# Patient Record
Sex: Female | Born: 1960 | Race: White | Hispanic: No | Smoking: Current every day smoker
Health system: Southern US, Community
[De-identification: ages and names within clinical notes are randomized; demographics above are authoritative.]

## PROBLEM LIST (undated history)

## (undated) ENCOUNTER — Emergency Department (HOSPITAL_COMMUNITY): Admission: EM | Source: Ambulatory Visit

## (undated) DIAGNOSIS — M549 Dorsalgia, unspecified: Secondary | ICD-10-CM

## (undated) DIAGNOSIS — G43909 Migraine, unspecified, not intractable, without status migrainosus: Secondary | ICD-10-CM

## (undated) DIAGNOSIS — R519 Headache, unspecified: Secondary | ICD-10-CM

## (undated) DIAGNOSIS — F32A Depression, unspecified: Secondary | ICD-10-CM

## (undated) DIAGNOSIS — M542 Cervicalgia: Secondary | ICD-10-CM

## (undated) DIAGNOSIS — Z87898 Personal history of other specified conditions: Secondary | ICD-10-CM

## (undated) DIAGNOSIS — F319 Bipolar disorder, unspecified: Secondary | ICD-10-CM

## (undated) DIAGNOSIS — F329 Major depressive disorder, single episode, unspecified: Secondary | ICD-10-CM

## (undated) DIAGNOSIS — I10 Essential (primary) hypertension: Secondary | ICD-10-CM

## (undated) DIAGNOSIS — F419 Anxiety disorder, unspecified: Secondary | ICD-10-CM

## (undated) DIAGNOSIS — G8929 Other chronic pain: Secondary | ICD-10-CM

## (undated) DIAGNOSIS — Z8659 Personal history of other mental and behavioral disorders: Secondary | ICD-10-CM

## (undated) DIAGNOSIS — F410 Panic disorder [episodic paroxysmal anxiety] without agoraphobia: Secondary | ICD-10-CM

## (undated) DIAGNOSIS — R51 Headache: Secondary | ICD-10-CM

## (undated) DIAGNOSIS — K219 Gastro-esophageal reflux disease without esophagitis: Secondary | ICD-10-CM

## (undated) DIAGNOSIS — G5603 Carpal tunnel syndrome, bilateral upper limbs: Secondary | ICD-10-CM

## (undated) DIAGNOSIS — K589 Irritable bowel syndrome without diarrhea: Secondary | ICD-10-CM

## (undated) HISTORY — PX: DILATION AND CURETTAGE OF UTERUS: SHX78

## (undated) HISTORY — PX: CARPAL TUNNEL RELEASE: SHX101

## (undated) HISTORY — PX: ABDOMINAL HYSTERECTOMY: SHX81

## (undated) HISTORY — PX: APPENDECTOMY: SHX54

## (undated) HISTORY — PX: ABDOMINAL SURGERY: SHX537

## (undated) HISTORY — PX: HERNIA REPAIR: SHX51

## (undated) HISTORY — PX: TUBAL LIGATION: SHX77

## (undated) HISTORY — PX: CHOLECYSTECTOMY: SHX55

---

## 2003-08-21 ENCOUNTER — Emergency Department (HOSPITAL_COMMUNITY): Admission: EM | Admit: 2003-08-21 | Discharge: 2003-08-21 | Payer: Self-pay | Admitting: Emergency Medicine

## 2004-07-28 ENCOUNTER — Ambulatory Visit (HOSPITAL_COMMUNITY): Admission: RE | Admit: 2004-07-28 | Discharge: 2004-07-28 | Payer: Self-pay | Admitting: Family Medicine

## 2004-08-06 ENCOUNTER — Ambulatory Visit (HOSPITAL_COMMUNITY): Admission: RE | Admit: 2004-08-06 | Discharge: 2004-08-06 | Payer: Self-pay | Admitting: Family Medicine

## 2004-09-01 ENCOUNTER — Other Ambulatory Visit: Admission: RE | Admit: 2004-09-01 | Discharge: 2004-09-01 | Payer: Self-pay | Admitting: Obstetrics and Gynecology

## 2004-09-21 ENCOUNTER — Inpatient Hospital Stay (HOSPITAL_COMMUNITY): Admission: RE | Admit: 2004-09-21 | Discharge: 2004-09-23 | Payer: Self-pay | Admitting: Obstetrics & Gynecology

## 2004-12-29 ENCOUNTER — Emergency Department (HOSPITAL_COMMUNITY): Admission: EM | Admit: 2004-12-29 | Discharge: 2004-12-29 | Payer: Self-pay | Admitting: Emergency Medicine

## 2006-04-28 ENCOUNTER — Emergency Department (HOSPITAL_COMMUNITY): Admission: EM | Admit: 2006-04-28 | Discharge: 2006-04-28 | Payer: Self-pay | Admitting: Emergency Medicine

## 2006-08-04 ENCOUNTER — Emergency Department (HOSPITAL_COMMUNITY): Admission: EM | Admit: 2006-08-04 | Discharge: 2006-08-04 | Payer: Self-pay | Admitting: Emergency Medicine

## 2007-04-21 ENCOUNTER — Emergency Department (HOSPITAL_COMMUNITY): Admission: EM | Admit: 2007-04-21 | Discharge: 2007-04-21 | Payer: Self-pay | Admitting: Emergency Medicine

## 2008-02-03 ENCOUNTER — Emergency Department (HOSPITAL_COMMUNITY): Admission: EM | Admit: 2008-02-03 | Discharge: 2008-02-03 | Payer: Self-pay | Admitting: Emergency Medicine

## 2008-02-18 ENCOUNTER — Emergency Department (HOSPITAL_COMMUNITY): Admission: EM | Admit: 2008-02-18 | Discharge: 2008-02-18 | Payer: Self-pay | Admitting: Emergency Medicine

## 2008-11-17 ENCOUNTER — Emergency Department (HOSPITAL_COMMUNITY): Admission: EM | Admit: 2008-11-17 | Discharge: 2008-11-17 | Payer: Self-pay | Admitting: Emergency Medicine

## 2008-12-22 ENCOUNTER — Emergency Department (HOSPITAL_COMMUNITY): Admission: EM | Admit: 2008-12-22 | Discharge: 2008-12-22 | Payer: Self-pay | Admitting: Emergency Medicine

## 2009-02-02 ENCOUNTER — Ambulatory Visit: Payer: Self-pay | Admitting: Orthopedic Surgery

## 2009-02-02 DIAGNOSIS — R209 Unspecified disturbances of skin sensation: Secondary | ICD-10-CM

## 2009-02-11 ENCOUNTER — Encounter: Payer: Self-pay | Admitting: Orthopedic Surgery

## 2009-02-11 ENCOUNTER — Encounter (INDEPENDENT_AMBULATORY_CARE_PROVIDER_SITE_OTHER): Payer: Self-pay | Admitting: *Deleted

## 2009-02-18 ENCOUNTER — Encounter: Payer: Self-pay | Admitting: Orthopedic Surgery

## 2009-03-04 ENCOUNTER — Ambulatory Visit: Payer: Self-pay | Admitting: Orthopedic Surgery

## 2009-03-04 DIAGNOSIS — G5600 Carpal tunnel syndrome, unspecified upper limb: Secondary | ICD-10-CM

## 2009-03-04 DIAGNOSIS — M5412 Radiculopathy, cervical region: Secondary | ICD-10-CM | POA: Insufficient documentation

## 2009-03-10 ENCOUNTER — Telehealth: Payer: Self-pay | Admitting: Orthopedic Surgery

## 2009-03-13 ENCOUNTER — Ambulatory Visit (HOSPITAL_COMMUNITY): Admission: RE | Admit: 2009-03-13 | Discharge: 2009-03-13 | Payer: Self-pay | Admitting: Orthopedic Surgery

## 2009-03-19 ENCOUNTER — Telehealth: Payer: Self-pay | Admitting: Orthopedic Surgery

## 2009-04-06 ENCOUNTER — Telehealth: Payer: Self-pay | Admitting: Orthopedic Surgery

## 2009-04-17 ENCOUNTER — Encounter: Admission: RE | Admit: 2009-04-17 | Discharge: 2009-04-17 | Payer: Self-pay | Admitting: Orthopedic Surgery

## 2009-04-20 ENCOUNTER — Telehealth: Payer: Self-pay | Admitting: Orthopedic Surgery

## 2009-05-01 ENCOUNTER — Encounter: Admission: RE | Admit: 2009-05-01 | Discharge: 2009-05-01 | Payer: Self-pay | Admitting: Orthopedic Surgery

## 2009-05-18 ENCOUNTER — Ambulatory Visit: Payer: Self-pay | Admitting: Orthopedic Surgery

## 2009-05-18 ENCOUNTER — Encounter (INDEPENDENT_AMBULATORY_CARE_PROVIDER_SITE_OTHER): Payer: Self-pay | Admitting: *Deleted

## 2009-05-19 ENCOUNTER — Telehealth: Payer: Self-pay | Admitting: Orthopedic Surgery

## 2009-05-21 ENCOUNTER — Telehealth: Payer: Self-pay | Admitting: Orthopedic Surgery

## 2009-05-27 ENCOUNTER — Telehealth: Payer: Self-pay | Admitting: Orthopedic Surgery

## 2009-06-03 ENCOUNTER — Encounter (INDEPENDENT_AMBULATORY_CARE_PROVIDER_SITE_OTHER): Payer: Self-pay | Admitting: *Deleted

## 2009-06-18 ENCOUNTER — Encounter: Payer: Self-pay | Admitting: Orthopedic Surgery

## 2009-06-19 ENCOUNTER — Ambulatory Visit (HOSPITAL_COMMUNITY): Admission: RE | Admit: 2009-06-19 | Discharge: 2009-06-19 | Payer: Self-pay | Admitting: Orthopedic Surgery

## 2009-06-19 ENCOUNTER — Ambulatory Visit: Payer: Self-pay | Admitting: Orthopedic Surgery

## 2009-06-23 ENCOUNTER — Ambulatory Visit: Payer: Self-pay | Admitting: Orthopedic Surgery

## 2009-06-30 ENCOUNTER — Ambulatory Visit: Payer: Self-pay | Admitting: Orthopedic Surgery

## 2009-07-02 ENCOUNTER — Ambulatory Visit: Payer: Self-pay | Admitting: Orthopedic Surgery

## 2009-07-30 ENCOUNTER — Ambulatory Visit: Payer: Self-pay | Admitting: Orthopedic Surgery

## 2009-07-30 DIAGNOSIS — M25549 Pain in joints of unspecified hand: Secondary | ICD-10-CM

## 2009-07-30 DIAGNOSIS — M7150 Other bursitis, not elsewhere classified, unspecified site: Secondary | ICD-10-CM

## 2009-07-31 ENCOUNTER — Telehealth: Payer: Self-pay | Admitting: Orthopedic Surgery

## 2009-08-05 ENCOUNTER — Encounter (HOSPITAL_COMMUNITY): Admission: RE | Admit: 2009-08-05 | Discharge: 2009-09-04 | Payer: Self-pay | Admitting: Orthopedic Surgery

## 2009-08-05 ENCOUNTER — Encounter: Payer: Self-pay | Admitting: Orthopedic Surgery

## 2009-08-27 ENCOUNTER — Ambulatory Visit: Payer: Self-pay | Admitting: Orthopedic Surgery

## 2009-08-27 DIAGNOSIS — M7710 Lateral epicondylitis, unspecified elbow: Secondary | ICD-10-CM | POA: Insufficient documentation

## 2009-09-01 ENCOUNTER — Encounter: Payer: Self-pay | Admitting: Orthopedic Surgery

## 2009-09-01 ENCOUNTER — Telehealth: Payer: Self-pay | Admitting: Orthopedic Surgery

## 2009-09-03 ENCOUNTER — Encounter: Payer: Self-pay | Admitting: Orthopedic Surgery

## 2009-09-08 ENCOUNTER — Telehealth: Payer: Self-pay | Admitting: Orthopedic Surgery

## 2009-09-14 ENCOUNTER — Encounter: Payer: Self-pay | Admitting: Orthopedic Surgery

## 2009-09-17 ENCOUNTER — Ambulatory Visit: Payer: Self-pay | Admitting: Orthopedic Surgery

## 2009-10-14 ENCOUNTER — Encounter: Payer: Self-pay | Admitting: Orthopedic Surgery

## 2009-10-19 ENCOUNTER — Emergency Department (HOSPITAL_COMMUNITY): Admission: EM | Admit: 2009-10-19 | Discharge: 2009-10-19 | Payer: Self-pay | Admitting: Emergency Medicine

## 2009-11-05 ENCOUNTER — Telehealth: Payer: Self-pay | Admitting: Orthopedic Surgery

## 2009-12-01 ENCOUNTER — Encounter: Payer: Self-pay | Admitting: Orthopedic Surgery

## 2009-12-09 ENCOUNTER — Ambulatory Visit: Payer: Self-pay | Admitting: Orthopedic Surgery

## 2009-12-22 ENCOUNTER — Encounter: Payer: Self-pay | Admitting: Orthopedic Surgery

## 2009-12-23 ENCOUNTER — Telehealth: Payer: Self-pay | Admitting: Orthopedic Surgery

## 2009-12-30 ENCOUNTER — Encounter: Payer: Self-pay | Admitting: Orthopedic Surgery

## 2010-01-14 ENCOUNTER — Encounter: Payer: Self-pay | Admitting: Orthopedic Surgery

## 2010-02-08 ENCOUNTER — Ambulatory Visit: Payer: Self-pay | Admitting: Orthopedic Surgery

## 2010-02-10 ENCOUNTER — Encounter: Payer: Self-pay | Admitting: Orthopedic Surgery

## 2010-03-25 ENCOUNTER — Telehealth: Payer: Self-pay | Admitting: Orthopedic Surgery

## 2010-03-31 ENCOUNTER — Telehealth: Payer: Self-pay | Admitting: Orthopedic Surgery

## 2010-11-12 ENCOUNTER — Emergency Department (HOSPITAL_COMMUNITY)
Admission: EM | Admit: 2010-11-12 | Discharge: 2010-11-12 | Payer: Self-pay | Source: Home / Self Care | Admitting: Emergency Medicine

## 2010-11-15 LAB — COMPREHENSIVE METABOLIC PANEL
ALT: 23 U/L (ref 0–35)
AST: 20 U/L (ref 0–37)
Albumin: 4.2 g/dL (ref 3.5–5.2)
Alkaline Phosphatase: 83 U/L (ref 39–117)
BUN: 13 mg/dL (ref 6–23)
CO2: 25 mEq/L (ref 19–32)
Calcium: 9.4 mg/dL (ref 8.4–10.5)
Chloride: 105 mEq/L (ref 96–112)
Creatinine, Ser: 0.99 mg/dL (ref 0.4–1.2)
GFR calc Af Amer: >60 mL/min (ref >60)
GFR calc non Af Amer: 59 mL/min — ABNORMAL LOW (ref >60)
Glucose, Bld: 119 mg/dL — ABNORMAL HIGH (ref 70–99)
Potassium: 4.1 mEq/L (ref 3.5–5.1)
Sodium: 141 mEq/L (ref 135–145)
Total Bilirubin: 0.4 mg/dL (ref 0.3–1.2)
Total Protein: 7.7 g/dL (ref 6.0–8.3)

## 2010-11-15 LAB — CBC
HCT: 45.6 % (ref 36.0–46.0)
Hemoglobin: 15.6 g/dL — ABNORMAL HIGH (ref 12.0–15.0)
MCH: 31.5 pg (ref 26.0–34.0)
MCHC: 34.2 g/dL (ref 30.0–36.0)
MCV: 92.1 fL (ref 78.0–100.0)
Platelets: 254 10*3/uL (ref 150–400)
RBC: 4.95 MIL/uL (ref 3.87–5.11)
RDW: 13.2 % (ref 11.5–15.5)
WBC: 15.8 10*3/uL — ABNORMAL HIGH (ref 4.0–10.5)

## 2010-11-15 LAB — POCT CARDIAC MARKERS
CKMB, poc: <1 ng/mL — ABNORMAL LOW (ref 1.0–8.0)
Myoglobin, poc: 44.1 ng/mL (ref 12–200)
Troponin i, poc: <0.05 ng/mL (ref 0.00–0.09)

## 2010-11-15 LAB — DIFFERENTIAL
Basophils Absolute: 0.2 10*3/uL — ABNORMAL HIGH (ref 0.0–0.1)
Basophils Relative: 1 % (ref 0–1)
Eosinophils Absolute: 0.5 10*3/uL (ref 0.0–0.7)
Eosinophils Relative: 3 % (ref 0–5)
Lymphocytes Relative: 33 % (ref 12–46)
Lymphs Abs: 5.2 10*3/uL — ABNORMAL HIGH (ref 0.7–4.0)
Monocytes Absolute: 0.9 10*3/uL (ref 0.1–1.0)
Monocytes Relative: 6 % (ref 3–12)
Neutro Abs: 9 10*3/uL — ABNORMAL HIGH (ref 1.7–7.7)
Neutrophils Relative %: 57 % (ref 43–77)

## 2010-11-15 LAB — D-DIMER, QUANTITATIVE (NOT AT ARMC): D-Dimer, Quant: 0.6 ug/mL-FEU — ABNORMAL HIGH (ref 0.00–0.48)

## 2010-11-22 ENCOUNTER — Encounter: Payer: Self-pay | Admitting: Orthopedic Surgery

## 2010-11-30 NOTE — Letter (Signed)
Summary: *Orthopedic No Show Letter  [facility]  [address]  [place], [place] [address]   Phone: [phone]  Fax: [phone]    12/01/2009   Jodel Mayhall [address] [place], [place]  [address]    Dear Ms. Tanton,   Our records indicate that you missed your scheduled appointment with Dr. Beaulah Corin on Thursday 11/19/2009.   Please contact this office to reschedule your appointment as soon as possible.  It is important that you keep your scheduled appointments with your physician, so we can provide you the best care possible.  We have enclosed an appointment card for your convenience.      Sincerely,    Dr. Terrance Mass, MD [facility] Phone [phone]

## 2010-11-30 NOTE — Progress Notes (Signed)
Summary: norco 5 for 2 weeks  Phone Note Call from Patient Call back at [facility] Phone [phone]   Summary of Call: wants refill on hydro 7.5/650 ok or not Initial call taken by: Ether Griffins,  March 31, 2010 9:03 AM  Follow-up for Phone Call        5mg  only  Follow-up by: Fuller Canada MD,  March 31, 2010 9:50 AM  Additional Follow-up for Phone Call Additional follow up Details #1::        ok faxed over Additional Follow-up by: Ether Griffins,  March 31, 2010 9:57 AM    New/Updated Medications: NORCO 5-325 MG TABS (HYDROCODONE-ACETAMINOPHEN) one by mouth q 4 hrs as needed pain Prescriptions: NORCO 5-325 MG TABS (HYDROCODONE-ACETAMINOPHEN) one by mouth q 4 hrs as needed pain  #42 x 1   Entered by:   Ether Griffins   Authorized by:   Fuller Canada MD   Signed by:   Ether Griffins on 03/31/2010   Method used:   Historical   RxID:   [id number]

## 2010-11-30 NOTE — Letter (Signed)
Summary: Medical record request [employer] [employer]  Medical record request [employer]   Imported By: Cammie Sickle 02/25/2010 19:22:56  _____________________________________________________________________  External Attachment:    Type:   Image     Comment:   External Document

## 2010-11-30 NOTE — Consult Note (Signed)
Summary: Consult [name]  Consult [name]   Imported By: Cammie Sickle 11/03/2009 10:27:36  _____________________________________________________________________  External Attachment:    Type:   Image     Comment:   External Document

## 2010-11-30 NOTE — Letter (Signed)
Summary: Medical record request Disab Determination  Medical record request Disab Determination   Imported By: Cammie Sickle 02/08/2010 11:50:18  _____________________________________________________________________  External Attachment:    Type:   Image     Comment:   External Document

## 2010-11-30 NOTE — Letter (Signed)
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

## 2010-11-30 NOTE — Assessment & Plan Note (Signed)
Summary: RE-CHECK CT/POSS INJEC/SURG AUG 2010/BCBS/CAF   Visit Type:  Follow-up Referring Provider:  self  CC:  post op ctr.  History of Present Illness: I saw Katelyn Kelly in the office today for a followup visit.  She is a 50 years old woman with the complaint of: right hand and forearm pain.  DOS 06/19/09 bilateral CTR,   pain with right hand and forearm.  DX TENNIS ELBOW       CTS        Cervical spondylosis   Medication Norco 7.5 and Diclofenac. Lyrica was too expensive to get. Neurontin has been tried also one by mouth three times a day 100mg  no relief.  Had tennis elbow injection and bracing last visit.  Has cervical radiculitis sees Katelyn Kelly.  Says "nothing is right" she still has pain and numbness both hand, gripping is weak also.  Injection helped elbow.               Allergies: 1)  ! Methotrexate 2)  ! * Tranzine 3)  ! * Syniquine 4)  ! * Lorcet Plus  Review of Systems Neuro:  cervical radiculitis, continued numbness both hands..  Physical Exam  Additional Exam:  Katelyn Kelly looks to be very depressed today she is oriented x3 I did not detect any abnormality in her hand there is no swelling the incisions look fine her range of motion is normal and the hand and wrist she continues to have tingling sensations she does have cervical radiculopathy I think her carpal tunnel has been addressed and nothing else can really be done about that.  Her neurosurgeon has not recommended any surgery so she is basically going to have to live with what she's got I did advise her take some Neurontin 200 mg t.i.d.  Further examination is normal pulse and perfusion no swelling no tenderness.     Impression & Recommendations:  Problem # 1:  CERVICAL RADICULITIS (ICD-723.4) Assessment Comment Only neurosurgeon did not advise any surgery  Problem # 2:  CARPAL TUNNEL SYNDROME (ICD-354.0) Assessment: Comment Only  carpal tunnel as been addressed surgically  continue with Neurontin 200 t.i.d. return to months  Orders: Est. Patient Level III (04540)  Medications Added to Medication List This Visit: 1)  Gabapentin 100 Mg Caps (Gabapentin) .... 2 tablets q 8 hrs prn  Patient Instructions: 1)  Please try Neurontin 200mg  three times a day 2)  Come back in  3)  Please schedule a follow-up appointment in 2 months. Prescriptions: GABAPENTIN 100 MG CAPS (GABAPENTIN) 2 tablets q 8 hrs prn  #60 x 2   Entered and Authorized by:   Fuller Canada MD   Signed by:   Fuller Canada MD on 12/09/2009   Method used:   Faxed to ...       [facility]  [address] [facility] (retail)       [address]       [place], [place]  [address]       Ph: [phone]       Fax: [phone]   RxID:   [id number]

## 2010-11-30 NOTE — Assessment & Plan Note (Signed)
Summary: 2 M RE-CK HANDS/BCBS/CAF   Visit Type:  Follow-up Referring Provider:  self  CC:  hands.  History of Present Illness: I saw Katelyn Kelly in the office today for a 2 month followup visit.  She is a 50 years old woman with the complaint of:  difficulties with her hands status post RIGHT and a LEFT carpal tunnel release.  She also has a history of cervical disc disease. also had injection for tennis elbow on the RIGHT seemed to help  Complaints today are my hands are not RIGHT they feel funny.  They lock up.  I can't hold things drop things.  Examination reveals that her hands are normal in terms of appearance there's no swelling she has some tenderness over the incision on the RIGHT.  She has decreased sensation in both hands.  She has weak grip strength.   Medication Norco 7.5 and  Gabapentin 100 Mg Caps (Gabapentin) .... 2 tablets q 8 hrs as needed.  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  She's had carpal tunnel releases.  She still having symptoms.  There is no indication at the carpal tunnel releases were incomplete.  I suppose her nerve study would be helpful at this point but repeat carpal tunnel releases are not recommend    Allergies: 1)  ! Methotrexate 2)  ! * Tranzine 3)  ! * Syniquine 4)  ! * Lorcet Plus   Other Orders: Est. Patient Level II (45409)  Patient Instructions: 1)  Please schedule a follow-up appointment as needed.

## 2010-11-30 NOTE — Letter (Signed)
Summary: Medical record request [employer]  Medical record request [employer]   Imported By: Cammie Sickle 02/08/2010 13:54:43  _____________________________________________________________________  External Attachment:    Type:   Image     Comment:   External Document

## 2010-11-30 NOTE — Progress Notes (Signed)
Summary: call from patient req appointment  Phone Note Call from Patient   Caller: Patient Summary of Call: Patient called to request appt for hand/wrist pain. States "related to the work comp," - which was filed after her surgery and after post-op care.   Asking if not appointment, any other recommendations? Initial call taken by: Cammie Sickle,  Mar 25, 2010 11:23 AM  Follow-up for Phone Call        is this related to the surgery or her neck ?  surgery f/u here   neck no f/u here will need to see the neuro surg  Follow-up by: Fuller Canada MD,  Mar 25, 2010 12:49 PM  Additional Follow-up for Phone Call Additional follow up Details #1::        called patient and advised.  States her hands "lock up", worst is when she wakes up in the morning. Offered appointment, and relayed that it would be under self pay, due to work comp claim not approved at this time. Patient said that she has a hearing scheduled 04/14/10, and will check with her advisor about work comp status and need for appointment. Additional Follow-up by: Cammie Sickle,  Mar 25, 2010 2:16 PM

## 2010-11-30 NOTE — Progress Notes (Signed)
Summary: changed med to norco 7.5  Phone Note Call from Patient Call back at [facility] Phone [phone]   Summary of Call: wants refill on Lorcet 10/650 ok or not Initial call taken by: Ether Griffins,  November 05, 2009 2:37 PM  Follow-up for Phone Call        medication change   norco 7.5 1 q 4 as needed pain # 84 refills 3   lyrica 50 mg 1 by mouth three times a day   diclofenac 50 mg two times a day  Follow-up by: Fuller Canada MD,  November 06, 2009 2:03 PM    New/Updated Medications: NORCO 7.5-325 MG TABS (HYDROCODONE-ACETAMINOPHEN) one by mouth q 4 hrs as needed pain LYRICA 50 MG CAPS (PREGABALIN) one by mouth tid DICLOFENAC SODIUM 50 MG TBEC (DICLOFENAC SODIUM) one by mouth bid Prescriptions: NORCO 7.5-325 MG TABS (HYDROCODONE-ACETAMINOPHEN) one by mouth q 4 hrs as needed pain  #84 x 3   Entered by:   Ether Griffins   Authorized by:   Fuller Canada MD   Signed by:   Ether Griffins on 11/09/2009   Method used:   Telephoned to ...       [facility] [address] [facility] ([facility]ail)       [address]       [place], [place]  [address]       Ph: [phone]       Fax: [phone]   RxID:   [id number] DICLOFENAC SODIUM 50 MG TBEC (DICLOFENAC SODIUM) one by mouth bid  #60 x 1   Entered by:   Ether Griffins   Authorized by:   Fuller Canada MD   Signed by:   Ether Griffins on 11/09/2009   Method used:   Telephoned to ...       [facility] [address] [facility]ail)       [address]       [place], [place]  [address]       Ph: [phone]       Fax: [phone]   RxID:   [id number] LYRICA 50 MG CAPS (PREGABALIN) one by mouth tid  #90 x 1   Entered by:   Ether Griffins   Authorized by:   Fuller Canada MD   Signed by:   Ether Griffins on 11/09/2009   Method used:   Telephoned to ...       [facility] [address] [facility] ([facility]ail)       [address]       [place], [place]   [address]       Ph: [phone]       Fax: [phone]   RxID:   [id number]

## 2010-11-30 NOTE — Progress Notes (Signed)
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 Auth from patient is dated 12/07/08, Vs.12/07/09.  Contacted [employer] to verify that records request and Berkley Harvey is for 12/07/09, current. [employer] ph [phone], Fax [phone]. Initial call taken by: Cammie Sickle,  December 23, 2009 3:19 PM  Follow-up for Phone Call        Per response from [facility] Work Comp, no additional auth needed for records request to be done. Follow-up by: Cammie Sickle,  December 24, 2009 9:20 AM

## 2010-11-30 NOTE — Letter (Signed)
Summary: [facility]  [address]  [place], [place] [address]   Phone: [phone]  Fax: [phone]     Today's Date: December 09, 2009  Name of Patient: Katelyn Kelly  The above named patient had a medical visit today at:  11:30am    Special Instructions:   [Next scheduled appointment is on 02/08/10 at 11:30am.]_____   Sincerely yours,   Terrance Mass, MD

## 2011-01-31 LAB — CBC
Platelets: 223 10*3/uL (ref 150–400)
WBC: 10.5 10*3/uL (ref 4.0–10.5)

## 2011-01-31 LAB — DIFFERENTIAL
Basophils Absolute: 0.1 10*3/uL (ref 0.0–0.1)
Basophils Relative: 1 % (ref 0–1)
Eosinophils Relative: 3 % (ref 0–5)
Lymphocytes Relative: 34 % (ref 12–46)
Monocytes Absolute: 0.6 10*3/uL (ref 0.1–1.0)
Monocytes Relative: 6 % (ref 3–12)

## 2011-01-31 LAB — URINALYSIS, ROUTINE W REFLEX MICROSCOPIC
Nitrite: NEGATIVE
Protein, ur: NEGATIVE mg/dL
Urobilinogen, UA: 0.2 mg/dL (ref 0.0–1.0)

## 2011-01-31 LAB — COMPREHENSIVE METABOLIC PANEL
AST: 26 U/L (ref 0–37)
Albumin: 2.9 g/dL — ABNORMAL LOW (ref 3.5–5.2)
Alkaline Phosphatase: 95 U/L (ref 39–117)
Chloride: 106 mEq/L (ref 96–112)
GFR calc Af Amer: >60 mL/min (ref >60)
Potassium: 3.2 mEq/L — ABNORMAL LOW (ref 3.5–5.1)
Total Bilirubin: 0.3 mg/dL (ref 0.3–1.2)

## 2011-02-05 LAB — BASIC METABOLIC PANEL
BUN: 4 mg/dL — ABNORMAL LOW (ref 6–23)
Chloride: 107 mEq/L (ref 96–112)
Creatinine, Ser: 0.68 mg/dL (ref 0.4–1.2)
Glucose, Bld: 123 mg/dL — ABNORMAL HIGH (ref 70–99)

## 2011-02-05 LAB — HEMOGLOBIN AND HEMATOCRIT, BLOOD: Hemoglobin: 15.3 g/dL — ABNORMAL HIGH (ref 12.0–15.0)

## 2011-02-15 LAB — CBC
HCT: 41.2 % (ref 36.0–46.0)
Hemoglobin: 14.2 g/dL (ref 12.0–15.0)
MCHC: 34.5 g/dL (ref 30.0–36.0)
MCV: 95.3 fL (ref 78.0–100.0)
Platelets: 246 10*3/uL (ref 150–400)
RDW: 13.7 % (ref 11.5–15.5)

## 2011-02-15 LAB — POCT CARDIAC MARKERS
CKMB, poc: <1 ng/mL — ABNORMAL LOW (ref 1.0–8.0)
Troponin i, poc: <0.05 ng/mL (ref 0.00–0.09)

## 2011-02-15 LAB — DIFFERENTIAL
Basophils Absolute: 0.1 10*3/uL (ref 0.0–0.1)
Basophils Relative: 1 % (ref 0–1)
Eosinophils Absolute: 0.4 10*3/uL (ref 0.0–0.7)
Eosinophils Relative: 4 % (ref 0–5)
Monocytes Absolute: 0.6 10*3/uL (ref 0.1–1.0)

## 2011-02-15 LAB — BASIC METABOLIC PANEL
BUN: 14 mg/dL (ref 6–23)
CO2: 24 mEq/L (ref 19–32)
GFR calc non Af Amer: >60 mL/min (ref >60)
Glucose, Bld: 215 mg/dL — ABNORMAL HIGH (ref 70–99)
Potassium: 3.2 mEq/L — ABNORMAL LOW (ref 3.5–5.1)

## 2011-03-15 NOTE — Op Note (Signed)
NAME:  Katelyn Kelly, Katelyn Kelly             ACCOUNT NO.:  [id number]   MEDICAL RECORD NO.:  [id number]          PATIENT TYPE:  AMB   LOCATION:  DAY                           FACILITY:  [facility]   PHYSICIAN:  Vickki Hearing, M.D.DATE OF BIRTH:  05-05-1961   DATE OF PROCEDURE:  06/19/2009  DATE OF DISCHARGE:                               OPERATIVE REPORT   HISTORY:  She is a 50 year old female, complained of tingling and  numbness with pain and swelling, stiffness of both hands, eventually had  evaluation for carpal tunnel, and was found have bilateral carpal tunnel  syndrome.  Initial treatment was with bracing and oral medication and  she did not improve, and therefore she was offered surgery versus  continued nonoperative treatment, and she opted for surgical treatment.   PREOPERATIVE DIAGNOSIS:  Bilateral carpal tunnel syndrome.   POSTOPERATIVE DIAGNOSIS:  Bilateral carpal tunnel syndrome.   PROCEDURE:  Open bilateral carpal tunnel release.   SURGEON:  Vickki Hearing, MD   ASSISTANTS:  None.   ANESTHETIC:  General.   OPERATIVE FINDINGS:  Tight carpal tunnels in both wrists, no space-  occupying lesions.  No specimens.  Case was clean.   BLOOD LOSS:  Minimal to none.   COMPLICATIONS:  None.   TOURNIQUET:  250 mmHg on both arms.   PROCEDURE WAS DONE AS FOLLOWS:  Site marking was performed in the preop  area on both wrists.  The patient ID was checked and chart was updated.  She was taken to the operating for general anesthesia by LMA technique.  She was given a gram of Ancef.  Her right and left upper extremities  were both prepped and draped sterilely.   We addressed the right side first.  After time-out, we exsanguinated the  limb, elevated the tourniquet, made a straight incision over the carpal  tunnel using the radial side of the ring finger as a location for the  incision in line with the carpal tunnel.  We divided subcu tissue.  Blunt dissection was carried out to  the distal portion of the transverse  carpal ligament.  Blunt dissection was carried out beneath the ligament  and then the ligament was released.  The wound was irrigated.  The  carpal tunnel contents were inspected.  The nerve was compressed, but  had descent color.  There were no space-occupying lesions.  We irrigated  and closed with a running 3-0 nylon suture, then injected 10 mL of  Marcaine without epinephrine, applied a sterile bandage.   The procedure was then repeated on the left side.   The patient was given Lorcet Plus 1 q.4 p.r.n. pain, #42 with 5 refill.  She is scheduled for a followup visit on Tuesday, June 23, 2009.      Vickki Hearing, M.D.  Electronically Signed     SEH/MEDQ  D:  06/19/2009  T:  06/19/2009  Job:  [id number]

## 2011-03-18 NOTE — Discharge Summary (Signed)
NAME:  Katelyn Kelly, Katelyn Kelly             ACCOUNT NO.:  [id number]   MEDICAL RECORD NO.:  [id number]          PATIENT TYPE:  INP   LOCATION:  A419                          FACILITY:  [facility]   PHYSICIAN:  Tilda Burrow, M.D. DATE OF BIRTH:  12/09/1960   DATE OF ADMISSION:  09/21/2004  DATE OF DISCHARGE:  11/24/2005LH                                 DISCHARGE SUMMARY   ADMITTING DIAGNOSES:  1.  Menometrorrhagia and dysmenorrhea.  2.  Moderate cervical dysplasia.  3.  Left lower-quadrant pain.   DISCHARGE DIAGNOSES:  1.  Menometrorrhagia and dysmenorrhea.  2.  Moderate cervical dysplasia.  3.  Left lower-quadrant pain.   PROCEDURE:  On September 21, 2004, total abdominal hysterectomy, left  salpingo-oophorectomy.   DISCHARGE MEDICATIONS:  1.  Xanax 0.5 mg p.o. t.i.d.  2.  Tylox one q.4h. p.r.n. pain.  3.  Benadryl 50 mg p.o. q.6h. p.r.n. itching.  4.  Climara 0.1 mg patch to skin twice weekly.   HOSPITAL SUMMARY:  This 50 year old female with a history of moderate  cervical dysplasia and menometrorrhagia, painful periods and left lower-  quadrant pain with a history of left ovarian cyst was admitted for abdominal  hysterectomy, left salpingo-oophorectomy as described in Dr. Forestine Chute  admitting history.  She underwent a hysterectomy with estimated blood loss  of 125 cc, and no complications.  See operative note for details.   Postoperatively, the patient had a relatively stable course other than mild  postoperative itching, treated with Benadryl.  She had an admitting  hemoglobin of 15.4 and hematocrit of 45.2.  Postoperative was 13.3 and 38.5  respectively.  White count was increased to 18,100, but she remained  afebrile.  Pathology report is pending at the time of this dictation.   DISCHARGE INSTRUCTIONS:  She is discharged on September 23, 2004, with  Benadryl for postoperative itching, and followup is scheduled for five days  after discharge for staple removal.     John   JVF/MEDQ  D:  09/23/2004  T:  09/23/2004  Job:  [id number]   cc:   [facility]

## 2011-03-18 NOTE — Op Note (Signed)
NAME:  Katelyn Kelly, Katelyn Kelly             ACCOUNT NO.:  [id number]   MEDICAL RECORD NO.:  [id number]          PATIENT TYPE:  AMB   LOCATION:  DAY                           FACILITY:  [facility]   PHYSICIAN:  Lazaro Arms, M.D.   DATE OF BIRTH:  06/19/1961   DATE OF PROCEDURE:  09/21/2004  DATE OF DISCHARGE:                                 OPERATIVE REPORT   PREOPERATIVE DIAGNOSES:  1.  Dysmenorrhea.  2.  Menometrorrhagia.  3.  Cervical dysplasia.  4.  Left lower quadrant pain.   POSTOPERATIVE DIAGNOSES:  1.  Dysmenorrhea.  2.  Menometrorrhagia.  3.  Cervical dysplasia.  4.  Left lower quadrant pain.   PROCEDURE:  Total abdominal hysterectomy, bilateral salpingo-oophorectomy.  Of note, the patient was under the impression she had previously had a right  salpingo-oophorectomy.  Her right ovary was still present.  As a result and  consistent with her wishes to not have any pelvic organs left, a TAH-BSO was  performed.   FINDINGS:  The patient was found to have a normal pelvis, normal uterus,  tubes, and ovaries.  Her ovaries were quite small, and they looked like they  were very definitely perimenopausal.  They were becoming definitely, having  a very atrophic external picture.   DESCRIPTION OF OPERATION:  The patient was taken to the operating room and  placed in the supine position and underwent general endotracheal anesthesia.  The vagina was prepped, a Foley catheter was placed.  The abdomen was  prepped and draped in the usual sterile fashion.  A Pfannenstiel skin  incision was made, carried down sharply to the rectus fascia, scored in the  midline, extended laterally.  The fascia was taken off the muscles  superiorly and inferiorly without difficulty.  The muscles were divided, the  peritoneal cavity was entered, and bladder blade was placed.  A Balfour self-  retaining retractor was placed.  The upper abdomen was packed away.  The  patient was placed in Trendelenburg position.   The uterine cornua were  grasped.  The left round ligament was suture ligated and cut.  The  infundibulopelvic ligament on the left was clamped, cut, and doubly suture  ligated.  The right round ligament was suture ligated and cut.  The  infundibulopelvic ligament on the right was suture ligated and cut.  The  vesicouterine serosal flap was created.  The bladder was pushed down off the  lower uterine segment.  The uterine vessels were skeletonized, clamped, cut,  and suture ligated.  Serial pedicles were taken down, the cervix to the  cardinal ligament, each pedicle being clamped, cut, transfixed, and suture  ligated.  The vaginal angle was encountered and clamped, cut, and suture  ligated and held.  The vagina was closed with interrupted figure-of-eight  sutures.  The patient tolerated the procedure well.  There was 125 mL of  blood loss.  The pelvis was irrigated vigorously and all pedicles were found  to be hemostatic.  Interceed was placed off the vaginal cuff to prevent  postoperative adhesions.  The Balfour self-retaining retractor and the packs  were removed.  The muscle was  reapproximated loosely.  The fascia closed using 0 Vicryl running,  subcutaneous tissue made hemostatic and irrigated.  The skin was closed  using skin staples.  The patient tolerated the procedure well.  She  experienced 125 mL of blood loss and was taken to the recovery room in good,  stable condition, all counts correct x3.     Luth   LHE/MEDQ  D:  09/21/2004  T:  09/21/2004  Job:  [id number]

## 2011-07-26 LAB — URINALYSIS, ROUTINE W REFLEX MICROSCOPIC
Nitrite: NEGATIVE
Specific Gravity, Urine: 1.01
Urobilinogen, UA: 0.2
pH: 6.5

## 2011-10-21 ENCOUNTER — Emergency Department (HOSPITAL_COMMUNITY): Payer: Self-pay

## 2011-10-21 ENCOUNTER — Emergency Department (HOSPITAL_COMMUNITY)
Admission: EM | Admit: 2011-10-21 | Discharge: 2011-10-21 | Disposition: A | Discharge status: Home / Self Care | Payer: Self-pay | Attending: Emergency Medicine | Admitting: Emergency Medicine

## 2011-10-21 ENCOUNTER — Encounter: Payer: Self-pay | Admitting: Emergency Medicine

## 2011-10-21 DIAGNOSIS — F319 Bipolar disorder, unspecified: Secondary | ICD-10-CM | POA: Insufficient documentation

## 2011-10-21 DIAGNOSIS — Z9889 Other specified postprocedural states: Secondary | ICD-10-CM | POA: Insufficient documentation

## 2011-10-21 DIAGNOSIS — Z9079 Acquired absence of other genital organ(s): Secondary | ICD-10-CM | POA: Insufficient documentation

## 2011-10-21 DIAGNOSIS — J189 Pneumonia, unspecified organism: Secondary | ICD-10-CM | POA: Insufficient documentation

## 2011-10-21 HISTORY — DX: Bipolar disorder, unspecified: F31.9

## 2011-10-21 LAB — POCT I-STAT, CHEM 8
Calcium, Ion: 1.16 mmol/L (ref 1.12–1.32)
Chloride: 102 mEq/L (ref 96–112)
Glucose, Bld: 118 mg/dL — ABNORMAL HIGH (ref 70–99)
HCT: 44 % (ref 36.0–46.0)
Hemoglobin: 15 g/dL (ref 12.0–15.0)
Potassium: 4.3 mEq/L (ref 3.5–5.1)

## 2011-10-21 MED ORDER — KETOROLAC TROMETHAMINE 30 MG/ML IJ SOLN
30.0000 mg | Freq: Once | INTRAMUSCULAR | Status: AC
  Administered 2011-10-21: 30 mg via INTRAVENOUS
  Filled 2011-10-21: qty 1

## 2011-10-21 MED ORDER — GUAIFENESIN 100 MG/5ML PO LIQD: 100.0000 mg | ORAL | Status: DC | PRN

## 2011-10-21 MED ORDER — AZITHROMYCIN 250 MG PO TABS: 250.0000 mg | ORAL_TABLET | Freq: Every day | ORAL | Status: AC

## 2011-10-21 MED ORDER — HYDROCOD POLST-CHLORPHEN POLST 10-8 MG/5ML PO LQCR: 5.0000 mL | Freq: Two times a day (BID) | ORAL | Status: DC | PRN

## 2011-10-21 MED ORDER — SODIUM CHLORIDE 0.9 % IV BOLUS (SEPSIS)
1000.0000 mL | Freq: Once | INTRAVENOUS | Status: AC
  Administered 2011-10-21: 1000 mL via INTRAVENOUS

## 2011-10-21 MED ORDER — FENTANYL CITRATE 0.05 MG/ML IJ SOLN
100.0000 ug | Freq: Once | INTRAMUSCULAR | Status: AC
  Administered 2011-10-21: 100 ug via INTRAVENOUS
  Filled 2011-10-21: qty 2

## 2011-10-21 NOTE — ED Notes (Signed)
Patient is resting comfortably. 

## 2011-10-21 NOTE — ED Notes (Signed)
Family at bedside. 

## 2011-10-21 NOTE — ED Notes (Signed)
Patient given diet coke per request, EDP to room to talk to patient.

## 2011-10-21 NOTE — ED Provider Notes (Signed)
History   This chart was scribed for Nelia Shi, MD scribed by Magnus Sinning. The patient was seen in room [facility]12/[facility]12    CSN: [id number]  Arrival date & time 10/21/11  1040   First MD Initiated Contact with Patient 10/21/11 1147      Chief Complaint  Patient presents with  . Cough  . Generalized Body Aches  . Chills  . Nausea  . Emesis    (Consider location/radiation/quality/duration/timing/severity/associated sxs/prior treatment) HPI Katelyn Kelly is a 50 y.o. female who presents to the Emergency Department complaining of constant moderate myalgias w/ associated cough, chills,nausea, vomiting, fever, headache, and generalized abd pain upon palpation. Pt describes the myalgias as pain that runs from " head all the way to toes" and rates it 8/10 on the pain score. Pt reports that onset was 5 days ago and that she was seen yesterday and treated with medication without improvement. Pt states that she did not receive a flu shot and reports additionally that she works in a [employer], but has been unable to report to work due to her sickness.    Past Medical History  Diagnosis Date  . Manic depression     Past Surgical History  Procedure Date  . Cholecystectomy   . Abdominal hysterectomy   . Tubal ligation   . Appendectomy   . Abdominal surgery   . Carpal tunnel release     No family history on file.  History  Substance Use Topics  . Smoking status: Current Everyday Smoker    Types: Cigarettes  . Smokeless tobacco: Not on file  . Alcohol Use: No   Review of Systems  Constitutional: Positive for fever and chills.  Respiratory: Positive for cough.   Gastrointestinal: Positive for nausea and vomiting. Abdominal pain: With palpation.  Musculoskeletal: Positive for myalgias.  Neurological: Positive for headaches.  All other systems reviewed and are negative.    Allergies  Methotrexate  Home Medications  No current outpatient prescriptions on file.  BP 103/68   Pulse 89  Temp(Src) 97.5 F (36.4 C) (Oral)  Resp 20  Ht 5\' 2"  (1.575 m)  Wt 162 lb (73.483 kg)  BMI 29.63 kg/m2  SpO2 95%  Physical Exam  Nursing note and vitals reviewed. Constitutional: She is oriented to person, place, and time. She appears well-developed and well-nourished. No distress.  HENT:  Head: Normocephalic and atraumatic.       Mucous membranes sticky  Eyes: Pupils are equal, round, and reactive to light.  Neck: Normal range of motion.  Cardiovascular: Normal rate, regular rhythm, normal heart sounds and intact distal pulses.   Pulmonary/Chest: No respiratory distress. She has rales (Right base).  Abdominal: Normal appearance. She exhibits no distension. There is no rebound and no guarding.  Musculoskeletal: Normal range of motion.  Neurological: She is alert and oriented to person, place, and time. No cranial nerve deficit.  Skin: Skin is warm and dry. No rash noted.  Psychiatric: She has a normal mood and affect. Her behavior is normal.    ED Course  Procedures (including critical care time) DIAGNOSTIC STUDIES: Oxygen Saturation is 95% on room air, normal by my interpretation.    COORDINATION OF CARE:  Labs Reviewed  POCT I-STAT, CHEM 8 - Abnormal; Notable for the following:    Glucose, Bld 118 (*)    All other components within normal limits  I-STAT, CHEM 8   Dg Chest 2 View  10/21/2011  *RADIOLOGY REPORT*  Clinical Data: Fever.  Cough.  Short of breath.  CHEST - 2 VIEW  Comparison: 11/12/2010  Findings: The heart is chronically enlarged.  There is right lower lobe infiltrate consistent with bronchopneumonia.  Nodular shadow on the left lower chest which was probably present on the January chest radiograph.  Suggest reevaluation after clearing of pneumonia.  No effusions.  No heart failure.  No bony abnormality.  IMPRESSION: Right lower lobe pneumonia.  Pulmonary nodule left lower lung, probably present in January. Follow up on subsequent films.  Original  Report Authenticated By: Thomasenia Sales, M.D.     1. Community acquired pneumonia       MDM  I personally performed the services described in this documentation, which was scribed in my presence. The recorded information has been reviewed and considered.        Nelia Shi, MD 10/21/11 [phone]

## 2012-01-10 ENCOUNTER — Emergency Department (HOSPITAL_COMMUNITY)
Admission: EM | Admit: 2012-01-10 | Discharge: 2012-01-10 | Disposition: A | Discharge status: Home / Self Care | Payer: Self-pay | Attending: Emergency Medicine | Admitting: Emergency Medicine

## 2012-01-10 ENCOUNTER — Encounter (HOSPITAL_COMMUNITY): Payer: Self-pay

## 2012-01-10 ENCOUNTER — Emergency Department (HOSPITAL_COMMUNITY): Payer: Self-pay

## 2012-01-10 ENCOUNTER — Other Ambulatory Visit: Payer: Self-pay

## 2012-01-10 DIAGNOSIS — K449 Diaphragmatic hernia without obstruction or gangrene: Secondary | ICD-10-CM | POA: Insufficient documentation

## 2012-01-10 DIAGNOSIS — M542 Cervicalgia: Secondary | ICD-10-CM | POA: Insufficient documentation

## 2012-01-10 DIAGNOSIS — M549 Dorsalgia, unspecified: Secondary | ICD-10-CM | POA: Insufficient documentation

## 2012-01-10 DIAGNOSIS — R55 Syncope and collapse: Secondary | ICD-10-CM | POA: Insufficient documentation

## 2012-01-10 DIAGNOSIS — F17200 Nicotine dependence, unspecified, uncomplicated: Secondary | ICD-10-CM | POA: Insufficient documentation

## 2012-01-10 DIAGNOSIS — G8929 Other chronic pain: Secondary | ICD-10-CM | POA: Insufficient documentation

## 2012-01-10 DIAGNOSIS — R51 Headache: Secondary | ICD-10-CM | POA: Insufficient documentation

## 2012-01-10 DIAGNOSIS — E119 Type 2 diabetes mellitus without complications: Secondary | ICD-10-CM | POA: Insufficient documentation

## 2012-01-10 DIAGNOSIS — W1809XA Striking against other object with subsequent fall, initial encounter: Secondary | ICD-10-CM | POA: Insufficient documentation

## 2012-01-10 HISTORY — DX: Other chronic pain: G89.29

## 2012-01-10 HISTORY — DX: Panic disorder (episodic paroxysmal anxiety): F41.0

## 2012-01-10 HISTORY — DX: Personal history of other specified conditions: Z87.898

## 2012-01-10 HISTORY — DX: Depression, unspecified: F32.A

## 2012-01-10 HISTORY — DX: Cervicalgia: M54.2

## 2012-01-10 HISTORY — DX: Carpal tunnel syndrome, bilateral upper limbs: G56.03

## 2012-01-10 HISTORY — DX: Major depressive disorder, single episode, unspecified: F32.9

## 2012-01-10 HISTORY — DX: Anxiety disorder, unspecified: F41.9

## 2012-01-10 HISTORY — DX: Dorsalgia, unspecified: M54.9

## 2012-01-10 LAB — DIFFERENTIAL
Basophils Absolute: 0.1 10*3/uL (ref 0.0–0.1)
Basophils Relative: 1 % (ref 0–1)
Eosinophils Absolute: 0.2 10*3/uL (ref 0.0–0.7)
Eosinophils Relative: 2 % (ref 0–5)
Neutrophils Relative %: 61 % (ref 43–77)

## 2012-01-10 LAB — BASIC METABOLIC PANEL
CO2: 29 mEq/L (ref 19–32)
Calcium: 9.7 mg/dL (ref 8.4–10.5)
Creatinine, Ser: 0.95 mg/dL (ref 0.50–1.10)
GFR calc non Af Amer: 68 mL/min — ABNORMAL LOW (ref >90)
Glucose, Bld: 82 mg/dL (ref 70–99)

## 2012-01-10 LAB — URINALYSIS, ROUTINE W REFLEX MICROSCOPIC
Bilirubin Urine: NEGATIVE
Leukocytes, UA: NEGATIVE
Nitrite: NEGATIVE
Specific Gravity, Urine: <1.005 — ABNORMAL LOW (ref 1.005–1.030)
Urobilinogen, UA: 0.2 mg/dL (ref 0.0–1.0)

## 2012-01-10 LAB — CBC
MCH: 31.8 pg (ref 26.0–34.0)
MCHC: 34 g/dL (ref 30.0–36.0)
MCV: 93.6 fL (ref 78.0–100.0)
Platelets: 254 10*3/uL (ref 150–400)
RDW: 13.1 % (ref 11.5–15.5)

## 2012-01-10 LAB — D-DIMER, QUANTITATIVE: D-Dimer, Quant: 0.61 ug/mL-FEU — ABNORMAL HIGH (ref 0.00–0.48)

## 2012-01-10 MED ORDER — IOHEXOL 350 MG/ML SOLN
100.0000 mL | Freq: Once | INTRAVENOUS | Status: AC | PRN
  Administered 2012-01-10: 100 mL via INTRAVENOUS

## 2012-01-10 MED ORDER — SODIUM CHLORIDE 0.9 % IV SOLN
INTRAVENOUS | Status: DC
  Administered 2012-01-10: 19:00:00 via INTRAVENOUS

## 2012-01-10 MED ORDER — FENTANYL CITRATE 0.05 MG/ML IJ SOLN
50.0000 ug | INTRAMUSCULAR | Status: DC | PRN
  Administered 2012-01-10: 50 ug via INTRAVENOUS
  Filled 2012-01-10: qty 2

## 2012-01-10 MED ORDER — OXYCODONE-ACETAMINOPHEN 5-325 MG PO TABS: 1.0000 | ORAL_TABLET | ORAL | Status: AC | PRN

## 2012-01-10 MED ORDER — KETOROLAC TROMETHAMINE 30 MG/ML IJ SOLN
30.0000 mg | Freq: Once | INTRAMUSCULAR | Status: AC
  Administered 2012-01-10: 30 mg via INTRAVENOUS
  Filled 2012-01-10: qty 1

## 2012-01-10 MED ORDER — DIAZEPAM 5 MG PO TABS
5.0000 mg | ORAL_TABLET | Freq: Once | ORAL | Status: DC
  Filled 2012-01-10: qty 1

## 2012-01-10 NOTE — ED Notes (Signed)
Pt states she has been under a lot of stress at work. States she passed out twice since about 5 pm. Pt tearful on arrival to ed

## 2012-01-10 NOTE — ED Provider Notes (Signed)
Pt improved Ambulatory, no distress We reviewed lab/ct findings Doubt serious cardiac dysrhythmia Stable for d/c  Joya Gaskins, MD 01/10/12 2302

## 2012-01-10 NOTE — ED Notes (Signed)
Pt to CT

## 2012-01-10 NOTE — ED Provider Notes (Signed)
History     CSN: [id number]  Arrival date & time 01/10/12  1752   First MD Initiated Contact with Patient 01/10/12 1820      Chief Complaint  Patient presents with  . Loss of Consciousness     HPI Pt was seen at 1830.   Per pt, c/o sudden onset and resolution of 2 brief episodes of syncope that occurred PTA.  Pt states she was at work and "was under a lot of stress" when she then "fell to the floor and passed out."  Pt states she remembers "falling and hitting my head on the floor."  Pt states this occurred a 2nd time also.  Pt states she has a hx of "passing out when I get stressed."  Only c/o currently is posterior head, neck and back "pain."  Describes the neck and back pain as per her usual chronic pain patterns.  Denies prodromal symptoms before syncope, no AMS/confusion, no seizure activity, no incont of bowel or bladder, no CP/palpitations, no SOB/cough, no visual changes, no focal motor weakness, no tingling/numbness in extremities, no abd pain, no N/V/D.    Past Medical History  Diagnosis Date  . Manic depression   . Diabetes mellitus   . Chronic back pain   . Chronic neck pain   . H/O syncope   . Panic attack   . Bilateral carpal tunnel syndrome   . Anxiety and depression     Past Surgical History  Procedure Date  . Cholecystectomy   . Abdominal hysterectomy   . Tubal ligation   . Appendectomy   . Abdominal surgery   . Carpal tunnel release    History  Substance Use Topics  . Smoking status: Current Everyday Smoker    Types: Cigarettes  . Smokeless tobacco: Not on file  . Alcohol Use: No    Review of Systems ROS: Statement: All systems negative except as marked or noted in the HPI; Constitutional: Negative for fever and chills. ; ; Eyes: Negative for eye pain, redness and discharge. ; ; ENMT: Negative for ear pain, hoarseness, nasal congestion, sinus pressure and sore throat. ; ; Cardiovascular: Negative for chest pain, palpitations, diaphoresis, dyspnea and  peripheral edema. ; ; Respiratory: Negative for cough, wheezing and stridor. ; ; Gastrointestinal: Negative for nausea, vomiting, diarrhea, abdominal pain, blood in stool, hematemesis, jaundice and rectal bleeding. . ; ; Genitourinary: Negative for dysuria, flank pain and hematuria. ; ; Musculoskeletal: +back pain and neck pain. Negative for swelling and trauma.; ; Skin: Negative for pruritus, rash, abrasions, blisters, bruising and skin lesion.; ; Neuro: Negative for headache, lightheadedness and neck stiffness. Negative for weakness, altered level of consciousness , altered mental status, extremity weakness, paresthesias, involuntary movement, seizure and +syncope.; Psych:  No SI, no SA, no HI, no hallucinations, +anxious.    Allergies  Sinequan and Tranxene  Home Medications   Current Outpatient Rx  Name Route Sig Dispense Refill  . ALPRAZOLAM 1 MG PO TABS Oral Take 1 mg by mouth 4 (four) times daily as needed. For anxiety     . CITALOPRAM HYDROBROMIDE 20 MG PO TABS Oral Take 20 mg by mouth daily.      . IBUPROFEN 200 MG PO TABS Oral Take 600 mg by mouth every 6 (six) hours as needed. For pain     . LISINOPRIL 5 MG PO TABS Oral Take 5 mg by mouth daily.      Marland Kitchen METFORMIN HCL 500 MG PO TABS Oral Take 500 mg by  mouth 2 (two) times daily with a meal.      BP 107/61  Pulse 68  Temp(Src) 98.1 F (36.7 C) (Oral)  Resp 21  Ht 5\' 1"  (1.549 m)  Wt 162 lb (73.483 kg)  BMI 30.61 kg/m2  SpO2 95%  Physical Exam 1835: Physical examination:  Nursing notes reviewed; Vital signs and O2 SAT reviewed;  Constitutional: Well developed, Well nourished, Well hydrated, In no acute distress; Head:  Normocephalic, atraumatic; Eyes: EOMI, PERRL, No scleral icterus; ENMT: Mouth and pharynx normal, Mucous membranes moist; Neck: Supple, Full range of motion, No lymphadenopathy; Cardiovascular: Regular rate and rhythm, No murmur, rub, or gallop; Respiratory: Breath sounds clear & equal bilaterally, No rales,  rhonchi, wheezes, or rub, Normal respiratory effort/excursion; Chest: Nontender, Movement normal; Abdomen: Soft, Nontender, Nondistended, Normal bowel sounds; Genitourinary: No CVA tenderness; Spine:  No midline CS, TS, LS tenderness. +TTP bilat hypertonic trapezius muscles and bilat lumbar paraspinal muscles. No rash, no ecchymosis.; Extremities: Pulses normal, No tenderness, No edema, No calf edema or asymmetry.; Neuro: AA&Ox3, Major CN grossly intact. No facial droop, speech clear, normal coordination.  No gross focal motor or sensory deficits in extremities.; Skin: Color normal, Warm, Dry, no rash; Psych:  Anxious, tearful.    ED Course  Procedures   MDM  MDM Reviewed: nursing note and vitals Interpretation: x-ray, labs and CT scan    Date: 01/10/2012  Rate: 94  Rhythm: normal sinus rhythm  QRS Axis: normal  Intervals: normal  ST/T Wave abnormalities: normal  Conduction Disutrbances:none  Narrative Interpretation:   Old EKG Reviewed: changes noted; today's EKG with resolved prolonged QTc seen on previous EKG dated 12/22/2008.   Results for orders placed during the hospital encounter of 01/10/12  BASIC METABOLIC PANEL      Component Value Range   Sodium 136  135 - 145 (mEq/L)   Potassium 4.5  3.5 - 5.1 (mEq/L)   Chloride 100  96 - 112 (mEq/L)   CO2 29  19 - 32 (mEq/L)   Glucose, Bld 82  70 - 99 (mg/dL)   BUN 12  6 - 23 (mg/dL)   Creatinine, Ser 0.45  0.50 - 1.10 (mg/dL)   Calcium 9.7  8.4 - 40.9 (mg/dL)   GFR calc non Af Amer 68 (*) >90 (mL/min)   GFR calc Af Amer 78 (*) >90 (mL/min)  CBC      Component Value Range   WBC 13.9 (*) 4.0 - 10.5 (K/uL)   RBC 4.84  3.87 - 5.11 (MIL/uL)   Hemoglobin 15.4 (*) 12.0 - 15.0 (g/dL)   HCT 81.1  91.4 - 78.2 (%)   MCV 93.6  78.0 - 100.0 (fL)   MCH 31.8  26.0 - 34.0 (pg)   MCHC 34.0  30.0 - 36.0 (g/dL)   RDW 95.6  21.3 - 08.6 (%)   Platelets 254  150 - 400 (K/uL)  DIFFERENTIAL      Component Value Range   Neutrophils Relative 61   43 - 77 (%)   Neutro Abs 8.5 (*) 1.7 - 7.7 (K/uL)   Lymphocytes Relative 30  12 - 46 (%)   Lymphs Abs 4.2 (*) 0.7 - 4.0 (K/uL)   Monocytes Relative 7  3 - 12 (%)   Monocytes Absolute 0.9  0.1 - 1.0 (K/uL)   Eosinophils Relative 2  0 - 5 (%)   Eosinophils Absolute 0.2  0.0 - 0.7 (K/uL)   Basophils Relative 1  0 - 1 (%)   Basophils Absolute 0.1  0.0 - 0.1 (K/uL)   WBC Morphology ATYPICAL LYMPHOCYTES    D-DIMER, QUANTITATIVE      Component Value Range   D-Dimer, Quant 0.61 (*) 0.00 - 0.48 (ug/mL-FEU)  TROPONIN I      Component Value Range   Troponin I <0.30  <0.30 (ng/mL)   Dg Chest 2 View 01/10/2012  *RADIOLOGY REPORT*  Clinical Data: Fall with dizziness and weakness.  CHEST - 2 VIEW  Comparison: 10/21/2011  Findings: Bibasilar atelectasis present.  No overt edema or infiltrate.  No pleural fluid.  Stable mild cardiomegaly.  IMPRESSION: Bibasilar atelectasis.  Original Report Authenticated By: Reola Calkins, M.D.   Ct Head Wo Contrast 01/10/2012  *RADIOLOGY REPORT*  Clinical Data:  Passed out.  Fall.  History of diabetes, hypertension, manic depression.  CT HEAD WITHOUT CONTRAST CT CERVICAL SPINE WITHOUT CONTRAST  Technique:  Multidetector CT imaging of the head and cervical spine was performed following the standard protocol without intravenous contrast.  Multiplanar CT image reconstructions of the cervical spine were also generated.  Comparison:  11/12/2010  CT HEAD  Findings: There is no intra or extra-axial fluid collection or mass lesion.  The basilar cisterns and ventricles have a normal appearance.  There is no CT evidence for acute infarction or hemorrhage.  Bone windows show no acute finding.  IMPRESSION: Negative exam.  CT CERVICAL SPINE  Findings: There are degenerative changes in the cervical spine, most notable at C4-5, C5-6, C6-7.  There is canal stenosis at C6, which is felt to be degenerative.  There is loss of cervical lordosis.  This may be secondary to splinting, soft tissue  injury, or positioning.   There is no evidence for acute fracture or subluxation.  Lung apices are clear.  IMPRESSION:  1.  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 2.  Loss of lordosis. 3. No evidence for acute osseous abnormality.  Original Report Authenticated By: Patterson Hammersmith, M.D.   Ct Angio Chest W/cm &/or Wo Cm 01/10/2012  *RADIOLOGY REPORT*  Clinical Data: Syncope  CT ANGIOGRAPHY CHEST  Technique:  Multidetector CT imaging of the chest using the standard protocol during bolus administration of intravenous contrast. Multiplanar reconstructed images including MIPs were obtained and reviewed to evaluate the vascular anatomy.  Contrast: OMNIPAQUE IOHEXOL 350 MG/ML IV SOLN  Comparison: 11/12/2010  Findings: No filling defects in the pulmonary arterial tree to suggest acute pulmonary thromboembolism.  Negative abnormal mediastinal adenopathy.  Small nodes are noted.  No pericardial effusion  No pneumothorax and no pleural effusion  Dependent atelectasis in the right middle lobe and lingula.  Mosaic ground-glass pattern likely due to air trapping.  Peribronchial soft tissue thickening in the hilar regions.  Nodularity in both adrenal glands has a benign appearance.  Small hiatal hernia.  IMPRESSION: No evidence of acute pulmonary thromboembolism.  Bronchitic changes.  Hiatal hernia.  Original Report Authenticated By: Donavan Burnet, M.D.   Ct Cervical Spine Wo Contrast 01/10/2012  *RADIOLOGY REPORT*  Clinical Data:  Passed out.  Fall.  History of diabetes, hypertension, manic depression.  CT HEAD WITHOUT CONTRAST CT CERVICAL SPINE WITHOUT CONTRAST  Technique:  Multidetector CT imaging of the head and cervical spine was performed following the standard protocol without intravenous contrast.  Multiplanar CT image reconstructions of the cervical spine were also generated.  Comparison:  11/12/2010  CT HEAD  Findings: There is no intra or extra-axial fluid collection or mass lesion.  The basilar  cisterns and ventricles have a normal appearance.  There is no CT evidence for acute infarction or hemorrhage.  Bone windows show no acute finding.  IMPRESSION: Negative exam.  CT CERVICAL SPINE  Findings: There are degenerative changes in the cervical spine, most notable at C4-5, C5-6, C6-7.  There is canal stenosis at C6, which is felt to be degenerative.  There is loss of cervical lordosis.  This may be secondary to splinting, soft tissue injury, or positioning.   There is no evidence for acute fracture or subluxation.  Lung apices are clear.  IMPRESSION:  1.  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 2.  Loss of lordosis. 3. No evidence for acute osseous abnormality.  Original Report Authenticated By: Patterson Hammersmith, M.D.      10:28 PM:   Pt has just been ambulated to the BR to give urine sample; gait steady, resps easy.  Not orthostatic.  Medicated for her chronic neck and back pain.  Feels better, appears less anxious now.  Dx testing d/w pt and family.  Questions answered.  Verb understanding.  Will likely be able to be discharged.  Sign out to Dr. Bebe Shaggy to check on UA.         Laray Anger, DO 01/12/12 1407

## 2012-01-11 LAB — URINE CULTURE

## 2012-06-02 ENCOUNTER — Emergency Department (HOSPITAL_COMMUNITY)
Admission: EM | Admit: 2012-06-02 | Discharge: 2012-06-02 | Disposition: A | Discharge status: Home / Self Care | Payer: Self-pay | Attending: Emergency Medicine | Admitting: Emergency Medicine

## 2012-06-02 ENCOUNTER — Encounter (HOSPITAL_COMMUNITY): Payer: Self-pay | Admitting: *Deleted

## 2012-06-02 DIAGNOSIS — IMO0002 Reserved for concepts with insufficient information to code with codable children: Secondary | ICD-10-CM | POA: Insufficient documentation

## 2012-06-02 DIAGNOSIS — E119 Type 2 diabetes mellitus without complications: Secondary | ICD-10-CM | POA: Insufficient documentation

## 2012-06-02 DIAGNOSIS — F3289 Other specified depressive episodes: Secondary | ICD-10-CM | POA: Insufficient documentation

## 2012-06-02 DIAGNOSIS — L247 Irritant contact dermatitis due to plants, except food: Secondary | ICD-10-CM

## 2012-06-02 DIAGNOSIS — F17200 Nicotine dependence, unspecified, uncomplicated: Secondary | ICD-10-CM | POA: Insufficient documentation

## 2012-06-02 DIAGNOSIS — R05 Cough: Secondary | ICD-10-CM | POA: Insufficient documentation

## 2012-06-02 DIAGNOSIS — R059 Cough, unspecified: Secondary | ICD-10-CM | POA: Insufficient documentation

## 2012-06-02 DIAGNOSIS — L255 Unspecified contact dermatitis due to plants, except food: Secondary | ICD-10-CM | POA: Insufficient documentation

## 2012-06-02 DIAGNOSIS — F411 Generalized anxiety disorder: Secondary | ICD-10-CM | POA: Insufficient documentation

## 2012-06-02 DIAGNOSIS — Z90710 Acquired absence of both cervix and uterus: Secondary | ICD-10-CM | POA: Insufficient documentation

## 2012-06-02 DIAGNOSIS — F329 Major depressive disorder, single episode, unspecified: Secondary | ICD-10-CM | POA: Insufficient documentation

## 2012-06-02 DIAGNOSIS — Z9089 Acquired absence of other organs: Secondary | ICD-10-CM | POA: Insufficient documentation

## 2012-06-02 MED ORDER — DEXAMETHASONE SODIUM PHOSPHATE 10 MG/ML IJ SOLN
10.0000 mg | Freq: Once | INTRAMUSCULAR | Status: AC
  Administered 2012-06-02: 10 mg via INTRAMUSCULAR
  Filled 2012-06-02: qty 1

## 2012-06-02 MED ORDER — PREDNISONE 10 MG PO TABS: ORAL_TABLET | ORAL | Status: DC

## 2012-06-02 NOTE — ED Notes (Signed)
Pt states rash since Wednesday. Was pulling weeds day before rash occurred. Pt also states productive cough x 2 weeks, yellow in color. Pt states rash does not itch, but is painful.
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
## 2012-06-02 NOTE — ED Provider Notes (Signed)
Medical screening examination/treatment/procedure(s) were performed by non-physician practitioner and as supervising physician I was immediately available for consultation/collaboration.   Joya Gaskins, MD 06/02/12 1051

## 2013-07-28 ENCOUNTER — Emergency Department (HOSPITAL_COMMUNITY)
Admission: EM | Admit: 2013-07-28 | Discharge: 2013-07-28 | Disposition: A | Discharge status: Home / Self Care | Payer: Self-pay | Attending: Emergency Medicine | Admitting: Emergency Medicine

## 2013-07-28 ENCOUNTER — Encounter (HOSPITAL_COMMUNITY): Payer: Self-pay | Admitting: Emergency Medicine

## 2013-07-28 ENCOUNTER — Emergency Department (HOSPITAL_COMMUNITY): Payer: Self-pay

## 2013-07-28 DIAGNOSIS — Z79899 Other long term (current) drug therapy: Secondary | ICD-10-CM | POA: Insufficient documentation

## 2013-07-28 DIAGNOSIS — W1809XA Striking against other object with subsequent fall, initial encounter: Secondary | ICD-10-CM | POA: Insufficient documentation

## 2013-07-28 DIAGNOSIS — F17200 Nicotine dependence, unspecified, uncomplicated: Secondary | ICD-10-CM | POA: Insufficient documentation

## 2013-07-28 DIAGNOSIS — S40021A Contusion of right upper arm, initial encounter: Secondary | ICD-10-CM

## 2013-07-28 DIAGNOSIS — F319 Bipolar disorder, unspecified: Secondary | ICD-10-CM | POA: Insufficient documentation

## 2013-07-28 DIAGNOSIS — S0083XA Contusion of other part of head, initial encounter: Secondary | ICD-10-CM

## 2013-07-28 DIAGNOSIS — Y929 Unspecified place or not applicable: Secondary | ICD-10-CM | POA: Insufficient documentation

## 2013-07-28 DIAGNOSIS — S40029A Contusion of unspecified upper arm, initial encounter: Secondary | ICD-10-CM | POA: Insufficient documentation

## 2013-07-28 DIAGNOSIS — E119 Type 2 diabetes mellitus without complications: Secondary | ICD-10-CM | POA: Insufficient documentation

## 2013-07-28 DIAGNOSIS — Z791 Long term (current) use of non-steroidal anti-inflammatories (NSAID): Secondary | ICD-10-CM | POA: Insufficient documentation

## 2013-07-28 DIAGNOSIS — Y9389 Activity, other specified: Secondary | ICD-10-CM | POA: Insufficient documentation

## 2013-07-28 DIAGNOSIS — G8929 Other chronic pain: Secondary | ICD-10-CM | POA: Insufficient documentation

## 2013-07-28 DIAGNOSIS — F410 Panic disorder [episodic paroxysmal anxiety] without agoraphobia: Secondary | ICD-10-CM | POA: Insufficient documentation

## 2013-07-28 DIAGNOSIS — S0003XA Contusion of scalp, initial encounter: Secondary | ICD-10-CM | POA: Insufficient documentation

## 2013-07-28 MED ORDER — ONDANSETRON 4 MG PO TBDP
4.0000 mg | ORAL_TABLET | Freq: Once | ORAL | Status: AC
  Administered 2013-07-28: 4 mg via ORAL
  Filled 2013-07-28: qty 1

## 2013-07-28 MED ORDER — ONDANSETRON 4 MG PO TBDP: 4.0000 mg | ORAL_TABLET | Freq: Three times a day (TID) | ORAL | Status: DC | PRN

## 2013-07-28 MED ORDER — HYDROCODONE-ACETAMINOPHEN 5-325 MG PO TABS: 2.0000 | ORAL_TABLET | ORAL | Status: DC | PRN

## 2013-07-28 MED ORDER — NAPROXEN 500 MG PO TABS: 500.0000 mg | ORAL_TABLET | Freq: Two times a day (BID) | ORAL | Status: DC

## 2013-07-28 MED ORDER — OXYCODONE-ACETAMINOPHEN 5-325 MG PO TABS
2.0000 | ORAL_TABLET | Freq: Once | ORAL | Status: AC
  Administered 2013-07-28: 2 via ORAL
  Filled 2013-07-28: qty 2
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
## 2013-07-28 NOTE — ED Provider Notes (Signed)
CSN: [id number]     Arrival date & time 07/28/13  4098 History   First MD Initiated Contact with Patient 07/28/13 0255     Chief Complaint  Patient presents with  . Fall  . Facial Injury   (Consider location/radiation/quality/duration/timing/severity/associated sxs/prior Treatment) HPI Comments: Pt presents with - c/o a fall - this occurred sometime around 8 PM according to a friend who brought her here.  She is a known diabetic and states that while she was hanging pictures this evening, she thinks that she fell and hit her face on a stool - her friend found her on the ground with her head on the stool and woke her up - fed her (no way to check her CBG at home b/c she states she no longer has a meter).  She was able to eat, walk and help prepare a meal - the friend went home and she called him back b/c of worsening nausea - has had a couple episodes of vomiting.  She has a headache and has had swelling around her R eye which has gotten worse.  No change in vision, no CP, cough, sob.  She does have some pain around her R knee and her R shoulder.  This was acute in onset - she doesn't remember the fall but does remember that she was hanging pictures right before this occurred.  Patient is a 52 y.o. female presenting with fall and facial injury. The history is provided by the patient.  Fall  Facial Injury   Past Medical History  Diagnosis Date  . Manic depression   . Diabetes mellitus   . Chronic back pain   . Chronic neck pain   . H/O syncope   . Panic attack   . Bilateral carpal tunnel syndrome   . Anxiety and depression    Past Surgical History  Procedure Laterality Date  . Cholecystectomy    . Abdominal hysterectomy    . Tubal ligation    . Appendectomy    . Abdominal surgery    . Carpal tunnel release     No family history on file. History  Substance Use Topics  . Smoking status: Current Every Day Smoker    Types: Cigarettes  . Smokeless tobacco: Not on file  . Alcohol  Use: No   OB History   Grav Para Term Preterm Abortions TAB SAB Ect Mult Living                 Review of Systems  All other systems reviewed and are negative.    Allergies  Methylphenidate derivatives; Sinequan; and Tranxene  Home Medications   Current Outpatient Rx  Name  Route  Sig  Dispense  Refill  . ALPRAZolam (XANAX) 1 MG tablet   Oral   Take 1 mg by mouth 4 (four) times daily as needed. For anxiety          . aspirin 81 MG tablet   Oral   Take 81 mg by mouth daily.         . citalopram (CELEXA) 20 MG tablet   Oral   Take 20 mg by mouth daily.           Marland Kitchen ibuprofen (ADVIL,MOTRIN) 200 MG tablet   Oral   Take 600 mg by mouth every 6 (six) hours as needed. For pain          . lisinopril (PRINIVIL,ZESTRIL) 10 MG tablet   Oral   Take 10 mg by mouth  daily.         . metFORMIN (GLUCOPHAGE) 500 MG tablet   Oral   Take 500 mg by mouth 2 (two) times daily with a meal.         . HYDROcodone-acetaminophen (NORCO/VICODIN) 5-325 MG per tablet   Oral   Take 2 tablets by mouth every 4 (four) hours as needed for pain.   10 tablet   0   . lisinopril (PRINIVIL,ZESTRIL) 5 MG tablet   Oral   Take 5 mg by mouth daily.           . naproxen (NAPROSYN) 500 MG tablet   Oral   Take 1 tablet (500 mg total) by mouth 2 (two) times daily with a meal.   30 tablet   0   . ondansetron (ZOFRAN ODT) 4 MG disintegrating tablet   Oral   Take 1 tablet (4 mg total) by mouth every 8 (eight) hours as needed for nausea.   10 tablet   0   . predniSONE (DELTASONE) 10 MG tablet      6, 5, 4, 3, 2 then 1 tablet by mouth daily for 6 days total.   21 tablet   0    BP 106/74  Pulse 73  Temp(Src) 97.8 F (36.6 C) (Oral)  Resp 18  Ht 5' 1.5" (1.562 m)  Wt 152 lb (68.947 kg)  BMI 28.26 kg/m2  SpO2 98% Physical Exam  Nursing note and vitals reviewed. Constitutional: She appears well-developed and well-nourished. No distress.  HENT:  Head: Normocephalic.   Mouth/Throat: Oropharynx is clear and moist. No oropharyngeal exudate.  Periorbital swellinga nd bruising on the R, ttp around the orbital rim, no malocclusion, hemotympanum.  Eyes: Conjunctivae and EOM are normal. Pupils are equal, round, and reactive to light. Right eye exhibits no discharge. Left eye exhibits no discharge. No scleral icterus.  Lids swollen on the R - associated bruising and abrasoins around the R eye.  Conj clear, pupils normal, EOMI  Neck: Normal range of motion. Neck supple. No JVD present. No thyromegaly present.  Cardiovascular: Normal rate, regular rhythm, normal heart sounds and intact distal pulses.  Exam reveals no gallop and no friction rub.   No murmur heard. Pulmonary/Chest: Effort normal and breath sounds normal. No respiratory distress. She has no wheezes. She has no rales.  Abdominal: Soft. Bowel sounds are normal. She exhibits no distension and no mass. There is no tenderness.  Musculoskeletal: Normal range of motion. She exhibits tenderness ( ttp over the R shoulder and prox humerus - no deformity or bruising noted.  ttp over the R knee but able to SLR agains resistance wtihout difficulty.  normal ROM of the R knee). She exhibits no edema.  Lymphadenopathy:    She has no cervical adenopathy.  Neurological: She is alert. Coordination normal.  Pt follows commands, has clear speech, normal CN 3-12, normal memory of events leading up to fall and after fall but not of fall.  Normal coordination.  Skin: Skin is warm and dry. No rash noted. No erythema.  Bruising to the face / periorbital R .  Psychiatric: She has a normal mood and affect. Her behavior is normal.    ED Course  Procedures (including critical care time) Labs Review Labs Reviewed  GLUCOSE, CAPILLARY - Abnormal; Notable for the following:    Glucose-Capillary 109 (*)    All other components within normal limits   Imaging Review Ct Head Wo Contrast  07/28/2013   *RADIOLOGY REPORT*  Clinical  Data:  Fall  CT HEAD WITHOUT CONTRAST CT MAXILLOFACIAL WITHOUT CONTRAST CT CERVICAL SPINE WITHOUT CONTRAST  Technique:  Multidetector CT imaging of the head, cervical spine, and maxillofacial structures were performed using the standard protocol without intravenous contrast. Multiplanar CT image reconstructions of the cervical spine and maxillofacial structures were also generated.  Comparison:  Prior CT from 01/10/2012  CT HEAD  Findings: There is no acute intracranial hemorrhage or infarct.  No mass or midline shift.  No extra-axial fluid collection.  Wallace Cullens- white matter differentiation is well maintained.  CSF containing spaces are normal.  Calvarium is intact.  Orbital soft tissues are within normal limits.  Paranasal sinuses are clear.  The small right mastoid effusion is present.  The left mastoid air cells are clear.  IMPRESSION: 1.  No acute intracranial process. 2.  Small right mastoid effusion, of uncertain clinical significance.  No temporal bone fracture identified.  CT MAXILLOFACIAL  Findings: Mild right periorbital soft tissue contusion is present. The globes are intact.  The bony orbits are intact.  Zygomatic arches, mandible, and maxilla are intact.  The TMJs are approximated.  No nasal bone fracture.  Nasal septum is intact.  Polypoid opacity is present within the floor of the left maxillary sinus.  The paranasal sinuses are otherwise clear.  No radiopaque foreign body.  IMPRESSION: Right periorbital soft tissue contusion.  No acute maxillofacial fracture identified.  Intact globes.  CT CERVICAL SPINE  Findings:   The vertebral bodies are normally aligned.  No prevertebral soft tissue swelling.  Normal C1-2 articulations are intact.  Vertebral body heights are preserved.  There is no acute fracture or listhesis.  Moderate degenerative intervertebral disc space narrowing with endplate osteophytosis is present at to C4-5 and C5-6.  Visualized lung apices are clear.  IMPRESSION: No CT evidence of acute  fracture or listhesis within the cervical spine.   Original Report Authenticated By: Rise Mu, M.D.   Ct Cervical Spine Wo Contrast  07/28/2013   *RADIOLOGY REPORT*  Clinical Data:  Fall  CT HEAD WITHOUT CONTRAST CT MAXILLOFACIAL WITHOUT CONTRAST CT CERVICAL SPINE WITHOUT CONTRAST  Technique:  Multidetector CT imaging of the head, cervical spine, and maxillofacial structures were performed using the standard protocol without intravenous contrast. Multiplanar CT image reconstructions of the cervical spine and maxillofacial structures were also generated.  Comparison:  Prior CT from 01/10/2012  CT HEAD  Findings: There is no acute intracranial hemorrhage or infarct.  No mass or midline shift.  No extra-axial fluid collection.  Wallace Cullens- white matter differentiation is well maintained.  CSF containing spaces are normal.  Calvarium is intact.  Orbital soft tissues are within normal limits.  Paranasal sinuses are clear.  The small right mastoid effusion is present.  The left mastoid air cells are clear.  IMPRESSION: 1.  No acute intracranial process. 2.  Small right mastoid effusion, of uncertain clinical significance.  No temporal bone fracture identified.  CT MAXILLOFACIAL  Findings: Mild right periorbital soft tissue contusion is present. The globes are intact.  The bony orbits are intact.  Zygomatic arches, mandible, and maxilla are intact.  The TMJs are approximated.  No nasal bone fracture.  Nasal septum is intact.  Polypoid opacity is present within the floor of the left maxillary sinus.  The paranasal sinuses are otherwise clear.  No radiopaque foreign body.  IMPRESSION: Right periorbital soft tissue contusion.  No acute maxillofacial fracture identified.  Intact globes.  CT CERVICAL SPINE  Findings:   The vertebral  bodies are normally aligned.  No prevertebral soft tissue swelling.  Normal C1-2 articulations are intact.  Vertebral body heights are preserved.  There is no acute fracture or listhesis.   Moderate degenerative intervertebral disc space narrowing with endplate osteophytosis is present at to C4-5 and C5-6.  Visualized lung apices are clear.  IMPRESSION: No CT evidence of acute fracture or listhesis within the cervical spine.   Original Report Authenticated By: Rise Mu, M.D.   Dg Humerus Right  07/28/2013   CLINICAL DATA:  Trauma and pain.  EXAM: RIGHT HUMERUS - 2+ VIEW  COMPARISON:  Shoulder films 02/03/2008  FINDINGS: Mild degenerative irregularity of the acromioclavicular joint and rotator cuff insertion. Normal visualized portion of the right hemi thorax. No acute fracture or dislocation.  IMPRESSION: No acute osseous abnormality.   Electronically Signed   By: Jeronimo Greaves   On: 07/28/2013 04:22   Ct Maxillofacial Wo Cm  07/28/2013   *RADIOLOGY REPORT*  Clinical Data:  Fall  CT HEAD WITHOUT CONTRAST CT MAXILLOFACIAL WITHOUT CONTRAST CT CERVICAL SPINE WITHOUT CONTRAST  Technique:  Multidetector CT imaging of the head, cervical spine, and maxillofacial structures were performed using the standard protocol without intravenous contrast. Multiplanar CT image reconstructions of the cervical spine and maxillofacial structures were also generated.  Comparison:  Prior CT from 01/10/2012  CT HEAD  Findings: There is no acute intracranial hemorrhage or infarct.  No mass or midline shift.  No extra-axial fluid collection.  Wallace Cullens- white matter differentiation is well maintained.  CSF containing spaces are normal.  Calvarium is intact.  Orbital soft tissues are within normal limits.  Paranasal sinuses are clear.  The small right mastoid effusion is present.  The left mastoid air cells are clear.  IMPRESSION: 1.  No acute intracranial process. 2.  Small right mastoid effusion, of uncertain clinical significance.  No temporal bone fracture identified.  CT MAXILLOFACIAL  Findings: Mild right periorbital soft tissue contusion is present. The globes are intact.  The bony orbits are intact.  Zygomatic  arches, mandible, and maxilla are intact.  The TMJs are approximated.  No nasal bone fracture.  Nasal septum is intact.  Polypoid opacity is present within the floor of the left maxillary sinus.  The paranasal sinuses are otherwise clear.  No radiopaque foreign body.  IMPRESSION: Right periorbital soft tissue contusion.  No acute maxillofacial fracture identified.  Intact globes.  CT CERVICAL SPINE  Findings:   The vertebral bodies are normally aligned.  No prevertebral soft tissue swelling.  Normal C1-2 articulations are intact.  Vertebral body heights are preserved.  There is no acute fracture or listhesis.  Moderate degenerative intervertebral disc space narrowing with endplate osteophytosis is present at to C4-5 and C5-6.  Visualized lung apices are clear.  IMPRESSION: No CT evidence of acute fracture or listhesis within the cervical spine.   Original Report Authenticated By: Rise Mu, M.D.    MDM   1. Facial contusion, initial encounter   2. Fall against object, initial encounter   3. Contusion, arm, upper, right, initial encounter    Pt has had a fall, head / facial injury - she will need eval with imaging to see if she has traumatic injury as she has ongoing nausea and vomiting with headache.  The fall could have been mechanical while she slipped on the stool, possible it was hypoglycemic but it wasn't tested, she ate and walked and now CBG is > 100 on arrival.  Pt has done well with imaging -  no signs of fracture, meds ordered and given, pt stable for d/c.  Meds given in ED:  Medications  oxyCODONE-acetaminophen (PERCOCET/ROXICET) 5-325 MG per tablet 2 tablet (not administered)  ondansetron (ZOFRAN-ODT) disintegrating tablet 4 mg (not administered)    New Prescriptions   HYDROCODONE-ACETAMINOPHEN (NORCO/VICODIN) 5-325 MG PER TABLET    Take 2 tablets by mouth every 4 (four) hours as needed for pain.   NAPROXEN (NAPROSYN) 500 MG TABLET    Take 1 tablet (500 mg total) by mouth 2  (two) times daily with a meal.   ONDANSETRON (ZOFRAN ODT) 4 MG DISINTEGRATING TABLET    Take 1 tablet (4 mg total) by mouth every 8 (eight) hours as needed for nausea.      Vida Roller, MD 07/28/13 [phone]

## 2013-07-28 NOTE — ED Notes (Signed)
Patient states she was hanging pictures and thinks her sugar dropped and she fell.  Patient has bruising and edema around right eye.

## 2014-07-04 ENCOUNTER — Encounter (HOSPITAL_COMMUNITY): Payer: Self-pay | Admitting: Emergency Medicine

## 2014-07-04 ENCOUNTER — Emergency Department (HOSPITAL_COMMUNITY)
Admission: EM | Admit: 2014-07-04 | Discharge: 2014-07-04 | Disposition: A | Discharge status: Home / Self Care | Payer: Self-pay | Attending: Emergency Medicine | Admitting: Emergency Medicine

## 2014-07-04 DIAGNOSIS — F410 Panic disorder [episodic paroxysmal anxiety] without agoraphobia: Secondary | ICD-10-CM | POA: Insufficient documentation

## 2014-07-04 DIAGNOSIS — F319 Bipolar disorder, unspecified: Secondary | ICD-10-CM | POA: Insufficient documentation

## 2014-07-04 DIAGNOSIS — Y939 Activity, unspecified: Secondary | ICD-10-CM | POA: Insufficient documentation

## 2014-07-04 DIAGNOSIS — IMO0002 Reserved for concepts with insufficient information to code with codable children: Secondary | ICD-10-CM | POA: Insufficient documentation

## 2014-07-04 DIAGNOSIS — F17200 Nicotine dependence, unspecified, uncomplicated: Secondary | ICD-10-CM | POA: Insufficient documentation

## 2014-07-04 DIAGNOSIS — E119 Type 2 diabetes mellitus without complications: Secondary | ICD-10-CM | POA: Insufficient documentation

## 2014-07-04 DIAGNOSIS — Z8669 Personal history of other diseases of the nervous system and sense organs: Secondary | ICD-10-CM | POA: Insufficient documentation

## 2014-07-04 DIAGNOSIS — Y929 Unspecified place or not applicable: Secondary | ICD-10-CM | POA: Insufficient documentation

## 2014-07-04 DIAGNOSIS — Z79899 Other long term (current) drug therapy: Secondary | ICD-10-CM | POA: Insufficient documentation

## 2014-07-04 DIAGNOSIS — G8929 Other chronic pain: Secondary | ICD-10-CM | POA: Insufficient documentation

## 2014-07-04 DIAGNOSIS — S335XXA Sprain of ligaments of lumbar spine, initial encounter: Secondary | ICD-10-CM | POA: Insufficient documentation

## 2014-07-04 DIAGNOSIS — X58XXXA Exposure to other specified factors, initial encounter: Secondary | ICD-10-CM | POA: Insufficient documentation

## 2014-07-04 DIAGNOSIS — Z7982 Long term (current) use of aspirin: Secondary | ICD-10-CM | POA: Insufficient documentation

## 2014-07-04 DIAGNOSIS — L255 Unspecified contact dermatitis due to plants, except food: Secondary | ICD-10-CM | POA: Insufficient documentation

## 2014-07-04 DIAGNOSIS — S39012A Strain of muscle, fascia and tendon of lower back, initial encounter: Secondary | ICD-10-CM

## 2014-07-04 MED ORDER — DEXAMETHASONE SODIUM PHOSPHATE 4 MG/ML IJ SOLN
10.0000 mg | Freq: Once | INTRAMUSCULAR | Status: AC
  Administered 2014-07-04: 10 mg via INTRAMUSCULAR
  Filled 2014-07-04: qty 3

## 2014-07-04 MED ORDER — PREDNISONE 20 MG PO TABS: 20.0000 mg | ORAL_TABLET | Freq: Two times a day (BID) | ORAL | Status: DC

## 2014-07-04 MED ORDER — TRAMADOL HCL 50 MG PO TABS: 50.0000 mg | ORAL_TABLET | Freq: Four times a day (QID) | ORAL | Status: DC | PRN

## 2014-07-04 MED ORDER — HYDROXYZINE HCL 25 MG PO TABS: 25.0000 mg | ORAL_TABLET | Freq: Four times a day (QID) | ORAL | Status: DC | PRN
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
## 2014-07-04 NOTE — Discharge Instructions (Signed)
Back Pain, Adult Back pain is very common. The pain often gets better over time. The cause of back pain is usually not dangerous. Most people can learn to manage their back pain on their own.  HOME CARE   Stay active. Start with short walks on flat ground if you can. Try to walk farther each day.  Do not sit, drive, or stand in one place for more than 30 minutes. Do not stay in bed.  Do not avoid exercise or work. Activity can help your back heal faster.  Be careful when you bend or lift an object. Bend at your knees, keep the object close to you, and do not twist.  Sleep on a firm mattress. Lie on your side, and bend your knees. If you lie on your back, put a pillow under your knees.  Only take medicines as told by your doctor.  Put ice on the injured area.  Put ice in a plastic bag.  Place a towel between your skin and the bag.  Leave the ice on for 15-20 minutes, 03-04 times a day for the first 2 to 3 days. After that, you can switch between ice and heat packs.  Ask your doctor about back exercises or massage.  Avoid feeling anxious or stressed. Find good ways to deal with stress, such as exercise. GET HELP RIGHT AWAY IF:   Your pain does not go away with rest or medicine.  Your pain does not go away in 1 week.  You have new problems.  You do not feel well.  The pain spreads into your legs.  You cannot control when you poop (bowel movement) or pee (urinate).  Your arms or legs feel weak or lose feeling (numbness).  You feel sick to your stomach (nauseous) or throw up (vomit).  You have belly (abdominal) pain.  You feel like you may pass out (faint). MAKE SURE YOU:   Understand these instructions.  Will watch your condition.  Will get help right away if you are not doing well or get worse. Document Released: 04/04/2008 Document Revised: 01/09/2012 Document Reviewed: 02/18/2014 [facility] Patient Information 2015 [place], [place]. This information is not intended  to replace advice given to you by your health care provider. Make sure you discuss any questions you have with your health care provider.  Poison Ku [facility] is a rash caused by touching the leaves of the poison oak plant. You may have a rash with redness and itching. Sometimes, blisters appear and break open. Your eyes may get puffy (swollen). Poison oak often heals in 2 to 3 weeks without treatment.  HOME CARE  If you touch poison oak:  Wash your skin with soap and water right away. Wash under your fingernails. Do not rub the skin very hard.  Wash any clothes you were wearing.  Avoid poison oak in the future. Poison oak usually has 3 leaves on a stem.  Use medicines to help with itching as told by your doctor. Do not drive when you take this medicine.  Keep open sores dry, clean, and covered with a bandage and medicated cream, if needed.  Ask your doctor about medicine for children. GET HELP RIGHT AWAY IF:  You have open sores.  Redness spreads beyond the area of the rash.  There is yellowish white fluid (pus) coming from the rash.  Pain gets worse.  You have a temperature by mouth above 102 F (38.9 C), not controlled by medicine. MAKE SURE YOU:  Understand  these instructions.  Will watch your condition.  Will get help right away if you are not doing well or get worse. Document Released: 11/19/2010 Document Revised: 01/09/2012 Document Reviewed: 11/19/2010 [facility] Patient Information 2015 [place], [place]. This information is not intended to replace advice given to you by your health care provider. Make sure you discuss any questions you have with your health care provider.
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
## 2014-07-07 NOTE — ED Provider Notes (Signed)
Medical screening examination/treatment/procedure(s) were performed by non-physician practitioner and as supervising physician I was immediately available for consultation/collaboration.   EKG Interpretation None        Maudry Diego, MD 07/07/14 1345

## 2014-08-05 ENCOUNTER — Encounter (HOSPITAL_COMMUNITY): Payer: Self-pay | Admitting: Emergency Medicine

## 2014-08-05 ENCOUNTER — Emergency Department (HOSPITAL_COMMUNITY): Payer: Self-pay

## 2014-08-05 ENCOUNTER — Other Ambulatory Visit: Payer: Self-pay

## 2014-08-05 ENCOUNTER — Emergency Department (HOSPITAL_COMMUNITY)
Admission: EM | Admit: 2014-08-05 | Discharge: 2014-08-05 | Disposition: A | Discharge status: Home / Self Care | Payer: Self-pay | Attending: Emergency Medicine | Admitting: Emergency Medicine

## 2014-08-05 DIAGNOSIS — T50995A Adverse effect of other drugs, medicaments and biological substances, initial encounter: Secondary | ICD-10-CM | POA: Insufficient documentation

## 2014-08-05 DIAGNOSIS — G8929 Other chronic pain: Secondary | ICD-10-CM | POA: Insufficient documentation

## 2014-08-05 DIAGNOSIS — R000 Tachycardia, unspecified: Secondary | ICD-10-CM | POA: Insufficient documentation

## 2014-08-05 DIAGNOSIS — Z8669 Personal history of other diseases of the nervous system and sense organs: Secondary | ICD-10-CM | POA: Insufficient documentation

## 2014-08-05 DIAGNOSIS — R443 Hallucinations, unspecified: Secondary | ICD-10-CM

## 2014-08-05 DIAGNOSIS — E119 Type 2 diabetes mellitus without complications: Secondary | ICD-10-CM | POA: Insufficient documentation

## 2014-08-05 DIAGNOSIS — Z720 Tobacco use: Secondary | ICD-10-CM | POA: Insufficient documentation

## 2014-08-05 DIAGNOSIS — R441 Visual hallucinations: Secondary | ICD-10-CM | POA: Insufficient documentation

## 2014-08-05 DIAGNOSIS — Z7982 Long term (current) use of aspirin: Secondary | ICD-10-CM | POA: Insufficient documentation

## 2014-08-05 DIAGNOSIS — T50905A Adverse effect of unspecified drugs, medicaments and biological substances, initial encounter: Secondary | ICD-10-CM

## 2014-08-05 DIAGNOSIS — Z791 Long term (current) use of non-steroidal anti-inflammatories (NSAID): Secondary | ICD-10-CM | POA: Insufficient documentation

## 2014-08-05 DIAGNOSIS — I10 Essential (primary) hypertension: Secondary | ICD-10-CM | POA: Insufficient documentation

## 2014-08-05 DIAGNOSIS — Z79899 Other long term (current) drug therapy: Secondary | ICD-10-CM | POA: Insufficient documentation

## 2014-08-05 LAB — RAPID URINE DRUG SCREEN, HOSP PERFORMED
AMPHETAMINES: NOT DETECTED
BENZODIAZEPINES: POSITIVE — AB
Barbiturates: NOT DETECTED
COCAINE: NOT DETECTED
OPIATES: NOT DETECTED
TETRAHYDROCANNABINOL: POSITIVE — AB

## 2014-08-05 LAB — BASIC METABOLIC PANEL
Anion gap: 15 (ref 5–15)
BUN: 8 mg/dL (ref 6–23)
CHLORIDE: 97 meq/L (ref 96–112)
CO2: 24 meq/L (ref 19–32)
Calcium: 8.7 mg/dL (ref 8.4–10.5)
Creatinine, Ser: 0.86 mg/dL (ref 0.50–1.10)
GFR calc Af Amer: 87 mL/min — ABNORMAL LOW (ref >90)
GFR calc non Af Amer: 75 mL/min — ABNORMAL LOW (ref >90)
GLUCOSE: 263 mg/dL — AB (ref 70–99)
POTASSIUM: 3.5 meq/L — AB (ref 3.7–5.3)
Sodium: 136 mEq/L — ABNORMAL LOW (ref 137–147)

## 2014-08-05 LAB — CBC
HEMATOCRIT: 44.6 % (ref 36.0–46.0)
HEMOGLOBIN: 14.8 g/dL (ref 12.0–15.0)
MCH: 31.5 pg (ref 26.0–34.0)
MCHC: 33.2 g/dL (ref 30.0–36.0)
MCV: 94.9 fL (ref 78.0–100.0)
Platelets: 255 10*3/uL (ref 150–400)
RBC: 4.7 MIL/uL (ref 3.87–5.11)
RDW: 13 % (ref 11.5–15.5)
WBC: 11.3 10*3/uL — ABNORMAL HIGH (ref 4.0–10.5)

## 2014-08-05 LAB — TROPONIN I: Troponin I: <0.3 ng/mL (ref <0.30)

## 2014-08-05 MED ORDER — LORAZEPAM 2 MG/ML IJ SOLN
1.0000 mg | Freq: Once | INTRAMUSCULAR | Status: AC
  Administered 2014-08-05: 1 mg via INTRAVENOUS
  Filled 2014-08-05: qty 1

## 2014-08-05 MED ORDER — CITALOPRAM HYDROBROMIDE 20 MG PO TABS: 20.0000 mg | ORAL_TABLET | Freq: Every day | ORAL | Status: DC

## 2014-08-05 NOTE — ED Notes (Signed)
Pt reports is diabetic and sugar dropped this morning.  States ate something and started feeling better.  Reports smoking THC and "thinks he put something in it."  Pt reports calling 911 because she felt like she was having a heart attack.  Pt reports cp at this time.  Denies any other drug use, pt in manic behavior at this time.  Denies SI/HI.  Pt reports being extremely depressed recently.  States, "when my passing out episodes started, I was seeing heaven and it was so beautiful."  Denies any other hallucinations/delusions.
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
## 2014-08-05 NOTE — ED Notes (Signed)
MD at bedside. 

## 2014-08-05 NOTE — ED Provider Notes (Signed)
CSN: [id number]     Arrival date & time 08/05/14  1420 History   This chart was scribed for Katelyn Norrie, MD, by Neta Ehlers, ED Scribe. This patient was seen in room APA16A/APA16A and the patient's care was started at 2:54 PM First MD Initiated Contact with Patient 08/05/14 1429     Chief Complaint  Patient presents with  . medical evaluation     The history is provided by the patient. No language interpreter was used.   HPI Comments: Katelyn Kelly is a 53 y.o. female, with a h/o manic depression, who presents to the Emergency Department complaining of chest pressure which began an hour ago after she had smoked marijuana. The chest pressure continues at bedside. She characterizes the pressure as a "warm-hot and cold spreading out." She denies previous episodes of the chest pressure. She states she thought she was having a heart attack. She also states she felt like she was going to pass out. She also denies diaphoresis, SOB, or nausea.  This morning she purchased marijuana from a known dealer she has bought from before, and she smoked it because she wanted to relax; she reports visual hallucinations shortly after she smoked the marijuana as well as the chest pain. She states she saw pink and purple clouds as well as Jesus; the visual hallucinations have resolved at bedside. She denies a h/o similar symptoms.  The pt states she smokes marijuana because her depression and panic attacks are improved after smoking marijuana; she denies previous experiences such as today.   She reports at the appointment with her psychiatrist at [facility] last Wednesday about 6 days ago, she was placed on a new regiment with a tapering of Celexa (1/2 dose) and a new purple medicine which looks like Xanax that she thinks is either Prozac or zoloft at bedtime. She states she has not been able to sleep night. Yesterday evening the pt took a half of a dose of the purple medicine because she "is scared of medicine." She  reports she slept all night.    She has been treated at [facility] since 1997; she reports her last admission was approximately ten years ago and she has had 3 psychiatric admissions in the past.. She denies a h/o self injury including denying overdose of drugs or cutting.   She endorses a h/o diabetic and HTN.   PCP Dr Cindie Laroche  Past Medical History  Diagnosis Date  . Manic depression   . Diabetes mellitus   . Chronic back pain   . Chronic neck pain   . H/O syncope   . Panic attack   . Bilateral carpal tunnel syndrome   . Anxiety and depression    Past Surgical History  Procedure Laterality Date  . Cholecystectomy    . Abdominal hysterectomy    . Tubal ligation    . Appendectomy    . Abdominal surgery    . Carpal tunnel release     No family history on file. History  Substance Use Topics  . Smoking status: Current Every Day Smoker    Types: Cigarettes  . Smokeless tobacco: Not on file  . Alcohol Use: No  lives with mother States her husband left her States she is unable to work b/o panic attacks Smokes 2 ppd   No OB history provided.  Review of Systems  All other systems reviewed and are negative.   A complete 10 system review of systems was obtained, and all systems were negative except  where indicated in the HPI and PE.    Allergies  Methylphenidate derivatives; Sinequan; and Tranxene  Home Medications   Prior to Admission medications   Medication Sig Start Date End Date Taking? Authorizing Provider  ALPRAZolam Duanne Moron) 0.5 MG tablet Take 1 mg by mouth 3 (three) times daily.   Yes Historical Provider, MD  aspirin EC 81 MG tablet Take 81 mg by mouth at bedtime.   Yes Historical Provider, MD  ibuprofen (ADVIL,MOTRIN) 200 MG tablet Take 600 mg by mouth every 8 (eight) hours as needed for mild pain. For pain   Yes Historical Provider, MD  lisinopril (PRINIVIL,ZESTRIL) 10 MG tablet Take 10 mg by mouth daily.   Yes Historical Provider, MD  metFORMIN (GLUCOPHAGE)  500 MG tablet Take 500 mg by mouth 2 (two) times daily with a meal.   Yes Historical Provider, MD  sertraline (ZOLOFT) 50 MG tablet Take 25 mg by mouth at bedtime.   Yes Historical Provider, MD  citalopram (CELEXA) 20 MG tablet Take 1 tablet (20 mg total) by mouth daily. 08/05/14   Katelyn Norrie, MD   Triage Vitals: BP 144/82  Pulse 114  Temp(Src) 99.2 F (37.3 C) (Oral)  Resp 17  Ht 5\' 1"  (1.549 m)  Wt 180 lb (81.647 kg)  BMI 34.03 kg/m2  SpO2 96%  Vital signs normal except tachycardia    Physical Exam  Nursing note and vitals reviewed. Constitutional: She is oriented to person, place, and time. She appears well-developed and well-nourished.  Non-toxic appearance. She does not appear ill. No distress.     HENT:  Head: Normocephalic and atraumatic.  Right Ear: External ear normal.  Left Ear: External ear normal.  Nose: Nose normal. No mucosal edema or rhinorrhea.  Mouth/Throat: Oropharynx is clear and moist and mucous membranes are normal. No dental abscesses or uvula swelling.  Eyes: Conjunctivae and EOM are normal. Pupils are equal, round, and reactive to light.  Neck: Normal range of motion and full passive range of motion without pain. Neck supple. No tracheal deviation present.  Cardiovascular: Regular rhythm and normal heart sounds.  Tachycardia present.  Exam reveals no gallop and no friction rub.   No murmur heard. Tachycardic.   Pulmonary/Chest: Effort normal and breath sounds normal. No respiratory distress. She has no wheezes. She has no rhonchi. She has no rales. She exhibits no tenderness and no crepitus.  Abdominal: Soft. Normal appearance and bowel sounds are normal. She exhibits no distension. There is no tenderness. There is no rebound and no guarding.  Musculoskeletal: Normal range of motion. She exhibits no edema and no tenderness.  Moves all extremities well.   Neurological: She is alert and oriented to person, place, and time. She has normal strength. No cranial  nerve deficit.  Skin: Skin is warm, dry and intact. No rash noted. No erythema. No pallor.  Skin appears flushed and sunburned, states she has a tanning bed  Psychiatric: Her mood appears anxious. Her speech is rapid and/or pressured. She is agitated and hyperactive.  Anxious and tearful appearing.    ED Course  Procedures (including critical care time)  Medications  LORazepam (ATIVAN) injection 1 mg (1 mg Intravenous Given 08/05/14 1537)     DIAGNOSTIC STUDIES: Oxygen Saturation is 96% on room air, normal by my interpretation.    COORDINATION OF CARE:  3:07 PM- Discussed treatment plan with patient, and the patient agreed to the plan.   Recheck at 1740 patient states she's feeling better. She has had no  more hallucinations. Her BP is 119/99 with heart rate 87. We reviewed her last ED visit in September at that point she was on Celexa 20 mg a day. She states she does not have any more Celexa pills, I gave her prescription for one week, she states she has a recheck appointment at day [facility] in the next 2-3 days. She is to stop the Zoloft, she has the pills dispense her with her and it was Zoloft but she was started on last week. Her mother is here and states "she has not been the same since they started her on the Zoloft". However some of her problems appears to be whatever she spoke today with the marijuana although nothing in particular showed up on her urine drug screen.   Labs Review Results for orders placed during the hospital encounter of 08/05/14  CBC      Result Value Ref Range   WBC 11.3 (*) 4.0 - 10.5 K/uL   RBC 4.70  3.87 - 5.11 MIL/uL   Hemoglobin 14.8  12.0 - 15.0 g/dL   HCT 44.6  36.0 - 46.0 %   MCV 94.9  78.0 - 100.0 fL   MCH 31.5  26.0 - 34.0 pg   MCHC 33.2  30.0 - 36.0 g/dL   RDW 13.0  11.5 - 15.5 %   Platelets 255  150 - 400 K/uL  BASIC METABOLIC PANEL      Result Value Ref Range   Sodium 136 (*) 137 - 147 mEq/L   Potassium 3.5 (*) 3.7 - 5.3 mEq/L   Chloride  97  96 - 112 mEq/L   CO2 24  19 - 32 mEq/L   Glucose, Bld 263 (*) 70 - 99 mg/dL   BUN 8  6 - 23 mg/dL   Creatinine, Ser 0.86  0.50 - 1.10 mg/dL   Calcium 8.7  8.4 - 10.5 mg/dL   GFR calc non Af Amer 75 (*) >90 mL/min   GFR calc Af Amer 87 (*) >90 mL/min   Anion gap 15  5 - 15  TROPONIN I      Result Value Ref Range   Troponin I <0.30  <0.30 ng/mL  URINE RAPID DRUG SCREEN (HOSP PERFORMED)      Result Value Ref Range   Opiates NONE DETECTED  NONE DETECTED   Cocaine NONE DETECTED  NONE DETECTED   Benzodiazepines POSITIVE (*) NONE DETECTED   Amphetamines NONE DETECTED  NONE DETECTED   Tetrahydrocannabinol POSITIVE (*) NONE DETECTED   Barbiturates NONE DETECTED  NONE DETECTED   Laboratory interpretation all normal except expected positive UDS, mild hypokalemia, leukocytosis   Imaging Review Dg Chest Port 1 View  08/05/2014   CLINICAL DATA:  Chest pain.  EXAM: PORTABLE CHEST - 1 VIEW  COMPARISON:  01/10/2012  FINDINGS: The heart size and mediastinal contours are within normal limits. Both lungs are clear. The visualized skeletal structures are unremarkable.  IMPRESSION: Normal exam.   Electronically Signed   By: Rozetta Nunnery M.D.   On: 08/05/2014 15:01     EKG Interpretation None        Date: 08/05/2014  Rate: 110  Rhythm: sinus tachycardia  QRS Axis: normal  Intervals: normal  ST/T Wave abnormalities: nonspecific ST/T changes  Conduction Disutrbances:low voltage precordial leads  Narrative Interpretation:   Old EKG Reviewed: none available    MDM   Final diagnoses:  Hallucinations  Medication side effect, initial encounter    New Prescriptions   CITALOPRAM (CELEXA) 20  MG TABLET    Take 1 tablet (20 mg total) by mouth daily.     Plan discharge  Rolland Porter, MD, FACEP    I personally performed the services described in this documentation, which was scribed in my presence. The recorded information has been reviewed and considered.  Rolland Porter, MD,  Abram Sander    Katelyn Norrie, MD 08/05/14 [phone]

## 2014-08-05 NOTE — Discharge Instructions (Signed)
Stop the zoloft, and stay on your celexa until you can go back to [facility] this week  to discuss your medications. Return if you feel worse again.

## 2015-05-20 ENCOUNTER — Emergency Department (HOSPITAL_COMMUNITY)
Admission: EM | Admit: 2015-05-20 | Discharge: 2015-05-20 | Disposition: A | Discharge status: Home / Self Care | Payer: No Typology Code available for payment source | Attending: Emergency Medicine | Admitting: Emergency Medicine

## 2015-05-20 ENCOUNTER — Emergency Department (HOSPITAL_COMMUNITY): Payer: No Typology Code available for payment source

## 2015-05-20 ENCOUNTER — Encounter (HOSPITAL_COMMUNITY): Payer: Self-pay | Admitting: Emergency Medicine

## 2015-05-20 DIAGNOSIS — F319 Bipolar disorder, unspecified: Secondary | ICD-10-CM | POA: Insufficient documentation

## 2015-05-20 DIAGNOSIS — F410 Panic disorder [episodic paroxysmal anxiety] without agoraphobia: Secondary | ICD-10-CM | POA: Diagnosis not present

## 2015-05-20 DIAGNOSIS — Z8669 Personal history of other diseases of the nervous system and sense organs: Secondary | ICD-10-CM | POA: Diagnosis not present

## 2015-05-20 DIAGNOSIS — G8929 Other chronic pain: Secondary | ICD-10-CM | POA: Diagnosis not present

## 2015-05-20 DIAGNOSIS — Z79899 Other long term (current) drug therapy: Secondary | ICD-10-CM | POA: Diagnosis not present

## 2015-05-20 DIAGNOSIS — S0990XA Unspecified injury of head, initial encounter: Secondary | ICD-10-CM | POA: Diagnosis not present

## 2015-05-20 DIAGNOSIS — S4992XA Unspecified injury of left shoulder and upper arm, initial encounter: Secondary | ICD-10-CM | POA: Insufficient documentation

## 2015-05-20 DIAGNOSIS — I10 Essential (primary) hypertension: Secondary | ICD-10-CM | POA: Diagnosis not present

## 2015-05-20 DIAGNOSIS — S199XXA Unspecified injury of neck, initial encounter: Secondary | ICD-10-CM | POA: Diagnosis not present

## 2015-05-20 DIAGNOSIS — Y9389 Activity, other specified: Secondary | ICD-10-CM | POA: Diagnosis not present

## 2015-05-20 DIAGNOSIS — S299XXA Unspecified injury of thorax, initial encounter: Secondary | ICD-10-CM | POA: Diagnosis not present

## 2015-05-20 DIAGNOSIS — Z720 Tobacco use: Secondary | ICD-10-CM | POA: Diagnosis not present

## 2015-05-20 DIAGNOSIS — Y92410 Unspecified street and highway as the place of occurrence of the external cause: Secondary | ICD-10-CM | POA: Diagnosis not present

## 2015-05-20 DIAGNOSIS — Z7982 Long term (current) use of aspirin: Secondary | ICD-10-CM | POA: Diagnosis not present

## 2015-05-20 DIAGNOSIS — Y998 Other external cause status: Secondary | ICD-10-CM | POA: Insufficient documentation

## 2015-05-20 DIAGNOSIS — E119 Type 2 diabetes mellitus without complications: Secondary | ICD-10-CM | POA: Insufficient documentation

## 2015-05-20 HISTORY — DX: Essential (primary) hypertension: I10

## 2015-05-20 MED ORDER — METAXALONE 400 MG PO TABS: 400.0000 mg | ORAL_TABLET | Freq: Three times a day (TID) | ORAL | Status: DC | PRN

## 2015-05-20 NOTE — ED Notes (Signed)
Patient with no complaints at this time. Respirations even and unlabored. Skin warm/dry. Discharge instructions reviewed with patient at this time. Patient given opportunity to voice concerns/ask questions. Patient discharged at this time and left Emergency Department with steady gait.   
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
## 2015-05-20 NOTE — Discharge Instructions (Signed)
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
## 2015-05-20 NOTE — ED Notes (Signed)
Given ice pack for left shoulder.

## 2015-05-23 ENCOUNTER — Encounter (HOSPITAL_COMMUNITY): Payer: Self-pay | Admitting: *Deleted

## 2015-05-23 ENCOUNTER — Emergency Department (HOSPITAL_COMMUNITY)
Admission: EM | Admit: 2015-05-23 | Discharge: 2015-05-23 | Disposition: A | Discharge status: Home / Self Care | Payer: No Typology Code available for payment source | Attending: Emergency Medicine | Admitting: Emergency Medicine

## 2015-05-23 ENCOUNTER — Emergency Department (HOSPITAL_COMMUNITY): Payer: No Typology Code available for payment source

## 2015-05-23 DIAGNOSIS — Z7982 Long term (current) use of aspirin: Secondary | ICD-10-CM | POA: Diagnosis not present

## 2015-05-23 DIAGNOSIS — F410 Panic disorder [episodic paroxysmal anxiety] without agoraphobia: Secondary | ICD-10-CM | POA: Insufficient documentation

## 2015-05-23 DIAGNOSIS — S301XXD Contusion of abdominal wall, subsequent encounter: Secondary | ICD-10-CM | POA: Diagnosis not present

## 2015-05-23 DIAGNOSIS — I10 Essential (primary) hypertension: Secondary | ICD-10-CM | POA: Diagnosis not present

## 2015-05-23 DIAGNOSIS — Z720 Tobacco use: Secondary | ICD-10-CM | POA: Diagnosis not present

## 2015-05-23 DIAGNOSIS — F319 Bipolar disorder, unspecified: Secondary | ICD-10-CM | POA: Diagnosis not present

## 2015-05-23 DIAGNOSIS — G8929 Other chronic pain: Secondary | ICD-10-CM | POA: Diagnosis not present

## 2015-05-23 DIAGNOSIS — E119 Type 2 diabetes mellitus without complications: Secondary | ICD-10-CM | POA: Insufficient documentation

## 2015-05-23 DIAGNOSIS — Z3202 Encounter for pregnancy test, result negative: Secondary | ICD-10-CM | POA: Insufficient documentation

## 2015-05-23 DIAGNOSIS — Z79899 Other long term (current) drug therapy: Secondary | ICD-10-CM | POA: Insufficient documentation

## 2015-05-23 DIAGNOSIS — R112 Nausea with vomiting, unspecified: Secondary | ICD-10-CM | POA: Insufficient documentation

## 2015-05-23 LAB — URINALYSIS, ROUTINE W REFLEX MICROSCOPIC
Bilirubin Urine: NEGATIVE
Glucose, UA: NEGATIVE mg/dL
Hgb urine dipstick: NEGATIVE
KETONES UR: NEGATIVE mg/dL
LEUKOCYTES UA: NEGATIVE
Nitrite: NEGATIVE
PROTEIN: NEGATIVE mg/dL
Urobilinogen, UA: 0.2 mg/dL (ref 0.0–1.0)
pH: 6 (ref 5.0–8.0)

## 2015-05-23 LAB — COMPREHENSIVE METABOLIC PANEL
ALBUMIN: 4 g/dL (ref 3.5–5.0)
ALK PHOS: 66 U/L (ref 38–126)
ALT: 13 U/L — AB (ref 14–54)
AST: 13 U/L — AB (ref 15–41)
Anion gap: 6 (ref 5–15)
BUN: 7 mg/dL (ref 6–20)
CALCIUM: 8.7 mg/dL — AB (ref 8.9–10.3)
CO2: 25 mmol/L (ref 22–32)
Chloride: 109 mmol/L (ref 101–111)
Creatinine, Ser: 0.81 mg/dL (ref 0.44–1.00)
GFR calc Af Amer: >60 mL/min (ref >60)
GFR calc non Af Amer: >60 mL/min (ref >60)
GLUCOSE: 86 mg/dL (ref 65–99)
Potassium: 4.4 mmol/L (ref 3.5–5.1)
Sodium: 140 mmol/L (ref 135–145)
Total Bilirubin: 0.4 mg/dL (ref 0.3–1.2)
Total Protein: 7.1 g/dL (ref 6.5–8.1)

## 2015-05-23 LAB — CBC WITH DIFFERENTIAL/PLATELET
Basophils Absolute: 0.1 10*3/uL (ref 0.0–0.1)
Basophils Relative: 1 % (ref 0–1)
Eosinophils Absolute: 0.4 10*3/uL (ref 0.0–0.7)
Eosinophils Relative: 3 % (ref 0–5)
HCT: 44.5 % (ref 36.0–46.0)
HEMOGLOBIN: 14.9 g/dL (ref 12.0–15.0)
Lymphocytes Relative: 38 % (ref 12–46)
Lymphs Abs: 4.5 10*3/uL — ABNORMAL HIGH (ref 0.7–4.0)
MCH: 31.6 pg (ref 26.0–34.0)
MCHC: 33.5 g/dL (ref 30.0–36.0)
MCV: 94.3 fL (ref 78.0–100.0)
MONOS PCT: 7 % (ref 3–12)
Monocytes Absolute: 0.8 10*3/uL (ref 0.1–1.0)
NEUTROS ABS: 6 10*3/uL (ref 1.7–7.7)
Neutrophils Relative %: 51 % (ref 43–77)
Platelets: 229 10*3/uL (ref 150–400)
RBC: 4.72 MIL/uL (ref 3.87–5.11)
RDW: 13 % (ref 11.5–15.5)
WBC: 11.7 10*3/uL — AB (ref 4.0–10.5)

## 2015-05-23 LAB — LIPASE, BLOOD: Lipase: 28 U/L (ref 22–51)

## 2015-05-23 LAB — POC URINE PREG, ED: Preg Test, Ur: NEGATIVE

## 2015-05-23 MED ORDER — METHOCARBAMOL 500 MG PO TABS: 500.0000 mg | ORAL_TABLET | Freq: Three times a day (TID) | ORAL | Status: DC

## 2015-05-23 MED ORDER — ONDANSETRON HCL 4 MG/2ML IJ SOLN
4.0000 mg | Freq: Once | INTRAMUSCULAR | Status: AC
  Administered 2015-05-23: 4 mg via INTRAVENOUS
  Filled 2015-05-23: qty 2

## 2015-05-23 MED ORDER — SODIUM CHLORIDE 0.9 % IV SOLN
1000.0000 mL | INTRAVENOUS | Status: DC
  Administered 2015-05-23: 1000 mL via INTRAVENOUS

## 2015-05-23 MED ORDER — HYDROMORPHONE HCL 1 MG/ML IJ SOLN
1.0000 mg | Freq: Once | INTRAMUSCULAR | Status: AC
  Administered 2015-05-23: 1 mg via INTRAVENOUS
  Filled 2015-05-23: qty 1

## 2015-05-23 MED ORDER — SODIUM CHLORIDE 0.9 % IV SOLN
1000.0000 mL | Freq: Once | INTRAVENOUS | Status: AC
  Administered 2015-05-23: 1000 mL via INTRAVENOUS

## 2015-05-23 MED ORDER — IOHEXOL 300 MG/ML  SOLN
100.0000 mL | Freq: Once | INTRAMUSCULAR | Status: AC | PRN
Start: 2015-05-23 — End: 2015-05-23
  Administered 2015-05-23: 100 mL via INTRAVENOUS

## 2015-05-23 MED ORDER — HYDROCODONE-ACETAMINOPHEN 7.5-325 MG PO TABS: 1.0000 | ORAL_TABLET | ORAL | Status: DC | PRN
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
## 2015-05-23 NOTE — ED Notes (Signed)
Pt drowsy after medication, 02 sat decreased to 87% on room air.  Pt placed on 02 at 2 liters, sat increased to 96% on 2 liters
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
## 2015-05-23 NOTE — ED Notes (Signed)
Notified hobson of low bp.

## 2015-07-06 ENCOUNTER — Emergency Department (HOSPITAL_COMMUNITY): Payer: Self-pay

## 2015-07-06 ENCOUNTER — Encounter (HOSPITAL_COMMUNITY): Payer: Self-pay | Admitting: Emergency Medicine

## 2015-07-06 ENCOUNTER — Emergency Department (HOSPITAL_COMMUNITY)
Admission: EM | Admit: 2015-07-06 | Discharge: 2015-07-06 | Disposition: A | Discharge status: Home / Self Care | Payer: Self-pay | Attending: Emergency Medicine | Admitting: Emergency Medicine

## 2015-07-06 DIAGNOSIS — Z720 Tobacco use: Secondary | ICD-10-CM | POA: Insufficient documentation

## 2015-07-06 DIAGNOSIS — E119 Type 2 diabetes mellitus without complications: Secondary | ICD-10-CM | POA: Insufficient documentation

## 2015-07-06 DIAGNOSIS — S61217A Laceration without foreign body of left little finger without damage to nail, initial encounter: Secondary | ICD-10-CM | POA: Insufficient documentation

## 2015-07-06 DIAGNOSIS — G8929 Other chronic pain: Secondary | ICD-10-CM | POA: Insufficient documentation

## 2015-07-06 DIAGNOSIS — F410 Panic disorder [episodic paroxysmal anxiety] without agoraphobia: Secondary | ICD-10-CM | POA: Insufficient documentation

## 2015-07-06 DIAGNOSIS — Z23 Encounter for immunization: Secondary | ICD-10-CM | POA: Insufficient documentation

## 2015-07-06 DIAGNOSIS — IMO0002 Reserved for concepts with insufficient information to code with codable children: Secondary | ICD-10-CM

## 2015-07-06 DIAGNOSIS — Z79899 Other long term (current) drug therapy: Secondary | ICD-10-CM | POA: Insufficient documentation

## 2015-07-06 DIAGNOSIS — Y9389 Activity, other specified: Secondary | ICD-10-CM | POA: Insufficient documentation

## 2015-07-06 DIAGNOSIS — Z7982 Long term (current) use of aspirin: Secondary | ICD-10-CM | POA: Insufficient documentation

## 2015-07-06 DIAGNOSIS — I10 Essential (primary) hypertension: Secondary | ICD-10-CM | POA: Insufficient documentation

## 2015-07-06 DIAGNOSIS — F329 Major depressive disorder, single episode, unspecified: Secondary | ICD-10-CM | POA: Insufficient documentation

## 2015-07-06 DIAGNOSIS — Y9289 Other specified places as the place of occurrence of the external cause: Secondary | ICD-10-CM | POA: Insufficient documentation

## 2015-07-06 DIAGNOSIS — W230XXA Caught, crushed, jammed, or pinched between moving objects, initial encounter: Secondary | ICD-10-CM | POA: Insufficient documentation

## 2015-07-06 DIAGNOSIS — Y998 Other external cause status: Secondary | ICD-10-CM | POA: Insufficient documentation

## 2015-07-06 MED ORDER — LIDOCAINE HCL (PF) 2 % IJ SOLN
2.0000 mL | Freq: Once | INTRAMUSCULAR | Status: AC
  Administered 2015-07-06: 2 mL
  Filled 2015-07-06: qty 10

## 2015-07-06 MED ORDER — HYDROCODONE-ACETAMINOPHEN 5-325 MG PO TABS: 1.0000 | ORAL_TABLET | ORAL | Status: DC | PRN

## 2015-07-06 MED ORDER — TETANUS-DIPHTH-ACELL PERTUSSIS 5-2.5-18.5 LF-MCG/0.5 IM SUSP
0.5000 mL | Freq: Once | INTRAMUSCULAR | Status: AC
  Administered 2015-07-06: 0.5 mL via INTRAMUSCULAR
  Filled 2015-07-06: qty 0.5

## 2015-07-06 NOTE — ED Notes (Signed)
Closed left pinky finger in car door.  Rates pain 10/10.
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
## 2015-07-06 NOTE — Discharge Instructions (Signed)

## 2016-07-17 ENCOUNTER — Encounter (HOSPITAL_COMMUNITY): Payer: Self-pay | Admitting: Emergency Medicine

## 2016-07-17 ENCOUNTER — Emergency Department (HOSPITAL_COMMUNITY)
Admission: EM | Admit: 2016-07-17 | Discharge: 2016-07-17 | Disposition: A | Discharge status: Home / Self Care | Payer: Self-pay | Attending: Emergency Medicine | Admitting: Emergency Medicine

## 2016-07-17 ENCOUNTER — Emergency Department (HOSPITAL_COMMUNITY): Payer: Self-pay

## 2016-07-17 DIAGNOSIS — I10 Essential (primary) hypertension: Secondary | ICD-10-CM | POA: Insufficient documentation

## 2016-07-17 DIAGNOSIS — Z7982 Long term (current) use of aspirin: Secondary | ICD-10-CM | POA: Insufficient documentation

## 2016-07-17 DIAGNOSIS — Z7984 Long term (current) use of oral hypoglycemic drugs: Secondary | ICD-10-CM | POA: Insufficient documentation

## 2016-07-17 DIAGNOSIS — Z79899 Other long term (current) drug therapy: Secondary | ICD-10-CM | POA: Insufficient documentation

## 2016-07-17 DIAGNOSIS — F17210 Nicotine dependence, cigarettes, uncomplicated: Secondary | ICD-10-CM | POA: Insufficient documentation

## 2016-07-17 DIAGNOSIS — R109 Unspecified abdominal pain: Secondary | ICD-10-CM | POA: Insufficient documentation

## 2016-07-17 DIAGNOSIS — E119 Type 2 diabetes mellitus without complications: Secondary | ICD-10-CM | POA: Insufficient documentation

## 2016-07-17 LAB — COMPREHENSIVE METABOLIC PANEL
ALBUMIN: 4 g/dL (ref 3.5–5.0)
ALK PHOS: 86 U/L (ref 38–126)
ALT: 15 U/L (ref 14–54)
AST: 15 U/L (ref 15–41)
Anion gap: 9 (ref 5–15)
BILIRUBIN TOTAL: 0.4 mg/dL (ref 0.3–1.2)
BUN: 16 mg/dL (ref 6–20)
CALCIUM: 8.9 mg/dL (ref 8.9–10.3)
CO2: 24 mmol/L (ref 22–32)
Chloride: 102 mmol/L (ref 101–111)
Creatinine, Ser: 0.97 mg/dL (ref 0.44–1.00)
GFR calc Af Amer: >60 mL/min (ref >60)
GFR calc non Af Amer: >60 mL/min (ref >60)
Glucose, Bld: 186 mg/dL — ABNORMAL HIGH (ref 65–99)
Potassium: 4.1 mmol/L (ref 3.5–5.1)
Sodium: 135 mmol/L (ref 135–145)
TOTAL PROTEIN: 7.3 g/dL (ref 6.5–8.1)

## 2016-07-17 LAB — CBC
HEMATOCRIT: 43.8 % (ref 36.0–46.0)
Hemoglobin: 14.1 g/dL (ref 12.0–15.0)
MCH: 30.6 pg (ref 26.0–34.0)
MCHC: 32.2 g/dL (ref 30.0–36.0)
MCV: 95 fL (ref 78.0–100.0)
Platelets: 236 10*3/uL (ref 150–400)
RBC: 4.61 MIL/uL (ref 3.87–5.11)
RDW: 13.3 % (ref 11.5–15.5)
WBC: 9.6 10*3/uL (ref 4.0–10.5)

## 2016-07-17 LAB — URINALYSIS, ROUTINE W REFLEX MICROSCOPIC
BILIRUBIN URINE: NEGATIVE
Glucose, UA: NEGATIVE mg/dL
Hgb urine dipstick: NEGATIVE
KETONES UR: NEGATIVE mg/dL
Leukocytes, UA: NEGATIVE
NITRITE: NEGATIVE
PH: 6 (ref 5.0–8.0)
Protein, ur: NEGATIVE mg/dL
Specific Gravity, Urine: 1.01 (ref 1.005–1.030)

## 2016-07-17 LAB — LIPASE, BLOOD: Lipase: 48 U/L (ref 11–51)

## 2016-07-17 MED ORDER — IOPAMIDOL (ISOVUE-300) INJECTION 61%
100.0000 mL | Freq: Once | INTRAVENOUS | Status: AC | PRN
  Administered 2016-07-17: 100 mL via INTRAVENOUS

## 2016-07-17 MED ORDER — DICYCLOMINE HCL 20 MG PO TABS: 20.0000 mg | ORAL_TABLET | Freq: Four times a day (QID) | ORAL | 0 refills | Status: DC | PRN

## 2016-07-17 MED ORDER — ONDANSETRON HCL 4 MG PO TABS: 4.0000 mg | ORAL_TABLET | Freq: Three times a day (TID) | ORAL | 0 refills | Status: DC | PRN

## 2016-07-17 MED ORDER — MORPHINE SULFATE (PF) 4 MG/ML IV SOLN
4.0000 mg | INTRAVENOUS | Status: DC | PRN
  Administered 2016-07-17: 4 mg via INTRAVENOUS
  Filled 2016-07-17: qty 1

## 2016-07-17 MED ORDER — ONDANSETRON HCL 4 MG/2ML IJ SOLN
4.0000 mg | INTRAMUSCULAR | Status: DC | PRN
  Administered 2016-07-17: 4 mg via INTRAVENOUS
  Filled 2016-07-17: qty 2

## 2016-07-17 NOTE — Discharge Instructions (Signed)
Eat a bland diet, avoiding greasy, fatty, fried foods, as well as spicy and acidic foods or beverages.  Avoid eating within the hour or 2 before going to bed or laying down.  Also avoid teas, colas, coffee, chocolate, pepermint and spearment.  Take over the counter pepcid, one tablet by mouth twice a day, for the next 2 to 3 weeks.  May also take over the counter maalox/mylanta, as directed on packaging, as needed for discomfort.  Take the prescriptions as directed.  Call your regular medical doctor and the GI doctor tomorrow to schedule a follow up appointment within the next week.  Return to the Emergency Department immediately if worsening.

## 2016-07-17 NOTE — ED Provider Notes (Signed)
[facility] DEPT Provider Note   CSN: [id number] Arrival date & time: 07/17/16  0745     History   Chief Complaint Chief Complaint  Patient presents with  . Abdominal Pain    HPI Katelyn Kelly is a 55 y.o. female.   Abdominal Pain      Pt was seen at 0800.   Per pt, c/o gradual onset and persistence of constant generalized abd "pain" for the past 1 month. Describes the abd pain as "pressure" and "bloating," which radiates into the left side of her back.  Denies N/V, no diarrhea, no fevers, no rash, no CP/SOB, no black or blood in stools, no dysuria/hematuria. Pt states she was evaluated by her PMD this past week and was told "maybe I had a hernia."      Past Medical History:  Diagnosis Date  . Anxiety and depression   . Bilateral carpal tunnel syndrome   . Chronic back pain   . Chronic neck pain   . Diabetes mellitus   . H/O syncope   . Hypertension   . Manic depression ([place])   . Panic attack     Patient Active Problem List   Diagnosis Date Noted  . LATERAL EPICONDYLITIS 08/27/2009  . JOINT PAIN, HAND 07/30/2009  . TRIGGER FINGER 07/30/2009  . CARPAL TUNNEL SYNDROME 03/04/2009  . CERVICAL RADICULITIS 03/04/2009  . NUMBNESS, HAND 02/02/2009    Past Surgical History:  Procedure Laterality Date  . ABDOMINAL HYSTERECTOMY    . ABDOMINAL SURGERY    . APPENDECTOMY    . CARPAL TUNNEL RELEASE    . CHOLECYSTECTOMY    . TUBAL LIGATION      OB History    Gravida Para Term Preterm AB Living   4 2 2   2 2    SAB TAB Ectopic Multiple Live Births   2               Home Medications    Prior to Admission medications   Medication Sig Start Date End Date Taking? Authorizing Provider  acetaminophen (TYLENOL) 500 MG tablet Take 1,000 mg by mouth every 6 (six) hours as needed for mild pain or moderate pain.    Historical Provider, MD  ALPRAZolam Duanne Moron) 0.5 MG tablet Take 1 mg by mouth 3 (three) times daily.    Historical Provider, MD  aspirin EC 81 MG tablet  Take 81 mg by mouth at bedtime.    Historical Provider, MD  citalopram (CELEXA) 20 MG tablet Take 1 tablet (20 mg total) by mouth daily. 08/05/14   Rolland Porter, MD  HYDROcodone-acetaminophen (NORCO/VICODIN) 5-325 MG per tablet Take 1 tablet by mouth every 4 (four) hours as needed. 07/06/15   Evalee Jefferson, PA-C  lisinopril (PRINIVIL,ZESTRIL) 10 MG tablet Take 10 mg by mouth daily.    Historical Provider, MD  metaxalone (SKELAXIN) 400 MG tablet Take 1 tablet (400 mg total) by mouth 3 (three) times daily as needed for muscle spasms. 05/20/15   Davonna Belling, MD  metFORMIN (GLUCOPHAGE) 500 MG tablet Take 500 mg by mouth 2 (two) times daily with a meal.    Historical Provider, MD  methocarbamol (ROBAXIN) 500 MG tablet Take 1 tablet (500 mg total) by mouth 3 (three) times daily. 05/23/15   Lily Kocher, PA-C    Family History Family History  Problem Relation Age of Onset  . Diabetes Mother   . Stroke Mother   . Cancer Father   . Cancer Brother     Social History  Social History  Substance Use Topics  . Smoking status: Current Every Day Smoker    Packs/day: 1.50    Years: 40.00    Types: Cigarettes  . Smokeless tobacco: Never Used  . Alcohol use No     Allergies   Methylphenidate derivatives; Sinequan [doxepin hcl]; and Tranxene [clorazepate dipotassium]   Review of Systems Review of Systems  Gastrointestinal: Positive for abdominal pain.  ROS: Statement: All systems negative except as marked or noted in the HPI; Constitutional: Negative for fever and chills. ; ; Eyes: Negative for eye pain, redness and discharge. ; ; ENMT: Negative for ear pain, hoarseness, nasal congestion, sinus pressure and sore throat. ; ; Cardiovascular: Negative for chest pain, palpitations, diaphoresis, dyspnea and peripheral edema. ; ; Respiratory: Negative for cough, wheezing and stridor. ; ; Gastrointestinal: +abd pain. Negative for nausea, vomiting, diarrhea, blood in stool, hematemesis, jaundice and rectal  bleeding. . ; ; Genitourinary: Negative for dysuria and hematuria. ; ; Musculoskeletal: +back pain. Negative for neck pain. Negative for swelling and trauma.; ; Skin: Negative for pruritus, rash, abrasions, blisters, bruising and skin lesion.; ; Neuro: Negative for headache, lightheadedness and neck stiffness. Negative for weakness, altered level of consciousness, altered mental status, extremity weakness, paresthesias, involuntary movement, seizure and syncope.      Physical Exam Updated Vital Signs BP 127/77   Pulse 91   Temp 97.6 F (36.4 C) (Oral)   Resp 18   Ht 5' 1.5" (1.562 m)   Wt 180 lb (81.6 kg)   SpO2 98%   BMI 33.46 kg/m   Physical Exam Physical examination:  Nursing notes reviewed; Vital signs and O2 SAT reviewed;  Constitutional: Well developed, Well nourished, Well hydrated, In no acute distress; Head:  Normocephalic, atraumatic; Eyes: EOMI, PERRL, No scleral icterus; ENMT: Mouth and pharynx normal, Mucous membranes moist; Neck: Supple, Full range of motion, No lymphadenopathy; Cardiovascular: Regular rate and rhythm, No gallop; Respiratory: Breath sounds clear & equal bilaterally, No wheezes.  Speaking full sentences with ease, Normal respiratory effort/excursion; Chest: Nontender, Movement normal; Abdomen: +diffuse tenderness to palp, +softly distended. Normal bowel sounds; Genitourinary: No CVA tenderness; Extremities: Pulses normal, No tenderness, No edema, No calf edema or asymmetry.; Neuro: AA&Ox3, Major CN grossly intact.  Speech clear. No gross focal motor or sensory deficits in extremities.; Skin: Color normal, Warm, Dry.   ED Treatments / Results  Labs (all labs ordered are listed, but only abnormal results are displayed)   EKG  EKG Interpretation None       Radiology   Procedures Procedures (including critical care time)  Medications Ordered in ED Medications - No data to display   Initial Impression / Assessment and Plan / ED Course  I have  reviewed the triage vital signs and the nursing notes.  Pertinent labs & imaging results that were available during my care of the patient were reviewed by me and considered in my medical decision making (see chart for details).  MDM Reviewed: previous chart, nursing note and vitals Reviewed previous: labs Interpretation: labs, x-ray and CT scan   Results for orders placed or performed during the hospital encounter of 07/17/16  Lipase, blood  Result Value Ref Range   Lipase 48 11 - 51 U/L  Comprehensive metabolic panel  Result Value Ref Range   Sodium 135 135 - 145 mmol/L   Potassium 4.1 3.5 - 5.1 mmol/L   Chloride 102 101 - 111 mmol/L   CO2 24 22 - 32 mmol/L   Glucose, Bld  186 (H) 65 - 99 mg/dL   BUN 16 6 - 20 mg/dL   Creatinine, Ser 0.97 0.44 - 1.00 mg/dL   Calcium 8.9 8.9 - 10.3 mg/dL   Total Protein 7.3 6.5 - 8.1 g/dL   Albumin 4.0 3.5 - 5.0 g/dL   AST 15 15 - 41 U/L   ALT 15 14 - 54 U/L   Alkaline Phosphatase 86 38 - 126 U/L   Total Bilirubin 0.4 0.3 - 1.2 mg/dL   GFR calc non Af Amer >60 >60 mL/min   GFR calc Af Amer >60 >60 mL/min   Anion gap 9 5 - 15  CBC  Result Value Ref Range   WBC 9.6 4.0 - 10.5 K/uL   RBC 4.61 3.87 - 5.11 MIL/uL   Hemoglobin 14.1 12.0 - 15.0 g/dL   HCT 43.8 36.0 - 46.0 %   MCV 95.0 78.0 - 100.0 fL   MCH 30.6 26.0 - 34.0 pg   MCHC 32.2 30.0 - 36.0 g/dL   RDW 13.3 11.5 - 15.5 %   Platelets 236 150 - 400 K/uL  Urinalysis, Routine w reflex microscopic  Result Value Ref Range   Color, Urine YELLOW YELLOW   APPearance CLEAR CLEAR   Specific Gravity, Urine 1.010 1.005 - 1.030   pH 6.0 5.0 - 8.0   Glucose, UA NEGATIVE NEGATIVE mg/dL   Hgb urine dipstick NEGATIVE NEGATIVE   Bilirubin Urine NEGATIVE NEGATIVE   Ketones, ur NEGATIVE NEGATIVE mg/dL   Protein, ur NEGATIVE NEGATIVE mg/dL   Nitrite NEGATIVE NEGATIVE   Leukocytes, UA NEGATIVE NEGATIVE   Dg Chest 2 View Result Date: 07/17/2016 CLINICAL DATA:  Patient with abdominal pain and  distension. EXAM: CHEST  2 VIEW COMPARISON:  Chest radiograph 08/05/2014 FINDINGS: Cardiac contours upper limits of normal. No consolidative pulmonary opacities. No pleural effusion or pneumothorax. Regional skeleton is unremarkable. Upper abdominal surgical clips. IMPRESSION: No active cardiopulmonary disease. Electronically Signed   By: Lovey Newcomer M.D.   On: 07/17/2016 08:49   Ct Abdomen Pelvis W Contrast Result Date: 07/17/2016 CLINICAL DATA:  55 year old female with left-sided abdominal and pelvic pain for 2 months. EXAM: CT ABDOMEN AND PELVIS WITH CONTRAST TECHNIQUE: Multidetector CT imaging of the abdomen and pelvis was performed using the standard protocol following bolus administration of intravenous contrast. CONTRAST:  137mL ISOVUE-300 IOPAMIDOL (ISOVUE-300) INJECTION 61% COMPARISON:  05/23/2015 FINDINGS: Lower chest: No acute abnormality.  Cardiomegaly identified. Hepatobiliary: The liver is unremarkable. The patient is status post cholecystectomy. There is no evidence of biliary dilatation. Pancreas: Unremarkable Spleen: Unremarkable Adrenals/Urinary Tract: Mild bilateral renal cortical thinning noted. There is no evidence of hydronephrosis or renal mass. Bilateral adrenal adenomas again noted. The bladder is unremarkable. Stomach/Bowel: Unremarkable. No bowel obstruction or definite bowel wall thickening. Vascular/Lymphatic: Aortic atherosclerotic calcifications noted without aneurysm. No enlarged lymph nodes identified. Reproductive: Patient is status post hysterectomy. No adnexal masses noted. Other: No free fluid, focal collection/abscess or pneumoperitoneum. Musculoskeletal: No acute or suspicious abnormality. Moderate degenerative disc disease at L 3-4 and L5-S1 again identified. IMPRESSION: No evidence of acute abnormality. No abnormalities identified to suggest a cause for this patient's left abdominal pain. Cardiomegaly and bilateral adrenal adenomas. Abdominal aortic atherosclerosis.  Electronically Signed   By: Margarette Canada M.D.   On: 07/17/2016 09:38     1015:  Pt has tol PO well while in the ED without N/V.  No stooling while in the ED.  Abd benign, VSS. Feels better and wants to go home now.  Workup  reassuring. Tx symptomatically, f/u GI MD. Dx and testing d/w pt.  Questions answered.  Verb understanding, agreeable to d/c home with outpt f/u.    Final Clinical Impressions(s) / ED Diagnoses   Final diagnoses:  None    New Prescriptions New Prescriptions   No medications on file      Francine Graven, DO 07/20/16 1658

## 2016-07-17 NOTE — ED Notes (Signed)
Patient c/o abd distention and left flank pain x3 weeks. Per patient last normal BM yesterday-no blood noted. Per patient feels like knots in upper abd and has bulge in abd when sitting forward.

## 2017-01-21 ENCOUNTER — Emergency Department (HOSPITAL_COMMUNITY): Payer: Medicaid Other

## 2017-01-21 ENCOUNTER — Emergency Department (HOSPITAL_COMMUNITY)
Admission: EM | Admit: 2017-01-21 | Discharge: 2017-01-21 | Disposition: A | Discharge status: Home / Self Care | Payer: Medicaid Other | Attending: Emergency Medicine | Admitting: Emergency Medicine

## 2017-01-21 ENCOUNTER — Encounter (HOSPITAL_COMMUNITY): Payer: Self-pay | Admitting: Emergency Medicine

## 2017-01-21 DIAGNOSIS — E119 Type 2 diabetes mellitus without complications: Secondary | ICD-10-CM | POA: Insufficient documentation

## 2017-01-21 DIAGNOSIS — Z7982 Long term (current) use of aspirin: Secondary | ICD-10-CM | POA: Insufficient documentation

## 2017-01-21 DIAGNOSIS — I10 Essential (primary) hypertension: Secondary | ICD-10-CM | POA: Diagnosis not present

## 2017-01-21 DIAGNOSIS — Z79899 Other long term (current) drug therapy: Secondary | ICD-10-CM | POA: Diagnosis not present

## 2017-01-21 DIAGNOSIS — M5442 Lumbago with sciatica, left side: Secondary | ICD-10-CM | POA: Diagnosis not present

## 2017-01-21 DIAGNOSIS — Z7984 Long term (current) use of oral hypoglycemic drugs: Secondary | ICD-10-CM | POA: Insufficient documentation

## 2017-01-21 DIAGNOSIS — F17210 Nicotine dependence, cigarettes, uncomplicated: Secondary | ICD-10-CM | POA: Diagnosis not present

## 2017-01-21 DIAGNOSIS — M545 Low back pain: Secondary | ICD-10-CM | POA: Diagnosis present

## 2017-01-21 LAB — URINALYSIS, ROUTINE W REFLEX MICROSCOPIC
Bilirubin Urine: NEGATIVE
Glucose, UA: NEGATIVE mg/dL
HGB URINE DIPSTICK: NEGATIVE
KETONES UR: NEGATIVE mg/dL
LEUKOCYTES UA: NEGATIVE
Nitrite: NEGATIVE
PH: 5 (ref 5.0–8.0)
PROTEIN: NEGATIVE mg/dL
Specific Gravity, Urine: 1.009 (ref 1.005–1.030)

## 2017-01-21 MED ORDER — KETOROLAC TROMETHAMINE 30 MG/ML IJ SOLN
30.0000 mg | Freq: Once | INTRAMUSCULAR | Status: AC
  Administered 2017-01-21: 30 mg via INTRAMUSCULAR
  Filled 2017-01-21: qty 1

## 2017-01-21 MED ORDER — IBUPROFEN 800 MG PO TABS: 800.0000 mg | ORAL_TABLET | Freq: Three times a day (TID) | ORAL | 0 refills | Status: AC | PRN

## 2017-01-21 MED ORDER — METHOCARBAMOL 500 MG PO TABS: 500.0000 mg | ORAL_TABLET | Freq: Two times a day (BID) | ORAL | 0 refills | Status: DC

## 2017-01-21 MED ORDER — METHOCARBAMOL 500 MG PO TABS
500.0000 mg | ORAL_TABLET | Freq: Once | ORAL | Status: AC
  Administered 2017-01-21: 500 mg via ORAL
  Filled 2017-01-21: qty 1

## 2017-01-21 NOTE — ED Provider Notes (Signed)
Emergency Department Provider Note  By signing my name below, I, Higinio Plan, attest that this documentation has been prepared under the direction and in the presence of Margette Fast, MD . Electronically Signed: Higinio Plan, Scribe. 01/21/2017. 9:51 AM.  I have reviewed the triage vital signs and the nursing notes.  HISTORY  Chief Complaint Tailbone Pain  HPI Comments: Katelyn Kelly is a 56 y.o. female with PMHx of DM and HTN, who presents to the Emergency Department complaining of gradually worsening, left-sided lower back pain that began yesterday afternoon after waking up from a nap. Pt reports her pain radiates down into her left leg with ambulation that "throws her off her balance." She notes associated chills due to pain. She states she has taken Extra Strength Tylenol at home for her pain with no relief. Pt denies any numbness or weakness in her extremities, recent changes in her medications, dysuria, difficulty urinating, saddle anaesthesia, fever, headache, recent injury to her back, and hx of sciatica or kidney stones.   Past Medical History:  Diagnosis Date  . Anxiety and depression   . Bilateral carpal tunnel syndrome   . Chronic back pain   . Chronic neck pain   . Diabetes mellitus   . H/O syncope   . Hypertension   . Manic depression ([place])   . Panic attack     Patient Active Problem List   Diagnosis Date Noted  . LATERAL EPICONDYLITIS 08/27/2009  . JOINT PAIN, HAND 07/30/2009  . TRIGGER FINGER 07/30/2009  . CARPAL TUNNEL SYNDROME 03/04/2009  . CERVICAL RADICULITIS 03/04/2009  . NUMBNESS, HAND 02/02/2009    Past Surgical History:  Procedure Laterality Date  . ABDOMINAL HYSTERECTOMY    . ABDOMINAL SURGERY    . APPENDECTOMY    . CARPAL TUNNEL RELEASE    . CHOLECYSTECTOMY    . TUBAL LIGATION      Current Outpatient Rx  . Order #: [id number] Class: Historical Med  . Order #: [id number] Class: Historical Med  . Order #: [id number] Class: Historical Med  .  Order #: [id number] Class: Print  . Order #: [id number] Class: Historical Med  . Order #: [id number] Class: Historical Med  . Order #: [id number] Class: Historical Med  . Order #: [id number] Class: Print  . Order #: [id number] Class: Print    Allergies Sinequan [doxepin hcl]; Methylphenidate derivatives; and Tranxene [clorazepate dipotassium]  Family History  Problem Relation Age of Onset  . Diabetes Mother   . Stroke Mother   . Cancer Father   . Cancer Brother     Social History Social History  Substance Use Topics  . Smoking status: Current Every Day Smoker    Packs/day: 1.50    Years: 40.00    Types: Cigarettes  . Smokeless tobacco: Never Used  . Alcohol use No    Review of Systems  Constitutional: No fever/chills Eyes: No visual changes. ENT: No sore throat. Cardiovascular: Denies chest pain. Respiratory: Denies shortness of breath. Gastrointestinal: No abdominal pain.  No nausea, no vomiting.  No diarrhea.  No constipation. Genitourinary: Negative for dysuria. Musculoskeletal: Positive for back pain.  Skin: Negative for rash. Neurological: Negative for headaches, focal weakness or numbness.  10-point ROS otherwise negative.  ____________________________________________   PHYSICAL EXAM:  VITAL SIGNS: ED Triage Vitals  Enc Vitals Group     BP 01/21/17 0926 (!) 138/115     Pulse Rate 01/21/17 0926 92     Resp 01/21/17 0926 18     Temp 01/21/17 0926 97.8 F (36.6 C)  Temp src --      SpO2 01/21/17 0926 97 %     Weight 01/21/17 0925 180 lb (81.6 kg)     Height 01/21/17 0925 5' 1.5" (1.562 m)     Pain Score 01/21/17 0925 10   Constitutional: Alert and oriented. Well appearing and in no acute distress. Eyes: Conjunctivae are normal.  Head: Atraumatic. Nose: No congestion/rhinnorhea. Mouth/Throat: Mucous membranes are moist.  Oropharynx non-erythematous. Neck: No stridor.  Cardiovascular: Normal rate, regular rhythm. Good peripheral circulation. Grossly normal  heart sounds.   Respiratory: Normal respiratory effort.  No retractions. Lungs CTAB. Gastrointestinal: Soft and nontender. No distention.  Musculoskeletal: No lower extremity tenderness nor edema. No gross deformities of extremities. Neurologic:  Normal speech and language. No gross focal neurologic deficits are appreciated. 2+ bilateral patellar reflexes. Normal strength and sensation in the LEs bilaterally. Normal gait.  Skin:  Skin is warm, dry and intact. No rash noted. Psychiatric: Mood and affect are normal. Speech and behavior are normal.  ____________________________________________  RADIOLOGY  Dg Lumbar Spine Complete  Result Date: 01/21/2017 CLINICAL DATA:  No injury.  Pain.  LEFT hip and leg radiation. EXAM: LUMBAR SPINE - COMPLETE 4+ VIEW COMPARISON:  CT abdomen and pelvis 07/17/2016. FINDINGS: Anatomic alignment is present. There is severe disc space narrowing at L5-S1 and moderate disc space narrowing at L3-4. Osseous ridging/calcified protrusions are seen at both levels projecting posteriorly. Lower lumbar facet arthropathy is superimposed. No pars defects. No worrisome osseous findings. IMPRESSION: Disc pathology at L3-4 and/or L5-S1 could be symptomatic. Similar appearance to priors. Electronically Signed   By: Staci Righter M.D.   On: 01/21/2017 10:15   ____________________________________________   PROCEDURES  Procedure(s) performed:   Procedures  None ____________________________________________   INITIAL IMPRESSION / ASSESSMENT AND PLAN / ED COURSE  Pertinent labs & imaging results that were available during my care of the patient were reviewed by me and considered in my medical decision making (see chart for details).  Patient presents to the ED for evaluation of back pain with left leg sciatica. No red flag symptoms to suggest spinal cord emergency. X-ray with no evidence of fracture or lesions. Normal UA. Pain improved with Toradol and Robaxin. Plan for  discharge with continued supportive care at home. Discussed PCP follow up. Discussed staying active.   At this time, I do not feel there is any life-threatening condition present. I have reviewed and discussed all results (EKG, imaging, lab, urine as appropriate), exam findings with patient. I have reviewed nursing notes and appropriate previous records.  I feel the patient is safe to be discharged home without further emergent workup. Discussed usual and customary return precautions. Patient and family (if present) verbalize understanding and are comfortable with this plan.  Patient will follow-up with their primary care provider. If they do not have a primary care provider, information for follow-up has been provided to them. All questions have been answered.  ____________________________________________  FINAL CLINICAL IMPRESSION(S) / ED DIAGNOSES  Final diagnoses:  Acute midline low back pain with left-sided sciatica   MEDICATIONS GIVEN DURING THIS VISIT:  Medications  ketorolac (TORADOL) 30 MG/ML injection 30 mg (30 mg Intramuscular Given 01/21/17 1014)  methocarbamol (ROBAXIN) tablet 500 mg (500 mg Oral Given 01/21/17 1014)    NEW OUTPATIENT MEDICATIONS STARTED DURING THIS VISIT:  Discharge Medication List as of 01/21/2017 11:46 AM    START taking these medications   Details  ibuprofen (ADVIL,MOTRIN) 800 MG tablet Take 1 tablet (800 mg total) by  mouth every 8 (eight) hours as needed., Starting Sat 01/21/2017, Until Thu 01/26/2017, Print    methocarbamol (ROBAXIN) 500 MG tablet Take 1 tablet (500 mg total) by mouth 2 (two) times daily., Starting Sat 01/21/2017, Print       Note:  This document was prepared using Dragon voice recognition software and may include unintentional dictation errors.  Nanda Quinton, MD Emergency Medicine  I personally performed the services described in this documentation, which was scribed in my presence. The recorded information has been reviewed and is  accurate.      Margette Fast, MD 01/21/17 1901

## 2017-01-21 NOTE — Discharge Instructions (Signed)
You have been seen in the Emergency Department (ED)  today for back pain.  Your workup and exam have not shown any acute abnormalities and you are likely suffering from muscle strain or possible problems with your discs, but there is no treatment that will fix your symptoms at this time.  Please take Motrin (ibuprofen) as needed for your pain according to the instructions written on the box.    Take Robaxin as prescribed for severe pain. Do not drink alcohol, drive or participate in any other potentially dangerous activities while taking this medication as it may make you sleepy. Do not take this medication with any other sedating medications, either prescription or over-the-counter.   Please follow up with your doctor as soon as possible regarding today's ED visit and your back pain.  Return to the ED for worsening back pain, fever, weakness or numbness of either leg, or if you develop either (1) an inability to urinate or have bowel movements, or (2) loss of your ability to control your bathroom functions (if you start having "accidents"), or if you develop other new symptoms that concern you.

## 2017-01-21 NOTE — ED Triage Notes (Signed)
Pt c/o left lower back pain radiating down left leg since getting up from nap yesterday afternoon. Denies injury. Denies gi/gu sx.

## 2017-04-15 ENCOUNTER — Emergency Department (HOSPITAL_COMMUNITY): Payer: Medicare Other

## 2017-04-15 ENCOUNTER — Emergency Department (HOSPITAL_COMMUNITY)
Admission: EM | Admit: 2017-04-15 | Discharge: 2017-04-15 | Disposition: A | Discharge status: Home / Self Care | Payer: Medicare Other | Attending: Emergency Medicine | Admitting: Emergency Medicine

## 2017-04-15 ENCOUNTER — Encounter (HOSPITAL_COMMUNITY): Payer: Self-pay | Admitting: Adult Health

## 2017-04-15 DIAGNOSIS — Z79899 Other long term (current) drug therapy: Secondary | ICD-10-CM | POA: Diagnosis not present

## 2017-04-15 DIAGNOSIS — Z7982 Long term (current) use of aspirin: Secondary | ICD-10-CM | POA: Diagnosis not present

## 2017-04-15 DIAGNOSIS — Z7984 Long term (current) use of oral hypoglycemic drugs: Secondary | ICD-10-CM | POA: Diagnosis not present

## 2017-04-15 DIAGNOSIS — R109 Unspecified abdominal pain: Secondary | ICD-10-CM

## 2017-04-15 DIAGNOSIS — R1032 Left lower quadrant pain: Secondary | ICD-10-CM | POA: Diagnosis not present

## 2017-04-15 DIAGNOSIS — F17210 Nicotine dependence, cigarettes, uncomplicated: Secondary | ICD-10-CM | POA: Diagnosis not present

## 2017-04-15 DIAGNOSIS — E119 Type 2 diabetes mellitus without complications: Secondary | ICD-10-CM | POA: Insufficient documentation

## 2017-04-15 DIAGNOSIS — I10 Essential (primary) hypertension: Secondary | ICD-10-CM | POA: Diagnosis not present

## 2017-04-15 LAB — URINALYSIS, ROUTINE W REFLEX MICROSCOPIC
Bilirubin Urine: NEGATIVE
Glucose, UA: NEGATIVE mg/dL
Hgb urine dipstick: NEGATIVE
Ketones, ur: NEGATIVE mg/dL
Leukocytes, UA: NEGATIVE
Nitrite: NEGATIVE
Protein, ur: NEGATIVE mg/dL
Specific Gravity, Urine: 1.013 (ref 1.005–1.030)
pH: 5 (ref 5.0–8.0)

## 2017-04-15 LAB — CBC WITH DIFFERENTIAL/PLATELET
Basophils Absolute: 0.1 10*3/uL (ref 0.0–0.1)
Basophils Relative: 1 %
Eosinophils Absolute: 0.2 10*3/uL (ref 0.0–0.7)
Eosinophils Relative: 2 %
HCT: 45.5 % (ref 36.0–46.0)
Hemoglobin: 14.9 g/dL (ref 12.0–15.0)
Lymphocytes Relative: 32 %
Lymphs Abs: 3.5 10*3/uL (ref 0.7–4.0)
MCH: 31.3 pg (ref 26.0–34.0)
MCHC: 32.7 g/dL (ref 30.0–36.0)
MCV: 95.6 fL (ref 78.0–100.0)
Monocytes Absolute: 0.7 10*3/uL (ref 0.1–1.0)
Monocytes Relative: 6 %
Neutro Abs: 6.5 10*3/uL (ref 1.7–7.7)
Neutrophils Relative %: 59 %
Platelets: 249 10*3/uL (ref 150–400)
RBC: 4.76 MIL/uL (ref 3.87–5.11)
RDW: 13.3 % (ref 11.5–15.5)
WBC: 10.9 10*3/uL — ABNORMAL HIGH (ref 4.0–10.5)

## 2017-04-15 LAB — BASIC METABOLIC PANEL
Anion gap: 10 (ref 5–15)
BUN: 7 mg/dL (ref 6–20)
CO2: 26 mmol/L (ref 22–32)
Calcium: 9 mg/dL (ref 8.9–10.3)
Chloride: 105 mmol/L (ref 101–111)
Creatinine, Ser: 0.82 mg/dL (ref 0.44–1.00)
GFR calc Af Amer: >60 mL/min (ref >60)
GFR calc non Af Amer: >60 mL/min (ref >60)
Glucose, Bld: 126 mg/dL — ABNORMAL HIGH (ref 65–99)
Potassium: 4.1 mmol/L (ref 3.5–5.1)
Sodium: 141 mmol/L (ref 135–145)

## 2017-04-15 MED ORDER — ONDANSETRON HCL 4 MG/2ML IJ SOLN
4.0000 mg | Freq: Once | INTRAMUSCULAR | Status: AC
  Administered 2017-04-15: 4 mg via INTRAVENOUS
  Filled 2017-04-15: qty 2

## 2017-04-15 MED ORDER — SODIUM CHLORIDE 0.9 % IV BOLUS (SEPSIS)
1000.0000 mL | Freq: Once | INTRAVENOUS | Status: AC
  Administered 2017-04-15: 1000 mL via INTRAVENOUS

## 2017-04-15 MED ORDER — KETOROLAC TROMETHAMINE 30 MG/ML IJ SOLN
15.0000 mg | Freq: Once | INTRAMUSCULAR | Status: AC
  Administered 2017-04-15: 15 mg via INTRAVENOUS
  Filled 2017-04-15: qty 1

## 2017-04-15 MED ORDER — HYDROMORPHONE HCL 1 MG/ML IJ SOLN
1.0000 mg | Freq: Once | INTRAMUSCULAR | Status: AC
  Administered 2017-04-15: 1 mg via INTRAVENOUS
  Filled 2017-04-15: qty 1

## 2017-04-15 NOTE — ED Notes (Signed)
Patient transported to CT 

## 2017-04-15 NOTE — ED Triage Notes (Signed)
PResents with left sided flank pain that radiaties into her left lower abdomen. Per pr "It feels like a appendix attack". She reports the pain is ongoing and constant but varies in intensity. She has been seen multiple times for same pain, this one she states is worse. She is tearful. Denies difficulty urination, denies vomiting endorses nausea. LAst BM yesterday and loose, but normal for her.

## 2017-04-15 NOTE — ED Notes (Signed)
Pt returned from CT °

## 2017-04-15 NOTE — ED Triage Notes (Signed)
L flank/back pain since last night - recurrent  Dr Maudie Mercury is PCP

## 2017-04-27 NOTE — ED Provider Notes (Signed)
[facility] DEPT Provider Note   CSN: [id number] Arrival date & time: 04/15/17  1507     History   Chief Complaint Chief Complaint  Patient presents with  . Flank Pain    HPI Katelyn Kelly is a 56 y.o. female.  HPI   56 year old female with left lower quadrant/left flank pain. Patient has similar pain intermittently for for years. She says the pain feels like "appendix attack." Pain is sharp at times and other times more dull. No urinary complaints. Nausea, no vomiting. No diarrhea.  Past Medical History:  Diagnosis Date  . Anxiety and depression   . Bilateral carpal tunnel syndrome   . Chronic back pain   . Chronic neck pain   . Diabetes mellitus   . H/O syncope   . Hypertension   . Manic depression ([place])   . Panic attack     Patient Active Problem List   Diagnosis Date Noted  . LATERAL EPICONDYLITIS 08/27/2009  . JOINT PAIN, HAND 07/30/2009  . TRIGGER FINGER 07/30/2009  . CARPAL TUNNEL SYNDROME 03/04/2009  . CERVICAL RADICULITIS 03/04/2009  . NUMBNESS, HAND 02/02/2009    Past Surgical History:  Procedure Laterality Date  . ABDOMINAL HYSTERECTOMY    . ABDOMINAL SURGERY    . APPENDECTOMY    . CARPAL TUNNEL RELEASE    . CHOLECYSTECTOMY    . TUBAL LIGATION      OB History    Gravida Para Term Preterm AB Living   4 2 2   2 2    SAB TAB Ectopic Multiple Live Births   2               Home Medications    Prior to Admission medications   Medication Sig Start Date End Date Taking? Authorizing Provider  acetaminophen (TYLENOL) 500 MG tablet Take 1,500 mg by mouth every 6 (six) hours as needed for mild pain or moderate pain.    Yes [provider]  ALPRAZolam (XANAX) 0.25 MG tablet Take 0.25 mg by mouth 2 (two) times daily.   Yes [provider]  aspirin EC 81 MG tablet Take 81 mg by mouth at bedtime.   Yes [provider]  citalopram (CELEXA) 40 MG tablet Take 40 mg by mouth daily.   Yes [provider]    gabapentin (NEURONTIN) 100 MG capsule Take 100 mg by mouth 3 (three) times daily.   Yes [provider]  hydrOXYzine (ATARAX/VISTARIL) 25 MG tablet Take 25 mg by mouth at bedtime as needed for anxiety.   Yes [provider]  Hyprom-Naphaz-Polysorb-Zn Sulf (CLEAR EYES COMPLETE OP) Apply 1-2 drops to eye 2 (two) times daily as needed.   Yes [provider]  lisinopril (PRINIVIL,ZESTRIL) 10 MG tablet Take 10 mg by mouth daily.   Yes [provider]  metFORMIN (GLUCOPHAGE) 500 MG tablet Take 500 mg by mouth 2 (two) times daily with a meal.   Yes [provider]    Family History Family History  Problem Relation Age of Onset  . Diabetes Mother   . Stroke Mother   . Cancer Father   . Cancer Brother     Social History Social History  Substance Use Topics  . Smoking status: Current Every Day Smoker    Packs/day: 1.50    Years: 40.00    Types: Cigarettes  . Smokeless tobacco: Never Used  . Alcohol use No     Allergies   Sinequan [doxepin hcl]; Methylphenidate derivatives; and Tranxene [clorazepate dipotassium]  Review of Systems Review of Systems  All systems reviewed and negative, other than as noted in HPI.  Physical Exam Updated Vital Signs BP 124/81   Pulse 64 Comment: on RA  Temp 98.1 F (36.7 C) (Oral)   Resp (!) 22   Wt 81.6 kg (180 lb)   SpO2 95% Comment: on RA  BMI 33.46 kg/m   Physical Exam  Constitutional: She appears well-developed and well-nourished. No distress.  HENT:  Head: Normocephalic and atraumatic.  Eyes: Conjunctivae are normal. Right eye exhibits no discharge. Left eye exhibits no discharge.  Neck: Neck supple.  Cardiovascular: Normal rate, regular rhythm and normal heart sounds.  Exam reveals no gallop and no friction rub.   No murmur heard. Pulmonary/Chest: Effort normal and breath sounds normal. No respiratory distress.  Abdominal: Soft. She exhibits no distension. There is tenderness.  LLQ/L  flank tenderness. No rebound or guarding. No cva tenderness.   Musculoskeletal: She exhibits no edema or tenderness.  Neurological: She is alert.  Skin: Skin is warm and dry.  Psychiatric: She has a normal mood and affect. Her behavior is normal. Thought content normal.  Nursing note and vitals reviewed.     ED Treatments / Results  Labs (all labs ordered are listed, but only abnormal results are displayed) Labs Reviewed  CBC WITH DIFFERENTIAL/PLATELET - Abnormal; Notable for the following:       Result Value   WBC 10.9 (*)    All other components within normal limits  BASIC METABOLIC PANEL - Abnormal; Notable for the following:    Glucose, Bld 126 (*)    All other components within normal limits  URINALYSIS, ROUTINE W REFLEX MICROSCOPIC    EKG  EKG Interpretation None       Radiology No results found.   Ct Abdomen Pelvis Wo Contrast  Result Date: 04/15/2017 CLINICAL DATA:  Left flank pain radiating to the left lower abdomen. Prior appendectomy, hysterectomy and cholecystectomy. EXAM: CT ABDOMEN AND PELVIS WITHOUT CONTRAST TECHNIQUE: Multidetector CT imaging of the abdomen and pelvis was performed following the standard protocol without IV contrast. COMPARISON:  07/17/2016 CT abdomen/ pelvis. FINDINGS: Lower chest: Stable mosaic attenuation at the lung bases. No acute abnormality at the lung bases. Stable trace pericardial effusion/thickening. Hepatobiliary: Stable heterogeneous hepatic steatosis and mild hepatomegaly. No definite liver surface irregularity. No discrete liver mass. Cholecystectomy. No biliary ductal dilatation. Pancreas: Normal, with no mass or duct dilation. Spleen: Normal size. No mass. Adrenals/Urinary Tract: Stable 2.2 cm right and 1.3 cm left adrenal adenomas. No renal stones. No hydronephrosis. No contour deforming renal mass. Normal bladder. Stomach/Bowel: Grossly normal stomach. Stable 1.0 cm intraluminal lipoma at the junction of the second/ third  portions of the duodenum. Normal caliber small bowel with no small bowel wall thickening. Appendectomy . Normal large bowel with no diverticulosis, large bowel wall thickening or pericolonic fat stranding. Vascular/Lymphatic: Atherosclerotic nonaneurysmal abdominal aorta. No pathologically enlarged lymph nodes in the abdomen or pelvis. Reproductive: Status post hysterectomy, with no abnormal findings at the vaginal cuff. No adnexal mass. Other: No pneumoperitoneum, ascites or focal fluid collection. Musculoskeletal: No aggressive appearing focal osseous lesions. Moderate thoracolumbar spondylosis. IMPRESSION: 1. No acute abnormality. No evidence of bowel obstruction or acute bowel inflammation. No hydronephrosis . 2. Chronic findings include aortic atherosclerosis, diffuse hepatic steatosis and stable bilateral adrenal adenomas. Electronically Signed   By: Ilona Sorrel M.D.   On: 04/15/2017 16:31    Procedures Procedures (including critical care time)  Medications Ordered in ED Medications  sodium chloride 0.9 % bolus 1,000 mL (0 mLs Intravenous Stopped 04/15/17 1827)  ondansetron (ZOFRAN) injection 4 mg (4 mg Intravenous Given 04/15/17 1543)  ketorolac (TORADOL) 30 MG/ML injection 15 mg (15 mg Intravenous Given 04/15/17 1543)  HYDROmorphone (DILAUDID) injection 1 mg (1 mg Intravenous Given 04/15/17 1543)     Initial Impression / Assessment and Plan / ED Course  I have reviewed the triage vital signs and the nursing notes.  Pertinent labs & imaging results that were available during my care of the patient were reviewed by me and considered in my medical decision making (see chart for details).     55yF with L flank pain of unclear etiology, but does not appear to be emergent process. Nontoxic.   Final Clinical Impressions(s) / ED Diagnoses   Final diagnoses:  Left flank pain    New Prescriptions Discharge Medication List as of 04/15/2017  6:46 PM       Virgel Manifold, MD 04/27/17  [phone]

## 2017-06-05 ENCOUNTER — Telehealth: Payer: Self-pay

## 2017-06-05 NOTE — Telephone Encounter (Signed)
Pt left Vm that she is ready to schedule colonoscopy.  Call her at [phone].

## 2017-06-07 NOTE — Telephone Encounter (Signed)
LMOM to call.

## 2017-06-08 ENCOUNTER — Telehealth: Payer: Self-pay

## 2017-06-08 NOTE — Telephone Encounter (Signed)
Gastroenterology Pre-Procedure Review  Request Date: Requesting Physician: Tobi Bastos  FIRST TCS  PATIENT REVIEW QUESTIONS: The patient responded to the following health history questions as indicated:    1. Diabetes Melitis: YES 2. Joint replacements in the past 12 months: NO 3. Major health problems in the past 3 months: NO 4. Has an artificial valve or MVP: NO 5. Has a defibrillator: NO 6. Has been advised in past to take antibiotics in advance of a procedure like teeth cleaning: NO 7. Family history of colon cancer: BROTHER PASSED WITH COLON CANCER AND FATHER 8. Alcohol Use: NO 9. History of sleep apnea: NO  10. History of coronary artery or other vascular stents placed within the last 12 months: NO 11. History of any prior anesthesia complications: NO    MEDICATIONS & ALLERGIES:    Patient reports the following regarding taking any blood thinners:   Plavix? NO Aspirin? YES Coumadin? NO Brilinta? NO Xarelto? NO Eliquis? NO Pradaxa? NO Savaysa? NO Effient? NO  Patient confirms/reports the following medications:  Current Outpatient Prescriptions  Medication Sig Dispense Refill  . acetaminophen (TYLENOL) 500 MG tablet Take 1,500 mg by mouth every 6 (six) hours as needed for mild pain or moderate pain.     Marland Kitchen ALPRAZolam (XANAX) 0.25 MG tablet Take 0.25 mg by mouth 2 (two) times daily.    Marland Kitchen aspirin EC 81 MG tablet Take 81 mg by mouth at bedtime.    Marland Kitchen atorvastatin (LIPITOR) 10 MG tablet Take 10 mg by mouth daily.    . citalopram (CELEXA) 40 MG tablet Take 40 mg by mouth daily.    Marland Kitchen gabapentin (NEURONTIN) 100 MG capsule Take 100 mg by mouth 3 (three) times daily.    . hydrOXYzine (ATARAX/VISTARIL) 25 MG tablet Take 50 mg by mouth 2 (two) times daily.     . Hyprom-Naphaz-Polysorb-Zn Sulf (CLEAR EYES COMPLETE OP) Apply 1-2 drops to eye 2 (two) times daily as needed.    Marland Kitchen lisinopril (PRINIVIL,ZESTRIL) 10 MG tablet Take 10 mg by mouth daily.    . meloxicam (MOBIC) 15 MG  tablet Take 15 mg by mouth daily.    . metFORMIN (GLUCOPHAGE) 500 MG tablet Take 1,000 mg by mouth 2 (two) times daily with a meal.     . Topiramate (TOPAMAX PO) Take 50 mg by mouth 2 (two) times daily.     No current facility-administered medications for this visit.     Patient confirms/reports the following allergies:  Allergies  Allergen Reactions  . Sinequan [Doxepin Hcl] Anaphylaxis  . Methylphenidate Derivatives Hives  . Tranxene [Clorazepate Dipotassium] Hives and Swelling    No orders of the defined types were placed in this encounter.   AUTHORIZATION INFORMATION Primary Insurance: MEDICARE,  ID #:[id number] ,  Group #:  Pre-Cert / Auth required:  Pre-Cert / Auth #:   Secondary Insurance: MEDICAID ,  ID #:[id number] ,  Group #:  Pre-Cert / Auth required:  Pre-Cert / Auth #:   SCHEDULE INFORMATION: Procedure has been scheduled as follows:  Date: , Time:   Location:   This Gastroenterology Pre-Precedure Review Form is being routed to the following provider(s):

## 2017-06-08 NOTE — Telephone Encounter (Signed)
Needs ov based on meds. H/o anxiety/depression. Would recommend deep sedation.

## 2017-06-08 NOTE — Telephone Encounter (Signed)
Please call patient at [phone]. She received a letter from DS and isn't having any problems, no blood thinners or hx of heart attacks.

## 2017-06-12 NOTE — Telephone Encounter (Signed)
Office visit set up for 08/02/17 @ 2:00 pm with EG

## 2017-06-13 NOTE — Telephone Encounter (Signed)
Pt has OV 08/02/2017.

## 2017-08-02 ENCOUNTER — Encounter: Payer: Self-pay | Admitting: Nurse Practitioner

## 2017-08-02 ENCOUNTER — Ambulatory Visit (INDEPENDENT_AMBULATORY_CARE_PROVIDER_SITE_OTHER): Payer: Medicare Other | Admitting: Nurse Practitioner

## 2017-08-02 DIAGNOSIS — K589 Irritable bowel syndrome without diarrhea: Secondary | ICD-10-CM | POA: Insufficient documentation

## 2017-08-02 DIAGNOSIS — K582 Mixed irritable bowel syndrome: Secondary | ICD-10-CM | POA: Diagnosis not present

## 2017-08-02 DIAGNOSIS — Z800 Family history of malignant neoplasm of digestive organs: Secondary | ICD-10-CM

## 2017-08-02 DIAGNOSIS — R69 Illness, unspecified: Secondary | ICD-10-CM | POA: Diagnosis not present

## 2017-08-02 MED ORDER — DICYCLOMINE HCL 10 MG PO CAPS: 10.0000 mg | ORAL_CAPSULE | Freq: Four times a day (QID) | ORAL | 2 refills | Status: DC | PRN

## 2017-08-02 NOTE — Assessment & Plan Note (Signed)
The patient has a significant family history of colon cancer. Her father was diagnosed about mid 79s and passed away from colon cancer. Her brother was diagnosed in his mid 15s and passed away from colon cancer. She is never had a colonoscopy before and is 56 years old. She is currently due. Due to polypharmacy she was brought into the office for augmented sedation. See further notes above. Return for follow-up based on postprocedure recommendations.  Proceed with colonoscopy on propofol/MAC with Dr. Oneida Alar in the near future. The risks, benefits, and alternatives have been discussed in detail with the patient. They state understanding and desire to proceed.   The patient has chronic anxiety and depression. She is currently on Xanax, Celexa, Neurontin. No other anticoagulants, anxiolytics, chronic pain medications, or antidepressants. We will plan for the procedure on propofol/MAC to promote adequate sedation.

## 2017-08-02 NOTE — Progress Notes (Signed)
cc'ed to pcp °

## 2017-08-02 NOTE — Progress Notes (Addendum)
REVIEWED-NO ADDITIONAL RECOMMENDATIONS.  Primary Care Physician:  Jani Gravel, MD Primary Gastroenterologist:  Dr. Oneida Alar  Chief Complaint  Patient presents with  . Abdominal Pain    more to left side  . Colonoscopy    first ever, family hx CRC    HPI:   Katelyn Kelly is a 56 y.o. female who presents to schedule colonoscopy. Scheduling was deferred to office visit due to polypharmacy and likely need for deep sedation in the operating room. The patient is 56 years old he received a letter indicating time to schedule colonoscopy. No history of colonoscopy found in our system.  Today she states she's doing ok overall. Has never had a colonoscopy. Admits abdominal pain mid-abdomen and was told by PCP she thinks it's a hernia but isn't sure. Has persistent nausea but no vomiting. Denies hematocezia, melena, unintentional weight loss, acute changed in bowel habits, fever, chills. Has variable bowel movements from constipated to diarrhea. Abdominal pain tends to improve with a bowel movement. Occasional normal stools as well. Out of a theoretical 10 bowel movements she would except 3-4 to be constipated, 2 to be loose, and 4-5 to be normal. Symptoms last in cycles of a few days each. Denies chest pain, dyspnea, dizziness, lightheadedness, syncope, near syncope. Denies any other upper or lower GI symptoms.  Past Medical History:  Diagnosis Date  . Anxiety and depression   . Bilateral carpal tunnel syndrome   . Chronic back pain   . Chronic neck pain   . Diabetes mellitus   . H/O syncope   . Hypertension   . Manic depression ([place])   . Panic attack     Past Surgical History:  Procedure Laterality Date  . ABDOMINAL HYSTERECTOMY    . ABDOMINAL SURGERY    . APPENDECTOMY    . CARPAL TUNNEL RELEASE    . CHOLECYSTECTOMY    . TUBAL LIGATION      Current Outpatient Prescriptions  Medication Sig Dispense Refill  . acetaminophen (TYLENOL) 500 MG tablet Take 1,500 mg by mouth every 6  (six) hours as needed for mild pain or moderate pain.     Marland Kitchen ALPRAZolam (XANAX) 0.25 MG tablet Take 0.25 mg by mouth 2 (two) times daily.    Marland Kitchen atorvastatin (LIPITOR) 10 MG tablet Take 10 mg by mouth daily.    . citalopram (CELEXA) 40 MG tablet Take 40 mg by mouth daily.    Marland Kitchen gabapentin (NEURONTIN) 100 MG capsule Take 100 mg by mouth 3 (three) times daily.    . hydrOXYzine (ATARAX/VISTARIL) 25 MG tablet Take 50 mg by mouth 2 (two) times daily.     . Hyprom-Naphaz-Polysorb-Zn Sulf (CLEAR EYES COMPLETE OP) Apply 1-2 drops to eye 2 (two) times daily as needed.    Marland Kitchen lisinopril (PRINIVIL,ZESTRIL) 10 MG tablet Take 10 mg by mouth daily.    . meloxicam (MOBIC) 15 MG tablet Take 15 mg by mouth daily.    . metFORMIN (GLUCOPHAGE) 500 MG tablet Take 1,000 mg by mouth 2 (two) times daily with a meal.     . Topiramate (TOPAMAX PO) Take 50 mg by mouth 2 (two) times daily. Takes 25 mg tablets    2 in the Am and 3 in the pm     No current facility-administered medications for this visit.     Allergies as of 08/02/2017 - Review Complete 08/02/2017  Allergen Reaction Noted  . Sinequan [doxepin hcl] Anaphylaxis 01/10/2012  . Methylphenidate derivatives Hives 07/28/2013  . Tranxene [clorazepate  dipotassium] Hives and Swelling 01/10/2012    Family History  Problem Relation Age of Onset  . Diabetes Mother   . Stroke Mother   . Colon cancer Father 82       Passed away from colon cancer  . Colon cancer Brother 15       Passed away from colon cancer    Social History   Social History  . Marital status: Legally Separated    Spouse name: N/A  . Number of children: N/A  . Years of education: N/A   Occupational History  . Not on file.   Social History Main Topics  . Smoking status: Current Every Day Smoker    Packs/day: 1.50    Years: 40.00    Types: Cigarettes  . Smokeless tobacco: Never Used     Comment: one pack a day  . Alcohol use No  . Drug use: No  . Sexual activity: Not on file   Other  Topics Concern  . Not on file   Social History Narrative  . No narrative on file    Review of Systems: Complete ROS negative except as per HPI.   Physical Exam: BP 98/60   Pulse 68   Temp 98.4 F (36.9 C) (Oral)   Ht 5' 1.5" (1.562 m)   Wt 180 lb 6.4 oz (81.8 kg)   BMI 33.53 kg/m  General:   Alert and oriented. Pleasant and cooperative. Well-nourished and well-developed.  Head:  Normocephalic and atraumatic. Eyes:  Without icterus, sclera clear and conjunctiva pink.  Ears:  Normal auditory acuity. Cardiovascular:  S1, S2 present without murmurs appreciated. Extremities without clubbing or edema. Respiratory:  Clear to auscultation bilaterally. No wheezes, rales, or rhonchi. No distress.  Gastrointestinal:  +BS, soft, and non-distended. Mid- to upper abdominal TTP noted. No HSM noted. No guarding or rebound. Medium sized ventral hernia noted soft and non-tender.  Rectal:  Deferred  Musculoskalatal:  Symmetrical without gross deformities. Skin:  Intact without significant lesions or rashes. Neurologic:  Alert and oriented x4;  grossly normal neurologically. Psych:  Alert and cooperative. Normal mood and affect. Heme/Lymph/Immune: No excessive bruising noted.    08/02/2017 2:23 PM   Disclaimer: This note was dictated with voice recognition software. Similar sounding words can inadvertently be transcribed and may not be corrected upon review.

## 2017-08-02 NOTE — Patient Instructions (Signed)
1. We will schedule your colonoscopy for you. 2. When you're constipated you can use MiraLAX powder 17 g (1 capful) mixed into a beverage of your choice. He can do this one to 2 times a day as needed for hard stools straining and constipation. 3. If you're having diarrhea I have sent an Bentyl 10 mg she can take up to 4 times a day, at meals and at bedtime, as needed for diarrhea. 4. Further recommendations will be made after your colonoscopy. 5. Return for follow-up in 3 months. 6. Call us if you have any questions or concerns.

## 2017-08-02 NOTE — Assessment & Plan Note (Addendum)
The patient is on multiple medications for pain and anxiety. This will necessitate augmented sedation including propofol/MAC for her procedure. Continue current medications as prescribed. Return for follow-up based on postprocedure recommendations.Marland Kitchen

## 2017-08-02 NOTE — Assessment & Plan Note (Signed)
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 She tends to have about 40% constipation, 20% diarrhea, 40% normal stools. Abdominal pain associated with stool changes and relieved after a bowel movement. This point her abdominal pain could also be coming from her hernia, though this seems less likely. I will start her on first-line medications for diarrhea and constipation with instructions on when to take him. MiraLAX 17 g 1-2 times a day as needed for constipation, Bentyl 10 mg up to 4 times a day as needed for diarrhea. Return for follow-up in 3 months. If she continues to have abdominal pain despite improvement in bowel movements we can consider further diagnostics including CT imaging to further evaluate her hernia.

## 2017-08-03 ENCOUNTER — Telehealth: Payer: Self-pay

## 2017-08-03 NOTE — Telephone Encounter (Signed)
Tried to call pt to schedule TCS w/propofol with [facility], a female answered phone and pt wasn't available. Asked him to have her call the office.

## 2017-08-04 ENCOUNTER — Telehealth: Payer: Self-pay | Admitting: Gastroenterology

## 2017-08-04 NOTE — Telephone Encounter (Signed)
Pt was returning a call from yesterday for MB. Regarding scheduling her colonoscopy. Please call her back at [phone]

## 2017-08-07 ENCOUNTER — Other Ambulatory Visit: Payer: Self-pay

## 2017-08-07 DIAGNOSIS — Z1211 Encounter for screening for malignant neoplasm of colon: Secondary | ICD-10-CM

## 2017-08-07 MED ORDER — NA SULFATE-K SULFATE-MG SULF 17.5-3.13-1.6 GM/177ML PO SOLN: 1.0000 | ORAL | 0 refills | Status: DC

## 2017-08-07 NOTE — Telephone Encounter (Signed)
Called pt. Colonoscopy with Propofol with [facility] scheduled for 09/05/17 at 10:45am. Rx for prep sent to pharmacy. Will mail instructions after pre-op appt is scheduled. Orders entered.  EG gave verbal for pt to take 1/2 dose of Metformin the night before Colonoscopy and none the morning of procedure (noted on instructions).

## 2017-08-07 NOTE — Telephone Encounter (Signed)
Called and informed pt of pre-op appt 08/29/17 at 9:00am. Letter mailed.

## 2017-08-07 NOTE — Telephone Encounter (Signed)
See other phone note

## 2017-08-28 NOTE — Patient Instructions (Signed)
ALIX LAHMANN  08/28/2017     @PREFPERIOPPHARMACY @   Your procedure is scheduled on  09/05/2017   Report to [facility] at  915   A.M.  Call this number if you have problems the morning of surgery:  [phone]   Remember:  Do not eat food or drink liquids after midnight.  Take these medicines the morning of surgery with A SIP OF WATER  Xanax, celexa, gabapentin, lisinopril, topamax. DO NOT take any medication for diabetes the morning of your procedure.   Do not wear jewelry, make-up or nail polish.  Do not wear lotions, powders, or perfumes, or deoderant.  Do not shave 48 hours prior to surgery.  Men may shave face and neck.  Do not bring valuables to the hospital.  [facility] is not responsible for any belongings or valuables.  Contacts, dentures or bridgework may not be worn into surgery.  Leave your suitcase in the car.  After surgery it may be brought to your room.  For patients admitted to the hospital, discharge time will be determined by your treatment team.  Patients discharged the day of surgery will not be allowed to drive home.   Name and phone number of your driver:   family Special instructions:  Follow the diet and prep instructions given to you by Dr Nona Dell office.  Please read over the following fact sheets that you were given. Anesthesia Post-op Instructions and Care and Recovery After Surgery       Colonoscopy, Adult A colonoscopy is an exam to look at the entire large intestine. During the exam, a lubricated, bendable tube is inserted into the anus and then passed into the rectum, colon, and other parts of the large intestine. A colonoscopy is often done as a part of normal colorectal screening or in response to certain symptoms, such as anemia, persistent diarrhea, abdominal pain, and blood in the stool. The exam can help screen for and diagnose medical problems, including:  Tumors.  Polyps.  Inflammation.  Areas of  bleeding.  Tell a health care provider about:  Any allergies you have.  All medicines you are taking, including vitamins, herbs, eye drops, creams, and over-the-counter medicines.  Any problems you or family members have had with anesthetic medicines.  Any blood disorders you have.  Any surgeries you have had.  Any medical conditions you have.  Any problems you have had passing stool. What are the risks? Generally, this is a safe procedure. However, problems may occur, including:  Bleeding.  A tear in the intestine.  A reaction to medicines given during the exam.  Infection (rare).  What happens before the procedure? Eating and drinking restrictions Follow instructions from your health care provider about eating and drinking, which may include:  A few days before the procedure - follow a low-fiber diet. Avoid nuts, seeds, dried fruit, raw fruits, and vegetables.  1-3 days before the procedure - follow a clear liquid diet. Drink only clear liquids, such as clear broth or bouillon, black coffee or tea, clear juice, clear soft drinks or sports drinks, gelatin dessert, and popsicles. Avoid any liquids that contain red or purple dye.  On the day of the procedure - do not eat or drink anything during the 2 hours before the procedure, or within the time period that your health care provider recommends.  Bowel prep If you were prescribed an oral bowel prep to clean out your colon:  Take it as told by your health care provider. Starting the day before your procedure, you will need to drink a large amount of medicated liquid. The liquid will cause you to have multiple loose stools until your stool is almost clear or light green.  If your skin or anus gets irritated from diarrhea, you may use these to relieve the irritation: ? Medicated wipes, such as adult wet wipes with aloe and vitamin E. ? A skin soothing-product like petroleum jelly.  If you vomit while drinking the bowel  prep, take a break for up to 60 minutes and then begin the bowel prep again. If vomiting continues and you cannot take the bowel prep without vomiting, call your health care provider.  General instructions  Ask your health care provider about changing or stopping your regular medicines. This is especially important if you are taking diabetes medicines or blood thinners.  Plan to have someone take you home from the hospital or clinic. What happens during the procedure?  An IV tube may be inserted into one of your veins.  You will be given medicine to help you relax (sedative).  To reduce your risk of infection: ? Your health care team will wash or sanitize their hands. ? Your anal area will be washed with soap.  You will be asked to lie on your side with your knees bent.  Your health care provider will lubricate a long, thin, flexible tube. The tube will have a camera and a light on the end.  The tube will be inserted into your anus.  The tube will be gently eased through your rectum and colon.  Air will be delivered into your colon to keep it open. You may feel some pressure or cramping.  The camera will be used to take images during the procedure.  A small tissue sample may be removed from your body to be examined under a microscope (biopsy). If any potential problems are found, the tissue will be sent to a lab for testing.  If small polyps are found, your health care provider may remove them and have them checked for cancer cells.  The tube that was inserted into your anus will be slowly removed. The procedure may vary among health care providers and hospitals. What happens after the procedure?  Your blood pressure, heart rate, breathing rate, and blood oxygen level will be monitored until the medicines you were given have worn off.  Do not drive for 24 hours after the exam.  You may have a small amount of blood in your stool.  You may pass gas and have mild abdominal  cramping or bloating due to the air that was used to inflate your colon during the exam.  It is up to you to get the results of your procedure. Ask your health care provider, or the department performing the procedure, when your results will be ready. This information is not intended to replace advice given to you by your health care provider. Make sure you discuss any questions you have with your health care provider. Document Released: 10/14/2000 Document Revised: 08/17/2016 Document Reviewed: 12/29/2015 Elsevier Interactive Patient Education  2018 [employer].  Colonoscopy, Adult, Care After This sheet gives you information about how to care for yourself after your procedure. Your health care provider may also give you more specific instructions. If you have problems or questions, contact your health care provider. What can I expect after the procedure? After the procedure, it is common to have:  A small amount of blood in your stool for 24 hours after the procedure.  Some gas.  Mild abdominal cramping or bloating.  Follow these instructions at home: General instructions   For the first 24 hours after the procedure: ? Do not drive or use machinery. ? Do not sign important documents. ? Do not drink alcohol. ? Do your regular daily activities at a slower pace than normal. ? Eat soft, easy-to-digest foods. ? Rest often.  Take over-the-counter or prescription medicines only as told by your health care provider.  It is up to you to get the results of your procedure. Ask your health care provider, or the department performing the procedure, when your results will be ready. Relieving cramping and bloating  Try walking around when you have cramps or feel bloated.  Apply heat to your abdomen as told by your health care provider. Use a heat source that your health care provider recommends, such as a moist heat pack or a heating pad. ? Place a towel between your skin and the heat  source. ? Leave the heat on for 20-30 minutes. ? Remove the heat if your skin turns bright red. This is especially important if you are unable to feel pain, heat, or cold. You may have a greater risk of getting burned. Eating and drinking  Drink enough fluid to keep your urine clear or pale yellow.  Resume your normal diet as instructed by your health care provider. Avoid heavy or fried foods that are hard to digest.  Avoid drinking alcohol for as long as instructed by your health care provider. Contact a health care provider if:  You have blood in your stool 2-3 days after the procedure. Get help right away if:  You have more than a small spotting of blood in your stool.  You pass large blood clots in your stool.  Your abdomen is swollen.  You have nausea or vomiting.  You have a fever.  You have increasing abdominal pain that is not relieved with medicine. This information is not intended to replace advice given to you by your health care provider. Make sure you discuss any questions you have with your health care provider. Document Released: 05/31/2004 Document Revised: 07/11/2016 Document Reviewed: 12/29/2015 Elsevier Interactive Patient Education  2018 [place] Anesthesia is a term that refers to techniques, procedures, and medicines that help a person stay safe and comfortable during a medical procedure. Monitored anesthesia care, or sedation, is one type of anesthesia. Your anesthesia specialist may recommend sedation if you will be having a procedure that does not require you to be unconscious, such as:  Cataract surgery.  A dental procedure.  A biopsy.  A colonoscopy.  During the procedure, you may receive a medicine to help you relax (sedative). There are three levels of sedation:  Mild sedation. At this level, you may feel awake and relaxed. You will be able to follow directions.  Moderate sedation. At this level, you will be  sleepy. You may not remember the procedure.  Deep sedation. At this level, you will be asleep. You will not remember the procedure.  The more medicine you are given, the deeper your level of sedation will be. Depending on how you respond to the procedure, the anesthesia specialist may change your level of sedation or the type of anesthesia to fit your needs. An anesthesia specialist will monitor you closely during the procedure. Let your health care provider know about:  Any allergies  you have.  All medicines you are taking, including vitamins, herbs, eye drops, creams, and over-the-counter medicines.  Any use of steroids (by mouth or as a cream).  Any problems you or family members have had with sedatives and anesthetic medicines.  Any blood disorders you have.  Any surgeries you have had.  Any medical conditions you have, such as sleep apnea.  Whether you are pregnant or may be pregnant.  Any use of cigarettes, alcohol, or street drugs. What are the risks? Generally, this is a safe procedure. However, problems may occur, including:  Getting too much medicine (oversedation).  Nausea.  Allergic reaction to medicines.  Trouble breathing. If this happens, a breathing tube may be used to help with breathing. It will be removed when you are awake and breathing on your own.  Heart trouble.  Lung trouble.  Before the procedure Staying hydrated Follow instructions from your health care provider about hydration, which may include:  Up to 2 hours before the procedure - you may continue to drink clear liquids, such as water, clear fruit juice, black coffee, and plain tea.  Eating and drinking restrictions Follow instructions from your health care provider about eating and drinking, which may include:  8 hours before the procedure - stop eating heavy meals or foods such as meat, fried foods, or fatty foods.  6 hours before the procedure - stop eating light meals or foods, such  as toast or cereal.  6 hours before the procedure - stop drinking milk or drinks that contain milk.  2 hours before the procedure - stop drinking clear liquids.  Medicines Ask your health care provider about:  Changing or stopping your regular medicines. This is especially important if you are taking diabetes medicines or blood thinners.  Taking medicines such as aspirin and ibuprofen. These medicines can thin your blood. Do not take these medicines before your procedure if your health care provider instructs you not to.  Tests and exams  You will have a physical exam.  You may have blood tests done to show: ? How well your kidneys and liver are working. ? How well your blood can clot.  General instructions  Plan to have someone take you home from the hospital or clinic.  If you will be going home right after the procedure, plan to have someone with you for 24 hours.  What happens during the procedure?  Your blood pressure, heart rate, breathing, level of pain and overall condition will be monitored.  An IV tube will be inserted into one of your veins.  Your anesthesia specialist will give you medicines as needed to keep you comfortable during the procedure. This may mean changing the level of sedation.  The procedure will be performed. After the procedure  Your blood pressure, heart rate, breathing rate, and blood oxygen level will be monitored until the medicines you were given have worn off.  Do not drive for 24 hours if you received a sedative.  You may: ? Feel sleepy, clumsy, or nauseous. ? Feel forgetful about what happened after the procedure. ? Have a sore throat if you had a breathing tube during the procedure. ? Vomit. This information is not intended to replace advice given to you by your health care provider. Make sure you discuss any questions you have with your health care provider. Document Released: 07/13/2005 Document Revised: 03/25/2016 Document  Reviewed: 02/07/2016 Elsevier Interactive Patient Education  2018 [place], Care After These instructions provide you  with information about caring for yourself after your procedure. Your health care provider may also give you more specific instructions. Your treatment has been planned according to current medical practices, but problems sometimes occur. Call your health care provider if you have any problems or questions after your procedure. What can I expect after the procedure? After your procedure, it is common to:  Feel sleepy for several hours.  Feel clumsy and have poor balance for several hours.  Feel forgetful about what happened after the procedure.  Have poor judgment for several hours.  Feel nauseous or vomit.  Have a sore throat if you had a breathing tube during the procedure.  Follow these instructions at home: For at least 24 hours after the procedure:   Do not: ? Participate in activities in which you could fall or become injured. ? Drive. ? Use heavy machinery. ? Drink alcohol. ? Take sleeping pills or medicines that cause drowsiness. ? Make important decisions or sign legal documents. ? Take care of children on your own.  Rest. Eating and drinking  Follow the diet that is recommended by your health care provider.  If you vomit, drink water, juice, or soup when you can drink without vomiting.  Make sure you have little or no nausea before eating solid foods. General instructions  Have a responsible adult stay with you until you are awake and alert.  Take over-the-counter and prescription medicines only as told by your health care provider.  If you smoke, do not smoke without supervision.  Keep all follow-up visits as told by your health care provider. This is important. Contact a health care provider if:  You keep feeling nauseous or you keep vomiting.  You feel light-headed.  You develop a rash.  You have a  fever. Get help right away if:  You have trouble breathing. This information is not intended to replace advice given to you by your health care provider. Make sure you discuss any questions you have with your health care provider. Document Released: 02/07/2016 Document Revised: 06/08/2016 Document Reviewed: 02/07/2016 Elsevier Interactive Patient Education  [employer].

## 2017-08-29 ENCOUNTER — Other Ambulatory Visit: Payer: Self-pay

## 2017-08-29 ENCOUNTER — Encounter (HOSPITAL_COMMUNITY): Payer: Self-pay

## 2017-08-29 ENCOUNTER — Encounter (HOSPITAL_COMMUNITY)
Admission: RE | Admit: 2017-08-29 | Discharge: 2017-08-29 | Disposition: A | Discharge status: Home / Self Care | Payer: Medicare Other | Source: Ambulatory Visit | Attending: Gastroenterology | Admitting: Gastroenterology

## 2017-08-29 DIAGNOSIS — E119 Type 2 diabetes mellitus without complications: Secondary | ICD-10-CM | POA: Insufficient documentation

## 2017-08-29 DIAGNOSIS — Z888 Allergy status to other drugs, medicaments and biological substances status: Secondary | ICD-10-CM | POA: Diagnosis not present

## 2017-08-29 DIAGNOSIS — Z833 Family history of diabetes mellitus: Secondary | ICD-10-CM | POA: Diagnosis not present

## 2017-08-29 DIAGNOSIS — F17210 Nicotine dependence, cigarettes, uncomplicated: Secondary | ICD-10-CM | POA: Insufficient documentation

## 2017-08-29 DIAGNOSIS — Z7984 Long term (current) use of oral hypoglycemic drugs: Secondary | ICD-10-CM | POA: Insufficient documentation

## 2017-08-29 DIAGNOSIS — F419 Anxiety disorder, unspecified: Secondary | ICD-10-CM | POA: Diagnosis not present

## 2017-08-29 DIAGNOSIS — I10 Essential (primary) hypertension: Secondary | ICD-10-CM | POA: Insufficient documentation

## 2017-08-29 DIAGNOSIS — R109 Unspecified abdominal pain: Secondary | ICD-10-CM | POA: Diagnosis not present

## 2017-08-29 DIAGNOSIS — Z823 Family history of stroke: Secondary | ICD-10-CM | POA: Insufficient documentation

## 2017-08-29 DIAGNOSIS — Z90710 Acquired absence of both cervix and uterus: Secondary | ICD-10-CM | POA: Diagnosis not present

## 2017-08-29 DIAGNOSIS — Z9049 Acquired absence of other specified parts of digestive tract: Secondary | ICD-10-CM | POA: Diagnosis not present

## 2017-08-29 DIAGNOSIS — Z800 Family history of malignant neoplasm of digestive organs: Secondary | ICD-10-CM | POA: Insufficient documentation

## 2017-08-29 DIAGNOSIS — Z9889 Other specified postprocedural states: Secondary | ICD-10-CM | POA: Diagnosis not present

## 2017-08-29 DIAGNOSIS — Z79899 Other long term (current) drug therapy: Secondary | ICD-10-CM | POA: Diagnosis not present

## 2017-08-29 DIAGNOSIS — Z01812 Encounter for preprocedural laboratory examination: Secondary | ICD-10-CM | POA: Diagnosis not present

## 2017-08-29 DIAGNOSIS — F329 Major depressive disorder, single episode, unspecified: Secondary | ICD-10-CM | POA: Insufficient documentation

## 2017-08-29 HISTORY — DX: Headache, unspecified: R51.9

## 2017-08-29 HISTORY — DX: Major depressive disorder, single episode, unspecified: F32.9

## 2017-08-29 HISTORY — DX: Headache: R51

## 2017-08-29 HISTORY — DX: Depression, unspecified: F32.A

## 2017-08-29 LAB — CBC WITH DIFFERENTIAL/PLATELET
BASOS ABS: 0.1 10*3/uL (ref 0.0–0.1)
Basophils Relative: 1 %
EOS PCT: 2 %
Eosinophils Absolute: 0.3 10*3/uL (ref 0.0–0.7)
HCT: 42.7 % (ref 36.0–46.0)
HEMOGLOBIN: 14.2 g/dL (ref 12.0–15.0)
LYMPHS ABS: 3.1 10*3/uL (ref 0.7–4.0)
LYMPHS PCT: 29 %
MCH: 31.3 pg (ref 26.0–34.0)
MCHC: 33.3 g/dL (ref 30.0–36.0)
MCV: 94.1 fL (ref 78.0–100.0)
Monocytes Absolute: 0.9 10*3/uL (ref 0.1–1.0)
Monocytes Relative: 8 %
NEUTROS PCT: 60 %
Neutro Abs: 6.5 10*3/uL (ref 1.7–7.7)
PLATELETS: 244 10*3/uL (ref 150–400)
RBC: 4.54 MIL/uL (ref 3.87–5.11)
RDW: 13.1 % (ref 11.5–15.5)
WBC: 10.9 10*3/uL — AB (ref 4.0–10.5)

## 2017-08-29 LAB — BASIC METABOLIC PANEL
ANION GAP: 8 (ref 5–15)
BUN: 14 mg/dL (ref 6–20)
CALCIUM: 8.6 mg/dL — AB (ref 8.9–10.3)
CO2: 24 mmol/L (ref 22–32)
Chloride: 102 mmol/L (ref 101–111)
Creatinine, Ser: 0.93 mg/dL (ref 0.44–1.00)
GFR calc Af Amer: >60 mL/min (ref >60)
GFR calc non Af Amer: >60 mL/min (ref >60)
GLUCOSE: 255 mg/dL — AB (ref 65–99)
Potassium: 3.9 mmol/L (ref 3.5–5.1)
Sodium: 134 mmol/L — ABNORMAL LOW (ref 135–145)

## 2017-08-29 LAB — HEMOGLOBIN A1C
Hgb A1c MFr Bld: 6.7 % — ABNORMAL HIGH (ref 4.8–5.6)
Mean Plasma Glucose: 145.59 mg/dL

## 2017-08-29 NOTE — Progress Notes (Signed)
LMOM to call.

## 2017-09-04 ENCOUNTER — Telehealth: Payer: Self-pay | Admitting: *Deleted

## 2017-09-04 ENCOUNTER — Other Ambulatory Visit: Payer: Self-pay | Admitting: *Deleted

## 2017-09-04 MED ORDER — NA SULFATE-K SULFATE-MG SULF 17.5-3.13-1.6 GM/177ML PO SOLN: 1.0000 | ORAL | 0 refills | Status: DC

## 2017-09-04 NOTE — Telephone Encounter (Signed)
Patient called in. She started her prep last night and her procedure is scheduled for 09/05/17.   Per Randall Hiss, send in new Rx for prep and she will need to start the prep per instructions at 6pm tonight and then do the 2nd bottle tomorrow Am 5:45am.  Patient is aware new Rx sent in and will do prep again tonight at 6pm and 2nd tomorrow at 5:45 am. Nothing further needed

## 2017-09-05 ENCOUNTER — Ambulatory Visit (HOSPITAL_COMMUNITY)
Admission: RE | Admit: 2017-09-05 | Discharge: 2017-09-05 | Disposition: A | Discharge status: Home / Self Care | Payer: Medicare Other | Source: Ambulatory Visit | Attending: Gastroenterology | Admitting: Gastroenterology

## 2017-09-05 ENCOUNTER — Encounter (HOSPITAL_COMMUNITY)
Admission: RE | Disposition: A | Discharge status: Home / Self Care | Payer: Self-pay | Source: Ambulatory Visit | Attending: Gastroenterology

## 2017-09-05 ENCOUNTER — Ambulatory Visit (HOSPITAL_COMMUNITY): Payer: Medicare Other | Admitting: Anesthesiology

## 2017-09-05 ENCOUNTER — Encounter (HOSPITAL_COMMUNITY): Payer: Self-pay | Admitting: Anesthesiology

## 2017-09-05 DIAGNOSIS — I10 Essential (primary) hypertension: Secondary | ICD-10-CM | POA: Diagnosis not present

## 2017-09-05 DIAGNOSIS — K644 Residual hemorrhoidal skin tags: Secondary | ICD-10-CM | POA: Insufficient documentation

## 2017-09-05 DIAGNOSIS — Z7984 Long term (current) use of oral hypoglycemic drugs: Secondary | ICD-10-CM | POA: Insufficient documentation

## 2017-09-05 DIAGNOSIS — K5730 Diverticulosis of large intestine without perforation or abscess without bleeding: Secondary | ICD-10-CM | POA: Insufficient documentation

## 2017-09-05 DIAGNOSIS — F410 Panic disorder [episodic paroxysmal anxiety] without agoraphobia: Secondary | ICD-10-CM | POA: Insufficient documentation

## 2017-09-05 DIAGNOSIS — K648 Other hemorrhoids: Secondary | ICD-10-CM | POA: Insufficient documentation

## 2017-09-05 DIAGNOSIS — F329 Major depressive disorder, single episode, unspecified: Secondary | ICD-10-CM | POA: Diagnosis not present

## 2017-09-05 DIAGNOSIS — Z1212 Encounter for screening for malignant neoplasm of rectum: Secondary | ICD-10-CM

## 2017-09-05 DIAGNOSIS — Q438 Other specified congenital malformations of intestine: Secondary | ICD-10-CM | POA: Insufficient documentation

## 2017-09-05 DIAGNOSIS — Z888 Allergy status to other drugs, medicaments and biological substances status: Secondary | ICD-10-CM | POA: Diagnosis not present

## 2017-09-05 DIAGNOSIS — K635 Polyp of colon: Secondary | ICD-10-CM | POA: Insufficient documentation

## 2017-09-05 DIAGNOSIS — K621 Rectal polyp: Secondary | ICD-10-CM | POA: Diagnosis not present

## 2017-09-05 DIAGNOSIS — E119 Type 2 diabetes mellitus without complications: Secondary | ICD-10-CM | POA: Insufficient documentation

## 2017-09-05 DIAGNOSIS — Z800 Family history of malignant neoplasm of digestive organs: Secondary | ICD-10-CM | POA: Diagnosis not present

## 2017-09-05 DIAGNOSIS — Z79899 Other long term (current) drug therapy: Secondary | ICD-10-CM | POA: Insufficient documentation

## 2017-09-05 DIAGNOSIS — Z7982 Long term (current) use of aspirin: Secondary | ICD-10-CM | POA: Insufficient documentation

## 2017-09-05 DIAGNOSIS — Z1211 Encounter for screening for malignant neoplasm of colon: Secondary | ICD-10-CM | POA: Diagnosis not present

## 2017-09-05 DIAGNOSIS — F17210 Nicotine dependence, cigarettes, uncomplicated: Secondary | ICD-10-CM | POA: Insufficient documentation

## 2017-09-05 DIAGNOSIS — Z90710 Acquired absence of both cervix and uterus: Secondary | ICD-10-CM | POA: Insufficient documentation

## 2017-09-05 HISTORY — PX: BIOPSY: SHX5522

## 2017-09-05 HISTORY — PX: POLYPECTOMY: SHX5525

## 2017-09-05 HISTORY — PX: COLONOSCOPY WITH PROPOFOL: SHX5780

## 2017-09-05 LAB — GLUCOSE, CAPILLARY
GLUCOSE-CAPILLARY: 136 mg/dL — AB (ref 65–99)
Glucose-Capillary: 136 mg/dL — ABNORMAL HIGH (ref 65–99)

## 2017-09-05 SURGERY — COLONOSCOPY WITH PROPOFOL
Anesthesia: Monitor Anesthesia Care

## 2017-09-05 MED ORDER — ONDANSETRON 4 MG PO TBDP
ORAL_TABLET | ORAL | Status: AC
  Filled 2017-09-05: qty 1

## 2017-09-05 MED ORDER — HYDROMORPHONE HCL 1 MG/ML IJ SOLN
INTRAMUSCULAR | Status: AC
  Filled 2017-09-05: qty 1

## 2017-09-05 MED ORDER — ONDANSETRON 4 MG PO TBDP
4.0000 mg | ORAL_TABLET | Freq: Once | ORAL | Status: AC
  Administered 2017-09-05: 4 mg via ORAL

## 2017-09-05 MED ORDER — IPRATROPIUM-ALBUTEROL 0.5-2.5 (3) MG/3ML IN SOLN
3.0000 mL | Freq: Once | RESPIRATORY_TRACT | Status: AC
  Administered 2017-09-05: 3 mL via RESPIRATORY_TRACT

## 2017-09-05 MED ORDER — IPRATROPIUM-ALBUTEROL 0.5-2.5 (3) MG/3ML IN SOLN
RESPIRATORY_TRACT | Status: AC
  Filled 2017-09-05: qty 3

## 2017-09-05 MED ORDER — PROPOFOL 10 MG/ML IV BOLUS
INTRAVENOUS | Status: AC
  Filled 2017-09-05: qty 40

## 2017-09-05 MED ORDER — PROPOFOL 10 MG/ML IV BOLUS
INTRAVENOUS | Status: DC | PRN
  Administered 2017-09-05: 10 mg via INTRAVENOUS

## 2017-09-05 MED ORDER — CHLORHEXIDINE GLUCONATE CLOTH 2 % EX PADS: 6.0000 | MEDICATED_PAD | Freq: Once | CUTANEOUS | Status: DC

## 2017-09-05 MED ORDER — HYDROMORPHONE HCL 1 MG/ML IJ SOLN
0.5000 mg | INTRAMUSCULAR | Status: DC | PRN
  Administered 2017-09-05 (×2): 0.5 mg via INTRAVENOUS

## 2017-09-05 MED ORDER — PROPOFOL 10 MG/ML IV BOLUS
INTRAVENOUS | Status: AC
  Filled 2017-09-05: qty 20

## 2017-09-05 MED ORDER — LACTATED RINGERS IV SOLN
INTRAVENOUS | Status: DC
  Administered 2017-09-05: 10:00:00 via INTRAVENOUS

## 2017-09-05 MED ORDER — PROPOFOL 500 MG/50ML IV EMUL
INTRAVENOUS | Status: DC | PRN
  Administered 2017-09-05: 150 ug/kg/min via INTRAVENOUS

## 2017-09-05 NOTE — Anesthesia Preprocedure Evaluation (Signed)
Anesthesia Evaluation    Airway Mallampati: I  TM Distance: >3 FB     Dental  (+) Poor Dentition   Pulmonary Current Smoker,   Bilateral upper airway.  "smoker's" lungs  + rhonchi        Cardiovascular Pt. on medications  Rhythm:Regular Rate:Normal     Neuro/Psych  Headaches, Very nervous, "jumpy" Neuromuscular disease    GI/Hepatic   Endo/Other  diabetes  Renal/GU      Musculoskeletal  (+) Arthritis ,   Abdominal   Peds  Hematology   Anesthesia Other Findings   Reproductive/Obstetrics                             Anesthesia Physical Anesthesia Plan  ASA: III  Anesthesia Plan: MAC   Post-op Pain Management:    Induction:   PONV Risk Score and Plan: 1 and Ondansetron and Midazolam  Airway Management Planned:   Additional Equipment:   Intra-op Plan:   Post-operative Plan:   Informed Consent: I have reviewed the patients History and Physical, chart, labs and discussed the procedure including the risks, benefits and alternatives for the proposed anesthesia with the patient or authorized representative who has indicated his/her understanding and acceptance.   Dental advisory given  Plan Discussed with: CRNA  Anesthesia Plan Comments:         Anesthesia Quick Evaluation

## 2017-09-05 NOTE — Transfer of Care (Signed)
Immediate Anesthesia Transfer of Care Note  Patient: Katelyn Kelly  Procedure(s) Performed: COLONOSCOPY WITH PROPOFOL (N/A ) BIOPSY POLYPECTOMY  Patient Location: PACU  Anesthesia Type:MAC  Level of Consciousness: awake and alert   Airway & Oxygen Therapy: Patient Spontanous Breathing  Post-op Assessment: Report given to RN  Post vital signs: Reviewed and stable  Last Vitals:  Vitals:   09/05/17 1100 09/05/17 1115  BP: 101/61 104/64  Resp: 13 17  Temp:    SpO2: 94% 94%    Last Pain:  Vitals:   09/05/17 0938  TempSrc: Oral  PainSc: 4       Patients Stated Pain Goal: 7 (82/80/03 4917)  Complications: No apparent anesthesia complications

## 2017-09-05 NOTE — Discharge Instructions (Signed)
You had 5 polyps removed. You have internal hemorrhoids.  TO REDUCE YOUR RISK FOR COLON CANCER: 1. STOP SMOKING AND 2. LOSE 20 LBS.  CONTINUE YOUR WEIGHT LOSS EFFORTS.  DRINK WATER TO KEEP YOUR URINE LIGHT YELLOW.  HIGH FIBER DIET. AVOID ITEMS THAT CAUSE BLOATING & GAS. SEE INFO BELOW.  YOUR BIOPSY RESULTS WILL BE AVAILABLE IN MY CHART AFTER NOV 13 AND MY OFFICE WILL CONTACT YOU IN 10-14 DAYS WITH YOUR RESULTS.   Next colonoscopy in 5 years.    Colonoscopy Care After Read the instructions outlined below and refer to this sheet in the next week. These discharge instructions provide you with general information on caring for yourself after you leave the hospital. While your treatment has been planned according to the most current medical practices available, unavoidable complications occasionally occur. If you have any problems or questions after discharge, call DR. Valaree Fresquez, [phone].  ACTIVITY  You may resume your regular activity, but move at a slower pace for the next 24 hours.   Take frequent rest periods for the next 24 hours.   Walking will help get rid of the air and reduce the bloated feeling in your belly (abdomen).   No driving for 24 hours (because of the medicine (anesthesia) used during the test).   You may shower.   Do not sign any important legal documents or operate any machinery for 24 hours (because of the anesthesia used during the test).    NUTRITION  Drink plenty of fluids.   You may resume your normal diet as instructed by your doctor.   Begin with a light meal and progress to your normal diet. Heavy or fried foods are harder to digest and may make you feel sick to your stomach (nauseated).   Avoid alcoholic beverages for 24 hours or as instructed.    MEDICATIONS  You may resume your normal medications.   WHAT YOU CAN EXPECT TODAY  Some feelings of bloating in the abdomen.   Passage of more gas than usual.   Spotting of blood in your  stool or on the toilet paper  .  IF YOU HAD POLYPS REMOVED DURING THE COLONOSCOPY:  Eat a soft diet IF YOU HAVE NAUSEA, BLOATING, ABDOMINAL PAIN, OR VOMITING.    FINDING OUT THE RESULTS OF YOUR TEST Not all test results are available during your visit. DR. Oneida Alar WILL CALL YOU WITHIN 14 DAYS OF YOUR PROCEDUE WITH YOUR RESULTS. Do not assume everything is normal if you have not heard from DR. Thaddaeus Granja, CALL HER OFFICE AT [phone].  SEEK IMMEDIATE MEDICAL ATTENTION AND CALL THE OFFICE: [phone] IF:  You have more than a spotting of blood in your stool.   Your belly is swollen (abdominal distention).   You are nauseated or vomiting.   You have a temperature over 101F.   You have abdominal pain or discomfort that is severe or gets worse throughout the day.   High-Fiber Diet A high-fiber diet changes your normal diet to include more whole grains, legumes, fruits, and vegetables. Changes in the diet involve replacing refined carbohydrates with unrefined foods. The calorie level of the diet is essentially unchanged. The Dietary Reference Intake (recommended amount) for adult males is 38 grams per day. For adult females, it is 25 grams per day. Pregnant and lactating women should consume 28 grams of fiber per day. Fiber is the intact part of a plant that is not broken down during digestion. Functional fiber is fiber that has been isolated from  the plant to provide a beneficial effect in the body. PURPOSE  Increase stool bulk.   Ease and regulate bowel movements.   Lower cholesterol.   REDUCE RISK OF COLON CANCER  INDICATIONS THAT YOU NEED MORE FIBER  Constipation and hemorrhoids.   Uncomplicated diverticulosis (intestine condition) and irritable bowel syndrome.   Weight management.   As a protective measure against hardening of the arteries (atherosclerosis), diabetes, and cancer.   GUIDELINES FOR INCREASING FIBER IN THE DIET  Start adding fiber to the diet slowly. A  gradual increase of about 5 more grams (2 slices of whole-wheat bread, 2 servings of most fruits or vegetables, or 1 bowl of high-fiber cereal) per day is best. Too rapid an increase in fiber may result in constipation, flatulence, and bloating.   Drink enough water and fluids to keep your urine clear or pale yellow. Water, juice, or caffeine-free drinks are recommended. Not drinking enough fluid may cause constipation.   Eat a variety of high-fiber foods rather than one type of fiber.   Try to increase your intake of fiber through using high-fiber foods rather than fiber pills or supplements that contain small amounts of fiber.   The goal is to change the types of food eaten. Do not supplement your present diet with high-fiber foods, but replace foods in your present diet.   INCLUDE A VARIETY OF FIBER SOURCES  Replace refined and processed grains with whole grains, canned fruits with fresh fruits, and incorporate other fiber sources. White rice, white breads, and most bakery goods contain little or no fiber.   Brown whole-grain rice, buckwheat oats, and many fruits and vegetables are all good sources of fiber. These include: broccoli, Brussels sprouts, cabbage, cauliflower, beets, sweet potatoes, white potatoes (skin on), carrots, tomatoes, eggplant, squash, berries, fresh fruits, and dried fruits.   Cereals appear to be the richest source of fiber. Cereal fiber is found in whole grains and bran. Bran is the fiber-rich outer coat of cereal grain, which is largely removed in refining. In whole-grain cereals, the bran remains. In breakfast cereals, the largest amount of fiber is found in those with "bran" in their names. The fiber content is sometimes indicated on the label.   You may need to include additional fruits and vegetables each day.   In baking, for 1 cup white flour, you may use the following substitutions:   1 cup whole-wheat flour minus 2 tablespoons.   1/2 cup white flour plus  1/2 cup whole-wheat flour.   Polyps, Colon  A polyp is extra tissue that grows inside your body. Colon polyps grow in the large intestine. The large intestine, also called the colon, is part of your digestive system. It is a long, hollow tube at the end of your digestive tract where your body makes and stores stool. Most polyps are not dangerous. They are benign. This means they are not cancerous. But over time, some types of polyps can turn into cancer. Polyps that are smaller than a pea are usually not harmful. But larger polyps could someday become or may already be cancerous. To be safe, doctors remove all polyps and test them.   WHO GETS POLYPS? Anyone can get polyps, but certain people are more likely than others. You may have a greater chance of getting polyps if:  You are over 50.   You have had polyps before.   Someone in your family has had polyps.   Someone in your family has had cancer of the large  intestine.   Find out if someone in your family has had polyps. You may also be more likely to get polyps if you:   Eat a lot of fatty foods   Smoke   Drink alcohol   Do not exercise  Eat too much   PREVENTION There is not one sure way to prevent polyps. You might be able to lower your risk of getting them if you:  Eat more fruits and vegetables and less fatty food.   Do not smoke.   Avoid alcohol.   Exercise every day.   Lose weight if you are overweight.   Eating more calcium and folate can also lower your risk of getting polyps. Some foods that are rich in calcium are milk, cheese, and broccoli. Some foods that are rich in folate are chickpeas, kidney beans, and spinach.   Hemorrhoids Hemorrhoids are dilated (enlarged) veins around the rectum. Sometimes clots will form in the veins. This makes them swollen and painful. These are called thrombosed hemorrhoids. Causes of hemorrhoids include:  Constipation.   Straining to have a bowel movement.   HEAVY  LIFTING  HOME CARE INSTRUCTIONS  Eat a well balanced diet and drink 6 to 8 glasses of water every day to avoid constipation. You may also use a bulk laxative.   Avoid straining to have bowel movements.   Keep anal area dry and clean.   Do not use a donut shaped pillow or sit on the toilet for long periods. This increases blood pooling and pain.   Move your bowels when your body has the urge; this will require less straining and will decrease pain and pressure.

## 2017-09-05 NOTE — Anesthesia Postprocedure Evaluation (Signed)
Anesthesia Post Note  Patient: Katelyn Kelly  Procedure(s) Performed: COLONOSCOPY WITH PROPOFOL (N/A ) BIOPSY POLYPECTOMY  Patient location during evaluation: PACU Anesthesia Type: MAC Level of consciousness: awake and alert and oriented Pain management: pain level controlled Vital Signs Assessment: post-procedure vital signs reviewed and stable Respiratory status: spontaneous breathing Cardiovascular status: blood pressure returned to baseline and stable Postop Assessment: no apparent nausea or vomiting Anesthetic complications: no     Last Vitals:  Vitals:   09/05/17 1230 09/05/17 1254  BP: (!) 89/76 (!) 110/53  Pulse: 67 60  Resp: 18 16  Temp:  36.8 C  SpO2: 94% 93%    Last Pain:  Vitals:   09/05/17 1254  TempSrc: Oral  PainSc: 0-No pain                 Fern Canova

## 2017-09-05 NOTE — Anesthesia Procedure Notes (Signed)
Procedure Name: MAC Date/Time: 09/05/2017 11:32 AM Performed by: Vista Deck, CRNA Pre-anesthesia Checklist: Patient identified, Emergency Drugs available, Suction available, Timeout performed and Patient being monitored Patient Re-evaluated:Patient Re-evaluated prior to induction Oxygen Delivery Method: Non-rebreather mask

## 2017-09-05 NOTE — Op Note (Signed)
[facility] Patient Name: Katelyn Kelly Procedure Date: 09/05/2017 11:04 AM MRN: [id number] Date of Birth: [birth year] Attending MD: Barney Drain MD, MD CSN: [id number] Age: 56 Admit Type: Outpatient Procedure:                Colonoscopy WITH COLD FORCEPS BIOPSY/POLYPECTOMY Indications:              Screening in patient at increased risk: Family                            history of 1st-degree relative with colorectal                            cancer before age 72 years, Screening patient at                            increased risk: Family history of 1st-degree                            relative with colorectal cancer at age 56 years (or                            older) Providers:                Barney Drain MD, MD, Aram Candela, Lurline Del, RN Referring MD:             Teodora Medici. Kim Medicines:                Propofol per Anesthesia Complications:            No immediate complications. Estimated Blood Loss:     Estimated blood loss was minimal. Procedure:                Pre-Anesthesia Assessment:                           - Prior to the procedure, a History and Physical                            was performed, and patient medications and                            allergies were reviewed. The patient's tolerance of                            previous anesthesia was also reviewed. The risks                            and benefits of the procedure and the sedation                            options and risks were discussed with the patient.                            All questions were answered, and informed consent  was obtained. Prior Anticoagulants: The patient has                            taken aspirin, last dose was 1 day prior to                            procedure. ASA Grade Assessment: II - A patient                            with mild systemic disease. After reviewing the                            risks and benefits, the patient was  deemed in                            satisfactory condition to undergo the procedure.                            After obtaining informed consent, the colonoscope                            was passed under direct vision. Throughout the                            procedure, the patient's blood pressure, pulse, and                            oxygen saturations were monitored continuously. The                            EC-[id number] ([id number]) scope was introduced through                            the anus and advanced to the 3 cm into the ileum.                            The colonoscopy was technically difficult and                            complex due to a tortuous colon. Successful                            completion of the procedure was aided by COLOWRAP.                            The patient tolerated the procedure well. The                            quality of the bowel preparation was good. The                            ileocecal valve, appendiceal orifice, and rectum  were photographed. Scope In: 11:45:22 AM Scope Out: 12:02:57 PM Scope Withdrawal Time: 0 hours 15 minutes 40 seconds  Total Procedure Duration: 0 hours 17 minutes 35 seconds  Findings:      The terminal ileum appeared normal.      Five sessile polyps were found in the rectum and sigmoid colon. The       polyps were 3 to 4 mm in size. These polyps were removed with a cold       biopsy forceps. Resection and retrieval were complete.      The recto-sigmoid colon and sigmoid colon revealed moderately excessive       looping. Biopsies for histology were taken with a cold forceps from the       entire colon for evaluation of microscopic colitis.      A few small-mouthed diverticula were found in the cecum.      Internal hemorrhoids were found during retroflexion. The hemorrhoids       were small.      External hemorrhoids were found during digital exam. The hemorrhoids       were  large. Impression:               - The examined portion of the ileum was normal.                           - Five 3 to 4 mm polyps in the rectum and in the                            sigmoid colon, removed with a cold biopsy forceps.                            Resected and retrieved.                           - There was significant looping of the left colon.                           - Diverticulosis in the cecum.                           - Internal hemorrhoids.                           - External hemorrhoids. Moderate Sedation:      Per Anesthesia Care Recommendation:           - Repeat colonoscopy in 5 years for surveillance.                           - High fiber diet.                           - Continue present medications.                           - Await pathology results.                           - Patient has a contact number available for  emergencies. The signs and symptoms of potential                            delayed complications were discussed with the                            patient. Return to normal activities tomorrow.                            Written discharge instructions were provided to the                            patient. Procedure Code(s):        --- Professional ---                           [phone], Colonoscopy, flexible; with biopsy, single                            or multiple Diagnosis Code(s):        --- Professional ---                           K64.4, Residual hemorrhoidal skin tags                           K64.8, Other hemorrhoids                           K62.1, Rectal polyp                           D12.5, Benign neoplasm of sigmoid colon                           Z80.0, Family history of malignant neoplasm of                            digestive organs                           K57.30, Diverticulosis of large intestine without                            perforation or abscess without bleeding CPT copyright  2016 American Medical Association. All rights reserved. The codes documented in this report are preliminary and upon coder review may  be revised to meet current compliance requirements. Barney Drain, MD Barney Drain MD, MD 09/05/2017 12:17:51 PM This report has been signed electronically. Number of Addenda: 0

## 2017-09-05 NOTE — H&P (Addendum)
Primary Care Physician:  Jani Gravel, MD Primary Gastroenterologist:  Dr. Oneida Alar  Pre-Procedure History & Physical: HPI:  Katelyn Kelly is a 56 y.o. female here COLON CANCER SCREENING/for FAMILY Hx COLON CA: bro age < 31, father age > 32  Past Medical History:  Diagnosis Date  . Anxiety and depression   . Bilateral carpal tunnel syndrome   . Chronic back pain   . Chronic neck pain   . Depression   . Diabetes mellitus   . H/O syncope   . Headache   . Hypertension   . Manic depression ([place])   . Panic attack     Past Surgical History:  Procedure Laterality Date  . ABDOMINAL HYSTERECTOMY    . ABDOMINAL SURGERY    . APPENDECTOMY    . CARPAL TUNNEL RELEASE    . CHOLECYSTECTOMY    . DILATION AND CURETTAGE OF UTERUS    . TUBAL LIGATION      Prior to Admission medications   Medication Sig Start Date End Date Taking? Authorizing Provider  acetaminophen (TYLENOL) 500 MG tablet Take 1,500 mg by mouth every 6 (six) hours as needed for mild pain or moderate pain.    Yes [provider]  ALPRAZolam (XANAX) 0.25 MG tablet Take 0.25 mg by mouth 2 (two) times daily.   Yes [provider]  aspirin EC 81 MG tablet Take 81 mg by mouth daily.   Yes [provider]  atorvastatin (LIPITOR) 10 MG tablet Take 10 mg by mouth daily.   Yes [provider]  citalopram (CELEXA) 40 MG tablet Take 40 mg by mouth daily.   Yes [provider]  gabapentin (NEURONTIN) 100 MG capsule Take 100 mg by mouth 3 (three) times daily.   Yes [provider]  hydrOXYzine (ATARAX/VISTARIL) 25 MG tablet Take 50 mg by mouth 2 (two) times daily.    Yes [provider]  Hyprom-Naphaz-Polysorb-Zn Sulf (CLEAR EYES COMPLETE OP) Apply 1-2 drops to eye 2 (two) times daily as needed (dryness).    Yes [provider]  lisinopril (PRINIVIL,ZESTRIL) 10 MG tablet Take 10 mg by mouth daily.   Yes [provider]  metFORMIN (GLUCOPHAGE) 500 MG tablet  Take 1,000 mg by mouth 2 (two) times daily with a meal.    Yes [provider]  Na Sulfate-K Sulfate-Mg Sulf (SUPREP BOWEL PREP KIT) 17.5-3.13-1.6 GM/180ML SOLN Take 1 kit by mouth as directed. 08/07/17  Yes Dasani Thurlow L, MD  Na Sulfate-K Sulfate-Mg Sulf 17.5-3.13-1.6 GM/177ML SOLN Take 1 kit as directed by mouth. 09/04/17  Yes Talani Brazee L, MD  Topiramate (TOPAMAX PO) Take 50 mg by mouth 2 (two) times daily. Takes 25 mg tablets    2 in the Am and 3 in the pm   Yes [provider]  dicyclomine (BENTYL) 10 MG capsule Take 1 capsule (10 mg total) by mouth 4 (four) times daily as needed for spasms. (AT MEALS AND AT BEDTIME) 08/02/17   Carlis Stable, NP  meloxicam (MOBIC) 15 MG tablet Take 15 mg by mouth daily.    [provider]    Allergies as of 08/07/2017 - Review Complete 08/02/2017  Allergen Reaction Noted  . Sinequan [doxepin hcl] Anaphylaxis 01/10/2012  . Methylphenidate derivatives Hives 07/28/2013  . Tranxene [clorazepate dipotassium] Hives and Swelling 01/10/2012    Family History  Problem Relation Age of Onset  . Diabetes Mother   . Stroke Mother   . Colon cancer Father 31  Passed away from colon cancer  . Colon cancer Brother 23       Passed away from colon cancer    Social History   Socioeconomic History  . Marital status: Legally Separated    Spouse name: Not on file  . Number of children: Not on file  . Years of education: Not on file  . Highest education level: Not on file  Social Needs  . Financial resource strain: Not on file  . Food insecurity - worry: Not on file  . Food insecurity - inability: Not on file  . Transportation needs - medical: Not on file  . Transportation needs - non-medical: Not on file  Occupational History  . Not on file  Tobacco Use  . Smoking status: Current Every Day Smoker    Packs/day: 1.50    Years: 40.00    Pack years: 60.00    Types: Cigarettes  . Smokeless tobacco: Never Used  . Tobacco  comment: one pack a day  Substance and Sexual Activity  . Alcohol use: No  . Drug use: No  . Sexual activity: Not on file  Other Topics Concern  . Not on file  Social History Narrative  . Not on file    Review of Systems: See HPI, otherwise negative ROS   Physical Exam: BP 128/80   Temp 98.3 F (36.8 C) (Oral)   Resp (!) 26   SpO2 96%  General:   Alert,  pleasant and cooperative in NAD Head:  Normocephalic and atraumatic. Neck:  Supple; Lungs:  Clear throughout to auscultation.    Heart:  Regular rate and rhythm. Abdomen:  Soft, nontender and nondistended. Normal bowel sounds, without guarding, and without rebound.   Neurologic:  Alert and  oriented x4;  grossly normal neurologically.  Impression/Plan:     COLON CANCER SCREENING/for FAMILY Hx COLON CA: bro age < 67, father age > 26    PLAN: [place]. DISCUSSED PROCEDURE, BENEFITS, & RISKS: < 1% chance of medication reaction, bleeding, perforation, or rupture of spleen/liver.

## 2017-09-07 ENCOUNTER — Encounter (HOSPITAL_COMMUNITY): Payer: Self-pay | Admitting: Gastroenterology

## 2017-09-13 ENCOUNTER — Telehealth: Payer: Self-pay | Admitting: Gastroenterology

## 2017-09-13 NOTE — Telephone Encounter (Signed)
Please call pt. She had HYPERPLASTIC POLYPS removed.  Her colon Bx are normal.   TO REDUCE YOUR RISK FOR COLON CANCER: 1. STOP SMOKING AND 2. LOSE 20 LBS. CONTINUE YOUR WEIGHT LOSS EFFORTS.  DRINK WATER TO KEEP YOUR URINE LIGHT YELLOW.  FOLLOW A HIGH FIBER DIET. AVOID ITEMS THAT CAUSE BLOATING & GAS.    FOLLOW UP IN 4 MOS E30 DIARRHEA/ABDOMINAL PAIN.  Next colonoscopy in 5 years.

## 2017-09-14 NOTE — Telephone Encounter (Signed)
lmovm

## 2017-09-14 NOTE — Telephone Encounter (Signed)
cc'ed to pcp, reminder in epic. OV made

## 2017-09-14 NOTE — Telephone Encounter (Signed)
I spoke with patient about results and she verbalized understanding and had no questions 

## 2017-11-08 ENCOUNTER — Encounter: Payer: Self-pay | Admitting: Nurse Practitioner

## 2017-11-08 ENCOUNTER — Ambulatory Visit (INDEPENDENT_AMBULATORY_CARE_PROVIDER_SITE_OTHER): Payer: Medicare Other | Admitting: Nurse Practitioner

## 2017-11-08 VITALS — BP 129/79 | HR 78 | Temp 97.0°F | Ht 61.5 in | Wt 188.2 lb

## 2017-11-08 DIAGNOSIS — K219 Gastro-esophageal reflux disease without esophagitis: Secondary | ICD-10-CM | POA: Insufficient documentation

## 2017-11-08 DIAGNOSIS — K582 Mixed irritable bowel syndrome: Secondary | ICD-10-CM

## 2017-11-08 DIAGNOSIS — R1310 Dysphagia, unspecified: Secondary | ICD-10-CM

## 2017-11-08 NOTE — Progress Notes (Signed)
cc'ed to pcp °

## 2017-11-08 NOTE — Patient Instructions (Signed)
1. At this point continue your treatments for your irritable bowel syndrome type.  If you are predominantly constipated he will be taking MiraLAX more than Bentyl.  Again, take MiraLAX when you are constipated and Bentyl when you have diarrhea. 2. We will help you schedule the swallowing study for you. 3. Continue your other current medications.  Specifically, continue your acid blocker (omeprazole). 4. Return for follow-up in 3 months. 5. Call us if you have any questions or concerns.

## 2017-11-08 NOTE — Assessment & Plan Note (Signed)
Technically no solid food dysphagia symptoms.  However, she feels like food gets stuck at the top of her stomach.  Unsure if this is in her stomach or in her esophagus.  Her primary care told her she likely has a hernia.  At this point I will check a barium pill esophagram for any stricture, significant reflux, hiatal hernia.  Return for follow-up in 3 months.

## 2017-11-08 NOTE — Progress Notes (Signed)
Referring Provider: Jani Gravel, MD Primary Care Physician:  Jani Gravel, MD Primary GI:  Dr. Oneida Alar  Chief Complaint  Patient presents with  . Irritable Bowel Syndrome    f/u.   Marland Kitchen Abdominal Pain    burning sensation in abd  . Nausea    no vomiting    HPI:   Katelyn Kelly is a 57 y.o. female who presents for follow-up on IBS and abdominal pain.  The patient was last seen in our office 08/02/2017 for family history of colon cancer, IBS with both constipation and diarrhea.  At her last visit she was doing well overall, noted she never had a colonoscopy despite family history of colon cancer.  She described abdominal pain in the mid abdomen and primary care told her they thought it was a hernia but not sure.  Persistent nausea but no vomiting.  No red flag/warning signs or symptoms.  Has variable bowel movements from constipation to diarrhea.  Abdominal pain improves with a bowel movement.  She describes her stools as 30-40% constipated, 20% loose, 40-50% normal.  Symptoms last in cycles of about a few days each.  No other GI symptoms.  Recommended scheduling of colonoscopy.  Use MiraLAX up to 2 times a day for constipation and Bentyl 10 mg up to 4 times a day for diarrhea.  Further recommendations after colonoscopy, follow-up in 3 months.  Colonoscopy was completed on 09/05/2017 and found five sessile polyps in the rectum and sigmoid colon measuring 3-4 mm in size.  Rectosigmoid colon and sigmoid colon with moderately excessive looping.  Histological biopsies from the entire colon to evaluate for possible microscopic colitis.  A few small mouth diverticula in the cecum.  Internal hemorrhoids noted to be small, external hemorrhoids were large.  Surgical pathology found the polyps to be hyperplastic.  The random colon biopsies were benign colonic mucosa.  Recommended repeat colonoscopy in 5 years, high-fiber diet, continue present medications, stop smoking, lose 20 pounds through continued  weight loss efforts.  Today she states she's doing ok overall. Her mom passed away a couple months ago from a brief battle of colon cancer. She has been under a lot of stress due to this. She is tearful today taking about it. She is also trying to help her brother who is addicted to drugs. Her IBS is still alternating diarrhea and constipation. She is on Celexa. Her lower abdominal/IBS pain is about the same. Is using alternation options based on her stools. She has mid-abdominal issues and was told by PCP she has a hernia and recommended she discuss it with [place]. She feels like when she eats her food gets stuck at the "top of her stomach." Food passes after some amount of time. No regurgitation. Has postprandial bloating as well. Denies solid food dysphagia. Has GERD symptoms, started on omeprazole. Zofran helps with her nausea. Denies hematochezia, melena, unintentional weight loss, fever, chills. Denies chest pain, dyspnea, dizziness, lightheadedness, syncope, near syncope. Denies any other upper or lower GI symptoms.  Past Medical History:  Diagnosis Date  . Anxiety and depression   . Bilateral carpal tunnel syndrome   . Chronic back pain   . Chronic neck pain   . Depression   . Diabetes mellitus   . H/O syncope   . Headache   . Hypertension   . Manic depression ([place])   . Panic attack     Past Surgical History:  Procedure Laterality Date  . ABDOMINAL HYSTERECTOMY    .  ABDOMINAL SURGERY    . APPENDECTOMY    . BIOPSY  09/05/2017   Procedure: BIOPSY;  Surgeon: [name], MD;  [facility]: [facility];  Service: Endoscopy;;  random colon  . CARPAL TUNNEL RELEASE    . CHOLECYSTECTOMY    . COLONOSCOPY WITH PROPOFOL N/A 09/05/2017   Procedure: COLONOSCOPY WITH PROPOFOL;  Surgeon: [name], MD;  Location: [facility];  Service: Endoscopy;  Laterality: N/A;  10:45AM  . DILATION AND CURETTAGE OF UTERUS    . POLYPECTOMY  09/05/2017   Procedure: POLYPECTOMY;  Surgeon: [name], MD;  Location: [facility];  Service: Endoscopy;;  sigmoid and rectal  . TUBAL LIGATION      Current Outpatient Medications  Medication Sig Dispense Refill  . acetaminophen (TYLENOL) 500 MG tablet Take 1,500 mg by mouth every 6 (six) hours as needed for mild pain or moderate pain.     Marland Kitchen ALPRAZolam (XANAX) 0.25 MG tablet Take 0.25 mg by mouth 2 (two) times daily.    Marland Kitchen aspirin EC 81 MG tablet Take 81 mg by mouth daily.    Marland Kitchen atorvastatin (LIPITOR) 10 MG tablet Take 10 mg by mouth daily.    . citalopram (CELEXA) 40 MG tablet Take 40 mg by mouth daily.    Marland Kitchen gabapentin (NEURONTIN) 100 MG capsule Take 100 mg by mouth 3 (three) times daily.    . hydrOXYzine (ATARAX/VISTARIL) 25 MG tablet Take 50 mg by mouth 2 (two) times daily.     . Hyprom-Naphaz-Polysorb-Zn Sulf (CLEAR EYES COMPLETE OP) Apply 1-2 drops to eye 2 (two) times daily as needed (dryness).     Marland Kitchen lisinopril (PRINIVIL,ZESTRIL) 10 MG tablet Take 10 mg by mouth daily.    . meloxicam (MOBIC) 15 MG tablet Take 15 mg by mouth daily.    . metFORMIN (GLUCOPHAGE) 500 MG tablet Take 1,000 mg by mouth 2 (two) times daily with a meal.     . omeprazole (PRILOSEC) 20 MG capsule Take 20 mg by mouth daily.    . ondansetron (ZOFRAN) 4 MG tablet Take 4 mg by mouth every 8 (eight) hours as needed for nausea or vomiting.    . Topiramate (TOPAMAX PO) Take 50 mg by mouth 2 (two) times daily. Takes 25 mg tablets    2 in the Am and 3 in the pm    . dicyclomine (BENTYL) 10 MG capsule Take 1 capsule (10 mg total) by mouth 4 (four) times daily as needed for spasms. (AT MEALS AND AT BEDTIME) (Patient not taking: Reported on 11/08/2017) 90 capsule 2   No current facility-administered medications for this visit.     Allergies as of 11/08/2017 - Review Complete 11/08/2017  Allergen Reaction Noted  . Sinequan [doxepin hcl] Anaphylaxis 01/10/2012  . Methylphenidate derivatives Hives 07/28/2013  . Tranxene [clorazepate dipotassium] Hives and Swelling 01/10/2012     Family History  Problem Relation Age of Onset  . Diabetes Mother   . Stroke Mother   . Colon cancer Father 30       Passed away from colon cancer  . Colon cancer Brother 76       Passed away from colon cancer    Social History   Socioeconomic History  . Marital status: Legally Separated    Spouse name: None  . Number of children: None  . Years of education: None  . Highest education level: None  Social Needs  . Financial resource strain: None  . Food insecurity - worry:  None  . Food insecurity - inability: None  . Transportation needs - medical: None  . Transportation needs - non-medical: None  Occupational History  . None  Tobacco Use  . Smoking status: Current Every Day Smoker    Packs/day: 1.50    Years: 40.00    Pack years: 60.00    Types: Cigarettes  . Smokeless tobacco: Never Used  . Tobacco comment: one pack a day  Substance and Sexual Activity  . Alcohol use: No  . Drug use: No  . Sexual activity: None  Other Topics Concern  . None  Social History Narrative  . None    Review of Systems: Complete ROS negative except as per HPI.   Physical Exam: BP 129/79   Pulse 78   Temp (!) 97 F (36.1 C) (Oral)   Ht 5' 1.5" (1.562 m)   Wt 188 lb 3.2 oz (85.4 kg)   BMI 34.98 kg/m  General:   Alert and oriented. Pleasant and cooperative. Well-nourished and well-developed.  Eyes:  Without icterus, sclera clear and conjunctiva pink.  Ears:  Normal auditory acuity. Cardiovascular:  S1, S2 present without murmurs appreciated. Extremities without clubbing or edema. Respiratory:  Clear to auscultation bilaterally. No wheezes, rales, or rhonchi. No distress.  Gastrointestinal:  +BS, soft, and non-distended. Mild lower abdominal TTP. No HSM noted. No guarding or rebound. No masses appreciated.  Rectal:  Deferred  Musculoskalatal:  Symmetrical without gross deformities. Neurologic:  Alert and oriented x4;  grossly normal neurologically. Psych:  Alert and  cooperative. Normal mood and affect. Heme/Lymph/Immune: No excessive bruising noted.    11/08/2017 2:03 PM   Disclaimer: This note was dictated with voice recognition software. Similar sounding words can inadvertently be transcribed and may not be corrected upon review.

## 2017-11-08 NOTE — Assessment & Plan Note (Signed)
Continues with irritable bowel syndrome treatments.  Alternates MiraLAX and Bentyl depending on her symptoms.  The majority of her symptoms tend to be constipation.  Recommend she continue her current medications.  Her IBS is not helped by the recent increased stress with the passing of her mother.  Return for follow-up in 3 months.

## 2017-11-08 NOTE — Assessment & Plan Note (Signed)
New onset GERD started on Prilosec which helps her symptoms.  Recommend she continue her PPI.  Return for follow-up in 3 months.

## 2017-11-14 ENCOUNTER — Ambulatory Visit (HOSPITAL_COMMUNITY)
Admission: RE | Admit: 2017-11-14 | Discharge: 2017-11-14 | Disposition: A | Discharge status: Home / Self Care | Payer: Medicare Other | Source: Ambulatory Visit | Attending: Nurse Practitioner | Admitting: Nurse Practitioner

## 2017-11-14 DIAGNOSIS — R1310 Dysphagia, unspecified: Secondary | ICD-10-CM | POA: Diagnosis present

## 2017-11-14 DIAGNOSIS — K582 Mixed irritable bowel syndrome: Secondary | ICD-10-CM | POA: Insufficient documentation

## 2017-11-14 DIAGNOSIS — K219 Gastro-esophageal reflux disease without esophagitis: Secondary | ICD-10-CM | POA: Insufficient documentation

## 2017-11-16 NOTE — Progress Notes (Signed)
Pt is aware of results and plan. She also asked if Randall Hiss was going to do anything about her hernia. I told her that I do not see anything mentioned about a hernia. She said she spoke with him about it at the office visit. I told her I will send a note to him for advise.

## 2018-02-14 ENCOUNTER — Encounter: Payer: Self-pay | Admitting: Nurse Practitioner

## 2018-02-14 ENCOUNTER — Ambulatory Visit (INDEPENDENT_AMBULATORY_CARE_PROVIDER_SITE_OTHER): Payer: Medicare Other | Admitting: Nurse Practitioner

## 2018-02-14 ENCOUNTER — Other Ambulatory Visit: Payer: Self-pay

## 2018-02-14 VITALS — BP 107/73 | HR 84 | Temp 97.3°F | Ht 61.5 in | Wt 180.2 lb

## 2018-02-14 DIAGNOSIS — Z800 Family history of malignant neoplasm of digestive organs: Secondary | ICD-10-CM | POA: Diagnosis not present

## 2018-02-14 DIAGNOSIS — K582 Mixed irritable bowel syndrome: Secondary | ICD-10-CM | POA: Diagnosis not present

## 2018-02-14 DIAGNOSIS — R112 Nausea with vomiting, unspecified: Secondary | ICD-10-CM | POA: Insufficient documentation

## 2018-02-14 DIAGNOSIS — R69 Illness, unspecified: Secondary | ICD-10-CM

## 2018-02-14 DIAGNOSIS — K219 Gastro-esophageal reflux disease without esophagitis: Secondary | ICD-10-CM

## 2018-02-14 MED ORDER — DICYCLOMINE HCL 10 MG PO CAPS: 10.0000 mg | ORAL_CAPSULE | Freq: Four times a day (QID) | ORAL | 2 refills | Status: DC | PRN

## 2018-02-14 MED ORDER — DEXLANSOPRAZOLE 60 MG PO CPDR: 60.0000 mg | DELAYED_RELEASE_CAPSULE | Freq: Every day | ORAL | 0 refills | Status: DC

## 2018-02-14 NOTE — Assessment & Plan Note (Signed)
Persistent IBS symptoms.  She states she ran out of her medication some time ago and did not request refills or call our office.  This point I will refill her regimen including over-the-counter MiraLAX and Bentyl.  Medications dosed per patient symptoms given that she has mixed IBS.  Follow-up in 3 months.  Call if any questions or concerns.

## 2018-02-14 NOTE — Progress Notes (Addendum)
REVIEWED-NO ADDITIONAL RECOMMENDATIONS.  Referring Provider: Jani Gravel, MD Primary Care Physician:  Jani Gravel, MD Primary GI:  Dr. Oneida Alar  Chief Complaint  Patient presents with  . Gastroesophageal Reflux    f/u. C/o gas, "spitting up phlem", nausea  . Irritable Bowel Syndrome    HPI:   Katelyn CHAVERO is a 57 y.o. female who presents for follow-up on IBS and GERD.  The patient was last seen in our office 11/08/2017 for the same as well as dysphasia.  Noted family history of colon cancer, IBS with both constipation and diarrhea.  First ever colonoscopy was completed 09/05/2017 which found five sessile polyps in the rectum and sigmoid colon measuring 3-4 mm in size.  Rectosigmoid colon and sigmoid colon with moderately excessive looping.  Histological biopsies from the entire colon to evaluate for possible microscopic colitis.  A few small mouth diverticula in the cecum.  Internal hemorrhoids noted to be small, external hemorrhoids were large.  Surgical pathology found the polyps to be hyperplastic.  The random colon biopsies were benign colonic mucosa.  Recommended repeat colonoscopy in 5 years, high-fiber diet, continue present medications, stop smoking, lose 20 pounds through continued weight loss efforts.  At her last visit she was doing okay overall, a lot of stress related to mother's recent passing with brief battle with cancer.  She was tearful at her last visit while talking about it.  Still with IBS mixed type.  She is on Celexa.  Noted "food getting stuck at the top of my stomach" when she eats although it generally passes with time.  Postprandial bloating, GERD symptoms although she had recently been started on omeprazole.  Zofran helps her nausea.  No other GI symptoms.  Recommended continue IBS treatments including MiraLAX and Bentyl as needed.  Barium pill esophagram, continue other medications, follow-up in 3 months.  BPE was completed 11/14/2017 which found laryngeal penetration  without aspiration otherwise normal.  Today she states she's doing ok. Occasionally takes her Prilosec every 8 hours. She was on Prilosec daily. Still will has esophageal burning which is persistent. Frequent phlegm. Has persistent nausea/vomiting, takes Zofran (prefers ODT formulation). Denies hematochezia, melena. Still with IBS-mixed. States she's out of those medications "for some time." Did not call for refill. She was on MiraLAZ and Bentyl dosed per symptoms. Denies abdominal pain, fever, chills, unintentional weight loss. Denies chest pain, dyspnea, dizziness, lightheadedness, syncope, near syncope. Denies any other upper or lower GI symptoms.  Past Medical History:  Diagnosis Date  . Anxiety and depression   . Bilateral carpal tunnel syndrome   . Chronic back pain   . Chronic neck pain   . Depression   . Diabetes mellitus   . H/O syncope   . Headache   . Hypertension   . Manic depression ([place])   . Panic attack     Past Surgical History:  Procedure Laterality Date  . ABDOMINAL HYSTERECTOMY    . ABDOMINAL SURGERY    . APPENDECTOMY    . BIOPSY  09/05/2017   Procedure: BIOPSY;  Surgeon: [name], MD;  [facility];  Service: Endoscopy;;  random colon  . CARPAL TUNNEL RELEASE    . CHOLECYSTECTOMY    . COLONOSCOPY WITH PROPOFOL N/A 09/05/2017   Procedure: COLONOSCOPY WITH PROPOFOL;  Surgeon: [name], MD;  [facility];  Service: Endoscopy;  Laterality: N/A;  10:45AM  . DILATION AND CURETTAGE OF UTERUS    . POLYPECTOMY  09/05/2017  Procedure: POLYPECTOMY;  Surgeon: [name], MD;  [facility];  Service: Endoscopy;;  sigmoid and rectal  . TUBAL LIGATION      Current Outpatient Medications  Medication Sig Dispense Refill  . acetaminophen (TYLENOL) 500 MG tablet Take 1,500 mg by mouth every 6 (six) hours as needed for mild pain or moderate pain.     Marland Kitchen ALPRAZolam (XANAX) 0.25 MG tablet Take 0.25 mg by mouth 2 (two) times daily  as needed.     Marland Kitchen aspirin EC 81 MG tablet Take 81 mg by mouth daily.    Marland Kitchen atorvastatin (LIPITOR) 10 MG tablet Take 10 mg by mouth at bedtime.     . DULoxetine (CYMBALTA) 60 MG capsule Take 60 mg by mouth daily.    Marland Kitchen gabapentin (NEURONTIN) 100 MG capsule Take 100 mg by mouth 3 (three) times daily.    . Hyprom-Naphaz-Polysorb-Zn Sulf (CLEAR EYES COMPLETE OP) Apply 1-2 drops to eye 2 (two) times daily as needed (dryness).     Marland Kitchen lisinopril (PRINIVIL,ZESTRIL) 10 MG tablet Take 10 mg by mouth daily.    . metFORMIN (GLUCOPHAGE) 500 MG tablet Take 1,000 mg by mouth 2 (two) times daily with a meal.     . omeprazole (PRILOSEC) 20 MG capsule Take 20 mg by mouth every 8 (eight) hours.     . ondansetron (ZOFRAN) 4 MG tablet Take 4 mg by mouth every 8 (eight) hours as needed for nausea or vomiting.    . Topiramate (TOPAMAX PO) Takes 25 mg tablets    2 in the Am and 3 in the pm    . dicyclomine (BENTYL) 10 MG capsule Take 1 capsule (10 mg total) by mouth 4 (four) times daily as needed for spasms. (AT MEALS AND AT BEDTIME) (Patient not taking: Reported on 11/08/2017) 90 capsule 2  . hydrOXYzine (ATARAX/VISTARIL) 25 MG tablet Take 50 mg by mouth 2 (two) times daily.     . meloxicam (MOBIC) 15 MG tablet Take 15 mg by mouth daily.     No current facility-administered medications for this visit.     Allergies as of 02/14/2018 - Review Complete 02/14/2018  Allergen Reaction Noted  . Sinequan [doxepin hcl] Anaphylaxis 01/10/2012  . Methylphenidate derivatives Hives 07/28/2013  . Tranxene [clorazepate dipotassium] Hives and Swelling 01/10/2012    Family History  Problem Relation Age of Onset  . Diabetes Mother   . Stroke Mother   . Colon cancer Father 77       Passed away from colon cancer  . Colon cancer Brother 69       Passed away from colon cancer    Social History   Socioeconomic History  . Marital status: Legally Separated    Spouse name: Not on file  . Number of children: Not on file  . Years  of education: Not on file  . Highest education level: Not on file  Occupational History  . Not on file  Social Needs  . Financial resource strain: Not on file  . Food insecurity:    Worry: Not on file    Inability: Not on file  . Transportation needs:    Medical: Not on file    Non-medical: Not on file  Tobacco Use  . Smoking status: Current Every Day Smoker    Packs/day: 1.50    Years: 40.00    Pack years: 60.00    Types: Cigarettes  . Smokeless tobacco: Never Used  . Tobacco comment: one pack a day  Substance and Sexual Activity  . Alcohol use: No  . Drug use: No  . Sexual activity: Not on file  Lifestyle  . Physical activity:    Days per week: Not on file    Minutes per session: Not on file  . Stress: Not on file  Relationships  . Social connections:    Talks on phone: Not on file    Gets together: Not on file    Attends religious service: Not on file    Active member of club or organization: Not on file    Attends meetings of clubs or organizations: Not on file    Relationship status: Not on file  Other Topics Concern  . Not on file  Social History Narrative  . Not on file    Review of Systems: Complete ROS negative except as per HPI.   Physical Exam: BP 107/73   Pulse 84   Temp (!) 97.3 F (36.3 C) (Oral)   Ht 5' 1.5" (1.562 m)   Wt 180 lb 3.2 oz (81.7 kg)   BMI 33.50 kg/m  General:   Alert and oriented. Pleasant and cooperative. Well-nourished and well-developed.  Eyes:  Without icterus, sclera clear and conjunctiva pink.  Ears:  Normal auditory acuity. Cardiovascular:  S1, S2 present without murmurs appreciated. Extremities without clubbing or edema. Respiratory:  Clear to auscultation bilaterally. No wheezes, rales, or rhonchi. No distress.  Gastrointestinal:  +BS, soft, and non-distended. Noted epigastric TTP. No HSM noted. No guarding or rebound. No masses appreciated.  Rectal:  Deferred  Musculoskalatal:  Symmetrical without gross  deformities. Neurologic:  Alert and oriented x4;  grossly normal neurologically. Psych:  Alert and cooperative. Normal mood and affect. Heme/Lymph/Immune: No excessive bruising noted.    02/14/2018 1:59 PM   Disclaimer: This note was dictated with voice recognition software. Similar sounding words can inadvertently be transcribed and may not be corrected upon review.

## 2018-02-14 NOTE — Assessment & Plan Note (Signed)
GERD not optimally controlled.  Has persistent nausea and vomiting as well as esophageal burning, bitter taste in her mouth.  Noted epigastric tenderness on exam.  She was on Prilosec daily.  She started taking this more frequently, as much as 3 times a day.  I will have her stop Prilosec.  Start Dexilant 60 mg with samples to last 1-2 weeks.  I requested a progress report in 1-2 weeks.  We will also plan for upper endoscopy to further evaluate given her persistent symptoms on PPI associated with persistent nausea and vomiting.  Follow-up in 3 months.  Proceed with EGD on propofol/MAC with Dr. Oneida Alar in near future: the risks, benefits, and alternatives have been discussed with the patient in detail. The patient states understanding and desires to proceed.  The patient is currently on Neurontin, Cymbalta, Xanax.  No other anticoagulants, anxiolytics, chronic pain medications, or antidepressants.  She seems to have a significant amount of anxiety.  We will plan for propofol/MAC to promote adequate sedation.

## 2018-02-14 NOTE — Patient Instructions (Signed)
1. Stop taking omeprazole (Prilosec) for now. 2. Start taking Dexilant 60 mg once a day.  Take this in the morning, before you eat anything. 3. I am giving you samples of Dexilant to last 1-2 weeks. 4. Call us in 1-2 weeks and let us know if it is helping. 5. I have sent a refill of Bentyl to your pharmacy. 6. You can pick up more MiraLAX over-the-counter at your pharmacy. 7. Take MiraLAX and Bentyl based on previous recommendations, depending on what her stools are like at the time. 8. We will help schedule your endoscopy for you. 9. Follow-up in 3 months. 10. Call us if you have any questions or concerns.   It was good to see you today!  I hope you have a good start summer!!     At [facility] we value your feedback. You may receive a survey about your visit today. Please share your experience as we strive to create trusting relationships with our patients to provide genuine, compassionate, quality care.

## 2018-02-14 NOTE — Progress Notes (Signed)
cc'ed to pcp °

## 2018-02-14 NOTE — Assessment & Plan Note (Signed)
The patient notes persistent nausea and vomiting.  Zofran helps.  ODT formulation works better.  This is all in the setting of known chronic GERD.  Her Prilosec is not working very well, as per above.  We will change her medications as per above.  Continue Zofran.  EGD as per above given persistent GERD despite appropriate PPI and ongoing nausea and vomiting.  Follow-up in 3 months.

## 2018-02-15 ENCOUNTER — Telehealth: Payer: Self-pay

## 2018-02-15 NOTE — Telephone Encounter (Signed)
Called at informed pt of pre-op appt 04/24/18 at 10:00am. Letter mailed.

## 2018-04-20 NOTE — Patient Instructions (Signed)
Katelyn Kelly  04/20/2018     @PREFPERIOPPHARMACY @   Your procedure is scheduled on  05/01/2018   Report to [facility] at  800   A.M.  Call this number if you have problems the morning of surgery:  [phone]   Remember:  Do not eat or drink after midnight.  You may drink clear liquids until (follow the instructions given to you) .  Clear liquids allowed are:                    Water, Juice (non-citric and without pulp), Carbonated beverages, Clear Tea, Black Coffee only, Plain Jell-O only, Gatorade and Plain Popsicles only    Take these medicines the morning of surgery with A SIP OF WATER  Xanax, neurontin, zofran, topamax.    Do not wear jewelry, make-up or nail polish.  Do not wear lotions, powders, or perfumes, or deodorant.  Do not shave 48 hours prior to surgery.  Men may shave face and neck.  Do not bring valuables to the hospital.  [facility] is not responsible for any belongings or valuables.  Contacts, dentures or bridgework may not be worn into surgery.  Leave your suitcase in the car.  After surgery it may be brought to your room.  For patients admitted to the hospital, discharge time will be determined by your treatment team.  Patients discharged the day of surgery will not be allowed to drive home.   Name and phone number of your driver:   family Special instructions:  Follow the diet and prep instructions given to you by Dr Nona Dell office.  Please read over the following fact sheets that you were given. Anesthesia Post-op Instructions and Care and Recovery After Surgery       Esophagogastroduodenoscopy Esophagogastroduodenoscopy (EGD) is a procedure to examine the lining of the esophagus, stomach, and first part of the small intestine (duodenum). This procedure is done to check for problems such as inflammation, bleeding, ulcers, or growths. During this procedure, a long, flexible, lighted tube with a camera attached (endoscope) is  inserted down the throat. Tell a health care provider about:  Any allergies you have.  All medicines you are taking, including vitamins, herbs, eye drops, creams, and over-the-counter medicines.  Any problems you or family members have had with anesthetic medicines.  Any blood disorders you have.  Any surgeries you have had.  Any medical conditions you have.  Whether you are pregnant or may be pregnant. What are the risks? Generally, this is a safe procedure. However, problems may occur, including:  Infection.  Bleeding.  A tear (perforation) in the esophagus, stomach, or duodenum.  Trouble breathing.  Excessive sweating.  Spasms of the larynx.  A slowed heartbeat.  Low blood pressure.  What happens before the procedure?  Follow instructions from your health care provider about eating or drinking restrictions.  Ask your health care provider about: ? Changing or stopping your regular medicines. This is especially important if you are taking diabetes medicines or blood thinners. ? Taking medicines such as aspirin and ibuprofen. These medicines can thin your blood. Do not take these medicines before your procedure if your health care provider instructs you not to.  Plan to have someone take you home after the procedure.  If you wear dentures, be ready to remove them before the procedure. What happens during the procedure?  To reduce your risk of infection, your health care team  will wash or sanitize their hands.  An IV tube will be put in a vein in your hand or arm. You will get medicines and fluids through this tube.  You will be given one or more of the following: ? A medicine to help you relax (sedative). ? A medicine to numb the area (local anesthetic). This medicine may be sprayed into your throat. It will make you feel more comfortable and keep you from gagging or coughing during the procedure. ? A medicine for pain.  A mouth guard may be placed in your  mouth to protect your teeth and to keep you from biting on the endoscope.  You will be asked to lie on your left side.  The endoscope will be lowered down your throat into your esophagus, stomach, and duodenum.  Air will be put into the endoscope. This will help your health care provider see better.  The lining of your esophagus, stomach, and duodenum will be examined.  Your health care provider may: ? Take a tissue sample so it can be looked at in a lab (biopsy). ? Remove growths. ? Remove objects (foreign bodies) that are stuck. ? Treat any bleeding with medicines or other devices that stop tissue from bleeding. ? Widen (dilate) or stretch narrowed areas of your esophagus and stomach.  The endoscope will be taken out. The procedure may vary among health care providers and hospitals. What happens after the procedure?  Your blood pressure, heart rate, breathing rate, and blood oxygen level will be monitored often until the medicines you were given have worn off.  Do not eat or drink anything until the numbing medicine has worn off and your gag reflex has returned. This information is not intended to replace advice given to you by your health care provider. Make sure you discuss any questions you have with your health care provider. Document Released: 02/17/2005 Document Revised: 03/24/2016 Document Reviewed: 09/10/2015 Elsevier Interactive Patient Education  2018 [employer]. Esophagogastroduodenoscopy, Care After Refer to this sheet in the next few weeks. These instructions provide you with information about caring for yourself after your procedure. Your health care provider may also give you more specific instructions. Your treatment has been planned according to current medical practices, but problems sometimes occur. Call your health care provider if you have any problems or questions after your procedure. What can I expect after the procedure? After the procedure, it is common to  have:  A sore throat.  Nausea.  Bloating.  Dizziness.  Fatigue.  Follow these instructions at home:  Do not eat or drink anything until the numbing medicine (local anesthetic) has worn off and your gag reflex has returned. You will know that the local anesthetic has worn off when you can swallow comfortably.  Do not drive for 24 hours if you received a medicine to help you relax (sedative).  If your health care provider took a tissue sample for testing during the procedure, make sure to get your test results. This is your responsibility. Ask your health care provider or the department performing the test when your results will be ready.  Keep all follow-up visits as told by your health care provider. This is important. Contact a health care provider if:  You cannot stop coughing.  You are not urinating.  You are urinating less than usual. Get help right away if:  You have trouble swallowing.  You cannot eat or drink.  You have throat or chest pain that gets worse.  You  are dizzy or light-headed.  You faint.  You have nausea or vomiting.  You have chills.  You have a fever.  You have severe abdominal pain.  You have black, tarry, or bloody stools. This information is not intended to replace advice given to you by your health care provider. Make sure you discuss any questions you have with your health care provider. Document Released: 10/03/2012 Document Revised: 03/24/2016 Document Reviewed: 09/10/2015 Elsevier Interactive Patient Education  2018 [place] Anesthesia is a term that refers to techniques, procedures, and medicines that help a person stay safe and comfortable during a medical procedure. Monitored anesthesia care, or sedation, is one type of anesthesia. Your anesthesia specialist may recommend sedation if you will be having a procedure that does not require you to be unconscious, such as:  Cataract surgery.  A  dental procedure.  A biopsy.  A colonoscopy.  During the procedure, you may receive a medicine to help you relax (sedative). There are three levels of sedation:  Mild sedation. At this level, you may feel awake and relaxed. You will be able to follow directions.  Moderate sedation. At this level, you will be sleepy. You may not remember the procedure.  Deep sedation. At this level, you will be asleep. You will not remember the procedure.  The more medicine you are given, the deeper your level of sedation will be. Depending on how you respond to the procedure, the anesthesia specialist may change your level of sedation or the type of anesthesia to fit your needs. An anesthesia specialist will monitor you closely during the procedure. Let your health care provider know about:  Any allergies you have.  All medicines you are taking, including vitamins, herbs, eye drops, creams, and over-the-counter medicines.  Any use of steroids (by mouth or as a cream).  Any problems you or family members have had with sedatives and anesthetic medicines.  Any blood disorders you have.  Any surgeries you have had.  Any medical conditions you have, such as sleep apnea.  Whether you are pregnant or may be pregnant.  Any use of cigarettes, alcohol, or street drugs. What are the risks? Generally, this is a safe procedure. However, problems may occur, including:  Getting too much medicine (oversedation).  Nausea.  Allergic reaction to medicines.  Trouble breathing. If this happens, a breathing tube may be used to help with breathing. It will be removed when you are awake and breathing on your own.  Heart trouble.  Lung trouble.  Before the procedure Staying hydrated Follow instructions from your health care provider about hydration, which may include:  Up to 2 hours before the procedure - you may continue to drink clear liquids, such as water, clear fruit juice, black coffee, and plain  tea.  Eating and drinking restrictions Follow instructions from your health care provider about eating and drinking, which may include:  8 hours before the procedure - stop eating heavy meals or foods such as meat, fried foods, or fatty foods.  6 hours before the procedure - stop eating light meals or foods, such as toast or cereal.  6 hours before the procedure - stop drinking milk or drinks that contain milk.  2 hours before the procedure - stop drinking clear liquids.  Medicines Ask your health care provider about:  Changing or stopping your regular medicines. This is especially important if you are taking diabetes medicines or blood thinners.  Taking medicines such as aspirin and ibuprofen. These  medicines can thin your blood. Do not take these medicines before your procedure if your health care provider instructs you not to.  Tests and exams  You will have a physical exam.  You may have blood tests done to show: ? How well your kidneys and liver are working. ? How well your blood can clot.  General instructions  Plan to have someone take you home from the hospital or clinic.  If you will be going home right after the procedure, plan to have someone with you for 24 hours.  What happens during the procedure?  Your blood pressure, heart rate, breathing, level of pain and overall condition will be monitored.  An IV tube will be inserted into one of your veins.  Your anesthesia specialist will give you medicines as needed to keep you comfortable during the procedure. This may mean changing the level of sedation.  The procedure will be performed. After the procedure  Your blood pressure, heart rate, breathing rate, and blood oxygen level will be monitored until the medicines you were given have worn off.  Do not drive for 24 hours if you received a sedative.  You may: ? Feel sleepy, clumsy, or nauseous. ? Feel forgetful about what happened after the  procedure. ? Have a sore throat if you had a breathing tube during the procedure. ? Vomit. This information is not intended to replace advice given to you by your health care provider. Make sure you discuss any questions you have with your health care provider. Document Released: 07/13/2005 Document Revised: 03/25/2016 Document Reviewed: 02/07/2016 Elsevier Interactive Patient Education  2018 [place], Care After These instructions provide you with information about caring for yourself after your procedure. Your health care provider may also give you more specific instructions. Your treatment has been planned according to current medical practices, but problems sometimes occur. Call your health care provider if you have any problems or questions after your procedure. What can I expect after the procedure? After your procedure, it is common to:  Feel sleepy for several hours.  Feel clumsy and have poor balance for several hours.  Feel forgetful about what happened after the procedure.  Have poor judgment for several hours.  Feel nauseous or vomit.  Have a sore throat if you had a breathing tube during the procedure.  Follow these instructions at home: For at least 24 hours after the procedure:   Do not: ? Participate in activities in which you could fall or become injured. ? Drive. ? Use heavy machinery. ? Drink alcohol. ? Take sleeping pills or medicines that cause drowsiness. ? Make important decisions or sign legal documents. ? Take care of children on your own.  Rest. Eating and drinking  Follow the diet that is recommended by your health care provider.  If you vomit, drink water, juice, or soup when you can drink without vomiting.  Make sure you have little or no nausea before eating solid foods. General instructions  Have a responsible adult stay with you until you are awake and alert.  Take over-the-counter and prescription  medicines only as told by your health care provider.  If you smoke, do not smoke without supervision.  Keep all follow-up visits as told by your health care provider. This is important. Contact a health care provider if:  You keep feeling nauseous or you keep vomiting.  You feel light-headed.  You develop a rash.  You have a fever. Get help right away  if:  You have trouble breathing. This information is not intended to replace advice given to you by your health care provider. Make sure you discuss any questions you have with your health care provider. Document Released: 02/07/2016 Document Revised: 06/08/2016 Document Reviewed: 02/07/2016 Elsevier Interactive Patient Education  2018 [place], Care After These instructions provide you with information about caring for yourself after your procedure. Your health care provider may also give you more specific instructions. Your treatment has been planned according to current medical practices, but problems sometimes occur. Call your health care provider if you have any problems or questions after your procedure. What can I expect after the procedure? After your procedure, it is common to:  Feel sleepy for several hours.  Feel clumsy and have poor balance for several hours.  Feel forgetful about what happened after the procedure.  Have poor judgment for several hours.  Feel nauseous or vomit.  Have a sore throat if you had a breathing tube during the procedure.  Follow these instructions at home: For at least 24 hours after the procedure:   Do not: ? Participate in activities in which you could fall or become injured. ? Drive. ? Use heavy machinery. ? Drink alcohol. ? Take sleeping pills or medicines that cause drowsiness. ? Make important decisions or sign legal documents. ? Take care of children on your own.  Rest. Eating and drinking  Follow the diet that is recommended by your health  care provider.  If you vomit, drink water, juice, or soup when you can drink without vomiting.  Make sure you have little or no nausea before eating solid foods. General instructions  Have a responsible adult stay with you until you are awake and alert.  Take over-the-counter and prescription medicines only as told by your health care provider.  If you smoke, do not smoke without supervision.  Keep all follow-up visits as told by your health care provider. This is important. Contact a health care provider if:  You keep feeling nauseous or you keep vomiting.  You feel light-headed.  You develop a rash.  You have a fever. Get help right away if:  You have trouble breathing. This information is not intended to replace advice given to you by your health care provider. Make sure you discuss any questions you have with your health care provider. Document Released: 02/07/2016 Document Revised: 06/08/2016 Document Reviewed: 02/07/2016 Elsevier Interactive Patient Education  [employer].

## 2018-04-24 ENCOUNTER — Other Ambulatory Visit: Payer: Self-pay

## 2018-04-24 ENCOUNTER — Encounter (HOSPITAL_COMMUNITY): Payer: Self-pay

## 2018-04-24 ENCOUNTER — Encounter (HOSPITAL_COMMUNITY)
Admission: RE | Admit: 2018-04-24 | Discharge: 2018-04-24 | Disposition: A | Discharge status: Home / Self Care | Payer: Medicare Other | Source: Ambulatory Visit | Attending: Gastroenterology | Admitting: Gastroenterology

## 2018-04-24 ENCOUNTER — Inpatient Hospital Stay (HOSPITAL_COMMUNITY): Admission: RE | Admit: 2018-04-24 | Payer: Medicare Other | Source: Ambulatory Visit

## 2018-04-24 DIAGNOSIS — Z01812 Encounter for preprocedural laboratory examination: Secondary | ICD-10-CM | POA: Diagnosis not present

## 2018-04-24 DIAGNOSIS — M545 Low back pain: Secondary | ICD-10-CM | POA: Diagnosis not present

## 2018-04-24 DIAGNOSIS — F329 Major depressive disorder, single episode, unspecified: Secondary | ICD-10-CM | POA: Insufficient documentation

## 2018-04-24 DIAGNOSIS — I10 Essential (primary) hypertension: Secondary | ICD-10-CM | POA: Diagnosis not present

## 2018-04-24 DIAGNOSIS — G8929 Other chronic pain: Secondary | ICD-10-CM | POA: Insufficient documentation

## 2018-04-24 DIAGNOSIS — R55 Syncope and collapse: Secondary | ICD-10-CM | POA: Insufficient documentation

## 2018-04-24 DIAGNOSIS — E119 Type 2 diabetes mellitus without complications: Secondary | ICD-10-CM | POA: Diagnosis not present

## 2018-04-24 LAB — CBC WITH DIFFERENTIAL/PLATELET
BASOS ABS: 0.1 10*3/uL (ref 0.0–0.1)
BASOS PCT: 1 %
EOS ABS: 0.3 10*3/uL (ref 0.0–0.7)
EOS PCT: 2 %
HCT: 44.2 % (ref 36.0–46.0)
Hemoglobin: 14.6 g/dL (ref 12.0–15.0)
LYMPHS PCT: 29 %
Lymphs Abs: 3.6 10*3/uL (ref 0.7–4.0)
MCH: 31.9 pg (ref 26.0–34.0)
MCHC: 33 g/dL (ref 30.0–36.0)
MCV: 96.5 fL (ref 78.0–100.0)
MONO ABS: 0.7 10*3/uL (ref 0.1–1.0)
Monocytes Relative: 6 %
Neutro Abs: 7.8 10*3/uL — ABNORMAL HIGH (ref 1.7–7.7)
Neutrophils Relative %: 63 %
PLATELETS: 254 10*3/uL (ref 150–400)
RBC: 4.58 MIL/uL (ref 3.87–5.11)
RDW: 13.3 % (ref 11.5–15.5)
WBC: 12.5 10*3/uL — AB (ref 4.0–10.5)

## 2018-04-24 LAB — BASIC METABOLIC PANEL
ANION GAP: 8 (ref 5–15)
BUN: 19 mg/dL (ref 6–20)
CALCIUM: 8.9 mg/dL (ref 8.9–10.3)
CO2: 22 mmol/L (ref 22–32)
Chloride: 112 mmol/L — ABNORMAL HIGH (ref 98–111)
Creatinine, Ser: 1.04 mg/dL — ABNORMAL HIGH (ref 0.44–1.00)
GFR, EST NON AFRICAN AMERICAN: 58 mL/min — AB (ref >60)
Glucose, Bld: 145 mg/dL — ABNORMAL HIGH (ref 70–99)
POTASSIUM: 4.5 mmol/L (ref 3.5–5.1)
SODIUM: 142 mmol/L (ref 135–145)
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
## 2018-04-26 ENCOUNTER — Ambulatory Visit (INDEPENDENT_AMBULATORY_CARE_PROVIDER_SITE_OTHER): Payer: Medicare Other | Admitting: Psychiatry

## 2018-04-26 ENCOUNTER — Encounter (HOSPITAL_COMMUNITY): Payer: Self-pay | Admitting: Psychiatry

## 2018-04-26 VITALS — BP 113/78 | HR 74 | Ht 61.5 in | Wt 179.0 lb

## 2018-04-26 DIAGNOSIS — F331 Major depressive disorder, recurrent, moderate: Secondary | ICD-10-CM

## 2018-04-26 DIAGNOSIS — F4310 Post-traumatic stress disorder, unspecified: Secondary | ICD-10-CM | POA: Diagnosis not present

## 2018-04-26 MED ORDER — DULOXETINE HCL 30 MG PO CPEP: ORAL_CAPSULE | ORAL | 0 refills | Status: DC

## 2018-04-26 MED ORDER — ALPRAZOLAM 0.25 MG PO TABS: 0.2500 mg | ORAL_TABLET | Freq: Two times a day (BID) | ORAL | 0 refills | Status: DC | PRN

## 2018-04-26 NOTE — Patient Instructions (Addendum)
1. Increase duloxetine 90 mg daily (60 mg + 30 mg) 2. Continue Xanax 0.25 mg twice a day for anxiety  - Discuss sleep study with your primary care  - Check with your primary care if you checked thyroid (TSH)

## 2018-04-27 LAB — DRUGS OF ABUSE SCREEN W/O ALC, ROUTINE URINE
AMPHETAMINES (1000 ng/mL SCRN): NEGATIVE
BARBITURATES: NEGATIVE
BENZODIAZEPINES: NEGATIVE
COCAINE METABOLITES: NEGATIVE
MARIJUANA MET (50 ng/mL SCRN): NEGATIVE
METHADONE: NEGATIVE
METHAQUALONE: NEGATIVE
OPIATES: NEGATIVE
PHENCYCLIDINE: NEGATIVE
PROPOXYPHENE: NEGATIVE

## 2018-05-01 ENCOUNTER — Encounter (HOSPITAL_COMMUNITY)
Admission: RE | Disposition: A | Discharge status: Home / Self Care | Payer: Self-pay | Source: Ambulatory Visit | Attending: Gastroenterology

## 2018-05-01 ENCOUNTER — Ambulatory Visit (HOSPITAL_COMMUNITY): Payer: Medicare Other | Admitting: Anesthesiology

## 2018-05-01 ENCOUNTER — Ambulatory Visit (HOSPITAL_COMMUNITY)
Admission: RE | Admit: 2018-05-01 | Discharge: 2018-05-01 | Disposition: A | Discharge status: Home / Self Care | Payer: Medicare Other | Source: Ambulatory Visit | Attending: Gastroenterology | Admitting: Gastroenterology

## 2018-05-01 ENCOUNTER — Encounter (HOSPITAL_COMMUNITY): Payer: Self-pay | Admitting: *Deleted

## 2018-05-01 DIAGNOSIS — K2970 Gastritis, unspecified, without bleeding: Secondary | ICD-10-CM

## 2018-05-01 DIAGNOSIS — E119 Type 2 diabetes mellitus without complications: Secondary | ICD-10-CM | POA: Insufficient documentation

## 2018-05-01 DIAGNOSIS — K449 Diaphragmatic hernia without obstruction or gangrene: Secondary | ICD-10-CM | POA: Insufficient documentation

## 2018-05-01 DIAGNOSIS — F17210 Nicotine dependence, cigarettes, uncomplicated: Secondary | ICD-10-CM | POA: Insufficient documentation

## 2018-05-01 DIAGNOSIS — Z888 Allergy status to other drugs, medicaments and biological substances status: Secondary | ICD-10-CM | POA: Diagnosis not present

## 2018-05-01 DIAGNOSIS — Z800 Family history of malignant neoplasm of digestive organs: Secondary | ICD-10-CM | POA: Insufficient documentation

## 2018-05-01 DIAGNOSIS — I10 Essential (primary) hypertension: Secondary | ICD-10-CM | POA: Insufficient documentation

## 2018-05-01 DIAGNOSIS — Z79899 Other long term (current) drug therapy: Secondary | ICD-10-CM | POA: Insufficient documentation

## 2018-05-01 DIAGNOSIS — F319 Bipolar disorder, unspecified: Secondary | ICD-10-CM | POA: Insufficient documentation

## 2018-05-01 DIAGNOSIS — F410 Panic disorder [episodic paroxysmal anxiety] without agoraphobia: Secondary | ICD-10-CM | POA: Diagnosis not present

## 2018-05-01 DIAGNOSIS — Z7984 Long term (current) use of oral hypoglycemic drugs: Secondary | ICD-10-CM | POA: Diagnosis not present

## 2018-05-01 DIAGNOSIS — R1013 Epigastric pain: Secondary | ICD-10-CM | POA: Diagnosis not present

## 2018-05-01 DIAGNOSIS — F419 Anxiety disorder, unspecified: Secondary | ICD-10-CM | POA: Diagnosis not present

## 2018-05-01 DIAGNOSIS — Z7982 Long term (current) use of aspirin: Secondary | ICD-10-CM | POA: Diagnosis not present

## 2018-05-01 DIAGNOSIS — K219 Gastro-esophageal reflux disease without esophagitis: Secondary | ICD-10-CM | POA: Diagnosis present

## 2018-05-01 DIAGNOSIS — R112 Nausea with vomiting, unspecified: Secondary | ICD-10-CM

## 2018-05-01 DIAGNOSIS — K3189 Other diseases of stomach and duodenum: Secondary | ICD-10-CM | POA: Diagnosis not present

## 2018-05-01 HISTORY — PX: BIOPSY: SHX5522

## 2018-05-01 HISTORY — PX: ESOPHAGOGASTRODUODENOSCOPY (EGD) WITH PROPOFOL: SHX5813

## 2018-05-01 LAB — GLUCOSE, CAPILLARY
GLUCOSE-CAPILLARY: 157 mg/dL — AB (ref 70–99)
Glucose-Capillary: 122 mg/dL — ABNORMAL HIGH (ref 70–99)

## 2018-05-01 SURGERY — ESOPHAGOGASTRODUODENOSCOPY (EGD) WITH PROPOFOL
Anesthesia: Monitor Anesthesia Care

## 2018-05-01 MED ORDER — PROPOFOL 500 MG/50ML IV EMUL
INTRAVENOUS | Status: DC | PRN
  Administered 2018-05-01: 135 ug/kg/min via INTRAVENOUS

## 2018-05-01 MED ORDER — LACTATED RINGERS IV SOLN
INTRAVENOUS | Status: DC
  Administered 2018-05-01: 1000 mL via INTRAVENOUS

## 2018-05-01 MED ORDER — CHLORHEXIDINE GLUCONATE CLOTH 2 % EX PADS: 6.0000 | MEDICATED_PAD | Freq: Once | CUTANEOUS | Status: DC

## 2018-05-01 MED ORDER — HYDROMORPHONE HCL 1 MG/ML IJ SOLN: 0.2500 mg | INTRAMUSCULAR | Status: DC | PRN

## 2018-05-01 MED ORDER — PROPOFOL 10 MG/ML IV BOLUS
INTRAVENOUS | Status: AC
  Filled 2018-05-01: qty 40

## 2018-05-01 MED ORDER — MIDAZOLAM HCL 2 MG/2ML IJ SOLN
INTRAMUSCULAR | Status: AC
  Filled 2018-05-01: qty 2

## 2018-05-01 MED ORDER — LACTATED RINGERS IV SOLN: INTRAVENOUS | Status: DC

## 2018-05-01 MED ORDER — MIDAZOLAM HCL 5 MG/5ML IJ SOLN
INTRAMUSCULAR | Status: DC | PRN
  Administered 2018-05-01: 2 mg via INTRAVENOUS

## 2018-05-01 MED ORDER — MEPERIDINE HCL 100 MG/ML IJ SOLN: 6.2500 mg | INTRAMUSCULAR | Status: DC | PRN

## 2018-05-01 MED ORDER — DEXLANSOPRAZOLE 60 MG PO CPDR: DELAYED_RELEASE_CAPSULE | ORAL | 11 refills | Status: DC

## 2018-05-01 MED ORDER — PROMETHAZINE HCL 25 MG/ML IJ SOLN: 6.2500 mg | INTRAMUSCULAR | Status: DC | PRN

## 2018-05-01 MED ORDER — PROPOFOL 10 MG/ML IV BOLUS
INTRAVENOUS | Status: DC | PRN
  Administered 2018-05-01 (×2): 10 mg via INTRAVENOUS

## 2018-05-01 MED ORDER — HYDROCODONE-ACETAMINOPHEN 7.5-325 MG PO TABS: 1.0000 | ORAL_TABLET | Freq: Once | ORAL | Status: DC | PRN

## 2018-05-01 MED ORDER — LIDOCAINE VISCOUS HCL 2 % MT SOLN
OROMUCOSAL | Status: AC
  Filled 2018-05-01: qty 15

## 2018-05-01 NOTE — Transfer of Care (Signed)
Immediate Anesthesia Transfer of Care Note  Patient: Katelyn Kelly  Procedure(s) Performed: ESOPHAGOGASTRODUODENOSCOPY (EGD) WITH PROPOFOL (N/A ) BIOPSY  Patient Location: PACU  Anesthesia Type:MAC  Level of Consciousness: drowsy and patient cooperative  Airway & Oxygen Therapy: Patient Spontanous Breathing  Post-op Assessment: Report given to RN, Post -op Vital signs reviewed and stable and Patient moving all extremities  Post vital signs: Reviewed and stable  Last Vitals:  Vitals Value Taken Time  BP    Temp    Pulse 75 05/01/2018 10:24 AM  Resp    SpO2 94 % 05/01/2018 10:24 AM  Vitals shown include unvalidated device data.  Last Pain:  Vitals:   05/01/18 0818  TempSrc: Oral  PainSc: 4       Patients Stated Pain Goal: 7 (11/91/47 [id number])  Complications: No apparent anesthesia complications

## 2018-05-01 NOTE — H&P (Signed)
Primary Care Physician:  Denyce Robert, FNP Primary Gastroenterologist:  Dr. Oneida Alar  Pre-Procedure History & Physical: HPI:  Katelyn Kelly is a 57 y.o. female here for DYSPEPSIA.  Past Medical History:  Diagnosis Date  . Anxiety and depression   . Bilateral carpal tunnel syndrome   . Chronic back pain   . Chronic neck pain   . Depression   . Diabetes mellitus   . H/O syncope   . Headache   . Hypertension   . Manic depression ([place])   . Panic attack     Past Surgical History:  Procedure Laterality Date  . ABDOMINAL HYSTERECTOMY    . ABDOMINAL SURGERY    . APPENDECTOMY    . BIOPSY  09/05/2017   Procedure: BIOPSY;  Surgeon: [name], MD;  [facility];  Service: Endoscopy;;  random colon  . CARPAL TUNNEL RELEASE Bilateral   . CHOLECYSTECTOMY    . COLONOSCOPY WITH PROPOFOL N/A 09/05/2017   Procedure: COLONOSCOPY WITH PROPOFOL;  Surgeon: [name], MD;  [facility];  Service: Endoscopy;  Laterality: N/A;  10:45AM  . DILATION AND CURETTAGE OF UTERUS    . POLYPECTOMY  09/05/2017   Procedure: POLYPECTOMY;  Surgeon: [name], MD;  [facility];  Service: Endoscopy;;  sigmoid and rectal  . TUBAL LIGATION      Prior to Admission medications   Medication Sig Start Date End Date Taking? Authorizing Provider  acetaminophen (TYLENOL) 500 MG tablet Take 1,500 mg by mouth every 6 (six) hours as needed for mild pain or moderate pain.    Yes [provider]  ALPRAZolam (XANAX) 0.25 MG tablet Take 1 tablet (0.25 mg total) by mouth 2 (two) times daily as needed. 05/09/18  Yes [name], MD  aspirin EC 81 MG tablet Take 81 mg by mouth daily.   Yes [provider]  atorvastatin (LIPITOR) 10 MG tablet Take 10 mg by mouth daily.   Yes [provider]  DULoxetine (CYMBALTA) 60 MG capsule Take 60 mg by mouth at bedtime.    Yes [provider]  gabapentin (NEURONTIN) 100 MG capsule Take 100 mg by mouth  3 (three) times daily.   Yes [provider]  Hyprom-Naphaz-Polysorb-Zn Sulf (CLEAR EYES COMPLETE OP) Apply 1-2 drops to eye 2 (two) times daily as needed (dryness).    Yes [provider]  ibuprofen (ADVIL,MOTRIN) 200 MG tablet Take 600 mg by mouth every 8 (eight) hours as needed for headache or mild pain.   Yes [provider]  metFORMIN (GLUCOPHAGE) 500 MG tablet Take 1,000 mg by mouth 2 (two) times daily with a meal.    Yes [provider]  ondansetron (ZOFRAN) 4 MG tablet Take 4 mg by mouth every 8 (eight) hours as needed for nausea or vomiting.   Yes [provider]  topiramate (TOPAMAX) 25 MG tablet Take 50-75 mg by mouth See admin instructions. Takes 25 mg tablets    2 in the Am and 3 in the pm   Yes [provider]  DULoxetine (CYMBALTA) 30 MG capsule Take total of 90 mg daily (60 mg + 30 mg) 04/26/18   [name], MD    Allergies as of 02/14/2018 - Review Complete 02/14/2018  Allergen Reaction Noted  . Sinequan [doxepin hcl] Anaphylaxis 01/10/2012  . Methylphenidate derivatives Hives 07/28/2013  . Tranxene [clorazepate dipotassium] Hives and Swelling 01/10/2012    Family History  Problem Relation Age of Onset  .  Diabetes Mother   . Stroke Mother   . Depression Mother   . Colon cancer Father 46       Passed away from colon cancer  . Colon cancer Brother 90       Passed away from colon cancer  . Bipolar disorder Son     Social History   Socioeconomic History  . Marital status: Legally Separated    Spouse name: Not on file  . Number of children: Not on file  . Years of education: Not on file  . Highest education level: Not on file  Occupational History  . Not on file  Social Needs  . Financial resource strain: Not on file  . Food insecurity:    Worry: Not on file    Inability: Not on file  . Transportation needs:    Medical: Not on file    Non-medical: Not on file  Tobacco Use  . Smoking status: Current  Every Day Smoker    Packs/day: 1.50    Years: 40.00    Pack years: 60.00    Types: Cigarettes  . Smokeless tobacco: Never Used  . Tobacco comment: one pack a day  Substance and Sexual Activity  . Alcohol use: No  . Drug use: No  . Sexual activity: Not Currently    Birth control/protection: Surgical  Lifestyle  . Physical activity:    Days per week: Not on file    Minutes per session: Not on file  . Stress: Not on file  Relationships  . Social connections:    Talks on phone: Not on file    Gets together: Not on file    Attends religious service: Not on file    Active member of club or organization: Not on file    Attends meetings of clubs or organizations: Not on file    Relationship status: Not on file  . Intimate partner violence:    Fear of current or ex partner: Not on file    Emotionally abused: Not on file    Physically abused: Not on file    Forced sexual activity: Not on file  Other Topics Concern  . Not on file  Social History Narrative  . Not on file    Review of Systems: See HPI, otherwise negative ROS   Physical Exam: BP 124/75   Pulse 93   Temp 98.7 F (37.1 C) (Oral)   Resp 20   SpO2 95%  General:   Alert,  pleasant and cooperative in NAD Head:  Normocephalic and atraumatic. Neck:  Supple; Lungs:  Clear throughout to auscultation.    Heart:  Regular rate and rhythm. Abdomen:  Soft, nontender and nondistended. Normal bowel sounds, without guarding, and without rebound.   Neurologic:  Alert and  oriented x4;  grossly normal neurologically.  Impression/Plan:     DYSPEPSIA  PLAN:  EGD TODAY DISCUSSED PROCEDURE, BENEFITS, & RISKS: < 1% chance of medication reaction, bleeding, OR perforation.

## 2018-05-01 NOTE — Anesthesia Preprocedure Evaluation (Signed)
Anesthesia Evaluation  Patient identified by MRN, date of birth, ID band Patient awake    Reviewed: Allergy & Precautions, H&P , NPO status , Patient's Chart, lab work & pertinent test results, reviewed documented beta blocker date and time   Airway Mallampati: II  TM Distance: >3 FB Neck ROM: full    Dental no notable dental hx. (+) Edentulous Upper, Edentulous Lower   Pulmonary neg pulmonary ROS, shortness of breath and with exertion, Current Smoker,    Pulmonary exam normal breath sounds clear to auscultation + decreased breath sounds      Cardiovascular Exercise Tolerance: Good hypertension, On Medications negative cardio ROS   Rhythm:regular Rate:Normal     Neuro/Psych  Headaches, PSYCHIATRIC DISORDERS Anxiety Depression Bipolar Disorder  Neuromuscular disease negative neurological ROS  negative psych ROS   GI/Hepatic negative GI ROS, Neg liver ROS, GERD  ,  Endo/Other  negative endocrine ROSdiabetes, Type 2  Renal/GU negative Renal ROS  negative genitourinary   Musculoskeletal   Abdominal   Peds  Hematology negative hematology ROS (+)   Anesthesia Other Findings Tobacco abuse 2 ppd 2-plus yr h/o of neuropathy b/l LEs  Reproductive/Obstetrics negative OB ROS                             Anesthesia Physical Anesthesia Plan  ASA: III  Anesthesia Plan: MAC   Post-op Pain Management:    Induction:   PONV Risk Score and Plan:   Airway Management Planned:   Additional Equipment:   Intra-op Plan:   Post-operative Plan:   Informed Consent: I have reviewed the patients History and Physical, chart, labs and discussed the procedure including the risks, benefits and alternatives for the proposed anesthesia with the patient or authorized representative who has indicated his/her understanding and acceptance.   Dental Advisory Given  Plan Discussed with: CRNA and  Anesthesiologist  Anesthesia Plan Comments:         Anesthesia Quick Evaluation

## 2018-05-01 NOTE — Anesthesia Postprocedure Evaluation (Signed)
Anesthesia Post Note  Patient: Katelyn Kelly  Procedure(s) Performed: ESOPHAGOGASTRODUODENOSCOPY (EGD) WITH PROPOFOL (N/A ) BIOPSY  Patient location during evaluation: PACU Anesthesia Type: MAC Level of consciousness: awake and patient cooperative Pain management: pain level controlled Vital Signs Assessment: post-procedure vital signs reviewed and stable Respiratory status: spontaneous breathing, nonlabored ventilation and respiratory function stable Cardiovascular status: blood pressure returned to baseline Postop Assessment: no apparent nausea or vomiting Anesthetic complications: no     Last Vitals:  Vitals:   05/01/18 0818  BP: 124/75  Pulse: 93  Resp: 20  Temp: 37.1 C  SpO2: 95%    Last Pain:  Vitals:   05/01/18 0818  TempSrc: Oral  PainSc: 4                  Jari Carollo J

## 2018-05-01 NOTE — Discharge Instructions (Signed)
YOUR TROUBLE SWALLOWING, NAUSEA/VOMITING, AND BURNING CHEST PAIN ARE DUE TO gastritis and reflux. YOU HAVE A SMALL HIATAL HERNIA. I biopsied your stomach.   DRINK WATER TO KEEP YOUR URINE LIGHT YELLOW.  CONTINUE YOUR WEIGHT LOSS EFFORTS. GET YOUR BODY MASS INDEX UNDER 30. A WEIGHT OF 160 LBS OR LESS   WILL GET YOUR BODY MASS INDEX(BMI) UNDER 30.  AVOID REFLUX TRIGGERS. SEE INFO BELOW.  STRICTLY FOLLOW A LOW FAT DIET. MEATS SHOULD BE BAKED, BROILED, OR BOILED. AVOID FRIED FOODS. SEE INFO BELOW.   CONTINUE DEXILANT.  YOUR BIOPSY WILL BE BACK IN 7 DAYS.   FOLLOW UP IN NOV 2019.   UPPER ENDOSCOPY AFTER CARE Read the instructions outlined below and refer to this sheet in the next week. These discharge instructions provide you with general information on caring for yourself after you leave the hospital. While your treatment has been planned according to the most current medical practices available, unavoidable complications occasionally occur. If you have any problems or questions after discharge, call DR. FIELDS, [phone].  ACTIVITY  You may resume your regular activity, but move at a slower pace for the next 24 hours.   Take frequent rest periods for the next 24 hours.   Walking will help get rid of the air and reduce the bloated feeling in your belly (abdomen).   No driving for 24 hours (because of the medicine (anesthesia) used during the test).   You may shower.   Do not sign any important legal documents or operate any machinery for 24 hours (because of the anesthesia used during the test).    NUTRITION  Drink plenty of fluids.   You may resume your normal diet as instructed by your doctor.   Begin with a light meal and progress to your normal diet. Heavy or fried foods are harder to digest and may make you feel sick to your stomach (nauseated).   Avoid alcoholic beverages for 24 hours or as instructed.    MEDICATIONS  You may resume your normal  medications.   WHAT YOU CAN EXPECT TODAY  Some feelings of bloating in the abdomen.   Passage of more gas than usual.    IF YOU HAD A BIOPSY TAKEN DURING THE UPPER ENDOSCOPY:  Eat a soft diet IF YOU HAVE NAUSEA, BLOATING, ABDOMINAL PAIN, OR VOMITING.    FINDING OUT THE RESULTS OF YOUR TEST Not all test results are available during your visit. DR. Oneida Alar WILL CALL YOU WITHIN 7 DAYS OF YOUR PROCEDUE WITH YOUR RESULTS. Do not assume everything is normal if you have not heard from DR. FIELDS IN ONE WEEK, CALL HER OFFICE AT [phone].  SEEK IMMEDIATE MEDICAL ATTENTION AND CALL THE OFFICE: [phone] IF:  You have more than a spotting of blood in your stool.   Your belly is swollen (abdominal distention).   You are nauseated or vomiting.   You have a temperature over 101F.   You have abdominal pain or discomfort that is severe or gets worse throughout the day.  Lifestyle and home remedies TO CONTROL HEARTBURN/REFLUX  You may eliminate or reduce the frequency of heartburn by making the following lifestyle changes:   Control your weight. Being overweight is a major risk factor for heartburn and GERD. Excess pounds put pressure on your abdomen, pushing up your stomach and causing acid to back up into your esophagus.    Eat smaller meals. 4 TO 6 MEALS A DAY. This reduces pressure on the lower esophageal sphincter, helping to  prevent the valve from opening and acid from washing back into your esophagus.    Loosen your belt. Clothes that fit tightly around your waist put pressure on your abdomen and the lower esophageal sphincter.    Eliminate heartburn triggers. Everyone has specific triggers. Common triggers such as fatty or fried foods, spicy food, tomato sauce, carbonated beverages, alcohol, chocolate, mint, garlic, onion, caffeine and nicotine may make heartburn worse.    Avoid stooping or bending. Tying your shoes is OK. Bending over for longer periods to weed your  garden isn't, especially soon after eating.    Don't lie down after a meal. Wait at least three to four hours after eating before going to bed, and don't lie down right after eating.   Alternative medicine  Several home remedies exist for treating GERD, but they provide only temporary relief. They include drinking baking soda (sodium bicarbonate) added to water or drinking other fluids such as baking soda mixed with cream of tartar and water.  Although these liquids create temporary relief by neutralizing, washing away or buffering acids, eventually they aggravate the situation by adding gas and fluid to your stomach, increasing pressure and causing more acid reflux. Further, adding more sodium to your diet may increase your blood pressure and add stress to your heart, and excessive bicarbonate ingestion can alter the acid-base balance in your body.    Gastritis  Gastritis is an inflammation (the body's way of reacting to injury and/or infection) of the stomach. It is often caused by bacterial (germ) infections. It can also be caused BY ASPIRIN, BC/GOODY POWDER'S, (IBUPROFEN) MOTRIN, OR ALEVE (NAPROXEN), chemicals (including alcohol), SPICY FOODS, and medications. This illness may be associated with generalized malaise (feeling tired, not well), UPPER ABDOMINAL STOMACH cramps, and fever. One common bacterial cause of gastritis is an organism known as H. Pylori. This can be treated with antibiotics.   REFLUX   TREATMENT There are a number of medicines used to treat reflux including: Antacids.  ZANTAC Proton-pump inhibitors: PRILOSEC (OMEPRAZOLE) OR DEXILANT    Low-Fat Diet BREADS, CEREALS, PASTA, RICE, DRIED PEAS, AND BEANS These products are high in carbohydrates and most are low in fat. Therefore, they can be increased in the diet as substitutes for fatty foods. They too, however, contain calories and should not be eaten in excess. Cereals can be eaten for snacks as well as for  breakfast.  Include foods that contain fiber (fruits, vegetables, whole grains, and legumes). Research shows that fiber may lower blood cholesterol levels, especially the water-soluble fiber found in fruits, vegetables, oat products, and legumes. FRUITS AND VEGETABLES It is good to eat fruits and vegetables. Besides being sources of fiber, both are rich in vitamins and some minerals. They help you get the daily allowances of these nutrients. Fruits and vegetables can be used for snacks and desserts. MEATS Limit lean meat, chicken, [place], and fish to no more than 6 ounces per day. Beef, Pork, and Lamb Use lean cuts of beef, pork, and lamb. Lean cuts include:  Extra-lean ground beef.  Arm roast.  Sirloin tip.  Center-cut ham.  Round steak.  Loin chops.  Rump roast.  Tenderloin.  Trim all fat off the outside of meats before cooking. It is not necessary to severely decrease the intake of red meat, but lean choices should be made. Lean meat is rich in protein and contains a highly absorbable form of iron. Premenopausal women, in particular, should avoid reducing lean red meat because this could increase the  risk for low red blood cells (iron-deficiency anemia).  Chicken and [place] These are good sources of protein. The fat of poultry can be reduced by removing the skin and underlying fat layers before cooking. Chicken and [place] can be substituted for lean red meat in the diet. Poultry should not be fried or covered with high-fat sauces. Fish and Shellfish Fish is a good source of protein. Shellfish contain cholesterol, but they usually are low in saturated fatty acids. The preparation of fish is important. Like chicken and [place], they should not be fried or covered with high-fat sauces. EGGS Egg whites contain no fat or cholesterol. They can be eaten often. Try 1 to 2 egg whites instead of whole eggs in recipes or use egg substitutes that do not contain yolk.  MILK AND DAIRY PRODUCTS Use skim  or 1% milk instead of 2% or whole milk. Decrease whole milk, natural, and processed cheeses. Use nonfat or low-fat (2%) cottage cheese or low-fat cheeses made from vegetable oils. Choose nonfat or low-fat (1 to 2%) yogurt. Experiment with evaporated skim milk in recipes that call for heavy cream. Substitute low-fat yogurt or low-fat cottage cheese for sour cream in dips and salad dressings. Have at least 2 servings of low-fat dairy products, such as 2 glasses of skim (or 1%) milk each day to help get your daily calcium intake.  FATS AND OILS Butterfat, lard, and beef fats are high in saturated fat and cholesterol. These should be avoided.Vegetable fats do not contain cholesterol. AVOID coconut oil, palm oil, and palm kernel oil, WHICH are very high in saturated fats. These should be limited. These fats are often used in bakery goods, processed foods, popcorn, oils, and nondairy creamers. Vegetable shortenings and some peanut butters contain hydrogenated oils, which are also saturated fats. Read the labels on these foods and check for saturated vegetable oils.  Desirable liquid vegetable oils are corn oil, cottonseed oil, olive oil, canola oil, safflower oil, soybean oil, and sunflower oil. Peanut oil is not as good, but small amounts are acceptable. Buy a heart-healthy tub margarine that has no partially hydrogenated oils in the ingredients. AVOID Mayonnaise and salad dressings often are made from unsaturated fats.  OTHER EATING TIPS Snacks  Most sweets should be limited as snacks. They tend to be rich in calories and fats, and their caloric content outweighs their nutritional value. Some good choices in snacks are graham crackers, melba toast, soda crackers, bagels (no egg), English muffins, fruits, and vegetables. These snacks are preferable to snack crackers, [place] fries, and chips. Popcorn should be air-popped or cooked in small amounts of liquid vegetable oil.  Desserts Eat fruit, low-fat yogurt,  and fruit ices instead of pastries, cake, and cookies. Sherbet, angel food cake, gelatin dessert, frozen low-fat yogurt, or other frozen products that do not contain saturated fat (pure fruit juice bars, frozen ice pops) are also acceptable.   COOKING METHODS Choose those methods that use little or no fat. They include: Poaching.  Braising.  Steaming.  Grilling.  Baking.  Stir-frying.  Broiling.  Microwaving.  Foods can be cooked in a nonstick pan without added fat, or use a nonfat cooking spray in regular cookware. Limit fried foods and avoid frying in saturated fat. Add moisture to lean meats by using water, broth, cooking wines, and other nonfat or low-fat sauces along with the cooking methods mentioned above. Soups and stews should be chilled after cooking. The fat that forms on top after a few hours in the  refrigerator should be skimmed off. When preparing meals, avoid using excess salt. Salt can contribute to raising blood pressure in some people.  EATING AWAY FROM HOME Order entres, potatoes, and vegetables without sauces or butter. When meat exceeds the size of a deck of cards (3 to 4 ounces), the rest can be taken home for another meal. Choose vegetable or fruit salads and ask for low-calorie salad dressings to be served on the side. Use dressings sparingly. Limit high-fat toppings, such as bacon, crumbled eggs, cheese, sunflower seeds, and olives. Ask for heart-healthy tub margarine instead of butter.  Hiatal Hernia A hiatal hernia occurs when a part of the stomach slides above the diaphragm. The diaphragm is the thin muscle separating the belly (abdomen) from the chest. A hiatal hernia can be something you are born with or develop over time. Hiatal hernias may allow stomach acid to flow back into your esophagus, the tube which carries food from your mouth to your stomach. If this acid causes problems it is called GERD (gastro-esophageal reflux disease).

## 2018-05-01 NOTE — Op Note (Signed)
[facility] Patient Name: Katelyn Kelly Procedure Date: 05/01/2018 9:37 AM MRN: [id number] Date of Birth: [birth year] Attending MD: Barney Drain MD, MD CSN: [id number] Age: 57 Admit Type: Outpatient Procedure:                Upper GI endoscopy with cold forceps biopsy Indications:              Dyspepsia IN a PT WITH GERD OFF PPI AND BMI > 30.                            [place]. Providers:                Barney Drain MD, MD, Jeanann Lewandowsky. Sharon Seller, RN, Aram Candela Referring MD:             Denyce Robert, FNP Medicines:                Propofol per Anesthesia Complications:            No immediate complications. Estimated Blood Loss:     Estimated blood loss was minimal. Procedure:                Pre-Anesthesia Assessment:                           - Prior to the procedure, a History and Physical                            was performed, and patient medications and                            allergies were reviewed. The patient's tolerance of                            previous anesthesia was also reviewed. The risks                            and benefits of the procedure and the sedation                            options and risks were discussed with the patient.                            All questions were answered, and informed consent                            was obtained. Prior Anticoagulants: The patient                            last took aspirin 7 days and ibuprofen 7 days prior                            to the procedure. ASA Grade Assessment: II - A  patient with mild systemic disease. After reviewing                            the risks and benefits, the patient was deemed in                            satisfactory condition to undergo the procedure.                            After obtaining informed consent, the endoscope was                            passed under direct vision.  Throughout the                            procedure, the patient's blood pressure, pulse, and                            oxygen saturations were monitored continuously. The                            EG-[id number] ([id number]) scope was introduced through the                            mouth, and advanced to the second part of duodenum.                            The upper GI endoscopy was accomplished without                            difficulty. The patient tolerated the procedure                            well. Scope In: 10:08:45 AM Scope Out: 10:16:39 AM Total Procedure Duration: 0 hours 7 minutes 54 seconds  Findings:      The examined esophagus was normal.      Patchy mild inflammation characterized by congestion (edema), erosions       and erythema was found in the gastric body, on the greater curvature of       the stomach and in the gastric antrum. Biopsies were taken with a cold       forceps for Helicobacter pylori testing.      The examined duodenum was normal. Impression:               - SMALL HIATAL HERNIA                           - DYSPEPSIA DUE TO GERD/MILD Gastritis. Biopsied. Moderate Sedation:      Per Anesthesia Care Recommendation:           - Await pathology results.                           - Low fat diet. AVOID REFLUX TRIGGERS.LOSE WEIGHT  TO BMI < 30.                           - Continue present medications.                           - Return to my office in 6 months.                           - Patient has a contact number available for                            emergencies. The signs and symptoms of potential                            delayed complications were discussed with the                            patient. Return to normal activities tomorrow.                            Written discharge instructions were provided to the                            patient. Procedure Code(s):        --- Professional ---                            [phone], Esophagogastroduodenoscopy, flexible,                            transoral; with biopsy, single or multiple Diagnosis Code(s):        --- Professional ---                           K29.70, Gastritis, unspecified, without bleeding                           R10.13, Epigastric pain CPT copyright 2017 [employer]. All rights reserved. The codes documented in this report are preliminary and upon coder review may  be revised to meet current compliance requirements. Barney Drain, MD Barney Drain MD, MD 05/01/2018 10:26:41 AM This report has been signed electronically. Number of Addenda: 0

## 2018-05-03 ENCOUNTER — Telehealth: Payer: Self-pay | Admitting: Gastroenterology

## 2018-05-03 NOTE — Telephone Encounter (Addendum)
Please call pt. HER stomach BIOPSIES shows gastritis.    DRINK WATER TO KEEP YOUR URINE LIGHT YELLOW.  CONTINUE YOUR WEIGHT LOSS EFFORTS. GET YOUR BODY MASS INDEX UNDER 30. A WEIGHT OF 160 LBS OR LESS   WILL GET YOUR BODY MASS INDEX(BMI) UNDER 30.  AVOID REFLUX TRIGGERS.   STRICTLY FOLLOW A LOW FAT DIET. MEATS SHOULD BE BAKED, BROILED, OR BOILED. AVOID FRIED FOODS.    CONTINUE DEXILANT.  FOLLOW UP IN NOV 2019.

## 2018-05-04 NOTE — Telephone Encounter (Signed)
Pt is aware.  

## 2018-05-04 NOTE — Telephone Encounter (Signed)
OV made °

## 2018-05-07 ENCOUNTER — Encounter (HOSPITAL_COMMUNITY): Payer: Self-pay | Admitting: Gastroenterology
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
## 2018-05-28 ENCOUNTER — Other Ambulatory Visit (HOSPITAL_COMMUNITY): Payer: Self-pay | Admitting: Family Medicine

## 2018-05-28 ENCOUNTER — Ambulatory Visit (HOSPITAL_COMMUNITY)
Admission: RE | Admit: 2018-05-28 | Discharge: 2018-05-28 | Disposition: A | Discharge status: Home / Self Care | Payer: Medicare Other | Source: Ambulatory Visit | Attending: Family Medicine | Admitting: Family Medicine

## 2018-05-28 DIAGNOSIS — M545 Low back pain: Secondary | ICD-10-CM | POA: Diagnosis present

## 2018-05-28 DIAGNOSIS — I700 Atherosclerosis of aorta: Secondary | ICD-10-CM | POA: Insufficient documentation

## 2018-05-28 DIAGNOSIS — M2578 Osteophyte, vertebrae: Secondary | ICD-10-CM | POA: Insufficient documentation

## 2018-05-28 DIAGNOSIS — M5136 Other intervertebral disc degeneration, lumbar region: Secondary | ICD-10-CM | POA: Insufficient documentation

## 2018-05-28 DIAGNOSIS — M5137 Other intervertebral disc degeneration, lumbosacral region: Secondary | ICD-10-CM | POA: Diagnosis not present

## 2018-05-31 ENCOUNTER — Encounter (HOSPITAL_COMMUNITY): Payer: Self-pay | Admitting: Psychiatry

## 2018-05-31 ENCOUNTER — Ambulatory Visit (INDEPENDENT_AMBULATORY_CARE_PROVIDER_SITE_OTHER): Payer: Medicare Other | Admitting: Psychiatry

## 2018-05-31 VITALS — BP 110/68 | HR 80 | Ht 61.5 in | Wt 178.0 lb

## 2018-05-31 DIAGNOSIS — F4310 Post-traumatic stress disorder, unspecified: Secondary | ICD-10-CM

## 2018-05-31 DIAGNOSIS — Z79899 Other long term (current) drug therapy: Secondary | ICD-10-CM | POA: Diagnosis not present

## 2018-05-31 DIAGNOSIS — F331 Major depressive disorder, recurrent, moderate: Secondary | ICD-10-CM

## 2018-05-31 MED ORDER — DULOXETINE HCL 30 MG PO CPEP: ORAL_CAPSULE | ORAL | 0 refills | Status: DC

## 2018-05-31 MED ORDER — ALPRAZOLAM 0.25 MG PO TABS: 0.2500 mg | ORAL_TABLET | Freq: Two times a day (BID) | ORAL | 2 refills | Status: DC | PRN

## 2018-05-31 MED ORDER — DULOXETINE HCL 60 MG PO CPEP: ORAL_CAPSULE | ORAL | 0 refills | Status: DC

## 2018-05-31 NOTE — Patient Instructions (Signed)
1. Continue duloxetine 90 mg daily (60 mg +30 mg) 2. Continue Xanax 0.25 mg twice a day for anxiety  Return to clinic in three months for 15 mins

## 2018-06-07 ENCOUNTER — Ambulatory Visit: Payer: Medicare Other | Admitting: Gastroenterology

## 2018-06-11 ENCOUNTER — Other Ambulatory Visit (HOSPITAL_COMMUNITY): Payer: Self-pay | Admitting: Family Medicine

## 2018-06-11 DIAGNOSIS — M5136 Other intervertebral disc degeneration, lumbar region: Secondary | ICD-10-CM

## 2018-08-02 ENCOUNTER — Encounter (HOSPITAL_COMMUNITY): Payer: Self-pay | Admitting: Psychiatry

## 2018-08-02 ENCOUNTER — Ambulatory Visit (INDEPENDENT_AMBULATORY_CARE_PROVIDER_SITE_OTHER): Payer: Medicare Other | Admitting: Psychiatry

## 2018-08-02 DIAGNOSIS — F331 Major depressive disorder, recurrent, moderate: Secondary | ICD-10-CM

## 2018-08-02 DIAGNOSIS — F4310 Post-traumatic stress disorder, unspecified: Secondary | ICD-10-CM

## 2018-08-06 ENCOUNTER — Encounter (HOSPITAL_COMMUNITY): Payer: Self-pay | Admitting: Psychiatry
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
## 2018-08-16 ENCOUNTER — Ambulatory Visit (HOSPITAL_COMMUNITY)
Admission: RE | Admit: 2018-08-16 | Discharge: 2018-08-16 | Disposition: A | Discharge status: Home / Self Care | Payer: Medicare Other | Source: Ambulatory Visit | Attending: Adult Health | Admitting: Adult Health

## 2018-08-16 ENCOUNTER — Other Ambulatory Visit (HOSPITAL_COMMUNITY): Payer: Self-pay | Admitting: Adult Health

## 2018-08-16 DIAGNOSIS — R059 Cough, unspecified: Secondary | ICD-10-CM

## 2018-08-16 DIAGNOSIS — R05 Cough: Secondary | ICD-10-CM | POA: Insufficient documentation

## 2018-08-16 DIAGNOSIS — R918 Other nonspecific abnormal finding of lung field: Secondary | ICD-10-CM | POA: Diagnosis not present

## 2018-08-20 ENCOUNTER — Ambulatory Visit (HOSPITAL_COMMUNITY): Payer: Medicare Other | Admitting: Psychiatry
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
## 2018-08-28 ENCOUNTER — Ambulatory Visit (HOSPITAL_COMMUNITY): Payer: Self-pay | Admitting: Psychiatry

## 2018-09-03 ENCOUNTER — Ambulatory Visit: Payer: Medicare Other | Admitting: Gastroenterology

## 2018-09-03 NOTE — Progress Notes (Deleted)
Small hiatal hernia, mild gastritis.   Colonoscopy Nov 2018: Five 3-4 mm polyps in rectum and sigmoid, significant looping of left colon. Internal hemorrhoids. Surveillance in 5 years. Hyperplastic.

## 2018-09-21 ENCOUNTER — Other Ambulatory Visit (HOSPITAL_COMMUNITY): Payer: Self-pay | Admitting: Psychiatry

## 2018-09-21 ENCOUNTER — Encounter

## 2018-09-21 ENCOUNTER — Encounter (HOSPITAL_COMMUNITY): Payer: Self-pay | Admitting: Psychiatry

## 2018-09-21 ENCOUNTER — Ambulatory Visit (INDEPENDENT_AMBULATORY_CARE_PROVIDER_SITE_OTHER): Payer: Medicare Other | Admitting: Psychiatry

## 2018-09-21 DIAGNOSIS — F331 Major depressive disorder, recurrent, moderate: Secondary | ICD-10-CM

## 2018-09-21 DIAGNOSIS — F4310 Post-traumatic stress disorder, unspecified: Secondary | ICD-10-CM

## 2018-09-21 MED ORDER — DULOXETINE HCL 30 MG PO CPEP: ORAL_CAPSULE | ORAL | 0 refills | Status: DC
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
## 2018-10-10 ENCOUNTER — Ambulatory Visit (HOSPITAL_COMMUNITY): Payer: Medicare Other | Admitting: Psychiatry

## 2018-10-11 ENCOUNTER — Other Ambulatory Visit (HOSPITAL_COMMUNITY): Payer: Self-pay | Admitting: Psychiatry

## 2018-10-11 ENCOUNTER — Telehealth (HOSPITAL_COMMUNITY): Payer: Self-pay | Admitting: *Deleted

## 2018-10-11 MED ORDER — ALPRAZOLAM 0.25 MG PO TABS: 0.2500 mg | ORAL_TABLET | Freq: Two times a day (BID) | ORAL | 1 refills | Status: DC | PRN

## 2018-10-11 NOTE — Telephone Encounter (Signed)
Ordered for two months. Please remind her of no show policy, and advise her to make follow up. I will not be able to do any more refills without evaluation.

## 2018-10-11 NOTE — Telephone Encounter (Signed)
Dr Modesta Messing Patient called requesting refill on Xanax

## 2018-10-13 ENCOUNTER — Emergency Department (HOSPITAL_COMMUNITY)
Admission: EM | Admit: 2018-10-13 | Discharge: 2018-10-13 | Disposition: A | Discharge status: Home / Self Care | Payer: Medicare Other | Attending: Emergency Medicine | Admitting: Emergency Medicine

## 2018-10-13 ENCOUNTER — Other Ambulatory Visit: Payer: Self-pay

## 2018-10-13 ENCOUNTER — Encounter (HOSPITAL_COMMUNITY): Payer: Self-pay | Admitting: Emergency Medicine

## 2018-10-13 DIAGNOSIS — E1165 Type 2 diabetes mellitus with hyperglycemia: Secondary | ICD-10-CM | POA: Insufficient documentation

## 2018-10-13 DIAGNOSIS — G43809 Other migraine, not intractable, without status migrainosus: Secondary | ICD-10-CM | POA: Insufficient documentation

## 2018-10-13 DIAGNOSIS — Z7984 Long term (current) use of oral hypoglycemic drugs: Secondary | ICD-10-CM | POA: Insufficient documentation

## 2018-10-13 DIAGNOSIS — I10 Essential (primary) hypertension: Secondary | ICD-10-CM | POA: Diagnosis not present

## 2018-10-13 DIAGNOSIS — Z79899 Other long term (current) drug therapy: Secondary | ICD-10-CM | POA: Diagnosis not present

## 2018-10-13 DIAGNOSIS — F17210 Nicotine dependence, cigarettes, uncomplicated: Secondary | ICD-10-CM | POA: Insufficient documentation

## 2018-10-13 DIAGNOSIS — Z7982 Long term (current) use of aspirin: Secondary | ICD-10-CM | POA: Diagnosis not present

## 2018-10-13 DIAGNOSIS — R51 Headache: Secondary | ICD-10-CM | POA: Diagnosis present

## 2018-10-13 HISTORY — DX: Irritable bowel syndrome, unspecified: K58.9

## 2018-10-13 HISTORY — DX: Gastro-esophageal reflux disease without esophagitis: K21.9

## 2018-10-13 LAB — I-STAT CHEM 8, ED
BUN: 16 mg/dL (ref 6–20)
CREATININE: 0.9 mg/dL (ref 0.44–1.00)
Calcium, Ion: 1.12 mmol/L — ABNORMAL LOW (ref 1.15–1.40)
Chloride: 99 mmol/L (ref 98–111)
Glucose, Bld: 179 mg/dL — ABNORMAL HIGH (ref 70–99)
HEMATOCRIT: 47 % — AB (ref 36.0–46.0)
HEMOGLOBIN: 16 g/dL — AB (ref 12.0–15.0)
Potassium: 4.3 mmol/L (ref 3.5–5.1)
Sodium: 135 mmol/L (ref 135–145)
TCO2: 27 mmol/L (ref 22–32)

## 2018-10-13 LAB — CBG MONITORING, ED: GLUCOSE-CAPILLARY: 242 mg/dL — AB (ref 70–99)

## 2018-10-13 MED ORDER — METOCLOPRAMIDE HCL 5 MG/ML IJ SOLN
10.0000 mg | Freq: Once | INTRAMUSCULAR | Status: AC
  Administered 2018-10-13: 10 mg via INTRAVENOUS
  Filled 2018-10-13: qty 2

## 2018-10-13 MED ORDER — KETOROLAC TROMETHAMINE 30 MG/ML IJ SOLN
15.0000 mg | Freq: Once | INTRAMUSCULAR | Status: AC
  Administered 2018-10-13: 15 mg via INTRAVENOUS
  Filled 2018-10-13: qty 1

## 2018-10-13 MED ORDER — SODIUM CHLORIDE 0.9 % IV BOLUS
1000.0000 mL | Freq: Once | INTRAVENOUS | Status: AC
  Administered 2018-10-13: 1000 mL via INTRAVENOUS

## 2018-10-13 MED ORDER — DIPHENHYDRAMINE HCL 50 MG/ML IJ SOLN
50.0000 mg | Freq: Once | INTRAMUSCULAR | Status: AC
  Administered 2018-10-13: 50 mg via INTRAVENOUS
  Filled 2018-10-13: qty 1

## 2018-10-13 MED ORDER — ACETAMINOPHEN 325 MG PO TABS
650.0000 mg | ORAL_TABLET | Freq: Once | ORAL | Status: AC
  Administered 2018-10-13: 650 mg via ORAL
  Filled 2018-10-13: qty 2

## 2018-10-13 MED ORDER — METOCLOPRAMIDE HCL 10 MG PO TABS: 10.0000 mg | ORAL_TABLET | Freq: Four times a day (QID) | ORAL | 0 refills | Status: DC | PRN

## 2018-10-13 NOTE — Discharge Instructions (Signed)
Take over the counter tylenol and ibuprofen (OR excedrin) and benadryl, as directed on packaging, with the prescription given to you today, as needed for headache.  Keep a headache diary.  Call your regular medical doctor on Monday to schedule a follow up appointment within the next 2 days. Keep your previously scheduled appointment with your Neurologist for this week.   Return to the Emergency Department immediately sooner if worsening.

## 2018-10-13 NOTE — ED Triage Notes (Signed)
Patient c/o hyperglycemia with blood sugar of 297 this morning. Per patient only uses metformin for blood sugar. Patient also c/o migraine headache that started yesterday with nausea, vomiting, and photosensitivity. Per patient goes to neurologist and gets injections "decadron, lidocaine, and marcaine" in head for migraines, per patient had it Tuesday and goes back on 12/17 for more. Patient also reports taking oral medication, unsure of the name.

## 2018-10-13 NOTE — ED Provider Notes (Signed)
[facility] EMERGENCY DEPARTMENT Provider Note   CSN: [id number] Arrival date & time: 10/13/18  1154     History   Chief Complaint Chief Complaint  Patient presents with  . Hyperglycemia  . Migraine    HPI Katelyn Kelly is a 57 y.o. female.  HPI Pt was seen at 1215. Per pt, c/o gradual onset and persistence of constant acute flair of her chronic migraine headache for the past several weeks. Describes the headache as per her usual chronic migraine headache pain pattern for many years.  States she receives "injections by my Neurologist" for her headaches every 2 weeks, with her next appointment scheduled in 3 days. Denies headache was sudden or maximal in onset or at any time.  Denies visual changes, no focal motor weakness, no tingling/numbness in extremities, no fevers, no neck pain, no rash.  Pt also c/o "high blood sugars" since yesterday. Pt states her CBG yesterday was "in the 280's." Endorses compliance with her DM meds. Denies CP/SOB, no cough, no fevers, no abd pain, no N/V/D.     Past Medical History:  Diagnosis Date  . Anxiety and depression   . Bilateral carpal tunnel syndrome   . Chronic back pain   . Chronic neck pain   . Depression   . Diabetes mellitus   . GERD (gastroesophageal reflux disease)   . H/O syncope   . Headache   . Hypertension   . IBS (irritable bowel syndrome)   . Manic depression ([place])   . Panic attack     Patient Active Problem List   Diagnosis Date Noted  . Nausea with vomiting 02/14/2018  . GERD (gastroesophageal reflux disease) 11/08/2017  . Dysphagia 11/08/2017  . Taking multiple medications for chronic disease 08/02/2017  . Family history of colon cancer 08/02/2017  . IBS (irritable bowel syndrome) 08/02/2017  . LATERAL EPICONDYLITIS 08/27/2009  . JOINT PAIN, HAND 07/30/2009  . TRIGGER FINGER 07/30/2009  . CARPAL TUNNEL SYNDROME 03/04/2009  . CERVICAL RADICULITIS 03/04/2009  . NUMBNESS, HAND 02/02/2009    Past Surgical  History:  Procedure Laterality Date  . ABDOMINAL HYSTERECTOMY    . ABDOMINAL SURGERY    . APPENDECTOMY    . BIOPSY  09/05/2017   Procedure: BIOPSY;  Surgeon: [name], MD;  [facility];  Service: Endoscopy;;  random colon  . BIOPSY  05/01/2018   Procedure: BIOPSY;  Surgeon: [name], MD;  [facility];  Service: Endoscopy;;  gastric biopsy  . CARPAL TUNNEL RELEASE Bilateral   . CHOLECYSTECTOMY    . COLONOSCOPY WITH PROPOFOL N/A 09/05/2017   Procedure: COLONOSCOPY WITH PROPOFOL;  Surgeon: [name], MD;  [facility];  Service: Endoscopy;  Laterality: N/A;  10:45AM  . DILATION AND CURETTAGE OF UTERUS    . ESOPHAGOGASTRODUODENOSCOPY (EGD) WITH PROPOFOL N/A 05/01/2018   Procedure: ESOPHAGOGASTRODUODENOSCOPY (EGD) WITH PROPOFOL;  Surgeon: [name], MD;  [facility];  Service: Endoscopy;  Laterality: N/A;  10:15am  . POLYPECTOMY  09/05/2017   Procedure: POLYPECTOMY;  Surgeon: [name], MD;  [facility];  Service: Endoscopy;;  sigmoid and rectal  . TUBAL LIGATION       OB History    Gravida  4   Para  2   Term  2   Preterm      AB  2   Living  2     SAB  2   TAB  Ectopic      Multiple      Live Births               Home Medications    Prior to Admission medications   Medication Sig Start Date End Date Taking? Authorizing Provider  acetaminophen (TYLENOL) 500 MG tablet Take 1,500 mg by mouth every 6 (six) hours as needed for mild pain or moderate pain.     [provider]  ALPRAZolam Duanne Moron) 0.25 MG tablet Take 1 tablet (0.25 mg total) by mouth 2 (two) times daily as needed. 10/11/18   [name], MD  aspirin EC 81 MG tablet Take 81 mg by mouth daily.    [provider]  atorvastatin (LIPITOR) 10 MG tablet Take 10 mg by mouth daily.    [provider]  dexlansoprazole (DEXILANT) 60 MG capsule 1 PO EVERY MORNING WITH BREAKFAST. 05/01/18   Fields,  Marga Melnick, MD  DULoxetine (CYMBALTA) 30 MG capsule Take total of 90 mg daily (60 mg + 30 mg) 09/21/18   [name], MD  DULoxetine (CYMBALTA) 60 MG capsule Take 90 mg (60 mg + 30 mg) daily 05/31/18   [name], MD  gabapentin (NEURONTIN) 100 MG capsule Take 100 mg by mouth 3 (three) times daily.    [provider]  Hyprom-Naphaz-Polysorb-Zn Sulf (CLEAR EYES COMPLETE OP) Apply 1-2 drops to eye 2 (two) times daily as needed (dryness).     [provider]  ibuprofen (ADVIL,MOTRIN) 200 MG tablet Take 600 mg by mouth every 8 (eight) hours as needed for headache or mild pain.    [provider]  metFORMIN (GLUCOPHAGE) 500 MG tablet Take 1,000 mg by mouth 2 (two) times daily with a meal.     [provider]  ondansetron (ZOFRAN) 4 MG tablet Take 4 mg by mouth every 8 (eight) hours as needed for nausea or vomiting.    [provider]  pramipexole (MIRAPEX) 0.125 MG tablet  05/28/18   [provider]  topiramate (TOPAMAX) 25 MG tablet Take 50-75 mg by mouth See admin instructions. Takes 25 mg tablets    2 in the Am and 3 in the pm    [provider]  traMADol (ULTRAM) 50 MG tablet  05/28/18   [provider]  zonisamide (ZONEGRAN) 25 MG capsule Take 25 mg by mouth daily.    [provider]    Family History Family History  Problem Relation Age of Onset  . Diabetes Mother   . Stroke Mother   . Depression Mother   . Colon cancer Father 34       Passed away from colon cancer  . Colon cancer Brother 44       Passed away from colon cancer  . Bipolar disorder Son     Social History Social History   Tobacco Use  . Smoking status: Current Every Day Smoker    Packs/day: 1.00    Years: 40.00    Pack years: 40.00    Types: Cigarettes  . Smokeless tobacco: Never Used  . Tobacco comment: one pack a day  Substance Use Topics  . Alcohol use: No  . Drug use: No     Allergies   Sinequan [doxepin hcl]; Methylphenidate  derivatives; and Tranxene [clorazepate dipotassium]   Review of Systems Review of Systems ROS: Statement: All systems negative except as marked or noted in the HPI; Constitutional: Negative for fever and chills. ; ; Eyes: Negative for eye pain, redness and  discharge. ; ; ENMT: Negative for ear pain, hoarseness, nasal congestion, sinus pressure and sore throat. ; ; Cardiovascular: Negative for chest pain, palpitations, diaphoresis, dyspnea and peripheral edema. ; ; Respiratory: Negative for cough, wheezing and stridor. ; ; Gastrointestinal: Negative for nausea, vomiting, diarrhea, abdominal pain, blood in stool, hematemesis, jaundice and rectal bleeding. . ; ; Genitourinary: Negative for dysuria, flank pain and hematuria. ; ; Musculoskeletal: Negative for back pain and neck pain. Negative for swelling and trauma.; ; Skin: Negative for pruritus, rash, abrasions, blisters, bruising and skin lesion.; ; Neuro: +chronic headache. Negative for lightheadedness and neck stiffness. Negative for weakness, altered level of consciousness, altered mental status, extremity weakness, paresthesias, involuntary movement, seizure and syncope.       Physical Exam Updated Vital Signs BP (!) 192/87 (BP Location: Right Arm) Comment: Pt having a panic attack  Pulse (!) 109   Temp 98.1 F (36.7 C) (Temporal)   Resp (!) 24   Ht 5\' 1"  (1.549 m)   Wt 80.7 kg   SpO2 100%   BMI 33.63 kg/m   Physical Exam 1220: Physical examination:  Nursing notes reviewed; Vital signs and O2 SAT reviewed;  Constitutional: Well developed, Well nourished, Well hydrated, In no acute distress; Head:  Normocephalic, atraumatic; Eyes: EOMI, PERRL, No scleral icterus; ENMT: Mouth and pharynx normal, Mucous membranes moist; Neck: Supple, Full range of motion, No lymphadenopathy; Cardiovascular: Regular rate and rhythm, No gallop; Respiratory: Breath sounds clear & equal bilaterally, No wheezes.  Speaking full sentences with ease, Normal  respiratory effort/excursion; Chest: Nontender, Movement normal; Abdomen: Soft, Nontender, Nondistended, Normal bowel sounds; Genitourinary: No CVA tenderness; Extremities: Peripheral pulses normal, No tenderness, No edema, No calf edema or asymmetry.; Neuro: AA&Ox3, Major CN grossly intact.  Speech clear. No gross focal motor or sensory deficits in extremities.; Skin: Color normal, Warm, Dry.    ED Treatments / Results  Labs (all labs ordered are listed, but only abnormal results are displayed)   EKG None  Radiology   Procedures Procedures (including critical care time)  Medications Ordered in ED Medications  sodium chloride 0.9 % bolus 1,000 mL (1,000 mLs Intravenous New Bag/Given 10/13/18 1246)  metoCLOPramide (REGLAN) injection 10 mg (10 mg Intravenous Given 10/13/18 1247)  diphenhydrAMINE (BENADRYL) injection 50 mg (50 mg Intravenous Given 10/13/18 1247)  ketorolac (TORADOL) 30 MG/ML injection 15 mg (15 mg Intravenous Given 10/13/18 1247)  acetaminophen (TYLENOL) tablet 650 mg (650 mg Oral Given 10/13/18 1255)     Initial Impression / Assessment and Plan / ED Course  I have reviewed the triage vital signs and the nursing notes.  Pertinent labs & imaging results that were available during my care of the patient were reviewed by me and considered in my medical decision making (see chart for details).  MDM Reviewed: previous chart, nursing note and vitals Reviewed previous: labs Interpretation: labs   Results for orders placed or performed during the hospital encounter of 10/13/18  CBG monitoring, ED  Result Value Ref Range   Glucose-Capillary 242 (H) 70 - 99 mg/dL  I-stat Chem 8, ED  Result Value Ref Range   Sodium 135 135 - 145 mmol/L   Potassium 4.3 3.5 - 5.1 mmol/L   Chloride 99 98 - 111 mmol/L   BUN 16 6 - 20 mg/dL   Creatinine, Ser 0.90 0.44 - 1.00 mg/dL   Glucose, Bld 179 (H) 70 - 99 mg/dL   Calcium, Ion 1.12 (L) 1.15 - 1.40 mmol/L   TCO2 27 22 - 32  mmol/L    Hemoglobin 16.0 (H) 12.0 - 15.0 g/dL   HCT 47.0 (H) 36.0 - 46.0 %    1410:  Pt has tol PO well while in the ED without N/V. Abd remains benign, resps easy, VSS. Feels better and wants to go home now. Workup reassuring. No red flags. Tx symptomatically, f/u Neuro MD as previously scheduled. Dx and testing d/w pt.  Questions answered.  Verb understanding, agreeable to d/c home with outpt f/u.         Final Clinical Impressions(s) / ED Diagnoses   Final diagnoses:  None    ED Discharge Orders    None       Francine Graven, DO 10/17/18 1305

## 2018-10-29 ENCOUNTER — Ambulatory Visit (HOSPITAL_COMMUNITY): Payer: Medicare Other | Admitting: Psychiatry

## 2018-11-08 DIAGNOSIS — J208 Acute bronchitis due to other specified organisms: Secondary | ICD-10-CM | POA: Diagnosis not present

## 2018-11-08 DIAGNOSIS — J310 Chronic rhinitis: Secondary | ICD-10-CM | POA: Diagnosis not present

## 2018-11-08 DIAGNOSIS — E119 Type 2 diabetes mellitus without complications: Secondary | ICD-10-CM | POA: Diagnosis not present

## 2018-11-13 ENCOUNTER — Ambulatory Visit (INDEPENDENT_AMBULATORY_CARE_PROVIDER_SITE_OTHER): Payer: Medicare Other | Admitting: Psychiatry

## 2018-11-13 ENCOUNTER — Telehealth (HOSPITAL_COMMUNITY): Payer: Self-pay | Admitting: *Deleted

## 2018-11-13 ENCOUNTER — Other Ambulatory Visit (HOSPITAL_COMMUNITY): Payer: Self-pay | Admitting: Psychiatry

## 2018-11-13 DIAGNOSIS — F331 Major depressive disorder, recurrent, moderate: Secondary | ICD-10-CM

## 2018-11-13 DIAGNOSIS — F4310 Post-traumatic stress disorder, unspecified: Secondary | ICD-10-CM

## 2018-11-13 MED ORDER — DULOXETINE HCL 30 MG PO CPEP: ORAL_CAPSULE | ORAL | 0 refills | Status: DC

## 2018-11-13 MED ORDER — DULOXETINE HCL 60 MG PO CPEP: ORAL_CAPSULE | ORAL | 0 refills | Status: DC

## 2018-11-13 NOTE — Telephone Encounter (Signed)
Dr Harrington Challenger Dr Modesta Messing patient request refills on the Cymbalta 60 & 30 mg. Net visit 11/26/2018

## 2018-11-13 NOTE — Telephone Encounter (Signed)
sent 

## 2018-11-13 NOTE — Progress Notes (Signed)
   THERAPIST PROGRESS NOTE  Session Time:  Tuesday 11/13/2018 3:20 PM - 4:05 PM   Participation Level: Active  Behavioral Response: CasualAlertAnxious and Depressed              Type of Therapy: Individual Therapy  Treatment Goals addressed: Learn and implement cognitive and behavioral strategies to cope with depression and anxiety  Interventions: CBT and Supportive  Summary: Katelyn Kelly is a 58 y.o. female who  is referred for services by psychiatrist Dr. Modesta Messing due to experiencing symptoms of anxiety and depression. Patient reportst 3-4 psychiatric hospitalizations due to depression several years ago.  She reports participating in outpatient medication management and psychotherapy at [facility]  since 1979. Patient reports leaving [facility] as she she would be assigned to go to group when she missed appointments for individual therapy. She reports feeling nervous and uncomfortable in group. She reports feeling emotional all the time and experiencing stress related to migraines and her financial situation. She also reports stress related to son who has drug addiction. She reports unresolved grief and loss issues related to her mother's death in Sep 11, 2017.  She reports constant crying, wanting to stay in bed and dark all the time, not wanting to associate with people, havingno friends, feeingl nervous, heart racing , breaking out into a hot sweat, avoiding going places because it seems like the walls are closing in.  Patient reports increased anxiety since last session. She has experienced increased migraines as she had to discontinue her injections. Per patient's report, the injections elevated her glucose levels resulting in complications with diabetes. She also reports experiencing increased anxiety and expresses frustration dosage of Xanax has been decreased. Patient reports becoming so nervous that she doesn't feel like eating. She also states crying at the drop of a hat. However, she denies  feeling depressed and says she just cries out of the blue. She reports missing her mother during the holidays but managing a little better now. She is pleased there is decreased stress at home. Per patient's report, she used assertiveness skills and set limits with her son and her brother. Her son now is working and has been more respectful, supportive to patient.    Suicidal/Homicidal: Nowithout intent/plan  Therapist Response: Reviewed symptoms, discussed stressors, facilitated expression of thoughts and feelings, validated feelings, assisted patient identify the way her body experiences stress and anxiety, discussed and practiced using deep breathing to trigger relaxation response, assigned patient to practice 5-10 minutes 2 times per day, Plan: Return again in 2 weeks.  Diagnosis: Axis I: MDD, Recurrent    PTSD    Axis II: Deferred    Alonza Smoker, LCSW 11/13/2018

## 2018-11-18 ENCOUNTER — Emergency Department (HOSPITAL_COMMUNITY)
Admission: EM | Admit: 2018-11-18 | Discharge: 2018-11-19 | Disposition: A | Discharge status: Home / Self Care | Payer: Medicare Other | Attending: Emergency Medicine | Admitting: Emergency Medicine

## 2018-11-18 ENCOUNTER — Other Ambulatory Visit: Payer: Self-pay

## 2018-11-18 ENCOUNTER — Encounter (HOSPITAL_COMMUNITY): Payer: Self-pay | Admitting: Emergency Medicine

## 2018-11-18 DIAGNOSIS — Z7982 Long term (current) use of aspirin: Secondary | ICD-10-CM | POA: Insufficient documentation

## 2018-11-18 DIAGNOSIS — G43809 Other migraine, not intractable, without status migrainosus: Secondary | ICD-10-CM | POA: Insufficient documentation

## 2018-11-18 DIAGNOSIS — I10 Essential (primary) hypertension: Secondary | ICD-10-CM | POA: Insufficient documentation

## 2018-11-18 DIAGNOSIS — Z7984 Long term (current) use of oral hypoglycemic drugs: Secondary | ICD-10-CM | POA: Diagnosis not present

## 2018-11-18 DIAGNOSIS — E1165 Type 2 diabetes mellitus with hyperglycemia: Secondary | ICD-10-CM | POA: Insufficient documentation

## 2018-11-18 DIAGNOSIS — Z79899 Other long term (current) drug therapy: Secondary | ICD-10-CM | POA: Diagnosis not present

## 2018-11-18 DIAGNOSIS — F17210 Nicotine dependence, cigarettes, uncomplicated: Secondary | ICD-10-CM | POA: Diagnosis not present

## 2018-11-18 DIAGNOSIS — R739 Hyperglycemia, unspecified: Secondary | ICD-10-CM

## 2018-11-18 DIAGNOSIS — R51 Headache: Secondary | ICD-10-CM | POA: Diagnosis present

## 2018-11-18 LAB — CBC
HCT: 46.8 % — ABNORMAL HIGH (ref 36.0–46.0)
Hemoglobin: 14.8 g/dL (ref 12.0–15.0)
MCH: 30.3 pg (ref 26.0–34.0)
MCHC: 31.6 g/dL (ref 30.0–36.0)
MCV: 95.9 fL (ref 80.0–100.0)
NRBC: 0 % (ref 0.0–0.2)
Platelets: 260 10*3/uL (ref 150–400)
RBC: 4.88 MIL/uL (ref 3.87–5.11)
RDW: 13.2 % (ref 11.5–15.5)
WBC: 16.7 10*3/uL — ABNORMAL HIGH (ref 4.0–10.5)

## 2018-11-18 LAB — BASIC METABOLIC PANEL
Anion gap: 9 (ref 5–15)
BUN: 18 mg/dL (ref 6–20)
CO2: 25 mmol/L (ref 22–32)
Calcium: 8.8 mg/dL — ABNORMAL LOW (ref 8.9–10.3)
Chloride: 102 mmol/L (ref 98–111)
Creatinine, Ser: 0.93 mg/dL (ref 0.44–1.00)
GFR calc Af Amer: >60 mL/min (ref >60)
GFR calc non Af Amer: >60 mL/min (ref >60)
Glucose, Bld: 225 mg/dL — ABNORMAL HIGH (ref 70–99)
POTASSIUM: 3.4 mmol/L — AB (ref 3.5–5.1)
Sodium: 136 mmol/L (ref 135–145)

## 2018-11-18 LAB — CBG MONITORING, ED: Glucose-Capillary: 272 mg/dL — ABNORMAL HIGH (ref 70–99)

## 2018-11-18 MED ORDER — DIPHENHYDRAMINE HCL 50 MG/ML IJ SOLN
25.0000 mg | Freq: Once | INTRAMUSCULAR | Status: AC
  Administered 2018-11-18: 25 mg via INTRAVENOUS
  Filled 2018-11-18: qty 1

## 2018-11-18 MED ORDER — KETOROLAC TROMETHAMINE 30 MG/ML IJ SOLN
30.0000 mg | Freq: Once | INTRAMUSCULAR | Status: AC
  Administered 2018-11-18: 30 mg via INTRAVENOUS
  Filled 2018-11-18: qty 1

## 2018-11-18 MED ORDER — PROCHLORPERAZINE EDISYLATE 10 MG/2ML IJ SOLN
10.0000 mg | Freq: Once | INTRAMUSCULAR | Status: AC
  Administered 2018-11-18: 10 mg via INTRAVENOUS
  Filled 2018-11-18: qty 2

## 2018-11-18 MED ORDER — SODIUM CHLORIDE 0.9 % IV BOLUS
1000.0000 mL | Freq: Once | INTRAVENOUS | Status: AC
  Administered 2018-11-18: 1000 mL via INTRAVENOUS

## 2018-11-18 NOTE — Discharge Instructions (Addendum)
Continue your current medications, follow-up with your doctor next week to check on your blood sugar

## 2018-11-18 NOTE — ED Triage Notes (Signed)
Pt c/o high blood sugar states her cbg at home was 422. Pt states her cbg at home is normally 150. Registration called and states pt passed out in ED waiting room.

## 2018-11-18 NOTE — ED Provider Notes (Signed)
[facility] EMERGENCY DEPARTMENT Provider Note   CSN: [id number] Arrival date & time: 11/18/18  2210     History   Chief Complaint Chief Complaint  Patient presents with  . Hyperglycemia    HPI Katelyn Kelly is a 58 y.o. female.  HPI Patient presented to the emergency room for evaluation of headache as well as hyperglycemia.  Patient states she has a history of migraine headaches.  She started having a bad migraine this evening.  Patient has a history of having high blood sugar when she has her migraine so she checked it and it was elevated in the 400s.  This concerned her.  She took a metformin and came to the emergency room.  While she was checking in here the patient had a near syncopal episode.  There was no seizure activity and she quickly regained consciousness.  Patient denies any trouble with any chest pain or shortness of breath.  She has not had any vomiting or diarrhea.  She denies any blood in her stool.  She denies any fevers or chills. Past Medical History:  Diagnosis Date  . Anxiety and depression   . Bilateral carpal tunnel syndrome   . Chronic back pain   . Chronic neck pain   . Depression   . Diabetes mellitus   . GERD (gastroesophageal reflux disease)   . H/O syncope   . Headache   . Hypertension   . IBS (irritable bowel syndrome)   . Manic depression ([place])   . Panic attack     Patient Active Problem List   Diagnosis Date Noted  . Nausea with vomiting 02/14/2018  . GERD (gastroesophageal reflux disease) 11/08/2017  . Dysphagia 11/08/2017  . Taking multiple medications for chronic disease 08/02/2017  . Family history of colon cancer 08/02/2017  . IBS (irritable bowel syndrome) 08/02/2017  . LATERAL EPICONDYLITIS 08/27/2009  . JOINT PAIN, HAND 07/30/2009  . TRIGGER FINGER 07/30/2009  . CARPAL TUNNEL SYNDROME 03/04/2009  . CERVICAL RADICULITIS 03/04/2009  . NUMBNESS, HAND 02/02/2009    Past Surgical History:  Procedure Laterality Date  .  ABDOMINAL HYSTERECTOMY    . ABDOMINAL SURGERY    . APPENDECTOMY    . BIOPSY  09/05/2017   Procedure: BIOPSY;  Surgeon: [name], MD;  [facility];  Service: Endoscopy;;  random colon  . BIOPSY  05/01/2018   Procedure: BIOPSY;  Surgeon: [name], MD;  [facility];  Service: Endoscopy;;  gastric biopsy  . CARPAL TUNNEL RELEASE Bilateral   . CHOLECYSTECTOMY    . COLONOSCOPY WITH PROPOFOL N/A 09/05/2017   Procedure: COLONOSCOPY WITH PROPOFOL;  Surgeon: [name], MD;  [facility];  Service: Endoscopy;  Laterality: N/A;  10:45AM  . DILATION AND CURETTAGE OF UTERUS    . ESOPHAGOGASTRODUODENOSCOPY (EGD) WITH PROPOFOL N/A 05/01/2018   Procedure: ESOPHAGOGASTRODUODENOSCOPY (EGD) WITH PROPOFOL;  Surgeon: [name], MD;  [facility];  Service: Endoscopy;  Laterality: N/A;  10:15am  . POLYPECTOMY  09/05/2017   Procedure: POLYPECTOMY;  Surgeon: [name], MD;  [facility];  Service: Endoscopy;;  sigmoid and rectal  . TUBAL LIGATION       OB History    Gravida  4   Para  2   Term  2   Preterm      AB  2   Living  2     SAB  2   TAB  Ectopic      Multiple      Live Births               Home Medications    Prior to Admission medications   Medication Sig Start Date End Date Taking? Authorizing Provider  acetaminophen (TYLENOL) 500 MG tablet Take 1,000 mg by mouth every 6 (six) hours as needed for mild pain or moderate pain.    Yes [provider]  ALPRAZolam (XANAX) 0.25 MG tablet Take 1 tablet (0.25 mg total) by mouth 2 (two) times daily as needed. Patient taking differently: Take 0.25 mg by mouth 2 (two) times daily.  10/11/18  Yes [name], MD  aspirin EC 81 MG tablet Take 81 mg by mouth every evening.    Yes [provider]  atorvastatin (LIPITOR) 10 MG tablet Take 10 mg by mouth every evening.    Yes [provider]  DULoxetine (CYMBALTA) 30 MG capsule  Take total of 90 mg daily (60 mg + 30 mg) Patient taking differently: Take 30 mg by mouth every morning. Take total of 90 mg daily (60 mg + 30 mg) 11/13/18  Yes Cloria Spring, MD  DULoxetine (CYMBALTA) 60 MG capsule Take 90 mg (60 mg + 30 mg) daily Patient taking differently: Take 60 mg by mouth every morning. Take 90 mg (60 mg + 30 mg) daily 11/13/18  Yes Cloria Spring, MD  metFORMIN (GLUCOPHAGE) 500 MG tablet Take 1,000 mg by mouth 2 (two) times daily with a meal.    Yes [provider]  metoCLOPramide (REGLAN) 10 MG tablet Take 1 tablet (10 mg total) by mouth every 6 (six) hours as needed for nausea (or headache). 10/13/18  Yes Francine Graven, DO  pramipexole (MIRAPEX) 0.125 MG tablet Take 0.125 mg by mouth at bedtime.  05/28/18  Yes [provider]  PROAIR HFA 108 (90 Base) MCG/ACT inhaler Inhale 1-2 puffs into the lungs every 6 (six) hours as needed for wheezing or shortness of breath.  11/07/18  Yes [provider]  dexlansoprazole (DEXILANT) 60 MG capsule 1 PO EVERY MORNING WITH BREAKFAST. Patient not taking: Reported on 11/18/2018 05/01/18   [name], MD  doxycycline (VIBRAMYCIN) 100 MG capsule Take 100 mg by mouth 2 (two) times daily. 5 day course starting on 11/08/2018 11/08/18   [provider]  methylPREDNISolone (MEDROL DOSEPAK) 4 MG TBPK tablet Take 4-24 mg by mouth See admin instructions. 6 day course starting on 11/08/2018 11/08/18   [provider]    Family History Family History  Problem Relation Age of Onset  . Diabetes Mother   . Stroke Mother   . Depression Mother   . Colon cancer Father 37       Passed away from colon cancer  . Colon cancer Brother 34       Passed away from colon cancer  . Bipolar disorder Son     Social History Social History   Tobacco Use  . Smoking status: Current Every Day Smoker    Packs/day: 1.00    Years: 40.00    Pack years: 40.00    Types: Cigarettes  . Smokeless tobacco: Never Used  .  Tobacco comment: one pack a day  Substance Use Topics  . Alcohol use: No  . Drug use: No     Allergies   Sinequan [doxepin hcl]; Albuterol; Haloperidol; Methylphenidate derivatives; Sertraline; Tranxene [clorazepate dipotassium]; and Vortioxetine   Review of Systems Review of Systems  All other  systems reviewed and are negative.    Physical Exam Updated Vital Signs BP 139/83 (BP Location: Left Arm)   Pulse 96   Temp 97.6 F (36.4 C) (Oral)   Resp (!) 21   Ht 1.549 m (5\' 1" )   Wt 79.4 kg   SpO2 98%   BMI 33.07 kg/m   Physical Exam Vitals signs and nursing note reviewed.  Constitutional:      General: She is not in acute distress.    Appearance: She is well-developed.  HENT:     Head: Normocephalic and atraumatic.     Right Ear: External ear normal.     Left Ear: External ear normal.  Eyes:     General: No scleral icterus.       Right eye: No discharge.        Left eye: No discharge.     Conjunctiva/sclera: Conjunctivae normal.  Neck:     Musculoskeletal: Neck supple.     Trachea: No tracheal deviation.  Cardiovascular:     Rate and Rhythm: Normal rate and regular rhythm.  Pulmonary:     Effort: Pulmonary effort is normal. No respiratory distress.     Breath sounds: Normal breath sounds. No stridor. No wheezing or rales.  Abdominal:     General: Bowel sounds are normal. There is no distension.     Palpations: Abdomen is soft.     Tenderness: There is no abdominal tenderness. There is no guarding or rebound.  Musculoskeletal:        General: No tenderness.  Skin:    General: Skin is warm and dry.     Findings: No rash.  Neurological:     Mental Status: She is alert.     Cranial Nerves: No cranial nerve deficit (no facial droop, extraocular movements intact, no slurred speech).     Sensory: No sensory deficit.     Motor: No abnormal muscle tone or seizure activity.     Coordination: Coordination normal.      ED Treatments / Results  Labs (all labs  ordered are listed, but only abnormal results are displayed) Labs Reviewed  CBC - Abnormal; Notable for the following components:      Result Value   WBC 16.7 (*)    HCT 46.8 (*)    All other components within normal limits  BASIC METABOLIC PANEL - Abnormal; Notable for the following components:   Potassium 3.4 (*)    Glucose, Bld 225 (*)    Calcium 8.8 (*)    All other components within normal limits  CBG MONITORING, ED - Abnormal; Notable for the following components:   Glucose-Capillary 272 (*)    All other components within normal limits    EKG EKG Interpretation  Date/Time:  Sunday November 18 2018 22:20:32 EST Ventricular Rate:  95 PR Interval:    QRS Duration: 97 QT Interval:  366 QTC Calculation: 461 R Axis:   83 Text Interpretation:  Sinus rhythm Borderline prolonged PR interval Right atrial enlargement Low voltage, precordial leads Baseline wander in lead(s) II III aVF No significant change since last tracing Confirmed by Dorie Rank [phone]) on 11/18/2018 10:26:55 PM   Radiology No results found.  Procedures Procedures (including critical care time)  Medications Ordered in ED Medications  sodium chloride 0.9 % bolus 1,000 mL (1,000 mLs Intravenous New Bag/Given 11/18/18 2318)  prochlorperazine (COMPAZINE) injection 10 mg (10 mg Intravenous Given 11/18/18 2319)  diphenhydrAMINE (BENADRYL) injection 25 mg (25 mg Intravenous Given 11/18/18 2320)  ketorolac (TORADOL) 30 MG/ML injection 30 mg (30 mg Intravenous Given 11/18/18 2318)     Initial Impression / Assessment and Plan / ED Course  I have reviewed the triage vital signs and the nursing notes.  Pertinent labs & imaging results that were available during my care of the patient were reviewed by me and considered in my medical decision making (see chart for details).  Clinical Course as of Nov 18 2344  Nancy Fetter Nov 18, 2018  2345 Symptoms have improved.  Patient is feeling better now.   [JJ]  9417 Labs notable for  increased white blood cell count and hyperglycemia but no signs of acidosis.   [JK]    Clinical Course User Index [JK] Dorie Rank, MD    Patient presented with primary complaint of migraine headache today.  This is a typical headache for her.  She does have an increased white blood cell count but there are no findings to suggest meningitis or acute infection.  Patient did have elevated blood sugar as well but there are no signs of acidosis some this can be safely followed up as an outpatient.  She had a syncopal episode today but I think this is likely a vasovagal event associated with her headache and nausea.  Symptoms have improved and she is stable for discharge  Final Clinical Impressions(s) / ED Diagnoses   Final diagnoses:  Hyperglycemia  Other migraine without status migrainosus, not intractable    ED Discharge Orders    None       Dorie Rank, MD 11/18/18 2346

## 2018-11-23 ENCOUNTER — Other Ambulatory Visit: Payer: Self-pay | Admitting: Nurse Practitioner
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
## 2018-11-26 ENCOUNTER — Ambulatory Visit (INDEPENDENT_AMBULATORY_CARE_PROVIDER_SITE_OTHER): Payer: Medicare Other | Admitting: Psychiatry

## 2018-11-26 ENCOUNTER — Encounter (HOSPITAL_COMMUNITY): Payer: Self-pay | Admitting: Psychiatry

## 2018-11-26 VITALS — BP 118/81 | HR 93 | Ht 61.0 in | Wt 180.0 lb

## 2018-11-26 DIAGNOSIS — F331 Major depressive disorder, recurrent, moderate: Secondary | ICD-10-CM

## 2018-11-26 DIAGNOSIS — F4310 Post-traumatic stress disorder, unspecified: Secondary | ICD-10-CM | POA: Diagnosis not present

## 2018-11-26 MED ORDER — DULOXETINE HCL 60 MG PO CPEP: 120.0000 mg | ORAL_CAPSULE | Freq: Every day | ORAL | 0 refills | Status: DC

## 2018-11-26 MED ORDER — ALPRAZOLAM 0.25 MG PO TABS: 0.2500 mg | ORAL_TABLET | Freq: Two times a day (BID) | ORAL | 2 refills | Status: DC | PRN

## 2018-11-26 NOTE — Patient Instructions (Signed)
1. Increase duloxetine 120 mg daily  2. Continue Xanax 0.25 mg twice a day for anxiety   3. Return to clinic in three months for 15 mins

## 2018-11-29 DIAGNOSIS — R51 Headache: Secondary | ICD-10-CM | POA: Diagnosis not present

## 2018-11-29 DIAGNOSIS — E1165 Type 2 diabetes mellitus with hyperglycemia: Secondary | ICD-10-CM | POA: Diagnosis not present

## 2018-12-04 ENCOUNTER — Ambulatory Visit (HOSPITAL_COMMUNITY): Payer: Medicare Other | Admitting: Psychiatry

## 2018-12-04 ENCOUNTER — Encounter (HOSPITAL_COMMUNITY): Payer: Self-pay | Admitting: Psychiatry

## 2018-12-04 NOTE — Progress Notes (Unsigned)
  Outpatient Therapist Discharge Summary  Katelyn Kelly    [birth year]   Admission Date: 08/02/2018 Discharge Date: 12/04/2018 Reason for Discharge: Excessive no shows Diagnosis:  Axis I:  Major Depressive Disorder   Comments:    [name], LCSW 12/04/2018

## 2018-12-12 DIAGNOSIS — Z7984 Long term (current) use of oral hypoglycemic drugs: Secondary | ICD-10-CM | POA: Diagnosis not present

## 2018-12-12 DIAGNOSIS — R252 Cramp and spasm: Secondary | ICD-10-CM | POA: Diagnosis not present

## 2018-12-12 DIAGNOSIS — E1165 Type 2 diabetes mellitus with hyperglycemia: Secondary | ICD-10-CM | POA: Diagnosis not present

## 2018-12-18 ENCOUNTER — Ambulatory Visit (HOSPITAL_COMMUNITY): Payer: Medicare Other | Admitting: Psychiatry

## 2018-12-31 DIAGNOSIS — Z7984 Long term (current) use of oral hypoglycemic drugs: Secondary | ICD-10-CM | POA: Diagnosis not present

## 2018-12-31 DIAGNOSIS — H0230 Blepharochalasis unspecified eye, unspecified eyelid: Secondary | ICD-10-CM | POA: Diagnosis not present

## 2018-12-31 DIAGNOSIS — Z794 Long term (current) use of insulin: Secondary | ICD-10-CM | POA: Diagnosis not present

## 2018-12-31 DIAGNOSIS — E119 Type 2 diabetes mellitus without complications: Secondary | ICD-10-CM | POA: Diagnosis not present

## 2019-01-24 DIAGNOSIS — E119 Type 2 diabetes mellitus without complications: Secondary | ICD-10-CM | POA: Diagnosis not present

## 2019-01-24 DIAGNOSIS — I10 Essential (primary) hypertension: Secondary | ICD-10-CM | POA: Diagnosis not present

## 2019-01-24 DIAGNOSIS — M5442 Lumbago with sciatica, left side: Secondary | ICD-10-CM | POA: Diagnosis not present

## 2019-01-24 DIAGNOSIS — E785 Hyperlipidemia, unspecified: Secondary | ICD-10-CM | POA: Diagnosis not present
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
## 2019-02-25 ENCOUNTER — Ambulatory Visit (HOSPITAL_COMMUNITY): Payer: Medicare Other | Admitting: Psychiatry

## 2019-02-25 ENCOUNTER — Other Ambulatory Visit: Payer: Self-pay

## 2019-03-20 DIAGNOSIS — M545 Low back pain: Secondary | ICD-10-CM | POA: Diagnosis not present

## 2019-03-20 DIAGNOSIS — Z7984 Long term (current) use of oral hypoglycemic drugs: Secondary | ICD-10-CM | POA: Diagnosis not present

## 2019-03-20 DIAGNOSIS — Z794 Long term (current) use of insulin: Secondary | ICD-10-CM | POA: Diagnosis not present

## 2019-03-20 DIAGNOSIS — E119 Type 2 diabetes mellitus without complications: Secondary | ICD-10-CM | POA: Diagnosis not present

## 2019-03-20 DIAGNOSIS — R109 Unspecified abdominal pain: Secondary | ICD-10-CM | POA: Diagnosis not present

## 2019-03-20 DIAGNOSIS — M5442 Lumbago with sciatica, left side: Secondary | ICD-10-CM | POA: Diagnosis not present

## 2019-03-28 ENCOUNTER — Encounter: Payer: Self-pay | Admitting: Gastroenterology

## 2019-03-28 ENCOUNTER — Other Ambulatory Visit: Payer: Self-pay

## 2019-03-28 ENCOUNTER — Ambulatory Visit (INDEPENDENT_AMBULATORY_CARE_PROVIDER_SITE_OTHER): Payer: Medicare Other | Admitting: Gastroenterology

## 2019-03-28 DIAGNOSIS — K580 Irritable bowel syndrome with diarrhea: Secondary | ICD-10-CM

## 2019-03-28 DIAGNOSIS — K219 Gastro-esophageal reflux disease without esophagitis: Secondary | ICD-10-CM | POA: Diagnosis not present

## 2019-03-28 DIAGNOSIS — R1010 Upper abdominal pain, unspecified: Secondary | ICD-10-CM | POA: Diagnosis not present

## 2019-03-28 MED ORDER — DEXLANSOPRAZOLE 60 MG PO CPDR: DELAYED_RELEASE_CAPSULE | ORAL | 11 refills | Status: DC

## 2019-03-28 NOTE — Patient Instructions (Signed)
TO BETTER CONTROL REFLUX:    1. DRINK WATER TO KEEP YOUR URINE LIGHT YELLOW. AVOID SODA. DRINK LESS TEA.    2. AVOID REFLUX TRIGGERS. SEE INFO BELOW.    3. FOLLOW A LOW FAT DIET. SEE INFO BELOW.    4. CONTINUE YOUR WEIGHT LOSS EFFORTS.    5. RE-START DEXLANT.   TO REDUCE LOOSE STOOL:    1. FOLLOW A LOW FAT DIET BECAUSE YOU DO NOT HAVE A GALLBLADDER.    2. IF YOU CONSUME DAIRY, ADD LACTASE 3 PILLS WITH MEALS UP TO THREE TIMES A DAY.    3. CHEW ONE TUMS WITH MEALS UP TO THREE TIMES A  DAY TO PREVENT DIARRHEA.   SEE DR. Arnoldo Morale TO DISCUSS THE BENEFITS V. RISKS OF SURGERY.  FOLLOW UP IN 6 MOS.     Lifestyle and home remedies TO MANAGE REFLUX/CHEST PAIN  You may eliminate or reduce the frequency of heartburn by making the following lifestyle changes:  . Control your weight. Being overweight is a major risk factor for heartburn and GERD. Excess pounds put pressure on your abdomen, pushing up your stomach and causing acid to back up into your esophagus.   . Eat smaller meals. 4 TO 6 MEALS A DAY. This reduces pressure on the lower esophageal sphincter, helping to prevent the valve from opening and acid from washing back into your esophagus.   Dolphus Jenny your belt. Clothes that fit tightly around your waist put pressure on your abdomen and the lower esophageal sphincter.   . Eliminate heartburn triggers. Everyone has specific triggers. Common triggers such as fatty or fried foods, spicy food, tomato sauce, carbonated beverages, alcohol, chocolate, mint, garlic, onion, caffeine and nicotine may make heartburn worse.   Marland Kitchen Avoid stooping or bending. Tying your shoes is OK. Bending over for longer periods to weed your garden isn't, especially soon after eating.   . Don't lie down after a meal. Wait at least three to four hours after eating before going to bed, and don't lie down right after eating.   Marland Kitchen SLEEP WITH ON A WEDGE OR PUT THE HEAD OF YOUR BED ON 6 INCH BLOCKS TO KEEP YOUR HEAD  ABOVE YOUR HEART WHILE YOU SLEEP.   Alternative medicine . Several home remedies exist for treating GERD, but they provide only temporary relief. They include drinking baking soda (sodium bicarbonate) added to water or drinking other fluids such as baking soda mixed with cream of tartar and water.  . Although these liquids create temporary relief by neutralizing, washing away or buffering acids, eventually they aggravate the situation by adding gas and fluid to your stomach, increasing pressure and causing more acid reflux. Further, adding more sodium to your diet may increase your blood pressure and add stress to your heart, and excessive bicarbonate ingestion can alter the acid-base balance in your body.   . Low-Fat Diet . BREADS, CEREALS, PASTA, RICE, DRIED PEAS, AND BEANS . These products are high in carbohydrates and most are low in fat. Therefore, they can be increased in the diet as substitutes for fatty foods. They too, however, contain calories and should not be eaten in excess. Cereals can be eaten for snacks as well as for breakfast.  .  . FRUITS AND VEGETABLES . It is good to eat fruits and vegetables. Besides being sources of fiber, both are rich in vitamins and some minerals. They help you get the daily allowances of these nutrients. Fruits and vegetables can be used for snacks and  desserts. .  . MEATS . Limit lean meat, chicken, [place], and fish to no more than 6 ounces per day. . Beef, Pork, and Lamb . Use lean cuts of beef, pork, and lamb. Lean cuts include:  Marland Kitchen Extra-lean ground beef.  . Arm roast.  . Sirloin tip.  . Center-cut ham.  . Round steak.  . Loin chops.  . Rump roast.  . Tenderloin.  Marily Memos all fat off the outside of meats before cooking. It is not necessary to severely decrease the intake of red meat, but lean choices should be made. Lean meat is rich in protein and contains a highly absorbable form of iron. Premenopausal women, in particular, should avoid  reducing lean red meat because this could increase the risk for low red blood cells (iron-deficiency anemia). .  . Chicken and [place] . These are good sources of protein. The fat of poultry can be reduced by removing the skin and underlying fat layers before cooking. Chicken and [place] can be substituted for lean red meat in the diet. Poultry should not be fried or covered with high-fat sauces. . Fish and Shellfish . Fish is a good source of protein. Shellfish contain cholesterol, but they usually are low in saturated fatty acids. The preparation of fish is important. Like chicken and [place], they should not be fried or covered with high-fat sauces. . EGGS . Egg whites contain no fat or cholesterol. They can be eaten often. Try 1 to 2 egg whites instead of whole eggs in recipes or use egg substitutes that do not contain yolk. Marland Kitchen MILK AND DAIRY PRODUCTS . Use skim or 1% milk instead of 2% or whole milk. Decrease whole milk, natural, and processed cheeses. Use nonfat or low-fat (2%) cottage cheese or low-fat cheeses made from vegetable oils. Choose nonfat or low-fat (1 to 2%) yogurt. Experiment with evaporated skim milk in recipes that call for heavy cream. Substitute low-fat yogurt or low-fat cottage cheese for sour cream in dips and salad dressings. Have at least 2 servings of low-fat dairy products, such as 2 glasses of skim (or 1%) milk each day to help get your daily calcium intake. Marland Kitchen FATS AND OILS . Reduce the total intake of fats, especially saturated fat. Butterfat, lard, and beef fats are high in saturated fat and cholesterol. These should be avoided as much as possible. Vegetable fats do not contain cholesterol, but certain vegetable fats, such as coconut oil, palm oil, and palm kernel oil are very high in saturated fats. These should be limited. These fats are often used in bakery goods, processed foods, popcorn, oils, and nondairy creamers. Vegetable shortenings and some peanut butters contain  hydrogenated oils, which are also saturated fats. Read the labels on these foods and check for saturated vegetable oils. . Unsaturated vegetable oils and fats do not raise blood cholesterol. However, they should be limited because they are fats and are high in calories. Total fat should still be limited to 30% of your daily caloric intake. Desirable liquid vegetable oils are corn oil, cottonseed oil, olive oil, canola oil, safflower oil, soybean oil, and sunflower oil. Peanut oil is not as good, but small amounts are acceptable. Buy a heart-healthy tub margarine that has no partially hydrogenated oils in the ingredients. Mayonnaise and salad dressings often are made from unsaturated fats, but they should also be limited because of their high calorie and fat content. . Seeds, nuts, peanut butter, olives, and avocados are high in fat, but the fat is  mainly the unsaturated type. These foods should be limited mainly to avoid excess calories and fat. . OTHER EATING TIPS . Snacks  . Most sweets should be limited as snacks. They tend to be rich in calories and fats, and their caloric content outweighs their nutritional value. Some good choices in snacks are graham crackers, melba toast, soda crackers, bagels (no egg), English muffins, fruits, and vegetables. These snacks are preferable to snack crackers, [place] fries, TORTILLA CHIPS, and POTATO chips. Popcorn should be air-popped or cooked in small amounts of liquid vegetable oil. . Desserts . Eat fruit, low-fat yogurt, and fruit ices instead of pastries, cake, and cookies. Sherbet, angel food cake, gelatin dessert, frozen low-fat yogurt, or other frozen products that do not contain saturated fat (pure fruit juice bars, frozen ice pops) are also acceptable.  . COOKING METHODS . Choose those methods that use little or no fat. They include: . Poaching.  . Braising.  . Steaming.  Jethro Bolus.  . Baking.  . Stir-frying.  . Broiling.  . Microwaving.  . Foods  can be cooked in a nonstick pan without added fat, or use a nonfat cooking spray in regular cookware. Limit fried foods and avoid frying in saturated fat. Add moisture to lean meats by using water, broth, cooking wines, and other nonfat or low-fat sauces along with the cooking methods mentioned above. Marland Kitchen Soups and stews should be chilled after cooking. The fat that forms on top after a few hours in the refrigerator should be skimmed off. When preparing meals, avoid using excess salt. Salt can contribute to raising blood pressure in some people. .  . EATING AWAY FROM HOME . Order entres, potatoes, and vegetables without sauces or butter. When meat exceeds the size of a deck of cards (3 to 4 ounces), the rest can be taken home for another meal. . Choose vegetable or fruit salads and ask for low-calorie salad dressings to be served on the side. Use dressings sparingly. Limit high-fat toppings, such as bacon, crumbled eggs, cheese, sunflower seeds, and olives. Ask for heart-healthy tub margarine instead of butter.

## 2019-03-28 NOTE — Assessment & Plan Note (Addendum)
DIARRHEA PREDOMINANT AND EXACERBATED BY DAIRY AND FAT INTAKE. SYMPTOMS NOT IDEALLY CONTROLLED.  TO REDUCE LOOSE STOOL:   1. FOLLOW A LOW FAT DIET BECAUSE YOU DO NOT HAVE A GALLBLADDER.   2. IF YOU CONSUME DAIRY, ADD LACTASE 3 PILLS WITH MEALS UP TO THREE TIMES A DAY.   3. CHEW ONE TUMS WITH MEALS UP TO THREE TIMES A  DAY TO PREVENT DIARRHEA.  FOLLOW UP IN 6 MOS.

## 2019-03-28 NOTE — Assessment & Plan Note (Signed)
SYMPTOMS NOT CONTROLLED: OF MEDS, SMOKING 2 PK/DAY, BMI > 30, DRINKS LARGE AMOUNT OF CAFFEINE.  TO BETTER CONTROL REFLUX:   1. DRINK WATER TO KEEP YOUR URINE LIGHT YELLOW. AVOID SODA. DRINK LESS TEA.   2. AVOID REFLUX TRIGGERS.  HANDOUT GIVEN.   3. FOLLOW A LOW FAT DIET.  HANDOUT GIVEN.   4. CONTINUE YOUR WEIGHT LOSS EFFORTS.   5. RE-START DEXLANT. RX SENT. FOLLOW UP IN 6 MOS.

## 2019-03-28 NOTE — Assessment & Plan Note (Signed)
ASSOCIATED WITH A BULGE IN HER ABDOMINAL WALL AND MAY BE DUE TO A HERNIA. HAVING PAIN EVERY DAY. SYMPTOMS NOT CONTROLLED.  SEE DR. Arnoldo Morale TO DISCUSS THE BENEFITS V. RISKS OF SURGERY. FOLLOW UP IN 6 MOS.

## 2019-03-28 NOTE — Progress Notes (Signed)
Subjective:    Patient ID: Katelyn Kelly, female    DOB: [birth year], 58 y.o.   MRN: [id number]   Primary Care Physician:  Denyce Robert, FNP  Primary GI:  Barney Drain, MD   Patient Location: home   Provider Location: [facility] office   Reason for Visit:  ABDOMINAL PAIN   Persons present on the virtual encounter, with roles: patient, myself (provider), MARTINA BOOTH CMA (update meds/allergies)   Total time (minutes) spent on medical discussion:  25 MINUTES   Due to COVID-19, visit was VIA TELEPHONE VISIT DUE TO COVID 19. VISIT IS CONDUCTED VIRTUALLY AND WAS REQUESTED BY PATIENT.   Virtual Visit via TELEPHONE   I connected with  Sariah Swartzendruber  and verified that I am speaking with the correct person using two identifiers.   I discussed the limitations, risks, security and privacy concerns of performing an evaluation and management service by telephone/video and the availability of in person appointments. I also discussed with the patient that there may be a patient responsible charge related to this service. The patient expressed understanding and agreed to proceed.  HPI FEELS HERNIA PAIN LIKE EVERY DAY(MILD, TENDER TO TOUCH, FEELS LIKE FOOD LANDS UNDER HE BREASTS AND DOESN'T GO ANYWHERE. DOWN MIDDLE OF STOMACH BELOW BREASTS.  OFF OMEPRAZOLE OR DEXILANT FOR A COUPLE OF MOS. USING TUMS INSTEAD. MAY FEEL WATER SITS IN ESOPHAGUS. A LOT OF NAUSEA. BMs: DIARRHEA(3-4 TIMES A DAY, WATERY/LOOSE). MILK: NOT A LOT(RARE), CHEESE: LOTS OF IT, ICE CREAM: NOT REALLY. DINNER: BLACK BEANS AND CORN. EAT OUT: VERY RARE. COFFEE: 2-3 DAYS A WEEK. SODA: 12 PACK DIET COKE/PEPSI. HALF A GALLON OF TEA. SELDOM CHEST PAIN OR SOB. STILL SMOKING(2 PKS/DAY).   PT DENIES FEVER, CHILLS, HEMATOCHEZIA, HEMATEMESIS, vomiting, melena, CHANGE IN BOWEL IN HABITS, constipation, abdominal pain, OR problems swallowing. HAS HOT FLASHES.  Past Medical History:  Diagnosis Date  . Anxiety and depression   . Bilateral  carpal tunnel syndrome   . Chronic back pain   . Chronic neck pain   . Depression   . Diabetes mellitus   . GERD (gastroesophageal reflux disease)   . H/O syncope   . Headache   . Hypertension   . IBS (irritable bowel syndrome)   . Manic depression ([place])   . Panic attack    Past Surgical History:  Procedure Laterality Date  . ABDOMINAL HYSTERECTOMY    . ABDOMINAL SURGERY    . APPENDECTOMY    . BIOPSY  09/05/2017   Procedure: BIOPSY;  Surgeon: [name], MD;  [facility];  Service: Endoscopy;;  random colon  . BIOPSY  05/01/2018   Procedure: BIOPSY;  Surgeon: [name], MD;  [facility];  Service: Endoscopy;;  gastric biopsy  . CARPAL TUNNEL RELEASE Bilateral   . CHOLECYSTECTOMY    . COLONOSCOPY WITH PROPOFOL N/A 09/05/2017   Procedure: COLONOSCOPY WITH PROPOFOL;  Surgeon: [name], MD;  [facility]: [facility];  Service: Endoscopy;  Laterality: N/A;  10:45AM  . DILATION AND CURETTAGE OF UTERUS    . ESOPHAGOGASTRODUODENOSCOPY (EGD) WITH PROPOFOL N/A 05/01/2018   Procedure: ESOPHAGOGASTRODUODENOSCOPY (EGD) WITH PROPOFOL;  Surgeon: [name], MD;  [facility];  Service: Endoscopy;  Laterality: N/A;  10:15am  . POLYPECTOMY  09/05/2017   Procedure: POLYPECTOMY;  Surgeon: [name], MD;  [facility];  Service: Endoscopy;;  sigmoid and rectal  . TUBAL LIGATION     Allergies  Allergen Reactions  .  Sinequan [Doxepin Hcl] Anaphylaxis  . Albuterol Other (See Comments)    Patient does not remember the reaction. This was a record provided via [facility]  . Haloperidol Other (See Comments)    Patient does not remember the reaction. This was a record provided via [facility]  . Methylphenidate Derivatives Hives  . Sertraline Other (See Comments)    Patient does not remember the reaction. This was a record provided via [facility]  . Tranxene [Clorazepate Dipotassium] Hives and Swelling  . Vortioxetine  Other (See Comments)    Patient does not remember the reaction. This was a record provided via [facility]   Current Outpatient Medications  Medication Sig    . acetaminophen (TYLENOL) 500 MG tablet Take 1,000 mg by mouth every 6 (six) hours as needed for mild pain or moderate pain.     Marland Kitchen ALPRAZolam (XANAX) 0.25 MG tablet Take 1 tablet (0.25 mg total) by mouth 2 (two) times daily as needed for anxiety.    Marland Kitchen aspirin EC 81 MG tablet Take 81 mg by mouth every evening.     Marland Kitchen atorvastatin (LIPITOR) 10 MG tablet Take 10 mg by mouth every evening.     .      . diclofenac (CATAFLAM) 50 MG tablet Take 1 tablet by mouth 3 (three) times daily.    . DULoxetine (CYMBALTA) 60 MG capsule Take 2 capsules (120 mg total) by mouth daily.    . insulin lispro (HUMALOG) 100 UNIT/ML KwikPen Sliding scale as directed    . metFORMIN (GLUCOPHAGE) 500 MG tablet Take 1,000 mg by mouth 2 (two) times daily with a meal.     . pramipexole (MIRAPEX) 0.125 MG tablet Take 0.125 mg by mouth at bedtime.     Marland Kitchen PROAIR HFA 108 (90 Base) MCG/ACT inhaler Inhale 1-2 puffs into the lungs every 6 (six) hours as needed for wheezing or shortness of breath.     . TRESIBA FLEXTOUCH 100 UNIT/ML SOPN FlexTouch Pen 10 units nightly    .      . doxycycline (VIBRAMYCIN) 100 MG capsule Take 100 mg by mouth 2 (two) times daily. 5 day course starting on 11/08/2018    .      Marland Kitchen       Review of Systems PER HPI OTHERWISE ALL SYSTEMS ARE NEGATIVE.    Objective:   Physical Exam TELEPHONE VISIT DUE TO COVID 19, VISIT IS CONDUCTED VIRTUALLY AND WAS REQUESTED BY PATIENT.     Assessment & Plan:

## 2019-04-01 NOTE — Progress Notes (Signed)
cc'd to pcp 

## 2019-04-01 NOTE — Progress Notes (Signed)
ON RECALL  °

## 2019-04-11 ENCOUNTER — Other Ambulatory Visit: Payer: Self-pay

## 2019-04-11 ENCOUNTER — Encounter: Payer: Self-pay | Admitting: General Surgery

## 2019-04-11 ENCOUNTER — Ambulatory Visit (INDEPENDENT_AMBULATORY_CARE_PROVIDER_SITE_OTHER): Payer: Medicare Other | Admitting: General Surgery

## 2019-04-11 DIAGNOSIS — M6208 Separation of muscle (nontraumatic), other site: Secondary | ICD-10-CM | POA: Diagnosis not present

## 2019-04-11 NOTE — Progress Notes (Signed)
Katelyn Kelly; [id number]; 1961/01/23   HPI Patient is a 58 year old white female who was referred to my care by Dr. Oneida Alar for evaluation treatment of a ventral hernia.  Patient states that she notices a swelling in her abdomen when she is sitting up from the lying position.  It does cause her some pain.  She also has some epigastric pain soon after eating.  She has a pain level 4 out of 10.  She denies any surgery along the midline of her abdomen.  She has had an open cholecystectomy in the remote past.  She denies any fever or chills. Past Medical History:  Diagnosis Date  . Anxiety and depression   . Bilateral carpal tunnel syndrome   . Chronic back pain   . Chronic neck pain   . Depression   . Diabetes mellitus   . GERD (gastroesophageal reflux disease)   . H/O syncope   . Headache   . Hypertension   . IBS (irritable bowel syndrome)   . Manic depression ([place])   . Panic attack     Past Surgical History:  Procedure Laterality Date  . ABDOMINAL HYSTERECTOMY    . ABDOMINAL SURGERY    . APPENDECTOMY    . BIOPSY  09/05/2017   Procedure: BIOPSY;  Surgeon: [name], MD;  [facility];  Service: Endoscopy;;  random colon  . BIOPSY  05/01/2018   Procedure: BIOPSY;  Surgeon: [name], MD;  [facility];  Service: Endoscopy;;  gastric biopsy  . CARPAL TUNNEL RELEASE Bilateral   . CHOLECYSTECTOMY    . COLONOSCOPY WITH PROPOFOL N/A 09/05/2017   Procedure: COLONOSCOPY WITH PROPOFOL;  Surgeon: [name], MD;  [facility];  Service: Endoscopy;  Laterality: N/A;  10:45AM  . DILATION AND CURETTAGE OF UTERUS    . ESOPHAGOGASTRODUODENOSCOPY (EGD) WITH PROPOFOL N/A 05/01/2018   Procedure: ESOPHAGOGASTRODUODENOSCOPY (EGD) WITH PROPOFOL;  Surgeon: [name], MD;  [facility];  Service: Endoscopy;  Laterality: N/A;  10:15am  . POLYPECTOMY  09/05/2017   Procedure: POLYPECTOMY;  Surgeon: [name], MD;  [facility];   Service: Endoscopy;;  sigmoid and rectal  . TUBAL LIGATION      Family History  Problem Relation Age of Onset  . Diabetes Mother   . Stroke Mother   . Depression Mother   . Colon cancer Father 61       Passed away from colon cancer  . Colon cancer Brother 48       Passed away from colon cancer  . Bipolar disorder Son     Current Outpatient Medications on File Prior to Visit  Medication Sig Dispense Refill  . acetaminophen (TYLENOL) 500 MG tablet Take 1,000 mg by mouth every 6 (six) hours as needed for mild pain or moderate pain.     Marland Kitchen ALPRAZolam (XANAX) 0.25 MG tablet Take 1 tablet (0.25 mg total) by mouth 2 (two) times daily as needed for anxiety. 60 tablet 2  . aspirin EC 81 MG tablet Take 81 mg by mouth every evening.     Marland Kitchen atorvastatin (LIPITOR) 10 MG tablet Take 10 mg by mouth every evening.     Marland Kitchen dexlansoprazole (DEXILANT) 60 MG capsule 1 PO EVERY MORNING WITH BREAKFAST. 30 capsule 11  . diclofenac (CATAFLAM) 50 MG tablet Take 1 tablet by mouth 3 (three) times daily.    Marland Kitchen dicyclomine (BENTYL) 10 MG capsule TAKE 1 CAPSULE 4 TIMES DAILY  WITH MEALS AND AT BEDTIME. 90 capsule 3  . doxycycline (VIBRAMYCIN) 100 MG capsule Take 100 mg by mouth 2 (two) times daily. 5 day course starting on 11/08/2018    . DULoxetine (CYMBALTA) 60 MG capsule Take 2 capsules (120 mg total) by mouth daily. 180 capsule 0  . insulin lispro (HUMALOG) 100 UNIT/ML KwikPen Sliding scale as directed    . metFORMIN (GLUCOPHAGE) 500 MG tablet Take 1,000 mg by mouth 2 (two) times daily with a meal.     . methylPREDNISolone (MEDROL DOSEPAK) 4 MG TBPK tablet Take 4-24 mg by mouth See admin instructions. 6 day course starting on 11/08/2018    . metoCLOPramide (REGLAN) 10 MG tablet Take 1 tablet (10 mg total) by mouth every 6 (six) hours as needed for nausea (or headache). 6 tablet 0  . pramipexole (MIRAPEX) 0.125 MG tablet Take 0.125 mg by mouth at bedtime.     Marland Kitchen PROAIR HFA 108 (90 Base) MCG/ACT inhaler Inhale 1-2  puffs into the lungs every 6 (six) hours as needed for wheezing or shortness of breath.     . TRESIBA FLEXTOUCH 100 UNIT/ML SOPN FlexTouch Pen 10 units nightly     No current facility-administered medications on file prior to visit.     Allergies  Allergen Reactions  . Sinequan [Doxepin Hcl] Anaphylaxis  . Albuterol Other (See Comments)    Patient does not remember the reaction. This was a record provided via [facility]  . Haloperidol Other (See Comments)    Patient does not remember the reaction. This was a record provided via [facility]  . Methylphenidate Derivatives Hives  . Sertraline Other (See Comments)    Patient does not remember the reaction. This was a record provided via [facility]  . Tranxene [Clorazepate Dipotassium] Hives and Swelling  . Vortioxetine Other (See Comments)    Patient does not remember the reaction. This was a record provided via [facility]    Social History   Substance and Sexual Activity  Alcohol Use No    Social History   Tobacco Use  Smoking Status Current Every Day Smoker  . Packs/day: 1.00  . Years: 40.00  . Pack years: 40.00  . Types: Cigarettes  Smokeless Tobacco Never Used  Tobacco Comment   one pack a day    Review of Systems  Constitutional: Positive for malaise/fatigue.  HENT: Negative.   Eyes: Positive for blurred vision.  Respiratory: Positive for cough and wheezing.   Cardiovascular: Negative.   Gastrointestinal: Positive for abdominal pain, heartburn and nausea.  Genitourinary: Positive for frequency.  Musculoskeletal: Positive for back pain.  Skin: Negative.   Neurological: Positive for headaches.  Endo/Heme/Allergies: Negative.   Psychiatric/Behavioral: Negative.     Objective   Vitals:   04/11/19 0953  BP: 131/74  Pulse: 95  Resp: 16  Temp: (!) 97.5 F (36.4 C)  SpO2: 93%    Physical Exam Vitals signs reviewed.  Constitutional:      Appearance: Normal appearance. She is not  ill-appearing.  HENT:     Head: Normocephalic and atraumatic.  Cardiovascular:     Rate and Rhythm: Normal rate and regular rhythm.     Heart sounds: Normal heart sounds. No murmur. No friction rub. No gallop.   Pulmonary:     Effort: Pulmonary effort is normal. No respiratory distress.     Breath sounds: Normal breath sounds. No stridor. No wheezing or rhonchi.  Abdominal:     General: Abdomen is flat. Bowel sounds are  normal. There is no distension.     Palpations: Abdomen is soft. There is no mass.     Tenderness: There is no abdominal tenderness. There is no guarding or rebound.     Hernia: No hernia is present.     Comments: Right subcostal surgical scar well-healed without hernia.  Patient has a diastases recti.  Skin:    General: Skin is warm and dry.  Neurological:     Mental Status: She is alert and oriented to person, place, and time.     Assessment  Diastases recti Plan   Reassured patient that she does not have a hernia.  Would not recommend surgery for the diastases recti.  Toning of abdominal wall and weight loss have been recommended to the patient.  Literature was given.  She was grateful that this did not require surgical intervention.  Follow-up as needed.

## 2019-04-11 NOTE — Patient Instructions (Signed)
Diastasis Recti  Diastasis recti is when the muscles of the abdomen (rectus abdominis muscles) become thin and separate. The result is a wider space between the right and left abdomen (abdominal) muscles. This wider space between the muscles may cause a bulge in the middle of your abdomen. You may notice this bulge when you are straining or when you sit up from a lying down position. Diastasis recti can affect men and women. It is most common among pregnant women, infants, people who are obese, and people who have had abdominal surgery. Exercise or surgical treatment may help correct it. What are the causes? Common causes of this condition include:  Pregnancy. The growing uterus puts pressure on the abdominal muscles, which causes the muscles to separate.  Obesity. Excess fat puts pressure on abdominal muscles.  Weightlifting.  Some abdomen exercises.  Advanced age.  Genetics.  Prior abdominal surgery. What increases the risk? This condition is more likely to develop in:  Women.  Newborns, especially newborns who are born early (prematurely). What are the signs or symptoms? Common symptoms of this condition include:  A bulge in the middle of the abdomen. You will notice it most when you sit up or strain.  Pain in the low back, pelvis, or hips.  Constipation.  Inability to control when you urinate (urinary incontinence).  Bloating.  Poor posture. How is this diagnosed? This condition is diagnosed with a physical exam. Your health care provider will ask you to lie flat on your back and do a crunch or half sit-up. If you have diastasis recti, a vertical bulge will appear between your abdominal muscles in the center of your abdomen. Your health care provider will measure the gap between your muscles with one of the following:  A medical device used to measure the space between two objects (caliper).  A tape measure.  CT scan.  Ultrasound.  Finger spaces. Your health  care provider will measure the space using their fingers. How is this treated? If your muscle separation is not too large, you may not need treatment. However, if you are a woman who plans to become pregnant again, you should treat this condition before your next pregnancy. Treatment may include:  Physical therapy to strengthen and tighten your abdominal muscles.  Lifestyle changes such as weight loss and exercise.  Over-the-counter pain medicines as needed.  Surgery to correct the separation. Follow these instructions at home: Activity  Return to your normal activities as told by your health care provider. Ask your health care provider what activities are safe for you.  When lifting weights or doing exercises using your abdominal muscles or the muscles in the center of your body that give stability (core muscles), make sure you are doing your exercises and movements correctly. Proper form can help to prevent the condition from happening again. General instructions  If you are overweight, ask your health care provider for help with weight loss. Losing even a small amount of weight can help to improve your diastasis recti.  Take over-the-counter or prescription medicines only as told by your health care provider.  Do not strain. Straining can make the separation worse. Examples of straining include: ? Pushing hard to have a bowel movement, such as due to constipation. ? Lifting heavy objects, including children. ? Standing up and sitting down.  Take steps to prevent constipation: ? Drink enough fluid to keep your urine clear or pale yellow. ? Take over-the-counter or prescription medicines only as directed. ? Eat foods   that are high in fiber, such as fresh fruits and vegetables, whole grains, and beans. ? Limit foods that are high in fat and processed sugars, such as fried and sweet foods. Contact a health care provider if:  You notice a new bulge in your abdomen. Get help right  away if:  You experience severe discomfort in your abdomen.  You develop severe abdominal pain along with nausea, vomiting, or fever. Summary  Diastasis recti is when the abdomen (abdominal) muscles become thin and separate. Your abdomen will stick out because the space between your right and left abdomen muscles has widened.  The most common symptom is a bulge in your abdomen. You will notice it most when you sit up or are straining.  This condition is diagnosed during a physical exam.  If the abdomen separation is not too big, you may choose not to have treatment. Otherwise, you may need to undergo physical therapy or surgery. This information is not intended to replace advice given to you by your health care provider. Make sure you discuss any questions you have with your health care provider. Document Released: 12/12/2016 Document Revised: 12/12/2016 Document Reviewed: 12/12/2016 Elsevier Interactive Patient Education  2019 [employer]  

## 2019-04-17 DIAGNOSIS — M545 Low back pain: Secondary | ICD-10-CM | POA: Diagnosis not present

## 2019-04-17 DIAGNOSIS — M5441 Lumbago with sciatica, right side: Secondary | ICD-10-CM | POA: Diagnosis not present

## 2019-04-17 DIAGNOSIS — M5442 Lumbago with sciatica, left side: Secondary | ICD-10-CM | POA: Diagnosis not present

## 2019-04-17 DIAGNOSIS — G43009 Migraine without aura, not intractable, without status migrainosus: Secondary | ICD-10-CM | POA: Diagnosis not present

## 2019-04-19 ENCOUNTER — Ambulatory Visit (HOSPITAL_COMMUNITY)
Admission: RE | Admit: 2019-04-19 | Discharge: 2019-04-19 | Disposition: A | Discharge status: Home / Self Care | Payer: Medicare Other | Source: Ambulatory Visit | Attending: Adult Health | Admitting: Adult Health

## 2019-04-19 ENCOUNTER — Other Ambulatory Visit (HOSPITAL_COMMUNITY): Payer: Self-pay | Admitting: Adult Health

## 2019-04-19 ENCOUNTER — Other Ambulatory Visit: Payer: Self-pay

## 2019-04-19 DIAGNOSIS — M5442 Lumbago with sciatica, left side: Secondary | ICD-10-CM

## 2019-04-19 DIAGNOSIS — M4807 Spinal stenosis, lumbosacral region: Secondary | ICD-10-CM | POA: Diagnosis not present

## 2019-04-19 DIAGNOSIS — M48061 Spinal stenosis, lumbar region without neurogenic claudication: Secondary | ICD-10-CM | POA: Diagnosis not present

## 2019-04-19 DIAGNOSIS — M47817 Spondylosis without myelopathy or radiculopathy, lumbosacral region: Secondary | ICD-10-CM | POA: Diagnosis not present

## 2019-04-24 DIAGNOSIS — M1990 Unspecified osteoarthritis, unspecified site: Secondary | ICD-10-CM | POA: Diagnosis not present

## 2019-04-24 DIAGNOSIS — M545 Low back pain: Secondary | ICD-10-CM | POA: Diagnosis not present

## 2019-04-24 DIAGNOSIS — M5442 Lumbago with sciatica, left side: Secondary | ICD-10-CM | POA: Diagnosis not present

## 2019-04-24 DIAGNOSIS — E1165 Type 2 diabetes mellitus with hyperglycemia: Secondary | ICD-10-CM | POA: Diagnosis not present

## 2019-04-25 ENCOUNTER — Encounter (HOSPITAL_COMMUNITY): Payer: Self-pay | Admitting: Psychiatry

## 2019-04-25 ENCOUNTER — Ambulatory Visit (INDEPENDENT_AMBULATORY_CARE_PROVIDER_SITE_OTHER): Payer: Medicare Other | Admitting: Psychiatry

## 2019-04-25 ENCOUNTER — Other Ambulatory Visit: Payer: Self-pay

## 2019-04-25 DIAGNOSIS — F331 Major depressive disorder, recurrent, moderate: Secondary | ICD-10-CM | POA: Diagnosis not present

## 2019-04-25 DIAGNOSIS — F4310 Post-traumatic stress disorder, unspecified: Secondary | ICD-10-CM

## 2019-04-25 MED ORDER — DULOXETINE HCL 60 MG PO CPEP: 120.0000 mg | ORAL_CAPSULE | Freq: Every day | ORAL | 0 refills | Status: DC

## 2019-04-25 MED ORDER — ALPRAZOLAM 0.25 MG PO TABS: 0.2500 mg | ORAL_TABLET | Freq: Two times a day (BID) | ORAL | 1 refills | Status: DC | PRN

## 2019-04-25 NOTE — Patient Instructions (Signed)
1. Continue duloxetine 120 mg daily  2.Continue Xanax 0.25 mg twice a day for anxiety   3. Next appointment: 8/20 at 10:40 f
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
## 2019-06-10 DIAGNOSIS — M5136 Other intervertebral disc degeneration, lumbar region: Secondary | ICD-10-CM | POA: Diagnosis not present

## 2019-06-10 DIAGNOSIS — M5441 Lumbago with sciatica, right side: Secondary | ICD-10-CM | POA: Diagnosis not present

## 2019-06-10 DIAGNOSIS — M1990 Unspecified osteoarthritis, unspecified site: Secondary | ICD-10-CM | POA: Diagnosis not present

## 2019-06-10 DIAGNOSIS — M5442 Lumbago with sciatica, left side: Secondary | ICD-10-CM | POA: Diagnosis not present

## 2019-06-20 ENCOUNTER — Ambulatory Visit (HOSPITAL_COMMUNITY): Payer: Medicare Other | Admitting: Psychiatry

## 2019-07-01 ENCOUNTER — Other Ambulatory Visit (HOSPITAL_COMMUNITY): Payer: Self-pay | Admitting: Family Medicine

## 2019-07-01 DIAGNOSIS — M545 Low back pain, unspecified: Secondary | ICD-10-CM

## 2019-07-01 DIAGNOSIS — M5136 Other intervertebral disc degeneration, lumbar region: Secondary | ICD-10-CM

## 2019-07-10 ENCOUNTER — Encounter (HOSPITAL_COMMUNITY): Payer: Self-pay

## 2019-07-10 ENCOUNTER — Ambulatory Visit (HOSPITAL_COMMUNITY): Payer: Medicare Other

## 2019-07-14 ENCOUNTER — Emergency Department (HOSPITAL_COMMUNITY)
Admission: EM | Admit: 2019-07-14 | Discharge: 2019-07-14 | Disposition: A | Discharge status: Home / Self Care | Payer: Medicare Other | Attending: Emergency Medicine | Admitting: Emergency Medicine

## 2019-07-14 ENCOUNTER — Other Ambulatory Visit: Payer: Self-pay

## 2019-07-14 ENCOUNTER — Encounter (HOSPITAL_COMMUNITY): Payer: Self-pay | Admitting: Emergency Medicine

## 2019-07-14 DIAGNOSIS — I10 Essential (primary) hypertension: Secondary | ICD-10-CM | POA: Insufficient documentation

## 2019-07-14 DIAGNOSIS — M545 Low back pain, unspecified: Secondary | ICD-10-CM

## 2019-07-14 DIAGNOSIS — F17210 Nicotine dependence, cigarettes, uncomplicated: Secondary | ICD-10-CM | POA: Insufficient documentation

## 2019-07-14 DIAGNOSIS — Z794 Long term (current) use of insulin: Secondary | ICD-10-CM | POA: Insufficient documentation

## 2019-07-14 DIAGNOSIS — Z79899 Other long term (current) drug therapy: Secondary | ICD-10-CM | POA: Insufficient documentation

## 2019-07-14 DIAGNOSIS — E119 Type 2 diabetes mellitus without complications: Secondary | ICD-10-CM | POA: Insufficient documentation

## 2019-07-14 DIAGNOSIS — Z7982 Long term (current) use of aspirin: Secondary | ICD-10-CM | POA: Diagnosis not present

## 2019-07-14 DIAGNOSIS — G8929 Other chronic pain: Secondary | ICD-10-CM

## 2019-07-14 DIAGNOSIS — M5441 Lumbago with sciatica, right side: Secondary | ICD-10-CM | POA: Diagnosis not present

## 2019-07-14 DIAGNOSIS — M5431 Sciatica, right side: Secondary | ICD-10-CM | POA: Diagnosis not present

## 2019-07-14 MED ORDER — LIDOCAINE 5 % EX PTCH
1.0000 | MEDICATED_PATCH | CUTANEOUS | Status: DC
  Administered 2019-07-14: 1 via TRANSDERMAL
  Filled 2019-07-14: qty 1

## 2019-07-14 MED ORDER — KETOROLAC TROMETHAMINE 60 MG/2ML IM SOLN
15.0000 mg | Freq: Once | INTRAMUSCULAR | Status: AC
  Administered 2019-07-14: 15 mg via INTRAMUSCULAR
  Filled 2019-07-14: qty 2

## 2019-07-14 MED ORDER — LIDOCAINE 5 % EX PTCH: 1.0000 | MEDICATED_PATCH | CUTANEOUS | 0 refills | Status: DC

## 2019-07-14 MED ORDER — METHOCARBAMOL 500 MG PO TABS
500.0000 mg | ORAL_TABLET | Freq: Once | ORAL | Status: AC
  Administered 2019-07-14: 500 mg via ORAL
  Filled 2019-07-14: qty 1

## 2019-07-14 MED ORDER — PREDNISONE 20 MG PO TABS
40.0000 mg | ORAL_TABLET | Freq: Once | ORAL | Status: AC
  Administered 2019-07-14: 40 mg via ORAL
  Filled 2019-07-14: qty 2

## 2019-07-14 MED ORDER — HYDROCODONE-ACETAMINOPHEN 5-325 MG PO TABS
2.0000 | ORAL_TABLET | Freq: Once | ORAL | Status: AC
  Administered 2019-07-14: 2 via ORAL
  Filled 2019-07-14: qty 2

## 2019-07-14 MED ORDER — METHOCARBAMOL 500 MG PO TABS: 500.0000 mg | ORAL_TABLET | Freq: Two times a day (BID) | ORAL | 0 refills | Status: AC

## 2019-07-14 NOTE — ED Provider Notes (Signed)
[facility] EMERGENCY DEPARTMENT Provider Note   CSN: [id number] Arrival date & time: 07/14/19  1054     History   Chief Complaint Chief Complaint  Patient presents with  . Back Pain    HPI FATMEH GAGO is a 58 y.o. female with history of chronic back pain, chronic neck pain, diabetes on insulin, depression/anxiety, GERD presents today with exacerbation of her back pain.  Patient reports increasing right lower back pain for the past 4 months she describes a severe intensity sharp aching sensation that radiates from her right lower back down her right outer leg, pain is constant worsened with movement and bending and without alleviating factors.  She has been taking aspirin and Tylenol without relief.  She reports that she is currently in between primary care physicians and has a MRI scheduled for next week.  Patient denies fever/chills, headache/vision changes, neck pain, chest pain/shortness of breath, cough, abdominal pain, nausea/vomiting, dysuria/hematuria, bowel/bladder incontinence, urinary retention, saddle area paresthesias, IV drug use, history of cancer, fall/injury or any additional concerns.     HPI  Past Medical History:  Diagnosis Date  . Anxiety and depression   . Bilateral carpal tunnel syndrome   . Chronic back pain   . Chronic neck pain   . Depression   . Diabetes mellitus   . GERD (gastroesophageal reflux disease)   . H/O syncope   . Headache   . Hypertension   . IBS (irritable bowel syndrome)   . Manic depression ([place])   . Panic attack     Patient Active Problem List   Diagnosis Date Noted  . Diastasis recti 04/11/2019  . Intermittent upper abdominal pain 03/28/2019  . MDD (major depressive disorder), recurrent episode, moderate ([place]) 11/26/2018  . PTSD (post-traumatic stress disorder) 11/26/2018  . Nausea with vomiting 02/14/2018  . GERD (gastroesophageal reflux disease) 11/08/2017  . Dysphagia 11/08/2017  . Taking multiple medications for  chronic disease 08/02/2017  . Family history of colon cancer 08/02/2017  . IBS (irritable bowel syndrome) 08/02/2017  . LATERAL EPICONDYLITIS 08/27/2009  . JOINT PAIN, HAND 07/30/2009  . TRIGGER FINGER 07/30/2009  . CARPAL TUNNEL SYNDROME 03/04/2009  . CERVICAL RADICULITIS 03/04/2009  . NUMBNESS, HAND 02/02/2009    Past Surgical History:  Procedure Laterality Date  . ABDOMINAL HYSTERECTOMY    . ABDOMINAL SURGERY    . APPENDECTOMY    . BIOPSY  09/05/2017   Procedure: BIOPSY;  Surgeon: [name], MD;  Location: [facility];  Service: Endoscopy;;  random colon  . BIOPSY  05/01/2018   Procedure: BIOPSY;  Surgeon: [name], MD;  Location: [facility];  Service: Endoscopy;;  gastric biopsy  . CARPAL TUNNEL RELEASE Bilateral   . CHOLECYSTECTOMY    . COLONOSCOPY WITH PROPOFOL N/A 09/05/2017   Procedure: COLONOSCOPY WITH PROPOFOL;  Surgeon: [name], MD;  Location: [facility];  Service: Endoscopy;  Laterality: N/A;  10:45AM  . DILATION AND CURETTAGE OF UTERUS    . ESOPHAGOGASTRODUODENOSCOPY (EGD) WITH PROPOFOL N/A 05/01/2018   Procedure: ESOPHAGOGASTRODUODENOSCOPY (EGD) WITH PROPOFOL;  Surgeon: [name], MD;  Location: [facility];  Service: Endoscopy;  Laterality: N/A;  10:15am  . POLYPECTOMY  09/05/2017   Procedure: POLYPECTOMY;  Surgeon: [name], MD;  Location: [facility];  Service: Endoscopy;;  sigmoid and rectal  . TUBAL LIGATION       OB History    Gravida  4   Para  2   Term  2   Preterm      AB  2   Living  2     SAB  2   TAB      Ectopic      Multiple      Live Births               Home Medications    Prior to Admission medications   Medication Sig Start Date End Date Taking? Authorizing Provider  acetaminophen (TYLENOL) 500 MG tablet Take 1,000 mg by mouth every 6 (six) hours as needed for mild pain or moderate pain.     [provider]  ALPRAZolam Duanne Moron) 0.25 MG tablet Take 1 tablet (0.25 mg total)  by mouth 2 (two) times daily as needed for anxiety. 04/25/19   [name], MD  aspirin EC 81 MG tablet Take 81 mg by mouth every evening.     [provider]  atorvastatin (LIPITOR) 10 MG tablet Take 10 mg by mouth every evening.     [provider]  dexlansoprazole (DEXILANT) 60 MG capsule 1 PO EVERY MORNING WITH BREAKFAST. 03/28/19   Fields, Marga Melnick, MD  diclofenac (CATAFLAM) 50 MG tablet Take 1 tablet by mouth 3 (three) times daily. 02/18/19   [provider]  dicyclomine (BENTYL) 10 MG capsule TAKE 1 CAPSULE 4 TIMES DAILY WITH MEALS AND AT BEDTIME. 11/27/18   Carlis Stable, NP  doxycycline (VIBRAMYCIN) 100 MG capsule Take 100 mg by mouth 2 (two) times daily. 5 day course starting on 11/08/2018 11/08/18   [provider]  DULoxetine (CYMBALTA) 60 MG capsule Take 2 capsules (120 mg total) by mouth daily. 04/25/19   [name], MD  insulin lispro (HUMALOG) 100 UNIT/ML KwikPen Sliding scale as directed 03/09/19   [provider]  lidocaine (LIDODERM) 5 % Place 1 patch onto the skin daily. Remove & Discard patch within 12 hours or as directed by MD 07/14/19   Deliah Boston, PA-C  metFORMIN (GLUCOPHAGE) 500 MG tablet Take 1,000 mg by mouth 2 (two) times daily with a meal.     [provider]  methocarbamol (ROBAXIN) 500 MG tablet Take 1 tablet (500 mg total) by mouth 2 (two) times daily for 10 days. 07/14/19 07/24/19  Nuala Alpha A, PA-C  methylPREDNISolone (MEDROL DOSEPAK) 4 MG TBPK tablet Take 4-24 mg by mouth See admin instructions. 6 day course starting on 11/08/2018 11/08/18   [provider]  metoCLOPramide (REGLAN) 10 MG tablet Take 1 tablet (10 mg total) by mouth every 6 (six) hours as needed for nausea (or headache). 10/13/18   Francine Graven, DO  pramipexole (MIRAPEX) 0.125 MG tablet Take 0.125 mg by mouth at bedtime.  05/28/18   [provider]  PROAIR HFA 108 (90 Base) MCG/ACT inhaler Inhale 1-2 puffs into the lungs  every 6 (six) hours as needed for wheezing or shortness of breath.  11/07/18   [provider]  TRESIBA FLEXTOUCH 100 UNIT/ML SOPN FlexTouch Pen 10 units nightly 03/20/19   [provider]    Family History Family History  Problem Relation Age of Onset  . Diabetes Mother   . Stroke Mother   . Depression Mother   . Colon cancer Father 45       Passed away from colon cancer  . Colon cancer Brother 29       Passed away from colon cancer  . Bipolar disorder Son     Social History Social History   Tobacco Use  .  Smoking status: Current Every Day Smoker    Packs/day: 1.00    Years: 40.00    Pack years: 40.00    Types: Cigarettes  . Smokeless tobacco: Never Used  . Tobacco comment: one pack a day  Substance Use Topics  . Alcohol use: No  . Drug use: No     Allergies   Sinequan [doxepin hcl], Albuterol, Haloperidol, Methylphenidate derivatives, Sertraline, Tranxene [clorazepate dipotassium], and Vortioxetine   Review of Systems Review of Systems Ten systems are reviewed and are negative for acute change except as noted in the HPI   Physical Exam Updated Vital Signs BP (!) 148/81 (BP Location: Right Arm)   Pulse 100   Temp 98 F (36.7 C) (Oral)   Resp 20   Ht 5\' 1"  (1.549 m)   Wt 87.1 kg   SpO2 98%   BMI 36.28 kg/m   Physical Exam Constitutional:      General: She is not in acute distress.    Appearance: Normal appearance. She is obese. She is not ill-appearing or diaphoretic.  HENT:     Head: Normocephalic and atraumatic. No raccoon eyes or Battle's sign.     Jaw: There is normal jaw occlusion. No trismus.     Right Ear: External ear normal.     Left Ear: External ear normal.     Nose: Nose normal.     Mouth/Throat:     Mouth: Mucous membranes are moist.     Pharynx: Oropharynx is clear.  Eyes:     General: Vision grossly intact. Gaze aligned appropriately.     Conjunctiva/sclera: Conjunctivae normal.     Pupils: Pupils are equal, round,  and reactive to light.  Neck:     Musculoskeletal: Normal range of motion and neck supple.     Trachea: Trachea and phonation normal. No tracheal tenderness or tracheal deviation.  Cardiovascular:     Rate and Rhythm: Normal rate and regular rhythm.     Pulses:          Radial pulses are 2+ on the right side and 2+ on the left side.       Dorsalis pedis pulses are 2+ on the right side and 2+ on the left side.  Pulmonary:     Effort: Pulmonary effort is normal. No respiratory distress.     Breath sounds: Normal breath sounds and air entry.  Abdominal:     General: There is no distension.     Palpations: Abdomen is soft. There is no pulsatile mass.     Tenderness: There is no abdominal tenderness. There is no guarding or rebound.  Musculoskeletal:       Back:     Comments: No midline C/T/L spinal tenderness to palpation no deformity, crepitus, or step-off noted. No sign of injury to the neck or back.  Reproducible tenderness of the right lower lumbar musculature and right gluteus medius/maximus.  Positive straight right leg raise.  Feet:     Right foot:     Protective Sensation: 3 sites tested. 3 sites sensed.     Left foot:     Protective Sensation: 3 sites tested. 3 sites sensed.  Skin:    General: Skin is warm and dry.     Capillary Refill: Capillary refill takes less than 2 seconds.  Neurological:     Mental Status: She is alert.     GCS: GCS eye subscore is 4. GCS verbal subscore is 5. GCS motor subscore is 6.  Comments: Speech is clear and goal oriented, follows commands Major Cranial nerves without deficit, no facial droop Normal strength in upper and lower extremities bilaterally including dorsiflexion and plantar flexion, strong and equal grip strength Sensation normal to light and sharp touch Moves extremities without ataxia, coordination intact DTR 2+ bilateral patella, no clonus of the feet  Psychiatric:        Behavior: Behavior is cooperative.      ED  Treatments / Results  Labs (all labs ordered are listed, but only abnormal results are displayed) Labs Reviewed - No data to display  EKG None  Radiology No results found.  Procedures Procedures (including critical care time)  Medications Ordered in ED Medications  lidocaine (LIDODERM) 5 % 1 patch (1 patch Transdermal Patch Applied 07/14/19 1237)  ketorolac (TORADOL) injection 15 mg (15 mg Intramuscular Given 07/14/19 1231)  methocarbamol (ROBAXIN) tablet 500 mg (500 mg Oral Given 07/14/19 1231)  predniSONE (DELTASONE) tablet 40 mg (40 mg Oral Given 07/14/19 1231)  HYDROcodone-acetaminophen (NORCO/VICODIN) 5-325 MG per tablet 2 tablet (2 tablets Oral Given 07/14/19 1231)     Initial Impression / Assessment and Plan / ED Course  I have reviewed the triage vital signs and the nursing notes.  Pertinent labs & imaging results that were available during my care of the patient were reviewed by me and considered in my medical decision making (see chart for details).  ZYIAH GELPI is a 58 y.o. female presenting with a four-month exacerbation of her chronic back pain, right-sided with right-sided sciatica. Patient denies history of trauma, fever, IV drug use, night sweats, weight loss, cancer, saddle anesthesia, urinary rentention, bowel/bladder incontinence. No neurological deficits and normal neuro exam.   Suspect musculoskeletal etiology of patient's pain. Pain is consistently reproducible with palpation of the back musculature and right straight leg raise. Abdomen soft/nontender and without pulsatile mass. Patient with equal pedal pulses. Doubt spinal epidural abscess, cauda equina, AAA or other emergent pathologies at this time.  No imaging indicated at this time.  Patient is ambulatory in the emergency department without assistance. RICE protocol and pain medicine indicated and discussed with patient.   Toradol 15 mg IM given. Patient denies history of CKD or gastric  ulcers/bleeding. Robaxin 500mg  BID prescribed. Patient informed to avoid driving or operating heavy machinery while taking muscle relaxer. Lidoderm patch prescribed for comfort. Patient given 2 Norco here for acute back pain, she has a ride home from the ED, she states understanding of narcotic precautions today.  Long discussion was held with patient regarding benefits of prednisone in regards to her sciatica and the risks pertaining to her history of diabetes and use of insulin.  Patient has asked to be started on prednisone today for treatment of her acute on chronic right lower back pain with sciatica, she has been advised to monitor her blood sugars closely and treat accordingly with her home diabetic medications as she has been previously instructed by her primary care provider, she states understanding of plan of care and is agreeable.  Informed by nursing staff that patient is now requesting to leave.  On reevaluation she reports improvement of her symptoms.  At this time there does not appear to be any evidence of an acute emergency medical condition and the patient appears stable for discharge with appropriate outpatient follow up. Diagnosis was discussed with patient who verbalizes understanding of care plan and is agreeable to discharge. I have discussed return precautions with patient and who verbalizes understanding of return precautions.  Patient encouraged to follow-up with their PCP. All questions answered. Patient has been discharged in good condition.  Patient's case discussed with Dr. Thurnell Garbe, advises to avoid additional prednisone after single dose here in ED. we will continue to treat with muscle relaxers, Lidoderm patches and to have PCP follow-up.  Note: Portions of this report may have been transcribed using voice recognition software. Every effort was made to ensure accuracy; however, inadvertent computerized transcription errors may still be present. Final Clinical  Impressions(s) / ED Diagnoses   Final diagnoses:  Acute exacerbation of chronic low back pain  Sciatica of right side    ED Discharge Orders         Ordered    methocarbamol (ROBAXIN) 500 MG tablet  2 times daily     07/14/19 1323    lidocaine (LIDODERM) 5 %  Every 24 hours     07/14/19 1323           Gari Crown 07/14/19 [place], [place], DO 07/16/19 1105

## 2019-07-14 NOTE — ED Triage Notes (Signed)
Patient c/o mid back pain that radiates down to lower back x3 months. Denies any injury. Per patient seen by PCP and had MRI ordered for last Wednesday but MRI machine was down and now she is waiting for them to call here back. Denies any complications with BMs or incontinency. CNS intact. Patient taking tylenol for pain with no relief.

## 2019-07-14 NOTE — Discharge Instructions (Signed)
You have been diagnosed today with acute on chronic right low back pain with right-sided sciatica.  At this time there does not appear to be the presence of an emergent medical condition, however there is always the potential for conditions to change. Please read and follow the below instructions.  Please return to the Emergency Department immediately for any new or worsening symptoms. Please be sure to follow up with your Primary Care Provider within one week regarding your visit today; please call their office to schedule an appointment even if you are feeling better for a follow-up visit. You have been given an NSAID-containing medication called Toradol today.  Do not take the medications including ibuprofen, Aleve, Advil, naproxen or other NSAID-containing medications for the next 2 days.  Please be sure to drink plenty of water over the next few days. You may use the muscle relaxer Robaxin as prescribed to help with your symptoms.  Do not drive or operate heavy machinery while taking Robaxin as it will make you drowsy.  Do not drink alcohol or take other sedating medications while taking Robaxin as this will worsen side effects. You have been given a medication prednisone as prescribed to help with your sciatica symptoms.  As we discussed steroid medications such as prednisone will raise your blood sugar levels.  Please monitor your blood sugar levels closely over the next few days and adjust your insulin accordingly. You have been given pain medication today called Norco, this medication will make you drowsy.  Do not drive or operate heavy machinery or perform other dangerous activities for the rest the day to avoid injury of your self or others.  Get help right away if: You cannot control when you pee (urinate) or poop (have a bowel movement). You have weakness in any of these areas and it gets worse: Lower back. The area between your hip bones. Butt. Legs. You have redness or swelling of  your back. You have a burning feeling when you pee. You become unresponsive. If this happens, someone else should call emergency services (911 in the [place] right away. Your blood glucose is lower than 54 mg/dL (3.0 mmol/L). You become confused or you have trouble thinking clearly. You have difficulty breathing. You have any new/concerning or worsening symptoms   Please read the additional information packets attached to your discharge summary.  Do not take your medicine if  develop an itchy rash, swelling in your mouth or lips, or difficulty breathing; call 911 and seek immediate emergency medical attention if this occurs.

## 2019-07-14 NOTE — ED Notes (Signed)
Pt reports she was scheduled to have an MRI this past week but the "machine was down"  MRI ordered by another physician but she reports she is changing doctors   She reports pain mid back with pain to R hip denies incontinence
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
## 2019-07-29 ENCOUNTER — Other Ambulatory Visit: Payer: Self-pay

## 2019-07-29 ENCOUNTER — Encounter (HOSPITAL_COMMUNITY): Payer: Self-pay | Admitting: Psychiatry

## 2019-07-29 ENCOUNTER — Ambulatory Visit (INDEPENDENT_AMBULATORY_CARE_PROVIDER_SITE_OTHER): Payer: Medicare Other | Admitting: Psychiatry

## 2019-07-29 DIAGNOSIS — F4310 Post-traumatic stress disorder, unspecified: Secondary | ICD-10-CM | POA: Diagnosis not present

## 2019-07-29 DIAGNOSIS — F331 Major depressive disorder, recurrent, moderate: Secondary | ICD-10-CM | POA: Diagnosis not present

## 2019-07-29 MED ORDER — ALPRAZOLAM 0.25 MG PO TABS: 0.2500 mg | ORAL_TABLET | Freq: Two times a day (BID) | ORAL | 3 refills | Status: DC | PRN

## 2019-07-29 MED ORDER — DULOXETINE HCL 60 MG PO CPEP: 120.0000 mg | ORAL_CAPSULE | Freq: Every day | ORAL | 1 refills | Status: DC

## 2019-07-29 NOTE — Patient Instructions (Signed)
1.Continue duloxetine 120 mg daily 2.Continue Xanax 0.25 mg twice a day as needed for anxiety 3. Next appointment:  in 3 months

## 2019-08-05 DIAGNOSIS — Z794 Long term (current) use of insulin: Secondary | ICD-10-CM | POA: Diagnosis not present

## 2019-08-05 DIAGNOSIS — E119 Type 2 diabetes mellitus without complications: Secondary | ICD-10-CM | POA: Diagnosis not present

## 2019-08-05 DIAGNOSIS — M5441 Lumbago with sciatica, right side: Secondary | ICD-10-CM | POA: Diagnosis not present

## 2019-08-05 DIAGNOSIS — Z23 Encounter for immunization: Secondary | ICD-10-CM | POA: Diagnosis not present

## 2019-08-07 ENCOUNTER — Other Ambulatory Visit: Payer: Self-pay | Admitting: Family Medicine

## 2019-08-07 DIAGNOSIS — D3501 Benign neoplasm of right adrenal gland: Secondary | ICD-10-CM

## 2019-08-07 DIAGNOSIS — K760 Fatty (change of) liver, not elsewhere classified: Secondary | ICD-10-CM

## 2019-08-07 DIAGNOSIS — R140 Abdominal distension (gaseous): Secondary | ICD-10-CM

## 2019-08-13 ENCOUNTER — Ambulatory Visit
Admission: RE | Admit: 2019-08-13 | Discharge: 2019-08-13 | Disposition: A | Discharge status: Home / Self Care | Payer: Medicare Other | Source: Ambulatory Visit | Attending: Family Medicine | Admitting: Family Medicine

## 2019-08-13 ENCOUNTER — Other Ambulatory Visit: Payer: Self-pay

## 2019-08-13 DIAGNOSIS — N200 Calculus of kidney: Secondary | ICD-10-CM | POA: Diagnosis not present

## 2019-08-13 DIAGNOSIS — D3502 Benign neoplasm of left adrenal gland: Secondary | ICD-10-CM | POA: Diagnosis not present

## 2019-08-13 DIAGNOSIS — K760 Fatty (change of) liver, not elsewhere classified: Secondary | ICD-10-CM | POA: Diagnosis not present

## 2019-08-13 DIAGNOSIS — D3501 Benign neoplasm of right adrenal gland: Secondary | ICD-10-CM | POA: Diagnosis not present

## 2019-08-13 DIAGNOSIS — R140 Abdominal distension (gaseous): Secondary | ICD-10-CM

## 2019-08-13 MED ORDER — IOPAMIDOL (ISOVUE-300) INJECTION 61%
100.0000 mL | Freq: Once | INTRAVENOUS | Status: AC | PRN
  Administered 2019-08-13: 100 mL via INTRAVENOUS

## 2019-08-19 ENCOUNTER — Encounter: Payer: Self-pay | Admitting: Gastroenterology

## 2019-10-23 DIAGNOSIS — E119 Type 2 diabetes mellitus without complications: Secondary | ICD-10-CM | POA: Diagnosis not present

## 2019-10-23 DIAGNOSIS — Z794 Long term (current) use of insulin: Secondary | ICD-10-CM | POA: Diagnosis not present

## 2019-10-23 DIAGNOSIS — G4489 Other headache syndrome: Secondary | ICD-10-CM | POA: Diagnosis not present

## 2019-10-28 ENCOUNTER — Ambulatory Visit (HOSPITAL_COMMUNITY): Payer: Medicare Other | Admitting: Psychiatry

## 2019-10-28 ENCOUNTER — Ambulatory Visit: Payer: Medicare Other | Admitting: Psychiatry

## 2019-10-31 DIAGNOSIS — E119 Type 2 diabetes mellitus without complications: Secondary | ICD-10-CM | POA: Diagnosis not present

## 2019-10-31 DIAGNOSIS — G43809 Other migraine, not intractable, without status migrainosus: Secondary | ICD-10-CM | POA: Diagnosis not present

## 2019-10-31 DIAGNOSIS — E785 Hyperlipidemia, unspecified: Secondary | ICD-10-CM | POA: Diagnosis not present

## 2019-10-31 DIAGNOSIS — G2581 Restless legs syndrome: Secondary | ICD-10-CM | POA: Diagnosis not present

## 2019-10-31 DIAGNOSIS — K760 Fatty (change of) liver, not elsewhere classified: Secondary | ICD-10-CM | POA: Diagnosis not present

## 2019-10-31 DIAGNOSIS — Z23 Encounter for immunization: Secondary | ICD-10-CM | POA: Diagnosis not present

## 2019-10-31 DIAGNOSIS — Z794 Long term (current) use of insulin: Secondary | ICD-10-CM | POA: Diagnosis not present

## 2019-11-27 ENCOUNTER — Ambulatory Visit: Payer: Medicare Other | Admitting: Psychiatry

## 2019-11-30 DIAGNOSIS — R110 Nausea: Secondary | ICD-10-CM | POA: Diagnosis not present

## 2019-11-30 DIAGNOSIS — R109 Unspecified abdominal pain: Secondary | ICD-10-CM | POA: Diagnosis not present

## 2019-12-03 DIAGNOSIS — H25812 Combined forms of age-related cataract, left eye: Secondary | ICD-10-CM | POA: Diagnosis not present

## 2019-12-03 DIAGNOSIS — E119 Type 2 diabetes mellitus without complications: Secondary | ICD-10-CM | POA: Diagnosis not present

## 2019-12-03 DIAGNOSIS — Z01818 Encounter for other preprocedural examination: Secondary | ICD-10-CM | POA: Diagnosis not present

## 2019-12-03 DIAGNOSIS — H25811 Combined forms of age-related cataract, right eye: Secondary | ICD-10-CM | POA: Diagnosis not present

## 2019-12-08 NOTE — Progress Notes (Signed)
Referring Provider: Ginny Forth* Primary Care Physician:  Pieter Partridge, PA Primary GI Physician: Dr. Oneida Alar  Chief Complaint  Patient presents with  . Abdominal Pain    Left sided pain radiates through to back,diarrhea    HPI:   Katelyn Kelly is a 59 y.o. female presenting today for follow-up and further evaluation of left sided pain.  History of IBS, GERD, cholecystectomy.  EGD in July 2019 with normal esophagus, small hiatal hernia, mild gastritis s/p biopsy, normal duodenum.  No H. pylori.  Colonoscopy up-to-date in November 2018 with normal TI, 5 small polyps, diverticulosis in the cecum, internal and external hemorrhoids.  Random colon biopsies benign.  Colon polyps were hyperplastic.  Recommended repeat colonoscopy in 5 years.  She was last seen via virtual visit on 03/28/19 for intermittent upper abdominal pain, GERD, IBS.  Reported mild tenderness to the touch and feeling like food lands under her breast and does not go anywhere.  Was off PPI and using Tums.  Diarrhea 3-4 times a day.  Was eating a lot of cheese.  To help with diarrhea, she was advised to follow a low-fat diet, use 3 Lactaid pills prior to dairy consumption up to 3 times a day, chew 1 Tums with meals up to 3 times a day.  For GERD, advised to restart Dexilant and avoid reflux triggers.  She was advised to see Dr. Arnoldo Morale to discuss benefits versus risk of surgery for possible abdominal wall hernia.  Patient saw Dr. Arnoldo Morale on 04/11/2019 and was diagnosed with diastases recti.  No recommendations for surgery.  Recommended toning abdominal wall weight loss.  Toradol helped. Started on Naprxen 500 BID x 14 days on 12/04/19  Today:  Constant left flank pain. Pain in her left back and her left mid abdomen as well. Doesn't feel like the pain is radiating. Staying in the same location. Some days worse than other. Some days will bring her to her knees and make her want to cry. Currently about 8/10 in  severity. Had shot of toradol which helped for 15 minutes. Taking Naproxen 500 mg about once a day without and improvement. Pain started 4-5 months ago. Twisting will worsen pain. No association with food or bowel movements. If walking a long time, will feel like her left leg will want to give out. Denies dysuria, hematuria, or worsening of left sided pain with urinating.  No fever or chills.   Occasional epigastric pain where her hernia is. Rarely. Less than once a week. Overall this is improved since we saw her last. Occasional lower abdominal pain prior to diarrhea but states this is less than once a week and isn't bothersome. "Just feels like you need to go to the bathroom." Was having significant nausea around 11/30/19 and vomited twice. No hematemesis. Nausea has improved. Used zofran twice last week. No further vomiting. No GERD symptoms. No dysphagia. Can't remember when she last took [place].   Takes extra strength tylenol as needed. No NSAIDs until started on naproxen.   Diarrhea: BMs typically daily to twice a day. Loose/mushy. Occasional watery stools 1-2 times a week, typically after eating. Sometimes BMs feel incomplete. Couple episodes where she passed a small amount of stool unintentionally when she passed gas.   Doesn't drink much water. Urine is pale yellow. No blood in the stool. No black stools. No unintentional weight loss. Occasional lower abdominal pain prior to a BM less than once a week. Eating cheese at times. Not taking  lactaid pills. No ice cream. No milk. Not taking tums with meals.  Trying to limit fried foods.   Past Medical History:  Diagnosis Date  . Anxiety and depression   . Bilateral carpal tunnel syndrome   . Chronic back pain   . Chronic neck pain   . Depression   . Diabetes mellitus   . GERD (gastroesophageal reflux disease)   . H/O syncope   . Headache   . Hypertension   . IBS (irritable bowel syndrome)   . Manic depression ([place])   . Panic attack      Past Surgical History:  Procedure Laterality Date  . ABDOMINAL HYSTERECTOMY    . ABDOMINAL SURGERY    . APPENDECTOMY    . BIOPSY  09/05/2017   Procedure: BIOPSY;  Surgeon: [name], MD;  [facility];  Service: Endoscopy;;  random colon  . BIOPSY  05/01/2018   Procedure: BIOPSY;  Surgeon: [name], MD;  [facility];  Service: Endoscopy;;  gastric biopsy  . CARPAL TUNNEL RELEASE Bilateral   . CHOLECYSTECTOMY    . COLONOSCOPY WITH PROPOFOL N/A 09/05/2017   Procedure: COLONOSCOPY WITH PROPOFOL;  Surgeon: [name], MD;  [facility];  Service: Endoscopy;  Laterality: N/A;  10:45AM  . DILATION AND CURETTAGE OF UTERUS    . ESOPHAGOGASTRODUODENOSCOPY (EGD) WITH PROPOFOL N/A 05/01/2018   Procedure: ESOPHAGOGASTRODUODENOSCOPY (EGD) WITH PROPOFOL;  Surgeon: [name], MD;  [facility];  Service: Endoscopy;  Laterality: N/A;  10:15am  . POLYPECTOMY  09/05/2017   Procedure: POLYPECTOMY;  Surgeon: [name], MD;  [facility];  Service: Endoscopy;;  sigmoid and rectal  . TUBAL LIGATION      Current Outpatient Medications  Medication Sig Dispense Refill  . acetaminophen (TYLENOL) 500 MG tablet Take 1,000 mg by mouth every 6 (six) hours as needed for mild pain or moderate pain.     Marland Kitchen ALPRAZolam (XANAX) 0.25 MG tablet Take 1 tablet (0.25 mg total) by mouth 2 (two) times daily as needed for anxiety. 60 tablet 3  . aspirin EC 81 MG tablet Take 81 mg by mouth every evening.     Marland Kitchen atorvastatin (LIPITOR) 10 MG tablet Take 10 mg by mouth every evening.     Marland Kitchen dexlansoprazole (DEXILANT) 60 MG capsule 1 PO EVERY MORNING WITH BREAKFAST. 30 capsule 11  . diclofenac (CATAFLAM) 50 MG tablet Take 1 tablet by mouth 3 (three) times daily.    Marland Kitchen dicyclomine (BENTYL) 10 MG capsule TAKE 1 CAPSULE 4 TIMES DAILY WITH MEALS AND AT BEDTIME. 90 capsule 3  . doxycycline (VIBRAMYCIN) 100 MG capsule Take 100 mg by mouth 2 (two) times daily. 5  day course starting on 11/08/2018    . DULoxetine (CYMBALTA) 60 MG capsule Take 2 capsules (120 mg total) by mouth daily. 180 capsule 1  . insulin lispro (HUMALOG) 100 UNIT/ML KwikPen Sliding scale as directed    . lidocaine (LIDODERM) 5 % Place 1 patch onto the skin daily. Remove & Discard patch within 12 hours or as directed by MD 30 patch 0  . metFORMIN (GLUCOPHAGE) 500 MG tablet Take 1,000 mg by mouth 2 (two) times daily with a meal.     . methylPREDNISolone (MEDROL DOSEPAK) 4 MG TBPK tablet Take 4-24 mg by mouth See admin instructions. 6 day course starting on 11/08/2018    . metoCLOPramide (REGLAN) 10 MG tablet Take 1 tablet (10 mg total) by mouth every  6 (six) hours as needed for nausea (or headache). 6 tablet 0  . pramipexole (MIRAPEX) 0.125 MG tablet Take 0.125 mg by mouth at bedtime.     Marland Kitchen PROAIR HFA 108 (90 Base) MCG/ACT inhaler Inhale 1-2 puffs into the lungs every 6 (six) hours as needed for wheezing or shortness of breath.     . TRESIBA FLEXTOUCH 100 UNIT/ML SOPN FlexTouch Pen 10 units nightly     No current facility-administered medications for this visit.    Allergies as of 12/09/2019 - Review Complete 12/09/2019  Allergen Reaction Noted  . Sinequan [doxepin hcl] Anaphylaxis 01/10/2012  . Albuterol Other (See Comments) 11/08/2017  . Haloperidol Other (See Comments) 11/08/2017  . Methylphenidate derivatives Hives 07/28/2013  . Sertraline Other (See Comments) 11/08/2017  . Tranxene [clorazepate dipotassium] Hives and Swelling 01/10/2012  . Vortioxetine Other (See Comments) 11/08/2017    Family History  Problem Relation Age of Onset  . Diabetes Mother   . Stroke Mother   . Depression Mother   . Colon cancer Father 57       Passed away from colon cancer  . Colon cancer Brother 75       Passed away from colon cancer  . Bipolar disorder Son     Social History   Socioeconomic History  . Marital status: Legally Separated    Spouse name: Not on file  . Number of  children: Not on file  . Years of education: Not on file  . Highest education level: Not on file  Occupational History  . Not on file  Tobacco Use  . Smoking status: Current Every Day Smoker    Packs/day: 1.00    Years: 40.00    Pack years: 40.00    Types: Cigarettes  . Smokeless tobacco: Never Used  . Tobacco comment: one pack a day  Substance and Sexual Activity  . Alcohol use: No  . Drug use: No  . Sexual activity: Not Currently    Birth control/protection: Surgical  Other Topics Concern  . Not on file  Social History Narrative  . Not on file   Social Determinants of Health   Financial Resource Strain:   . Difficulty of Paying Living Expenses: Not on file  Food Insecurity:   . Worried About Charity fundraiser in the Last Year: Not on file  . Ran Out of Food in the Last Year: Not on file  Transportation Needs:   . Lack of Transportation (Medical): Not on file  . Lack of Transportation (Non-Medical): Not on file  Physical Activity:   . Days of Exercise per Week: Not on file  . Minutes of Exercise per Session: Not on file  Stress:   . Feeling of Stress : Not on file  Social Connections:   . Frequency of Communication with Friends and Family: Not on file  . Frequency of Social Gatherings with Friends and Family: Not on file  . Attends Religious Services: Not on file  . Active Member of Clubs or Organizations: Not on file  . Attends Archivist Meetings: Not on file  . Marital Status: Not on file    Review of Systems: Gen: Lightheadedness, dizziness, pre-syncope, or syncope.   CV: Denies chest pain or palpitations.  Resp: Denies shortness of breath or cough.  GI: See HPI Derm: Denies rash Heme: See PHI  Physical Exam: BP (!) 142/84   Pulse (!) 101   Temp (!) 97.5 F (36.4 C) (Temporal)   Ht 5' 1.5" (  1.562 m)   Wt 185 lb 3.2 oz (84 kg)   BMI 34.43 kg/m  General:   Alert and oriented. No distress noted. Pleasant and cooperative.  Head:   Normocephalic and atraumatic. Eyes:  Conjuctiva clear without scleral icterus. Heart:  S1, S2 present without murmurs appreciated. Lungs:  Clear to auscultation bilaterally. No wheezes, rales, or rhonchi. No distress.  Abdomen:  +BS, soft, and non-distended. Mild tenderness to palpation in the epigastric area which is chronic. Moderate tenderness in the left mid abdomen and left flank. No significant LLQ pain. No rebound or guarding. No HSM or masses noted. Back: Left CVA tenderness present.  Msk:  Symmetrical without gross deformities. Normal posture. Extremities:  Without edema. Neurologic:  Alert and  oriented x4 Psych:  Normal mood and affect.

## 2019-12-09 ENCOUNTER — Encounter: Payer: Self-pay | Admitting: Gastroenterology

## 2019-12-09 ENCOUNTER — Ambulatory Visit (INDEPENDENT_AMBULATORY_CARE_PROVIDER_SITE_OTHER): Payer: Medicare Other | Admitting: Gastroenterology

## 2019-12-09 ENCOUNTER — Other Ambulatory Visit: Payer: Self-pay

## 2019-12-09 VITALS — BP 142/84 | HR 101 | Temp 97.5°F | Ht 61.5 in | Wt 185.2 lb

## 2019-12-09 DIAGNOSIS — R109 Unspecified abdominal pain: Secondary | ICD-10-CM | POA: Diagnosis not present

## 2019-12-09 DIAGNOSIS — R1010 Upper abdominal pain, unspecified: Secondary | ICD-10-CM | POA: Diagnosis not present

## 2019-12-09 DIAGNOSIS — K580 Irritable bowel syndrome with diarrhea: Secondary | ICD-10-CM

## 2019-12-09 MED ORDER — DEXILANT 60 MG PO CPDR: DELAYED_RELEASE_CAPSULE | ORAL | 11 refills | Status: DC

## 2019-12-09 NOTE — Patient Instructions (Addendum)
We will get you arranged to have a CT of your abdomen and pelvis.   Please complete labs to evaluate your pancreas.   Resume Dexilant 60 mg daily with breakfast.   Add benefiber or metamucil daily to help with stool consistency.   Increase your daily fiber intake with vegetables. Fruits are good options, but be mindful of your diabetes.   Avoid fried, fatty, greasy, and spicy foods. Only eat lean meats including poultry or fish.   Follow lactose free diet or take lactaid pills prior to consuming dairy products.   Try taking 1 tums with breakfast, lunch, and dinner to help with diarrhea.   We will call you with results and plan to see you back in the office in 4 months. Call if questions or concerns prior.   Aliene Altes, PA-C [facility]

## 2019-12-09 NOTE — Patient Instructions (Signed)
No PA needed for CT abd/pelvis w/contrast per [facility] website.

## 2019-12-10 ENCOUNTER — Other Ambulatory Visit: Payer: Self-pay

## 2019-12-10 ENCOUNTER — Encounter: Payer: Self-pay | Admitting: Gastroenterology

## 2019-12-10 ENCOUNTER — Telehealth: Payer: Self-pay | Admitting: Gastroenterology

## 2019-12-10 DIAGNOSIS — R109 Unspecified abdominal pain: Secondary | ICD-10-CM

## 2019-12-10 NOTE — Progress Notes (Signed)
Cc'ed to pcp °

## 2019-12-10 NOTE — Telephone Encounter (Addendum)
Pt will go to [employer] tomorrow after lunch. I am putting in the order. She will make sure they know she is to do 2 labs. She would like to post pone the CT for a little while.  She will be having some eye surgery next Thursday. Cyril Mourning, please advise!

## 2019-12-10 NOTE — Assessment & Plan Note (Addendum)
Chronic intermittent epigastric pain.  Previously thought this could be related to a hernia.  Patient saw Dr. Arnoldo Morale on 04/11/2019 and was diagnosed with diastases recti without recommendations for surgery.  However, fat containing ventral hernia noted on CT in October. Overall, patient feels symptoms are improved and has pain less than weekly.  Of note, EGD in 2019 with mild gastritis.  She has been off of Dexilant for some time.  Denies current GERD symptoms.  She was recently placed on naproxen 500 mg for left-sided pain.  Otherwise, no regular NSAID use.   Suspect patient likely has some mild chronic gastritis contributing to her intermittent epigastric pain. Hernia could still be playing a role.  She was advised to resume Dexilant 60 mg daily.  New prescription was sent to pharmacy.  Continue to monitor symptoms. Follow-up in 4 months for flank pain/left-sided abdominal pain.

## 2019-12-10 NOTE — Assessment & Plan Note (Addendum)
59 y.o. female reporting constant left flank pain, left mid abdominal pain, and left sided back pain x 4-5 months. 8/10 in severity. Nausea about 1 week ago with 2 episodes of vomiting without hematemesis that has now improved. Continues with occasional nausea. No association with eating or bowel habits. No urinary symptoms. Symptoms do worsen with turning. Also notes when walking, her left leg wants to give out at times. Prior CT A/P with and without contrast in October 2020 significant for bilateral adrenal adenomas mildly increased from 2018 and 2 mm nonobstructing interpolar left renal calculus. No hydronephrosis. Recently saw PCP and suspected possible kidney stone. Given shot of Toradol which helped for 15 minutes. Started on Naproxen 500 mg BID without any change in symptoms. Labs on 11/19/19. CBC remarkable for WBC 16.7. BMP with elevated glucose at 225, slightly low K at 3.4, and slightly low calcium at 8.8. Denies fever or chills. Abdominal exam with moderate tenderness in the left mid abdomen and left flank. No significant LLQ pain. Also with tenderness in the epigastric area which is chronic and unchanged. Positive left sided CVA tenderness.   As pain has been persistent over the last 4-5 months, I feel patient needs CT A/P repeated to rule out possible GI etiology including diverticulitis/pancreatitis. Nephrolithiasis also in the differential. Very possible there is also a MSK component. Would not expect pain to be coming from adrenal adenomas noted on CT.   Proceed with CT A/P with contrast ASAP Lipase. Decided to add on CBC after patient left. Have had difficulty reaching her by phone. Will have nurse try again and arrange for CBC to be completed with Lipase.  Further recommendations to follow.  Plan to follow-up in 4 months.

## 2019-12-10 NOTE — Telephone Encounter (Signed)
Doris, I have tried to call patient 3 times today. I would like to add on a CBC to the Lipase she is supposed to have drawn. She hasn't completed labs yet. I am not sure if she is going to [facility] or waiting to have labs completed at [facility] when she goes for her CT scheduled for 12/13/19. Could you try to reach out to her again later today and add on a CBC?

## 2019-12-10 NOTE — Assessment & Plan Note (Addendum)
History of IBS, typically diarrhea predominant. Overall, symptoms are improved compared to last visit in May 2020.  Currently having 1-2 BMs daily that are loose.  Occasional postprandial watery diarrhea 1-2 times a week with associated mild lower abdominal pain which patient is not bothered by.  Also reports BMs feel incomplete at times. No alarm symptoms. Colonoscopy up to date. Due for repeat in 2023.   Suspect bile salt diarrhea is also contributing to intermittent diarrhea.   Add benefiber or metamucil to help with intermittent incomplete BMs.  Increase daily fiber intake primarily through vegetables. May also eat fruits but be mindful of diabetes.  Increase water intake.  Avoid fried/fatty/greasy/spicy foods. All meats should be lean including fish or poultry.   Chew 1 tums prior to breakfast, lunch, and dinner to help with postprandial diarrhea.  Follow lactose free diet or take lactaid pills prior to consuming dairy.  Follow-up in 4 months for flank pain/left sided abdominal pain.

## 2019-12-11 ENCOUNTER — Encounter: Payer: Self-pay | Admitting: Psychiatry

## 2019-12-11 ENCOUNTER — Other Ambulatory Visit: Payer: Self-pay

## 2019-12-11 ENCOUNTER — Ambulatory Visit (INDEPENDENT_AMBULATORY_CARE_PROVIDER_SITE_OTHER): Payer: Medicare Other | Admitting: Psychiatry

## 2019-12-11 DIAGNOSIS — F4310 Post-traumatic stress disorder, unspecified: Secondary | ICD-10-CM

## 2019-12-11 DIAGNOSIS — R1010 Upper abdominal pain, unspecified: Secondary | ICD-10-CM | POA: Diagnosis not present

## 2019-12-11 DIAGNOSIS — F3341 Major depressive disorder, recurrent, in partial remission: Secondary | ICD-10-CM

## 2019-12-11 DIAGNOSIS — R109 Unspecified abdominal pain: Secondary | ICD-10-CM | POA: Diagnosis not present

## 2019-12-11 DIAGNOSIS — F410 Panic disorder [episodic paroxysmal anxiety] without agoraphobia: Secondary | ICD-10-CM

## 2019-12-11 LAB — CBC WITH DIFFERENTIAL/PLATELET
Absolute Monocytes: 636 cells/uL (ref 200–950)
Basophils Absolute: 84 cells/uL (ref 0–200)
Basophils Relative: 0.7 %
Eosinophils Absolute: 216 cells/uL (ref 15–500)
Eosinophils Relative: 1.8 %
HCT: 44.5 % (ref 35.0–45.0)
Hemoglobin: 15.2 g/dL (ref 11.7–15.5)
Lymphs Abs: 3288 cells/uL (ref 850–3900)
MCH: 31.7 pg (ref 27.0–33.0)
MCHC: 34.2 g/dL (ref 32.0–36.0)
MCV: 92.7 fL (ref 80.0–100.0)
MPV: 11.1 fL (ref 7.5–12.5)
Monocytes Relative: 5.3 %
Neutro Abs: 7776 cells/uL (ref 1500–7800)
Neutrophils Relative %: 64.8 %
Platelets: 246 10*3/uL (ref 140–400)
RBC: 4.8 10*6/uL (ref 3.80–5.10)
RDW: 12.8 % (ref 11.0–15.0)
Total Lymphocyte: 27.4 %
WBC: 12 10*3/uL — ABNORMAL HIGH (ref 3.8–10.8)

## 2019-12-11 LAB — LIPASE: Lipase: 97 U/L — ABNORMAL HIGH (ref 7–60)

## 2019-12-11 MED ORDER — DULOXETINE HCL 60 MG PO CPEP: 120.0000 mg | ORAL_CAPSULE | Freq: Every day | ORAL | 0 refills | Status: DC

## 2019-12-11 MED ORDER — ALPRAZOLAM 0.5 MG PO TABS: 0.5000 mg | ORAL_TABLET | Freq: Two times a day (BID) | ORAL | 1 refills | Status: DC | PRN

## 2019-12-11 NOTE — Telephone Encounter (Signed)
Noted  

## 2019-12-11 NOTE — Telephone Encounter (Signed)
PT was informed and said she is still in a lot of pain.  She has the number to reschedule if needed, but says she might proceed now.

## 2019-12-11 NOTE — Progress Notes (Signed)
[place] [facility] OP Progress Note  Virtual Visit via Telephone Note  I connected with YARI MOEDER on 12/11/19 at 10:30 AM EST by telephone and verified that I am speaking with the correct person using two identifiers.    I discussed the limitations, risks, security and privacy concerns of performing an evaluation and management service by telephone and the availability of in person appointments. I also discussed with the patient that there may be a patient responsible charge related to this service. The patient expressed understanding and agreed to proceed.   12/11/2019 10:48 AM VALA AVANCE  MRN:  [id number]  Chief Complaint: " I am still having a lot of panic attacks."  HPI:  Patient stated that her depression symptoms are controlled with the help of duloxetine however she continues to have anxiety and panic attacks.  She stated that lately she has been having couple of panic attacks every day.  She feels very anxious and during the panic episode her heart races fast, has increased sweating and a choking sensation.  She stated that after her panic episode she feels very sad and cries a lot.  She stated that she is scheduled to undergo an eye surgery very soon and she is very anxious about that as well. She stated that she has never misused any of her prescription medications and she is asked the other psychiatrist to increase her dose of Xanax for optimal effect.  She denied drinking alcohol excessively. I checked PDMP.   Visit Diagnosis:    ICD-10-CM   1. MDD (major depressive disorder), recurrent, in partial remission ([place])  F33.41   2. PTSD (post-traumatic stress disorder)  F43.10   3. Panic disorder  F41.0     Past Psychiatric History: MDD, PTSD, panic attacks  Past Medical History:  Past Medical History:  Diagnosis Date  . Anxiety and depression   . Bilateral carpal tunnel syndrome   . Chronic back pain   . Chronic neck pain   . Depression   . Diabetes mellitus   . GERD  (gastroesophageal reflux disease)   . H/O syncope   . Headache   . Hypertension   . IBS (irritable bowel syndrome)   . Manic depression ([place])   . Panic attack     Past Surgical History:  Procedure Laterality Date  . ABDOMINAL HYSTERECTOMY    . ABDOMINAL SURGERY    . APPENDECTOMY    . BIOPSY  09/05/2017   Procedure: BIOPSY;  Surgeon: [name], MD;  [facility]: [facility];  Service: Endoscopy;;  random colon  . BIOPSY  05/01/2018   Procedure: BIOPSY;  Surgeon: [name], MD;  Location: [facility];  Service: Endoscopy;;  gastric biopsy  . CARPAL TUNNEL RELEASE Bilateral   . CHOLECYSTECTOMY    . COLONOSCOPY WITH PROPOFOL N/A 09/05/2017   Procedure: COLONOSCOPY WITH PROPOFOL;  Surgeon: [name], MD; normal TI, 5 small polyps, diverticulosis in the cecum, internal and external hemorrhoids.  Random colon biopsies benign.  Colon polyps were hyperplastic.  Repeat in 2023.  Marland Kitchen DILATION AND CURETTAGE OF UTERUS    . ESOPHAGOGASTRODUODENOSCOPY (EGD) WITH PROPOFOL N/A 05/01/2018   Procedure: ESOPHAGOGASTRODUODENOSCOPY (EGD) WITH PROPOFOL;  Surgeon: [name], MD;  normal esophagus, small hiatal hernia, mild gastritis s/p biopsy, normal duodenum.  No H. pylori.   Marland Kitchen POLYPECTOMY  09/05/2017   Procedure: POLYPECTOMY;  Surgeon: [name], MD;  Location: [facility];  Service: Endoscopy;;  sigmoid and rectal  .  TUBAL LIGATION      Family Psychiatric History: see below  Family History:  Family History  Problem Relation Age of Onset  . Diabetes Mother   . Stroke Mother   . Depression Mother   . Colon cancer Father 25       Passed away from colon cancer  . Colon cancer Brother 31       Passed away from colon cancer  . Bipolar disorder Son     Social History:  Social History   Socioeconomic History  . Marital status: Legally Separated    Spouse name: Not on file  . Number of children: Not on file  . Years of education: Not on file  . Highest education level:  Not on file  Occupational History  . Not on file  Tobacco Use  . Smoking status: Current Every Day Smoker    Packs/day: 1.00    Years: 40.00    Pack years: 40.00    Types: Cigarettes  . Smokeless tobacco: Never Used  . Tobacco comment: one pack a day  Substance and Sexual Activity  . Alcohol use: No  . Drug use: No  . Sexual activity: Not Currently    Birth control/protection: Surgical  Other Topics Concern  . Not on file  Social History Narrative  . Not on file   Social Determinants of Health   Financial Resource Strain:   . Difficulty of Paying Living Expenses: Not on file  Food Insecurity:   . Worried About Charity fundraiser in the Last Year: Not on file  . Ran Out of Food in the Last Year: Not on file  Transportation Needs:   . Lack of Transportation (Medical): Not on file  . Lack of Transportation (Non-Medical): Not on file  Physical Activity:   . Days of Exercise per Week: Not on file  . Minutes of Exercise per Session: Not on file  Stress:   . Feeling of Stress : Not on file  Social Connections:   . Frequency of Communication with Friends and Family: Not on file  . Frequency of Social Gatherings with Friends and Family: Not on file  . Attends Religious Services: Not on file  . Active Member of Clubs or Organizations: Not on file  . Attends Archivist Meetings: Not on file  . Marital Status: Not on file    Allergies:  Allergies  Allergen Reactions  . Sinequan [Doxepin Hcl] Anaphylaxis  . Albuterol Other (See Comments)    Patient does not remember the reaction. This was a record provided via [facility]  . Haloperidol Other (See Comments)    Patient does not remember the reaction. This was a record provided via [facility]  . Methylphenidate Derivatives Hives  . Sertraline Other (See Comments)    Patient does not remember the reaction. This was a record provided via [facility]  . Tranxene [Clorazepate Dipotassium] Hives and  Swelling  . Vortioxetine Other (See Comments)    Patient does not remember the reaction. This was a record provided via [facility]    Metabolic Disorder Labs: Lab Results  Component Value Date   HGBA1C 6.7 (H) 08/29/2017   MPG 145.59 08/29/2017   No results found for: PROLACTIN No results found for: CHOL, TRIG, HDL, CHOLHDL, VLDL, LDLCALC No results found for: TSH  Therapeutic Level Labs: No results found for: LITHIUM No results found for: VALPROATE No components found for:  CBMZ  Current Medications: Current Outpatient Medications  Medication  Sig Dispense Refill  . acetaminophen (TYLENOL) 500 MG tablet Take 1,000 mg by mouth every 6 (six) hours as needed for mild pain or moderate pain.     Marland Kitchen ALPRAZolam (XANAX) 0.25 MG tablet Take 1 tablet (0.25 mg total) by mouth 2 (two) times daily as needed for anxiety. 60 tablet 3  . aspirin EC 81 MG tablet Take 81 mg by mouth every evening.     Marland Kitchen atorvastatin (LIPITOR) 10 MG tablet Take 10 mg by mouth every evening.     Marland Kitchen dexlansoprazole (DEXILANT) 60 MG capsule 1 PO EVERY MORNING WITH BREAKFAST. 30 capsule 11  . DULoxetine (CYMBALTA) 60 MG capsule Take 2 capsules (120 mg total) by mouth daily. (Patient taking differently: Take 60 mg by mouth daily. Takes one capsule at bedtime.) 180 capsule 1  . insulin lispro (HUMALOG) 100 UNIT/ML KwikPen 2 Units 3 (three) times daily.     Marland Kitchen lidocaine (LIDODERM) 5 % Place 1 patch onto the skin daily. Remove & Discard patch within 12 hours or as directed by MD (Patient not taking: Reported on 12/09/2019) 30 patch 0  . liraglutide (VICTOZA) 18 MG/3ML SOPN Inject into the skin. Takes 1.8 units in the morning    . naproxen (NAPROSYN) 500 MG tablet Take 500 mg by mouth 2 (two) times daily with a meal.    . ondansetron (ZOFRAN-ODT) 4 MG disintegrating tablet Take 4 mg by mouth as needed for nausea or vomiting.    Vladimir Faster Glycol-Propyl Glycol (SYSTANE) 0.4-0.3 % SOLN Apply to eye in the morning, at noon, in  the evening, and at bedtime.    . pramipexole (MIRAPEX) 0.125 MG tablet Take 0.125 mg by mouth at bedtime.     Marland Kitchen PROAIR HFA 108 (90 Base) MCG/ACT inhaler Inhale 1-2 puffs into the lungs every 6 (six) hours as needed for wheezing or shortness of breath.     . TRESIBA FLEXTOUCH 100 UNIT/ML SOPN FlexTouch Pen 13 Units. 13 units nightly     No current facility-administered medications for this visit.      Psychiatric Specialty Exam: Review of Systems  There were no vitals taken for this visit.There is no height or weight on file to calculate BMI.  General Appearance: unable to assess due to phone visit  Eye Contact:  unable to assess due to phone visit  Speech:  Clear and Coherent and Normal Rate  Volume:  Normal  Mood:  Anxious  Affect:  Congruent  Thought Process:  Goal Directed, Linear and Descriptions of Associations: Intact  Orientation:  Full (Time, Place, and Person)  Thought Content: Logical   Suicidal Thoughts:  No  Homicidal Thoughts:  No  Memory:  Recent;   Good Remote;   Good  Judgement:  Fair  Insight:  Fair  Psychomotor Activity:  Normal  Concentration:  Concentration: Good and Attention Span: Good  Recall:  Good  Fund of Knowledge: Good  Language: Good  Akathisia:  Negative  Handed:  Right  AIMS (if indicated): not done  Assets:  Communication Skills Desire for Improvement Financial Resources/Insurance Housing  ADL's:  Intact  Cognition: WNL  Sleep:  Fair     Assessment and Plan: 59 year old female with history of MDD, PTSD, panic disorder, type 2 diabetes mellitus, bilateral adrenal adenomas, acid reflux now contacted via phone for follow-up.  She reported overwhelming anxiety and panic attacks.  She requested for increasing her Xanax and it showed that she has never misused it in the past.  PDMP was checked. Xanax  dose was increased to 0.5 mg BID for optimal effect.  1. MDD (major depressive disorder), recurrent, in partial remission ([facility])  - DULoxetine  (CYMBALTA) 60 MG capsule; Take 2 capsules (120 mg total) by mouth daily.  Dispense: 180 capsule; Refill: 0  2. PTSD (post-traumatic stress disorder)  - DULoxetine (CYMBALTA) 60 MG capsule; Take 2 capsules (120 mg total) by mouth daily.  Dispense: 180 capsule; Refill: 0  3. Panic disorder - DULoxetine (CYMBALTA) 60 MG capsule; Take 2 capsules (120 mg total) by mouth daily.  Dispense: 180 capsule; Refill: 0 - Increase ALPRAZolam (XANAX) 0.5 MG tablet; Take 1 tablet (0.5 mg total) by mouth 2 (two) times daily as needed for anxiety.  Dispense: 60 tablet; Refill: 1  F/up with Dr. Modesta Messing in 2 months.   [place] Crane, MD 12/11/2019, 10:48 AM

## 2019-12-11 NOTE — Telephone Encounter (Signed)
Noted. Her CT is scheduled for 12/13/19. I would recommend she go ahead and have this completed considering the amount of pain she was endorsing at her office visit, but ultimately it is up to her when she has this completed. If she is wanting to reschedule the CT, she needs to call Central Scheduling at [phone].

## 2019-12-12 NOTE — Progress Notes (Signed)
Lipase mildly elevated. Nonspecific at this point. WBC slightly elevated at 12.0, also nonspecific .   Waiting on her CT which she definitely needs to complete tomorrow.

## 2019-12-13 ENCOUNTER — Other Ambulatory Visit: Payer: Self-pay

## 2019-12-13 ENCOUNTER — Ambulatory Visit (HOSPITAL_COMMUNITY)
Admission: RE | Admit: 2019-12-13 | Discharge: 2019-12-13 | Disposition: A | Discharge status: Home / Self Care | Payer: Medicare Other | Source: Ambulatory Visit | Attending: Gastroenterology | Admitting: Gastroenterology

## 2019-12-13 DIAGNOSIS — R109 Unspecified abdominal pain: Secondary | ICD-10-CM | POA: Insufficient documentation

## 2019-12-13 DIAGNOSIS — N200 Calculus of kidney: Secondary | ICD-10-CM | POA: Diagnosis not present

## 2019-12-13 DIAGNOSIS — R1010 Upper abdominal pain, unspecified: Secondary | ICD-10-CM | POA: Insufficient documentation

## 2019-12-13 LAB — POCT I-STAT CREATININE: Creatinine, Ser: 1.1 mg/dL — ABNORMAL HIGH (ref 0.44–1.00)

## 2019-12-13 MED ORDER — IOHEXOL 300 MG/ML  SOLN
100.0000 mL | Freq: Once | INTRAMUSCULAR | Status: AC | PRN
  Administered 2019-12-13: 100 mL via INTRAVENOUS

## 2019-12-18 NOTE — Progress Notes (Signed)
No acute abnormalities on CT scan to explain patient's left flank pain, back pain, and LUQ pain.  Pancreas, spleen, stomach, and bowel unremarkable.  No obvious left-sided kidney stone. I am more suspicious of a musculoskeletal cause as patient reported worsening of pain with twisting or turning as well as numbness and sensation of left leg wanting to give out when walking.   Discussed results with patient.  She continues with left-sided pain that is unchanged. She was advised to follow-up with her primary care for further evaluation of possible MSK or other etiology.  Manuela Schwartz, please fax CT result to PCP per patients request.

## 2019-12-23 DIAGNOSIS — M79672 Pain in left foot: Secondary | ICD-10-CM | POA: Diagnosis not present

## 2019-12-23 DIAGNOSIS — L110 Acquired keratosis follicularis: Secondary | ICD-10-CM | POA: Diagnosis not present

## 2019-12-23 DIAGNOSIS — I739 Peripheral vascular disease, unspecified: Secondary | ICD-10-CM | POA: Diagnosis not present

## 2019-12-23 DIAGNOSIS — E1140 Type 2 diabetes mellitus with diabetic neuropathy, unspecified: Secondary | ICD-10-CM | POA: Diagnosis not present

## 2019-12-23 DIAGNOSIS — M79671 Pain in right foot: Secondary | ICD-10-CM | POA: Diagnosis not present

## 2020-01-02 DIAGNOSIS — H2512 Age-related nuclear cataract, left eye: Secondary | ICD-10-CM | POA: Diagnosis not present

## 2020-01-02 DIAGNOSIS — H25812 Combined forms of age-related cataract, left eye: Secondary | ICD-10-CM | POA: Diagnosis not present

## 2020-01-23 DIAGNOSIS — H2511 Age-related nuclear cataract, right eye: Secondary | ICD-10-CM | POA: Diagnosis not present

## 2020-01-23 DIAGNOSIS — H25811 Combined forms of age-related cataract, right eye: Secondary | ICD-10-CM | POA: Diagnosis not present
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
## 2020-02-10 ENCOUNTER — Ambulatory Visit (INDEPENDENT_AMBULATORY_CARE_PROVIDER_SITE_OTHER): Payer: Medicare Other | Admitting: Psychiatry

## 2020-02-10 ENCOUNTER — Telehealth (HOSPITAL_COMMUNITY): Payer: Self-pay | Admitting: *Deleted

## 2020-02-10 ENCOUNTER — Encounter (HOSPITAL_COMMUNITY): Payer: Self-pay | Admitting: Psychiatry

## 2020-02-10 ENCOUNTER — Other Ambulatory Visit: Payer: Self-pay

## 2020-02-10 DIAGNOSIS — F410 Panic disorder [episodic paroxysmal anxiety] without agoraphobia: Secondary | ICD-10-CM | POA: Diagnosis not present

## 2020-02-10 DIAGNOSIS — F331 Major depressive disorder, recurrent, moderate: Secondary | ICD-10-CM

## 2020-02-10 DIAGNOSIS — F4310 Post-traumatic stress disorder, unspecified: Secondary | ICD-10-CM | POA: Diagnosis not present

## 2020-02-10 MED ORDER — DULOXETINE HCL 30 MG PO CPEP: ORAL_CAPSULE | ORAL | 0 refills | Status: DC

## 2020-02-10 MED ORDER — ALPRAZOLAM 0.5 MG PO TABS: 0.5000 mg | ORAL_TABLET | Freq: Two times a day (BID) | ORAL | 1 refills | Status: DC | PRN

## 2020-02-10 NOTE — Telephone Encounter (Signed)
THE Rx CALLED S[facility] HAD ??'S CONCERNING THE  DULoxetine (CYMBALTA). THE UP TITRATE WAS RECEIVED THE 60 MG + 30 MG = 90 MG.   BUT  IT WAS NOTED   THAT ON 12-11-2019 THAT Dr. Toy Care SENT IN Rx FOR  120 MG  60 MG + 60 MG = 120 MG DAILY. Rx ?? ARE WE DECREASING PREVIOUS DOSEAGE?

## 2020-02-10 NOTE — Telephone Encounter (Signed)
She states that she was taking duloxetine only one cap (60 mg) for the last few months on the visit today, although the original instruction was to take 120 mg. That is why the dose was increased to 90 mg daily.

## 2020-02-10 NOTE — Patient Instructions (Signed)
1.Increaseduloxetine 90 mg daily(60 mg + 30 mg) 2.Continue Xanax 0.5 mg twice a day as needed for anxiety 3.Next appointment:  5/24 at 8:50
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
## 2020-03-19 ENCOUNTER — Encounter: Payer: Self-pay | Admitting: Pharmacist

## 2020-03-23 ENCOUNTER — Telehealth (INDEPENDENT_AMBULATORY_CARE_PROVIDER_SITE_OTHER): Payer: Medicare Other | Admitting: Psychiatry

## 2020-03-23 ENCOUNTER — Encounter (HOSPITAL_COMMUNITY): Payer: Self-pay | Admitting: Psychiatry

## 2020-03-23 ENCOUNTER — Other Ambulatory Visit: Payer: Self-pay

## 2020-03-23 DIAGNOSIS — F330 Major depressive disorder, recurrent, mild: Secondary | ICD-10-CM

## 2020-03-23 DIAGNOSIS — F4310 Post-traumatic stress disorder, unspecified: Secondary | ICD-10-CM | POA: Diagnosis not present

## 2020-03-23 DIAGNOSIS — F410 Panic disorder [episodic paroxysmal anxiety] without agoraphobia: Secondary | ICD-10-CM

## 2020-03-23 DIAGNOSIS — F3341 Major depressive disorder, recurrent, in partial remission: Secondary | ICD-10-CM | POA: Diagnosis not present

## 2020-03-23 MED ORDER — DULOXETINE HCL 60 MG PO CPEP: 120.0000 mg | ORAL_CAPSULE | Freq: Every day | ORAL | 0 refills | Status: DC

## 2020-03-23 MED ORDER — ALPRAZOLAM 0.5 MG PO TABS: 0.5000 mg | ORAL_TABLET | Freq: Two times a day (BID) | ORAL | 1 refills | Status: DC | PRN

## 2020-03-23 NOTE — Patient Instructions (Signed)
1.Increaseduloxetine120 mg 2.Continue Xanax 0.5 mg twice a dayas neededfor anxiety  3.Next appointment:7/30 at 11 AM

## 2020-04-10 ENCOUNTER — Ambulatory Visit: Payer: Medicare Other | Admitting: Gastroenterology

## 2020-05-17 ENCOUNTER — Other Ambulatory Visit: Payer: Self-pay

## 2020-05-17 ENCOUNTER — Emergency Department (HOSPITAL_COMMUNITY)
Admission: EM | Admit: 2020-05-17 | Discharge: 2020-05-17 | Disposition: A | Discharge status: Home / Self Care | Payer: Medicare Other | Attending: Emergency Medicine | Admitting: Emergency Medicine

## 2020-05-17 ENCOUNTER — Encounter (HOSPITAL_COMMUNITY): Payer: Self-pay | Admitting: *Deleted

## 2020-05-17 DIAGNOSIS — F17210 Nicotine dependence, cigarettes, uncomplicated: Secondary | ICD-10-CM | POA: Diagnosis not present

## 2020-05-17 DIAGNOSIS — Z7984 Long term (current) use of oral hypoglycemic drugs: Secondary | ICD-10-CM | POA: Insufficient documentation

## 2020-05-17 DIAGNOSIS — E119 Type 2 diabetes mellitus without complications: Secondary | ICD-10-CM | POA: Insufficient documentation

## 2020-05-17 DIAGNOSIS — R519 Headache, unspecified: Secondary | ICD-10-CM | POA: Diagnosis not present

## 2020-05-17 DIAGNOSIS — Z7982 Long term (current) use of aspirin: Secondary | ICD-10-CM | POA: Insufficient documentation

## 2020-05-17 DIAGNOSIS — I10 Essential (primary) hypertension: Secondary | ICD-10-CM | POA: Insufficient documentation

## 2020-05-17 DIAGNOSIS — Z79899 Other long term (current) drug therapy: Secondary | ICD-10-CM | POA: Diagnosis not present

## 2020-05-17 HISTORY — DX: Migraine, unspecified, not intractable, without status migrainosus: G43.909

## 2020-05-17 MED ORDER — NAPROXEN 500 MG PO TABS
500.0000 mg | ORAL_TABLET | Freq: Two times a day (BID) | ORAL | 0 refills | Status: DC | PRN
Start: 2020-05-17 — End: 2020-05-28

## 2020-05-17 MED ORDER — KETOROLAC TROMETHAMINE 30 MG/ML IJ SOLN
15.0000 mg | Freq: Once | INTRAMUSCULAR | Status: AC
  Administered 2020-05-17: 15 mg via INTRAVENOUS
  Filled 2020-05-17: qty 1

## 2020-05-17 MED ORDER — LIDOCAINE 5 % EX PTCH
1.0000 | MEDICATED_PATCH | CUTANEOUS | Status: DC
  Administered 2020-05-17: 1 via TRANSDERMAL
  Filled 2020-05-17: qty 1

## 2020-05-17 MED ORDER — PROCHLORPERAZINE EDISYLATE 10 MG/2ML IJ SOLN
10.0000 mg | Freq: Once | INTRAMUSCULAR | Status: AC
  Administered 2020-05-17: 10 mg via INTRAVENOUS
  Filled 2020-05-17: qty 2

## 2020-05-17 MED ORDER — SODIUM CHLORIDE 0.9 % IV BOLUS
1000.0000 mL | Freq: Once | INTRAVENOUS | Status: AC
  Administered 2020-05-17: 1000 mL via INTRAVENOUS

## 2020-05-17 MED ORDER — DIPHENHYDRAMINE HCL 50 MG/ML IJ SOLN
12.5000 mg | Freq: Once | INTRAMUSCULAR | Status: AC
  Administered 2020-05-17: 12.5 mg via INTRAVENOUS
  Filled 2020-05-17: qty 1

## 2020-05-17 MED ORDER — LIDOCAINE 5 % EX PTCH
1.0000 | MEDICATED_PATCH | Freq: Every day | CUTANEOUS | 0 refills | Status: DC | PRN
Start: 2020-05-17 — End: 2020-10-28

## 2020-05-17 NOTE — ED Provider Notes (Signed)
[facility] EMERGENCY DEPARTMENT Provider Note   CSN: [id number] Arrival date & time: 05/17/20  1642     History Chief Complaint  Patient presents with  . Migraine    Katelyn Kelly is a 59 y.o. female with a history of anxiety, depression, migraines, IBS, diabetes mellitus, & chronic neck & back pain who presents to the ED with complaints of headache since this AM. Patient states pain is located to the R side of her head & neck, it is constant, 10/10 in severity, worse with bright lights, no significant alleviating factors. Tried topamax & advil this AM without relief. Having associated nausea w/ 1 episode of emesis. Pain is gradually worsening. Has history of similar headaches, this feels like her migraines. Denies fever, chills, visual disturbance, numbness, weakness, syncope, or abdominal pain.   HPI     Past Medical History:  Diagnosis Date  . Anxiety and depression   . Bilateral carpal tunnel syndrome   . Chronic back pain   . Chronic neck pain   . Depression   . Diabetes mellitus   . GERD (gastroesophageal reflux disease)   . H/O syncope   . Headache   . Hypertension   . IBS (irritable bowel syndrome)   . Manic depression ([place])   . Migraine   . Panic attack     Patient Active Problem List   Diagnosis Date Noted  . Panic disorder 12/11/2019  . Flank pain 12/09/2019  . Diastasis recti 04/11/2019  . Intermittent upper abdominal pain 03/28/2019  . MDD (major depressive disorder), recurrent episode, moderate ([place]) 11/26/2018  . PTSD (post-traumatic stress disorder) 11/26/2018  . Nausea with vomiting 02/14/2018  . GERD (gastroesophageal reflux disease) 11/08/2017  . Dysphagia 11/08/2017  . Taking multiple medications for chronic disease 08/02/2017  . Family history of colon cancer 08/02/2017  . IBS (irritable bowel syndrome) 08/02/2017  . LATERAL EPICONDYLITIS 08/27/2009  . JOINT PAIN, HAND 07/30/2009  . TRIGGER FINGER 07/30/2009  . CARPAL TUNNEL SYNDROME  03/04/2009  . CERVICAL RADICULITIS 03/04/2009  . NUMBNESS, HAND 02/02/2009    Past Surgical History:  Procedure Laterality Date  . ABDOMINAL HYSTERECTOMY    . ABDOMINAL SURGERY    . APPENDECTOMY    . BIOPSY  09/05/2017   Procedure: BIOPSY;  Surgeon: [name], MD;  [facility];  Service: Endoscopy;;  random colon  . BIOPSY  05/01/2018   Procedure: BIOPSY;  Surgeon: [name], MD;  [facility];  Service: Endoscopy;;  gastric biopsy  . CARPAL TUNNEL RELEASE Bilateral   . CHOLECYSTECTOMY    . COLONOSCOPY WITH PROPOFOL N/A 09/05/2017   Procedure: COLONOSCOPY WITH PROPOFOL;  Surgeon: [name], MD; normal TI, 5 small polyps, diverticulosis in the cecum, internal and external hemorrhoids.  Random colon biopsies benign.  Colon polyps were hyperplastic.  Repeat in 2023.  Marland Kitchen DILATION AND CURETTAGE OF UTERUS    . ESOPHAGOGASTRODUODENOSCOPY (EGD) WITH PROPOFOL N/A 05/01/2018   Procedure: ESOPHAGOGASTRODUODENOSCOPY (EGD) WITH PROPOFOL;  Surgeon: [name], MD;  normal esophagus, small hiatal hernia, mild gastritis s/p biopsy, normal duodenum.  No H. pylori.   Marland Kitchen POLYPECTOMY  09/05/2017   Procedure: POLYPECTOMY;  Surgeon: [name], MD;  [facility];  Service: Endoscopy;;  sigmoid and rectal  . TUBAL LIGATION       OB History    Gravida  4   Para  2   Term  2   Preterm  AB  2   Living  2     SAB  2   TAB      Ectopic      Multiple      Live Births              Family History  Problem Relation Age of Onset  . Diabetes Mother   . Stroke Mother   . Depression Mother   . Colon cancer Father 12       Passed away from colon cancer  . Colon cancer Brother 76       Passed away from colon cancer  . Bipolar disorder Son     Social History   Tobacco Use  . Smoking status: Current Every Day Smoker    Packs/day: 1.00    Years: 40.00    Pack years: 40.00    Types: Cigarettes  . Smokeless tobacco: Never Used    . Tobacco comment: one pack a day  Vaping Use  . Vaping Use: Never used  Substance Use Topics  . Alcohol use: No  . Drug use: No    Home Medications Prior to Admission medications   Medication Sig Start Date End Date Taking? Authorizing Provider  acetaminophen (TYLENOL) 500 MG tablet Take 1,000 mg by mouth every 6 (six) hours as needed for mild pain or moderate pain.     [provider]  ALPRAZolam Duanne Moron) 0.5 MG tablet Take 1 tablet (0.5 mg total) by mouth 2 (two) times daily as needed for anxiety. 04/10/20   [name], MD  aspirin EC 81 MG tablet Take 81 mg by mouth every evening.     [provider]  atorvastatin (LIPITOR) 10 MG tablet Take 10 mg by mouth every evening.     [provider]  dexlansoprazole (DEXILANT) 60 MG capsule 1 PO EVERY MORNING WITH BREAKFAST. 12/09/19   Erenest Rasher, PA-C  DULoxetine (CYMBALTA) 60 MG capsule Take 2 capsules (120 mg total) by mouth daily. 03/23/20   [name], MD  insulin lispro (HUMALOG) 100 UNIT/ML KwikPen 2 Units 3 (three) times daily.  03/09/19   [provider]  lidocaine (LIDODERM) 5 % Place 1 patch onto the skin daily. Remove & Discard patch within 12 hours or as directed by MD Patient not taking: Reported on 12/09/2019 07/14/19   Nuala Alpha A, PA-C  liraglutide (VICTOZA) 18 MG/3ML SOPN Inject into the skin. Takes 1.8 units in the morning    [provider]  naproxen (NAPROSYN) 500 MG tablet Take 500 mg by mouth 2 (two) times daily with a meal.    [provider]  ondansetron (ZOFRAN-ODT) 4 MG disintegrating tablet Take 4 mg by mouth as needed for nausea or vomiting.    [provider]  Polyethyl Glycol-Propyl Glycol (SYSTANE) 0.4-0.3 % SOLN Apply to eye in the morning, at noon, in the evening, and at bedtime.    [provider]  pramipexole (MIRAPEX) 0.125 MG tablet Take 0.125 mg by mouth at bedtime.  05/28/18   [provider]  PROAIR HFA 108 (90  Base) MCG/ACT inhaler Inhale 1-2 puffs into the lungs every 6 (six) hours as needed for wheezing or shortness of breath.  11/07/18   [provider]  TRESIBA FLEXTOUCH 100 UNIT/ML SOPN FlexTouch Pen 13 Units. 13 units nightly 03/20/19   [provider]    Allergies    Sinequan [doxepin hcl], Albuterol, Haloperidol, Methylphenidate derivatives, Sertraline, Tranxene [clorazepate dipotassium], and Vortioxetine  Review of Systems  Review of Systems  Constitutional: Negative for chills and fever.  Eyes: Negative for visual disturbance.  Respiratory: Negative for shortness of breath.   Cardiovascular: Negative for chest pain.  Gastrointestinal: Positive for nausea and vomiting. Negative for abdominal pain.  Musculoskeletal: Positive for neck pain.  Neurological: Positive for headaches. Negative for seizures, syncope, speech difficulty, weakness and numbness.  All other systems reviewed and are negative.   Physical Exam Updated Vital Signs BP (!) 153/93 (BP Location: Right Arm)   Pulse 99   Temp 97.9 F (36.6 C) (Oral)   Resp 16   Ht 5\' 1"  (1.549 m)   Wt 95.3 kg   SpO2 99%   BMI 39.68 kg/m   Physical Exam Vitals and nursing note reviewed.  Constitutional:      General: She is not in acute distress.    Appearance: She is well-developed. She is not toxic-appearing.  HENT:     Head: Normocephalic and atraumatic.  Eyes:     General: Vision grossly intact. Gaze aligned appropriately.        Right eye: No discharge.        Left eye: No discharge.     Extraocular Movements: Extraocular movements intact.     Conjunctiva/sclera: Conjunctivae normal.     Comments: PERRL. No proptosis.   Cardiovascular:     Rate and Rhythm: Normal rate and regular rhythm.  Pulmonary:     Effort: Pulmonary effort is normal. No respiratory distress.     Breath sounds: Normal breath sounds. No wheezing, rhonchi or rales.  Abdominal:     General: There is no distension.     Palpations:  Abdomen is soft.     Tenderness: There is no abdominal tenderness.  Musculoskeletal:     Cervical back: Normal range of motion and neck supple. Tenderness (R trapezius/paraspinal muscles) present. No rigidity.  Skin:    General: Skin is warm and dry.     Findings: No rash.  Neurological:     Comments: Alert. Clear speech. No facial droop. CNIII-XII grossly intact. Bilateral upper and lower extremities' sensation grossly intact. 5/5 symmetric strength with grip strength and with plantar and dorsi flexion bilaterally . Normal finger to nose bilaterally. Negative pronator drift. Negative Romberg sign. Gait is steady and intact.   Psychiatric:        Behavior: Behavior normal.    ED Results / Procedures / Treatments   Labs (all labs ordered are listed, but only abnormal results are displayed) Labs Reviewed - No data to display  EKG None  Radiology No results found.  Procedures Procedures (including critical care time)  Medications Ordered in ED Medications  lidocaine (LIDODERM) 5 % 1 patch (1 patch Transdermal Patch Applied 05/17/20 2216)  prochlorperazine (COMPAZINE) injection 10 mg (10 mg Intravenous Given 05/17/20 2103)  sodium chloride 0.9 % bolus 1,000 mL (0 mLs Intravenous Stopped 05/17/20 2215)  diphenhydrAMINE (BENADRYL) injection 12.5 mg (12.5 mg Intravenous Given 05/17/20 2103)  ketorolac (TORADOL) 30 MG/ML injection 15 mg (15 mg Intravenous Given 05/17/20 2215)    ED Course  I have reviewed the triage vital signs and the nursing notes.  Pertinent labs & imaging results that were available during my care of the patient were reviewed by me and considered in my medical decision making (see chart for details).    MDM Rules/Calculators/A&P                         Patient presents to the  ED with complaints of headache that began this AM.   Additional history obtained:  Additional history obtained from chart review & nursing note review.   ED Course:  Following initial  assessment I have ordered Compazine, Benadryl, and fluids for symptomatic relief.  21:45: RE-EVAL: Patient's pain is more bearable now, currently 7 out of 10 in severity.  She is tolerating p.o.  Will give Toradol and apply Lidoderm patch to her right trapezius area with plan for reassessment.  22:40: RE-EVAL: Patient states she is feeling much better, she is ready to go home.  Patient has hx of similar headaches, gradual onset with steady progression in severity, she has no focal neurologic deficits, dizziness, change in vision, proptosis, fever, or nuchal rigidity- overall doubt SAH, ICH, ischemic CVA, dural venous sinus thrombosis, acute glaucoma, giant cell arteritis, mass, or meningitis. On re-assessment she is feeling much better, overall appears appropriate for discharge home.  I discussed treatment plan, need for PCP follow-up, and return precautions with the patient. Provided opportunity for questions, patient confirmed understanding and is in agreement with plan.   Portions of this note were generated with Lobbyist. Dictation errors may occur despite best attempts at proofreading.  Final Clinical Impression(s) / ED Diagnoses Final diagnoses:  Acute nonintractable headache, unspecified headache type    Rx / DC Orders ED Discharge Orders         Ordered    naproxen (NAPROSYN) 500 MG tablet  2 times daily PRN     Discontinue  Reprint     05/17/20 2249    lidocaine (LIDODERM) 5 %  Daily PRN     Discontinue  Reprint     05/17/20 2249           Gyselle Matthew, Glynda Jaeger, PA-C 05/17/20 2311    Daleen Bo, MD 05/18/20 1136

## 2020-05-17 NOTE — ED Triage Notes (Signed)
Patient comes to the ED with a migraine since this morning.  Patient reports taking her migraine medication of unknown name and advil with no relief.  Patient reports nausea with no vomiting.  Patient reports intermittent pain in left side of her neck when she turns her head for 2 weeks.

## 2020-05-17 NOTE — Discharge Instructions (Signed)
You were seen in the ER today for headache/neck pain. This improved with medications given in the ER.  We are sending home with a Lidoderm patch to apply to your neck as needed for pain as well as naproxen to take as needed for head/neck pain.  - Naproxen is a nonsteroidal anti-inflammatory medication that will help with pain and swelling. Be sure to take this medication as prescribed with food, 1 pill every 12 hours,  It should be taken with food, as it can cause stomach upset, and more seriously, stomach bleeding. Do not take other nonsteroidal anti-inflammatory medications with this such as Advil, Motrin, Aleve, Mobic, Goodie Powder, or Motrin.    You make take Tylenol per over the counter dosing with these medications.   We have prescribed you new medication(s) today. Discuss the medications prescribed today with your pharmacist as they can have adverse effects and interactions with your other medicines including over the counter and prescribed medications. Seek medical evaluation if you start to experience new or abnormal symptoms after taking one of these medicines, seek care immediately if you start to experience difficulty breathing, feeling of your throat closing, facial swelling, or rash as these could be indications of a more serious allergic reaction    Please follow-up with your primary care provider within 3 days for reevaluation.  Return to the ER for new or worsening symptoms including but not limited to increased pain, fever, neck stiffness, numbness, weakness, change in vision, seizure activity, loss of consciousness, inability to keep fluids down, or any other concerns.

## 2020-05-19 ENCOUNTER — Other Ambulatory Visit: Payer: Self-pay | Admitting: Family Medicine

## 2020-05-19 DIAGNOSIS — G4489 Other headache syndrome: Secondary | ICD-10-CM

## 2020-05-21 ENCOUNTER — Ambulatory Visit
Admission: RE | Admit: 2020-05-21 | Discharge: 2020-05-21 | Disposition: A | Discharge status: Home / Self Care | Payer: Medicare Other | Source: Ambulatory Visit | Attending: Family Medicine | Admitting: Family Medicine

## 2020-05-21 DIAGNOSIS — G4489 Other headache syndrome: Secondary | ICD-10-CM
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
## 2020-05-27 NOTE — Progress Notes (Signed)
Referring Provider: Pieter Partridge, PA Primary Care Physician:  Nickola Major, MD Primary GI Physician: Dr. Oneida Alar (pending Dr. Abbey Chatters); Dr. Gala Romney following for now  Chief Complaint  Patient presents with  . Abdominal Pain  . Nausea    with vomiting    HPI:   Katelyn Kelly is a 59 y.o. female with a history of IBS, GERD, cholecystectomy.  EGD in July 2019 with normal esophagus, small hiatal hernia, mild gastritis s/p biopsy, normal duodenum.  No H. pylori.  Colonoscopy up-to-date in November 2018 with normal TI, 5 small polyps, diverticulosis in the cecum, internal and external hemorrhoids.  Random colon biopsies benign.  Colon polyps were hyperplastic.  Recommended repeat colonoscopy in 5 years.    She presents today for follow-up of GERD, IBS, intermittent upper abdominal pain, and left flank pain.  She was last seen in our office on 12/09/2019.  She reported 4 to 71-month history of pain in her left flank, back, and left side of her abdomen.  Some days worse than others.  Had a shot of Toradol which helped for a short period.  Was currently on naproxen 500 mg daily without improvement.  Worsened with twisting.  When walking a long time, would feel her left leg wanted to give out.  No association with food or bowel movements.  No urinary symptoms.  Rare epigastric pain.  Overall, that had improved.  She was not taking Dexilant and continued without GERD symptoms. Was having significant nausea around 11/30/19 and vomited twice. No hematemesis. Nausea had improved. Typically loose/mushy BMs daily to twice daily.  Occasional watery stool.  Rare lower abdominal pain prior to BMs that patient reported was not bothersome. Abdominal exam with moderate tenderness in the left mid abdomen and left flank. No significant LLQ pain. Also with tenderness in the epigastric area which is chronic and unchanged. Positive left sided CVA tenderness.  Plan included CT A/P with contrast, CBC, lipase, add daily  fiber supplement, increase water intake, follow low-fat diet, lactose-free diet, resume 1 Tums with meals and resume Dexilant, follow-up in 4 months.   Labs completed 12/11/2019: Lipase 97, CBC within normal limits except for WBC 12.0. CT A/P with contrast 12/13/2019: No acute abnormalities to explain patient's left flank pain, back pain, and LUQ pain.  Pancreas, spleen, stomach, and bowel unremarkable.  No obvious left-sided kidney stone.  Redemonstration of right renal calculus, unchanged bilateral adrenal adenoma, and small upper abdominal midline ventral hernia containing fat.  More suspicious of MSK etiology.  Patient was advised to follow-up with her PCP.  Today:, Continues with left lower back pain. Using a lidocaine patch.   Continues to have mild tenderness in the epigastric area. States she knows it is there. When she eats, she feels the food just sits in her upper abdomen. This is chronic. Feels like she has a little muscle spasm in the upper abdomen at times. She will rub it to make it stop.    Feels she gets full quickly and has bloating after eating. States she had a migraine and stopped eating and drinking much for 24 hours. States her abdomen was flat. After she drank the first bottle of water, her abdomen was distended again.    Nausea on a daily bases. Zofran once a day. Can wake up with nausea or nausea can come and go throughout the day; it can be before meals or after meals. No fried/fatty foods. No fast foods. Salads once a week. No  beef. Eats a lot of vegetables. Occasional vomiting. Maybe twice a week. No hematemesis.   States she is very stressed. Her son is on drugs and is very short tempered. Feels this is contributing. When she is stressed, she gets constipated, diarrhea otherwise. Mild lower abdominal pain at times prior to BMs but not all the time. Only having about 1-2 BMs a week. Not taking anything to help with this.  No bright red blood per rectum or melena.  She has  lost some weight. About 6 lbs since February.   GERD is well controlled. No dyspahgia.  No NSAIDs.  Past Medical History:  Diagnosis Date  . Anxiety and depression   . Bilateral carpal tunnel syndrome   . Chronic back pain   . Chronic neck pain   . Depression   . Diabetes mellitus   . GERD (gastroesophageal reflux disease)   . H/O syncope   . Headache   . Hypertension   . IBS (irritable bowel syndrome)   . Manic depression ([place])   . Migraine   . Panic attack     Past Surgical History:  Procedure Laterality Date  . ABDOMINAL HYSTERECTOMY    . ABDOMINAL SURGERY    . APPENDECTOMY    . BIOPSY  09/05/2017   Procedure: BIOPSY;  Surgeon: [name], MD;  [facility];  Service: Endoscopy;;  random colon  . BIOPSY  05/01/2018   Procedure: BIOPSY;  Surgeon: [name], MD;  [facility];  Service: Endoscopy;;  gastric biopsy  . CARPAL TUNNEL RELEASE Bilateral   . CHOLECYSTECTOMY    . COLONOSCOPY WITH PROPOFOL N/A 09/05/2017   Procedure: COLONOSCOPY WITH PROPOFOL;  Surgeon: [name], MD; normal TI, 5 small polyps, diverticulosis in the cecum, internal and external hemorrhoids.  Random colon biopsies benign.  Colon polyps were hyperplastic.  Repeat in 2023.  Marland Kitchen DILATION AND CURETTAGE OF UTERUS    . ESOPHAGOGASTRODUODENOSCOPY (EGD) WITH PROPOFOL N/A 05/01/2018   Procedure: ESOPHAGOGASTRODUODENOSCOPY (EGD) WITH PROPOFOL;  Surgeon: [name], MD;  normal esophagus, small hiatal hernia, mild gastritis s/p biopsy, normal duodenum.  No H. pylori.   Marland Kitchen POLYPECTOMY  09/05/2017   Procedure: POLYPECTOMY;  Surgeon: [name], MD;  [facility];  Service: Endoscopy;;  sigmoid and rectal  . TUBAL LIGATION      Current Outpatient Medications  Medication Sig Dispense Refill  . ALPRAZolam (XANAX) 0.5 MG tablet Take 1 tablet (0.5 mg total) by mouth 2 (two) times daily as needed for anxiety. 60 tablet 1  . Black Cohosh 40 MG CAPS Take by mouth  daily.    Marland Kitchen dexlansoprazole (DEXILANT) 60 MG capsule 1 PO EVERY MORNING WITH BREAKFAST. 30 capsule 11  . DULoxetine (CYMBALTA) 60 MG capsule Take 2 capsules (120 mg total) by mouth daily. (Patient taking differently: Take 60 mg by mouth daily. Takes 180 mg at night.) 180 capsule 0  . insulin lispro (HUMALOG) 100 UNIT/ML KwikPen Inject 2 Units into the skin 3 (three) times daily.     Marland Kitchen lidocaine (LIDODERM) 5 % Place 1 patch onto the skin daily as needed. Apply patch to area most significant pain once per day.  Remove and discard patch within 12 hours of application. 30 patch 0  . liraglutide (VICTOZA) 18 MG/3ML SOPN Inject 1.8 mg into the skin in the morning. Takes 1.8 units in the morning     . ondansetron (ZOFRAN-ODT) 4 MG disintegrating tablet Take 1 tablet (4  mg total) by mouth every 8 (eight) hours as needed for nausea or vomiting. 30 tablet 2  . pramipexole (MIRAPEX) 0.125 MG tablet Take 0.125 mg by mouth at bedtime.     . SUMAtriptan (IMITREX) 50 MG tablet Take 1 tablet by mouth as directed.    . topiramate (TOPAMAX) 25 MG tablet Take 50 mg by mouth 2 (two) times daily.    . TRESIBA FLEXTOUCH 100 UNIT/ML SOPN FlexTouch Pen Inject 16 Units into the skin at bedtime.     Marland Kitchen PROAIR HFA 108 (90 Base) MCG/ACT inhaler Inhale 1-2 puffs into the lungs every 6 (six) hours as needed for wheezing or shortness of breath.  (Patient not taking: Reported on 05/28/2020)     No current facility-administered medications for this visit.    Allergies as of 05/28/2020 - Review Complete 05/28/2020  Allergen Reaction Noted  . Sinequan [doxepin hcl] Anaphylaxis 01/10/2012  . Albuterol Other (See Comments) 11/08/2017  . Haloperidol Other (See Comments) 11/08/2017  . Methylphenidate derivatives Hives 07/28/2013  . Sertraline Other (See Comments) 11/08/2017  . Tranxene [clorazepate dipotassium] Hives and Swelling 01/10/2012  . Vortioxetine Other (See Comments) 11/08/2017    Family History  Problem Relation Age  of Onset  . Diabetes Mother   . Stroke Mother   . Depression Mother   . Colon cancer Father 53       Passed away from colon cancer  . Colon cancer Brother 30       Passed away from colon cancer  . Bipolar disorder Son     Social History   Socioeconomic History  . Marital status: Legally Separated    Spouse name: Not on file  . Number of children: Not on file  . Years of education: Not on file  . Highest education level: Not on file  Occupational History  . Not on file  Tobacco Use  . Smoking status: Current Every Day Smoker    Packs/day: 1.00    Years: 40.00    Pack years: 40.00    Types: Cigarettes  . Smokeless tobacco: Never Used  . Tobacco comment: one pack a day  Vaping Use  . Vaping Use: Never used  Substance and Sexual Activity  . Alcohol use: No  . Drug use: No  . Sexual activity: Not Currently    Birth control/protection: Surgical  Other Topics Concern  . Not on file  Social History Narrative  . Not on file   Social Determinants of Health   Financial Resource Strain:   . Difficulty of Paying Living Expenses:   Food Insecurity:   . Worried About Charity fundraiser in the Last Year:   . Arboriculturist in the Last Year:   Transportation Needs:   . Film/video editor (Medical):   Marland Kitchen Lack of Transportation (Non-Medical):   Physical Activity:   . Days of Exercise per Week:   . Minutes of Exercise per Session:   Stress:   . Feeling of Stress :   Social Connections:   . Frequency of Communication with Friends and Family:   . Frequency of Social Gatherings with Friends and Family:   . Attends Religious Services:   . Active Member of Clubs or Organizations:   . Attends Archivist Meetings:   Marland Kitchen Marital Status:     Review of Systems: Gen: Denies fever, chills cold or flulike symptoms, lightheadedness, dizziness, presyncope, syncope CV: Denies chest pain or palpitations Resp: Denies dyspnea or cough GI: See  HPI Heme: See HPI  Physical  Exam: BP 120/79   Pulse 104   Temp (!) 96.8 F (36 C) (Temporal)   Ht 5\' 2"  (1.575 m)   Wt 179 lb 6.4 oz (81.4 kg)   BMI 32.81 kg/m  General:   Alert and oriented. No distress noted. Pleasant and cooperative.  Head:  Normocephalic and atraumatic. Eyes:  Conjuctiva clear without scleral icterus. Heart:  S1, S2 present without murmurs appreciated. Lungs:  Clear to auscultation bilaterally. No wheezes, rales, or rhonchi. No distress.  Abdomen:  +BS, soft and non-distended. Moderate tenderness to palpation in the epigastric area. No rebound or guarding. No HSM or masses noted. Msk:  Symmetrical without gross deformities. Normal posture. Extremities:  Without edema. Neurologic:  Alert and  oriented x4 Psych: Normal mood and affect.

## 2020-05-28 ENCOUNTER — Encounter: Payer: Self-pay | Admitting: Gastroenterology

## 2020-05-28 ENCOUNTER — Other Ambulatory Visit: Payer: Self-pay

## 2020-05-28 ENCOUNTER — Ambulatory Visit (INDEPENDENT_AMBULATORY_CARE_PROVIDER_SITE_OTHER): Payer: Medicare Other | Admitting: Gastroenterology

## 2020-05-28 VITALS — BP 120/79 | HR 104 | Temp 96.8°F | Ht 62.0 in | Wt 179.4 lb

## 2020-05-28 DIAGNOSIS — R6881 Early satiety: Secondary | ICD-10-CM

## 2020-05-28 DIAGNOSIS — K219 Gastro-esophageal reflux disease without esophagitis: Secondary | ICD-10-CM

## 2020-05-28 DIAGNOSIS — K580 Irritable bowel syndrome with diarrhea: Secondary | ICD-10-CM | POA: Diagnosis not present

## 2020-05-28 DIAGNOSIS — R112 Nausea with vomiting, unspecified: Secondary | ICD-10-CM | POA: Diagnosis not present

## 2020-05-28 DIAGNOSIS — R1010 Upper abdominal pain, unspecified: Secondary | ICD-10-CM

## 2020-05-28 MED ORDER — DEXILANT 60 MG PO CPDR: DELAYED_RELEASE_CAPSULE | ORAL | 11 refills | Status: DC

## 2020-05-28 MED ORDER — ONDANSETRON 4 MG PO TBDP: 4.0000 mg | ORAL_TABLET | Freq: Three times a day (TID) | ORAL | 2 refills | Status: AC | PRN

## 2020-05-28 NOTE — Assessment & Plan Note (Signed)
Addressed under nausea vomiting

## 2020-05-28 NOTE — Assessment & Plan Note (Addendum)
History of IBS, typically diarrhea predominant.  However, patient reports being under significant amount of stress and is now struggling with constipation with 1-2 BMs a week.  Mid to lower abdominal pain at times prior to BMs that resolves thereafter.  No bright red blood per rectum or melena.  Colonoscopy up-to-date in November 2018 with 5 hyperplastic polyps, diverticulosis in the cecum, internal and external hemorrhoids, due for repeat in 2023.  Plan: Start MiraLAX 1 capful (17 g) daily in 8 ounces of water. Follow-up in 3 months.

## 2020-05-28 NOTE — Assessment & Plan Note (Addendum)
Chronic history of nausea with intermittent vomiting.  Also with chronic early satiety, bloating, and intermittent upper abdominal discomfort that is more of a tenderness to palpation.  Weight had been fairly stable; however, she has now lost about 6 pounds in the last 5 months.  Prior evaluation with EGD in July 2019 with normal esophagus, small hiatal hernia, mild gastritis without H. pylori, normal duodenum.  CT A/P with contrast February 2021 with no acute abnormalities.  She does have a small upper abdominal midline ventral hernia containing fat.  Previously seen by Dr. Arnoldo Morale in June 2020 due to known hernia.  He diagnosed her with diastasis recti with no recommendations for surgery.  Notably, her chronic GERD symptoms are well controlled on Dexilant.  No NSAID use.  She does have history of diabetes.  Query whether nausea with vomiting early satiety may be secondary to gastroparesis.  Doubt development of gastric outlet obstruction but cannot rule this out. Upper abdominal discomfort may also be influenced by hiatal hernia, ventral hernia, constipation, and possibly SIBO contributing to bloating.    Plan:  Upper GI series and gastric emptying study Follow a strict low-fat diet.  Avoid fried, fatty, greasy foods.  No fast foods.  All meats should be lean (poultry or fish) and baked, boiled, or broiled. Follow lactose-free diet. Try eating 4-6 small meals daily. No eating within 3 hours of laying down. Add MiraLAX 1 capful) 17 g) daily in 8 ounces of water. Continue Dexilant 60 mg daily. Continue Zofran as needed. Follow-up in 3 months.

## 2020-05-28 NOTE — Assessment & Plan Note (Addendum)
Chronic history of intermittent epigastric pain.  Overall, symptoms are improved but she continues to have tenderness to palpation in the epigastric area.  Additional upper GI symptoms include early satiety, daily nausea, intermittent vomiting, bloating, and a sensation that food just sits in her upper abdomen.  GERD is well controlled on Dexilant.  Notably, she is also struggling with constipation.  Prior evaluation with EGD in 2019 with mild gastritis without H. pylori and small hiatal hernia.  Was previously felt symptoms could be related to a hernia and she was referred to Dr. Arnoldo Morale who she saw in June 2020.  Dr. Arnoldo Morale diagnosed her with diastasis recti with no recommendations for surgery.  CT A/P with contrast February 2021 redemonstrated small upper abdominal midline ventral hernia containing fat.  No acute findings.   Etiology not entirely clear.  Could be multifactorial in the setting of hiatal hernia, ventral hernia, constipation, possible gastroparesis or development of gastric outlet obstruction contributing to additional upper GI symptoms.  SIBO causing abdominal distention is also in the differential.  Plan: Upper GI series and gastric emptying study Follow a strict low-fat diet.  Avoid fried, fatty, greasy foods.  No fast foods.  All meats should be lean (poultry or fish) and baked, boiled, or broiled. Follow lactose-free diet. Try eating 4-6 small meals daily. No eating within 3 hours of laying down. Add MiraLAX 1 capful) 17 g) daily in 8 ounces of water. Continue Dexilant 60 mg daily. Continue Zofran as needed. Follow-up in 3 months.

## 2020-05-28 NOTE — Patient Instructions (Addendum)
We will arrange for you to have an x-ray study with contrast to look at the anatomy of your upper GI tract as well as gastric emptying study.  This to help evaluate your nausea, vomiting, and early satiety.  Start MiraLAX 1 capful (17 g) daily in 8 ounces of water to help with constipation.  Continue Dexilant 60 mg daily.  Continue Zofran as needed for nausea or vomiting.  Continue to follow a strict low-fat diet.  Avoid fried, fatty, greasy foods.  No fast foods.  All meats should be lean (poultry or fish) and baked, boiled, or broiled.  Follow lactose-free diet.  Try eating 4-6 small meals daily.  Be sure not to eat within 3 hours of lying down.  We will plan to see you back in 3 months.  We will call you with results from your imaging studies.  Aliene Altes, PA-C [facility]

## 2020-05-28 NOTE — Progress Notes (Unsigned)
nm

## 2020-05-28 NOTE — Assessment & Plan Note (Signed)
Chronic.  Symptoms are well controlled on Dexilant 60 mg daily.  Advise she continue her current medications.  Follow-up in 3 months.

## 2020-05-29 ENCOUNTER — Telehealth (INDEPENDENT_AMBULATORY_CARE_PROVIDER_SITE_OTHER): Payer: Medicare Other | Admitting: Psychiatry

## 2020-05-29 ENCOUNTER — Telehealth: Payer: Self-pay

## 2020-05-29 ENCOUNTER — Encounter (HOSPITAL_COMMUNITY): Payer: Self-pay | Admitting: Psychiatry

## 2020-05-29 DIAGNOSIS — F331 Major depressive disorder, recurrent, moderate: Secondary | ICD-10-CM | POA: Diagnosis not present

## 2020-05-29 DIAGNOSIS — F4310 Post-traumatic stress disorder, unspecified: Secondary | ICD-10-CM | POA: Diagnosis not present

## 2020-05-29 NOTE — Telephone Encounter (Signed)
[place] at pre-service center called office [phone] ext [phone]) and LMOVM. Pt needs PA for GES from [facility] Medicare.  PA for GES submitted via [facility] website. Case approved. PA# [id number], valid 05/29/20-07/13/20.  Called and informed [name] [facility] [place] for [place].

## 2020-05-29 NOTE — Patient Instructions (Signed)
You will be discharged from the clinic based on your request. I would advise you will continue care with your primary care provider and/or another psychiatrist.

## 2020-06-02 ENCOUNTER — Encounter (HOSPITAL_COMMUNITY): Payer: Medicare Other

## 2020-06-09 ENCOUNTER — Ambulatory Visit (HOSPITAL_COMMUNITY): Admission: RE | Admit: 2020-06-09 | Payer: Medicare Other | Source: Ambulatory Visit

## 2020-06-16 ENCOUNTER — Other Ambulatory Visit: Payer: Self-pay | Admitting: Family Medicine

## 2020-06-16 DIAGNOSIS — Z720 Tobacco use: Secondary | ICD-10-CM

## 2020-06-22 ENCOUNTER — Other Ambulatory Visit: Payer: Self-pay | Admitting: Family Medicine

## 2020-06-22 DIAGNOSIS — Z1231 Encounter for screening mammogram for malignant neoplasm of breast: Secondary | ICD-10-CM

## 2020-07-01 ENCOUNTER — Other Ambulatory Visit: Payer: Self-pay

## 2020-07-01 ENCOUNTER — Encounter (HOSPITAL_COMMUNITY): Payer: Self-pay | Admitting: Physical Therapy

## 2020-07-01 ENCOUNTER — Ambulatory Visit (HOSPITAL_COMMUNITY): Payer: Medicare Other | Attending: Family Medicine | Admitting: Physical Therapy

## 2020-07-01 DIAGNOSIS — R29898 Other symptoms and signs involving the musculoskeletal system: Secondary | ICD-10-CM | POA: Insufficient documentation

## 2020-07-01 DIAGNOSIS — M542 Cervicalgia: Secondary | ICD-10-CM | POA: Insufficient documentation

## 2020-07-01 NOTE — Therapy (Signed)
[place], [place], [address] Phone: [phone]   Fax:  [phone]  Physical Therapy Evaluation  Patient Details  Name: Katelyn Kelly MRN: [id number] Date of Birth: 1961/03/28 Referring Provider (PT): Terrill Mohr MD   Encounter Date: 07/01/2020   PT End of Session - 07/01/20 1252    Visit Number 1    Number of Visits 8    Date for PT Re-Evaluation 07/29/20    Authorization Type [employer]  ( no v.l., no auth)    PT Start Time 1120    PT Stop Time 1200    PT Time Calculation (min) 40 min    Activity Tolerance Patient tolerated treatment well;Patient limited by pain    Behavior During Therapy [facility] for tasks assessed/performed           Past Medical History:  Diagnosis Date   Anxiety and depression    Bilateral carpal tunnel syndrome    Chronic back pain    Chronic neck pain    Depression    Diabetes mellitus    GERD (gastroesophageal reflux disease)    H/O syncope    Headache    Hypertension    IBS (irritable bowel syndrome)    Manic depression ([place])    Migraine    Panic attack     Past Surgical History:  Procedure Laterality Date   ABDOMINAL HYSTERECTOMY     ABDOMINAL SURGERY     APPENDECTOMY     BIOPSY  09/05/2017   Procedure: BIOPSY;  Surgeon: [name], MD;  [facility];  Service: Endoscopy;;  random colon   BIOPSY  05/01/2018   Procedure: BIOPSY;  Surgeon: [name], MD;  [facility];  Service: Endoscopy;;  gastric biopsy   CARPAL TUNNEL RELEASE Bilateral    CHOLECYSTECTOMY     COLONOSCOPY WITH PROPOFOL N/A 09/05/2017   Procedure: COLONOSCOPY WITH PROPOFOL;  Surgeon: [name], MD; normal TI, 5 small polyps, diverticulosis in the cecum, internal and external hemorrhoids.  Random colon biopsies benign.  Colon polyps were hyperplastic.  Repeat in 2023.   DILATION AND CURETTAGE OF UTERUS      ESOPHAGOGASTRODUODENOSCOPY (EGD) WITH PROPOFOL N/A 05/01/2018   Procedure: ESOPHAGOGASTRODUODENOSCOPY (EGD) WITH PROPOFOL;  Surgeon: [name], MD;  normal esophagus, small hiatal hernia, mild gastritis s/p biopsy, normal duodenum.  No H. pylori.    POLYPECTOMY  09/05/2017   Procedure: POLYPECTOMY;  Surgeon: [name], MD;  [facility];  Service: Endoscopy;;  sigmoid and rectal   TUBAL LIGATION      There were no vitals filed for this visit.    Subjective Assessment - 07/01/20 1120    Subjective Patient is a 59 y.o. female who presents to physical therapy with c/o neck stiffness which began about 6 months ago with insidious onset. Patient states she is having some neck stiffness and pain on L side. She sometimes has trouble turning her head. She has history of migraines. She has some knots in the muscle on the L side. Pain worse with turning her neck or lying on L side. Heating pads and pain patches dont help with pain. Shower water on it feels like needles hitting it. She denies UE symptoms. She also has back pain. Her main goal is to decrease her neck pain so she can turn her head.    Limitations Other (comment);House hold activities   turning head   Patient Stated  Goals to decrease her neck pain so she can turn her head    Currently in Pain? Yes    Pain Score 4     Pain Location Neck    Pain Orientation Left              [facility] PT Assessment - 07/01/20 0001      Assessment   Medical Diagnosis Neck stiffness    Referring Provider (PT) Terrill Mohr MD    Onset Date/Surgical Date 12/30/19    Next MD Visit Today    Prior Therapy Yes not for neck      Precautions   Precautions None      Restrictions   Weight Bearing Restrictions No      Balance Screen   Has the patient fallen in the past 6 months No    Has the patient had a decrease in activity level because of a fear of falling?  No    Is the patient reluctant to leave their home because of a fear of  falling?  No      Prior Function   Level of Independence Independent    Vocation On disability      Cognition   Overall Cognitive Status Within Functional Limits for tasks assessed      Observation/Other Assessments   Observations Patient ambulates without AD, guarded movements and posture, limited head movements    Focus on Therapeutic Outcomes (FOTO)  52% limtited      Sensation   Light Touch Appears Intact      Posture/Postural Control   Posture/Postural Control Postural limitations    Postural Limitations Rounded Shoulders;Forward head;Increased thoracic kyphosis      ROM / Strength   AROM / PROM / Strength AROM;Strength      AROM   AROM Assessment Site Cervical    Cervical Flexion 25% limited    Cervical Extension 90% limited, pain in L UT    Cervical - Right Side Bend 50% limited, stretch, pain in L UT    Cervical - Left Side Bend 75% limited, pain in L UT    Cervical - Right Rotation 0% limited    Cervical - Left Rotation 75% limited pain in L UT      Strength   Strength Assessment Site Shoulder;Elbow;Wrist;Hand    Right/Left Shoulder Right;Left    Right Shoulder ABduction 4+/5    Left Shoulder ABduction 4+/5    Right/Left Elbow Right;Left    Right Elbow Flexion 5/5    Right Elbow Extension 5/5    Left Elbow Flexion 5/5    Left Elbow Extension 4+/5    Right/Left Wrist Right;Left    Right Wrist Flexion 5/5    Right Wrist Extension 5/5    Left Wrist Flexion 5/5    Left Wrist Extension 5/5    Right/Left hand Right;Left    Right Hand Gross Grasp Functional    Left Hand Gross Grasp Functional      Palpation   Spinal mobility hypomobile and painful throughout c/sp with light touch    Palpation comment TTP bilateral UT and paraspinals, and sub occipitals, triggerpoints throughout; TTP L SCM, scalanes                      Objective measurements completed on examination: See above findings.       [facility] Adult PT Treatment/Exercise - 07/01/20 0001       Exercises   Exercises Neck      Neck  Exercises: Seated   Other Seated Exercise Upper trap stretch 3x 20 second holds for L UT                  PT Education - 07/01/20 1127    Education Details Patient educated on exam findings, POC, scope of PT, initial HEP    Person(s) Educated Patient    Methods Explanation;Demonstration;Handout    Comprehension Verbalized understanding;Returned demonstration            PT Short Term Goals - 07/01/20 1313      PT SHORT TERM GOAL #1   Title Patient will be independent with HEP in order to improve functional outcomes.    Time 2    Period Weeks    Status New    Target Date 07/15/20      PT SHORT TERM GOAL #2   Title Patient will report at least 25% improvement in symptoms for improved quality of life.    Time 2    Period Weeks    Status New    Target Date 07/15/20             PT Long Term Goals - 07/01/20 1313      PT LONG TERM GOAL #1   Title Patient will report at least 75% improvement in symptoms for improved quality of life.    Time 4    Period Weeks    Status New    Target Date 07/29/20      PT LONG TERM GOAL #2   Title Patient will improve FOTO score by at least 5 points in order to indicate improved tolerance to activity.    Time 4    Period Weeks    Status New    Target Date 07/29/20      PT LONG TERM GOAL #3   Title Patient will demonstrate at least 25% improvement in cervical ROM in all planes for improved ability to move head while driving    Time 4    Period Weeks    Status New    Target Date 07/29/20                  Plan - 07/01/20 1256    Clinical Impression Statement Patient is a 59 y.o. female who presents to physical therapy with c/o neck stiffness which began about 6 months ago with insidious onset. She presents with pain limited deficits in cervical spine strength, ROM, endurance, postural impairments, spinal mobility and functional mobility with ADL. She is having to modify  and restrict ADL as indicated by FOTO score as well as subjective information and objective measures which is affecting overall participation. Patient will benefit from skilled physical therapy in order to improve function and reduce impairment.    Personal Factors and Comorbidities Comorbidity 3+;Past/Current Experience;Time since onset of injury/illness/exacerbation;Fitness    Comorbidities Anxiety, Arthritis, Back pain, BMI over 30,  Depression, Diabetes Type,  Headaches    Examination-Activity Limitations Bend;Bed Mobility;Lift;Sleep    Examination-Participation Restrictions Cleaning;Driving;Laundry;Yard Work    Merchant [employer] officer Stable/Uncomplicated    Designer, jewellery Low    Rehab Potential Fair    PT Frequency 2x / week    PT Duration 4 weeks    PT Treatment/Interventions ADLs/Self Care Home Management;Aquatic Therapy;Cryotherapy;Electrical Stimulation;Iontophoresis 4mg /ml Dexamethasone;Moist Heat;Traction;DME Instruction;Gait training;Stair training;Therapeutic exercise;Functional mobility training;Therapeutic activities;Balance training;Neuromuscular re-education;Patient/family education;Manual techniques;Orthotic Fit/Training;Passive range of motion;Spinal Manipulations;Dry needling;Joint Manipulations;Energy conservation;Splinting;Taping    PT Next Visit Plan Possibly begin manual for pain relief and  mobility for cervical spine and STM for UT/paraspinals; Possibly begin cervical retraction and further c/sp stretches, postural strengthening as able    PT Home Exercise Plan 9/1 UT stretch    Consulted and Agree with Plan of Care Patient           Patient will benefit from skilled therapeutic intervention in order to improve the following deficits and impairments:  Decreased range of motion, Decreased activity tolerance, Decreased mobility, Increased muscle spasms, Impaired perceived functional ability, Impaired flexibility, Hypomobility, Pain  Visit  Diagnosis: Cervicalgia  Other symptoms and signs involving the musculoskeletal system     Problem List Patient Active Problem List   Diagnosis Date Noted   Early satiety 05/28/2020   Panic disorder 12/11/2019   Flank pain 12/09/2019   Diastasis recti 04/11/2019   Intermittent upper abdominal pain 03/28/2019   MDD (major depressive disorder), recurrent episode, moderate ([place]) 11/26/2018   PTSD (post-traumatic stress disorder) 11/26/2018   Nausea with vomiting 02/14/2018   GERD (gastroesophageal reflux disease) 11/08/2017   Dysphagia 11/08/2017   Taking multiple medications for chronic disease 08/02/2017   Family history of colon cancer 08/02/2017   IBS (irritable bowel syndrome) 08/02/2017   LATERAL EPICONDYLITIS 08/27/2009   JOINT PAIN, HAND 07/30/2009   TRIGGER FINGER 07/30/2009   CARPAL TUNNEL SYNDROME 03/04/2009   CERVICAL RADICULITIS 03/04/2009   NUMBNESS, HAND 02/02/2009    1:17 PM, 07/01/20 Mearl Latin PT, DPT Physical Therapist at [facility] [address] [place], [place], [address] Phone: [phone]   Fax:  [phone]  Name: Katelyn Kelly MRN: [id number] Date of Birth: 1960-12-09

## 2020-07-01 NOTE — Patient Instructions (Signed)
Access Code: [email or web address] URL: [email or web address] Date: 07/01/2020 Prepared by: Mitzi Hansen Tayvien Kane  Exercises Seated Upper Trapezius Stretch - 1 x daily - 7 x weekly - 3 reps - 20 second hold

## 2020-07-07 ENCOUNTER — Ambulatory Visit (HOSPITAL_COMMUNITY): Payer: Medicare Other | Admitting: Physical Therapy

## 2020-07-07 ENCOUNTER — Other Ambulatory Visit: Payer: Self-pay

## 2020-07-07 ENCOUNTER — Encounter (HOSPITAL_COMMUNITY): Payer: Self-pay | Admitting: Physical Therapy

## 2020-07-07 DIAGNOSIS — M542 Cervicalgia: Secondary | ICD-10-CM

## 2020-07-07 DIAGNOSIS — R29898 Other symptoms and signs involving the musculoskeletal system: Secondary | ICD-10-CM

## 2020-07-07 NOTE — Therapy (Signed)
Katelyn Kelly, [place], [address] Phone: [phone]   Fax:  [phone]  Physical Therapy Treatment  Patient Details  Name: Katelyn Kelly MRN: [id number] Date of Birth: 15-Feb-1961 Referring Provider (PT): Terrill Mohr MD   Encounter Date: 07/07/2020   PT End of Session - 07/07/20 0909    Visit Number 2    Number of Visits 8    Date for PT Re-Evaluation 07/29/20    Authorization Type [employer]  ( no v.l., no auth)    PT Start Time 0915    PT Stop Time 0955    PT Time Calculation (min) 40 min    Activity Tolerance Patient tolerated treatment well;Patient limited by pain    Behavior During Therapy [facility] for tasks assessed/performed           Past Medical History:  Diagnosis Date  . Anxiety and depression   . Bilateral carpal tunnel syndrome   . Chronic back pain   . Chronic neck pain   . Depression   . Diabetes mellitus   . GERD (gastroesophageal reflux disease)   . H/O syncope   . Headache   . Hypertension   . IBS (irritable bowel syndrome)   . Manic depression ([place])   . Migraine   . Panic attack     Past Surgical History:  Procedure Laterality Date  . ABDOMINAL HYSTERECTOMY    . ABDOMINAL SURGERY    . APPENDECTOMY    . BIOPSY  09/05/2017   Procedure: BIOPSY;  Surgeon: [name], MD;  [facility];  Service: Endoscopy;;  random colon  . BIOPSY  05/01/2018   Procedure: BIOPSY;  Surgeon: [name], MD;  [facility];  Service: Endoscopy;;  gastric biopsy  . CARPAL TUNNEL RELEASE Bilateral   . CHOLECYSTECTOMY    . COLONOSCOPY WITH PROPOFOL N/A 09/05/2017   Procedure: COLONOSCOPY WITH PROPOFOL;  Surgeon: [name], MD; normal TI, 5 small polyps, diverticulosis in the cecum, internal and external hemorrhoids.  Random colon biopsies benign.  Colon polyps were hyperplastic.  Repeat in 2023.  Marland Kitchen DILATION AND CURETTAGE OF UTERUS    .  ESOPHAGOGASTRODUODENOSCOPY (EGD) WITH PROPOFOL N/A 05/01/2018   Procedure: ESOPHAGOGASTRODUODENOSCOPY (EGD) WITH PROPOFOL;  Surgeon: [name], MD;  normal esophagus, small hiatal hernia, mild gastritis s/p biopsy, normal duodenum.  No H. pylori.   Marland Kitchen POLYPECTOMY  09/05/2017   Procedure: POLYPECTOMY;  Surgeon: [name], MD;  [facility];  Service: Endoscopy;;  sigmoid and rectal  . TUBAL LIGATION      There were no vitals filed for this visit.   Subjective Assessment - 07/07/20 0917    Subjective States that she currently has pain on left side of the neck with 5/10 reported as tight. States she been doing her exercises but they haven't seemed to help yet.    Limitations Other (comment);House hold activities   turning head   Patient Stated Goals to decrease her neck pain so she can turn her head    Currently in Pain? Yes    Pain Location Neck    Pain Orientation Left    Pain Descriptors / Indicators Tightness    Pain Type Chronic pain              [facility] PT Assessment - 07/07/20 0001      Assessment   Medical Diagnosis Neck stiffness    Referring Provider (PT) Aldona Bar  Eksir MD    Onset Date/Surgical Date 12/30/19                         [facility] Adult PT Treatment/Exercise - 07/07/20 0001      Neck Exercises: Seated   Cervical Isometrics Right lateral flexion;Left lateral flexion;5 secs;5 reps;Right rotation;Left rotation   4 sets each    Other Seated Exercise seated posture exercise - 5 minutes      Neck Exercises: Supine   Other Supine Exercise cervical PROM ROT 5 minutes - gentle and rhythmic       Manual Therapy   Manual Therapy Joint mobilization;Soft tissue mobilization    Manual therapy comments all manual interventions performed independently of other interventions    Joint Mobilization gentle grade II mobs to cervical spine L medial glide - not tolerated well.     Soft tissue mobilization Gentle STM to cervical paraspinals,  suboccipital - not tolerated well                  PT Education - 07/07/20 0949    Education Details on posture, on how it can contribute to her current pain situation and neck pain. on HEP    Person(s) Educated Patient    Methods Explanation    Comprehension Verbalized understanding            PT Short Term Goals - 07/01/20 1313      PT SHORT TERM GOAL #1   Title Patient will be independent with HEP in order to improve functional outcomes.    Time 2    Period Weeks    Status New    Target Date 07/15/20      PT SHORT TERM GOAL #2   Title Patient will report at least 25% improvement in symptoms for improved quality of life.    Time 2    Period Weeks    Status New    Target Date 07/15/20             PT Long Term Goals - 07/01/20 1313      PT LONG TERM GOAL #1   Title Patient will report at least 75% improvement in symptoms for improved quality of life.    Time 4    Period Weeks    Status New    Target Date 07/29/20      PT LONG TERM GOAL #2   Title Patient will improve FOTO score by at least 5 points in order to indicate improved tolerance to activity.    Time 4    Period Weeks    Status New    Target Date 07/29/20      PT LONG TERM GOAL #3   Title Patient will demonstrate at least 25% improvement in cervical ROM in all planes for improved ability to move head while driving    Time 4    Period Weeks    Status New    Target Date 07/29/20                 Plan - 07/07/20 0948    Clinical Impression Statement During session reported she didn't know if her neck problems were due to all the times she was chocked. States last time she was chocked was over a year ago which left bruises on her (she did not have to go to the hospital). PT inquired if she was still in the same situation where she could get chocked and  patient reported she was not. Manual interventions noted tolerated well, transitioned to seated isometrics and these were tolerated  better. To note, patient also has chronic back pain and 3 abdominal hernia's that is likely contributing to current condition and patient would benefit from sore strengthening and posture exercises at address neck pain. Reported 4/10 pain in the neck.    Personal Factors and Comorbidities Comorbidity 3+;Past/Current Experience;Time since onset of injury/illness/exacerbation;Fitness    Comorbidities Anxiety, Arthritis, Back pain, BMI over 30,  Depression, Diabetes Type,  Headaches    Examination-Activity Limitations Bend;Bed Mobility;Lift;Sleep    Examination-Participation Restrictions Cleaning;Driving;Laundry;Yard Work    Stability/Clinical Decision Making Stable/Uncomplicated    Rehab Potential Fair    PT Frequency 2x / week    PT Duration 4 weeks    PT Treatment/Interventions ADLs/Self Care Home Management;Aquatic Therapy;Cryotherapy;Electrical Stimulation;Iontophoresis 4mg /ml Dexamethasone;Moist Heat;Traction;DME Instruction;Gait training;Stair training;Therapeutic exercise;Functional mobility training;Therapeutic activities;Balance training;Neuromuscular re-education;Patient/family education;Manual techniques;Orthotic Fit/Training;Passive range of motion;Spinal Manipulations;Dry needling;Joint Manipulations;Energy conservation;Splinting;Taping    PT Next Visit Plan posture, core strengthening. Possibly begin manual for pain relief and mobility for cervical spine and STM for UT/paraspinals; Possibly begin cervical retraction and further c/sp stretches, postural strengthening as able    PT Home Exercise Plan 9/1 UT stretch    Consulted and Agree with Plan of Care Patient           Patient will benefit from skilled therapeutic intervention in order to improve the following deficits and impairments:  Decreased range of motion, Decreased activity tolerance, Decreased mobility, Increased muscle spasms, Impaired perceived functional ability, Impaired flexibility, Hypomobility, Pain  Visit  Diagnosis: Cervicalgia  Other symptoms and signs involving the musculoskeletal system     Problem List Patient Active Problem List   Diagnosis Date Noted  . Early satiety 05/28/2020  . Panic disorder 12/11/2019  . Flank pain 12/09/2019  . Diastasis recti 04/11/2019  . Intermittent upper abdominal pain 03/28/2019  . MDD (major depressive disorder), recurrent episode, moderate ([place]) 11/26/2018  . PTSD (post-traumatic stress disorder) 11/26/2018  . Nausea with vomiting 02/14/2018  . GERD (gastroesophageal reflux disease) 11/08/2017  . Dysphagia 11/08/2017  . Taking multiple medications for chronic disease 08/02/2017  . Family history of colon cancer 08/02/2017  . IBS (irritable bowel syndrome) 08/02/2017  . LATERAL EPICONDYLITIS 08/27/2009  . JOINT PAIN, HAND 07/30/2009  . TRIGGER FINGER 07/30/2009  . CARPAL TUNNEL SYNDROME 03/04/2009  . CERVICAL RADICULITIS 03/04/2009  . NUMBNESS, HAND 02/02/2009   9:57 AM, 07/07/20 Jerene Pitch, DPT Physical Therapy with [facility]  [phone] office  [place] [address] [place], [place], [address] Phone: [phone]   Fax:  [phone]  Name: CHARLISHA MARKET MRN: [id number] Date of Birth: 30-Oct-1961

## 2020-07-08 ENCOUNTER — Ambulatory Visit (HOSPITAL_COMMUNITY): Payer: Medicare Other | Admitting: Physical Therapy

## 2020-07-08 ENCOUNTER — Encounter (HOSPITAL_COMMUNITY): Payer: Self-pay | Admitting: Physical Therapy

## 2020-07-08 ENCOUNTER — Telehealth (HOSPITAL_COMMUNITY): Payer: Self-pay | Admitting: Physical Therapy

## 2020-07-08 DIAGNOSIS — R29898 Other symptoms and signs involving the musculoskeletal system: Secondary | ICD-10-CM

## 2020-07-08 DIAGNOSIS — M542 Cervicalgia: Secondary | ICD-10-CM | POA: Diagnosis not present

## 2020-07-08 NOTE — Telephone Encounter (Signed)
l/m offered early apptment time today at 2:30 or 4pm with [facility].

## 2020-07-08 NOTE — Therapy (Signed)
[place], [place], [address] Phone: [phone]   Fax:  [phone]  Physical Therapy Treatment  Patient Details  Name: Katelyn Kelly MRN: [id number] Date of Birth: 07-Dec-1960 Referring Provider (PT): Terrill Mohr MD   Encounter Date: 07/08/2020   PT End of Session - 07/08/20 1437    Visit Number 3    Number of Visits 8    Date for PT Re-Evaluation 07/29/20    Authorization Type [employer]  ( no v.l., no auth)    PT Start Time 1432    PT Stop Time 1512    PT Time Calculation (min) 40 min    Activity Tolerance Patient tolerated treatment well    Behavior During Therapy [facility] for tasks assessed/performed           Past Medical History:  Diagnosis Date  . Anxiety and depression   . Bilateral carpal tunnel syndrome   . Chronic back pain   . Chronic neck pain   . Depression   . Diabetes mellitus   . GERD (gastroesophageal reflux disease)   . H/O syncope   . Headache   . Hypertension   . IBS (irritable bowel syndrome)   . Manic depression ([place])   . Migraine   . Panic attack     Past Surgical History:  Procedure Laterality Date  . ABDOMINAL HYSTERECTOMY    . ABDOMINAL SURGERY    . APPENDECTOMY    . BIOPSY  09/05/2017   Procedure: BIOPSY;  Surgeon: [name], MD;  [facility];  Service: Endoscopy;;  random colon  . BIOPSY  05/01/2018   Procedure: BIOPSY;  Surgeon: [name], MD;  [facility];  Service: Endoscopy;;  gastric biopsy  . CARPAL TUNNEL RELEASE Bilateral   . CHOLECYSTECTOMY    . COLONOSCOPY WITH PROPOFOL N/A 09/05/2017   Procedure: COLONOSCOPY WITH PROPOFOL;  Surgeon: [name], MD; normal TI, 5 small polyps, diverticulosis in the cecum, internal and external hemorrhoids.  Random colon biopsies benign.  Colon polyps were hyperplastic.  Repeat in 2023.  Marland Kitchen DILATION AND CURETTAGE OF UTERUS    . ESOPHAGOGASTRODUODENOSCOPY (EGD) WITH PROPOFOL  N/A 05/01/2018   Procedure: ESOPHAGOGASTRODUODENOSCOPY (EGD) WITH PROPOFOL;  Surgeon: [name], MD;  normal esophagus, small hiatal hernia, mild gastritis s/p biopsy, normal duodenum.  No H. pylori.   Marland Kitchen POLYPECTOMY  09/05/2017   Procedure: POLYPECTOMY;  Surgeon: [name], MD;  [facility];  Service: Endoscopy;;  sigmoid and rectal  . TUBAL LIGATION      There were no vitals filed for this visit.   Subjective Assessment - 07/08/20 1435    Subjective Patient reports ongoing neck stiffness. Says she has been doing neck stretches from HEP daily. Says it burns when she does them. Says she just started these so hasn't shown improvements yet.    Limitations Other (comment);House hold activities   turning head   Patient Stated Goals to decrease her neck pain so she can turn her head    Currently in Pain? Yes    Pain Score 5     Pain Location Neck    Pain Orientation Left    Pain Descriptors / Indicators Tightness    Pain Type Chronic pain                             [facility] Adult PT Treatment/Exercise -  07/08/20 0001      Neck Exercises: Seated   Neck Retraction 20 reps    Other Seated Exercise shoulder squeeze 20 x 5"     Other Seated Exercise Cervical 3D excursions x10 each       Neck Exercises: Stretches   Upper Trapezius Stretch Right;Left;3 reps;30 seconds    Levator Stretch Right;Left;3 reps;30 seconds                    PT Short Term Goals - 07/01/20 1313      PT SHORT TERM GOAL #1   Title Patient will be independent with HEP in order to improve functional outcomes.    Time 2    Period Weeks    Status New    Target Date 07/15/20      PT SHORT TERM GOAL #2   Title Patient will report at least 25% improvement in symptoms for improved quality of life.    Time 2    Period Weeks    Status New    Target Date 07/15/20             PT Long Term Goals - 07/01/20 1313      PT LONG TERM GOAL #1   Title Patient will report  at least 75% improvement in symptoms for improved quality of life.    Time 4    Period Weeks    Status New    Target Date 07/29/20      PT LONG TERM GOAL #2   Title Patient will improve FOTO score by at least 5 points in order to indicate improved tolerance to activity.    Time 4    Period Weeks    Status New    Target Date 07/29/20      PT LONG TERM GOAL #3   Title Patient will demonstrate at least 25% improvement in cervical ROM in all planes for improved ability to move head while driving    Time 4    Period Weeks    Status New    Target Date 07/29/20                 Plan - 07/08/20 1510    Clinical Impression Statement Patient tolerated session well overall today. Patient was muscle guarding initially and had increased discomfort with ROM activity. Educated patient on performing exercise through pain free/ tolerable range. Educated Patient on spine mechanics and importance of correct posturing to reduce stress to neck area. Added cervical excursions for ROM, added chin tuck and scap retractions for postural strengthening. Discussed HEP exercise with patient, she mentioned she was holding neck stretches for several minutes. Discussed this was likely contributing to increased discomfort/ burning sensation , and to perform about 30 sec holds. Patient did much better under these parameters in session today. Patient noted increased pain free cervical AROM post treatment today. Educated patient on and issued updated HEP handout.    Personal Factors and Comorbidities Comorbidity 3+;Past/Current Experience;Time since onset of injury/illness/exacerbation;Fitness    Comorbidities Anxiety, Arthritis, Back pain, BMI over 30,  Depression, Diabetes Type,  Headaches    Examination-Activity Limitations Bend;Bed Mobility;Lift;Sleep    Examination-Participation Restrictions Cleaning;Driving;Laundry;Yard Work    Stability/Clinical Decision Making Stable/Uncomplicated    Rehab Potential Fair     PT Frequency 2x / week    PT Duration 4 weeks    PT Treatment/Interventions ADLs/Self Care Home Management;Aquatic Therapy;Cryotherapy;Electrical Stimulation;Iontophoresis 4mg /ml Dexamethasone;Moist Heat;Traction;DME Instruction;Gait training;Stair training;Therapeutic exercise;Functional mobility training;Therapeutic activities;Balance training;Neuromuscular  re-education;Patient/family education;Manual techniques;Orthotic Fit/Training;Passive range of motion;Spinal Manipulations;Dry needling;Joint Manipulations;Energy conservation;Splinting;Taping    PT Next Visit Plan posture, core strengthening.  Manual for pain relief and mobility for cervical spine and STM for UT/paraspinals PRN    PT Home Exercise Plan 9/1 UT stretch 07/08/20: scap retraction, levator stretch, chin tuck    Consulted and Agree with Plan of Care Patient           Patient will benefit from skilled therapeutic intervention in order to improve the following deficits and impairments:  Decreased range of motion, Decreased activity tolerance, Decreased mobility, Increased muscle spasms, Impaired perceived functional ability, Impaired flexibility, Hypomobility, Pain  Visit Diagnosis: Cervicalgia  Other symptoms and signs involving the musculoskeletal system     Problem List Patient Active Problem List   Diagnosis Date Noted  . Early satiety 05/28/2020  . Panic disorder 12/11/2019  . Flank pain 12/09/2019  . Diastasis recti 04/11/2019  . Intermittent upper abdominal pain 03/28/2019  . MDD (major depressive disorder), recurrent episode, moderate ([place]) 11/26/2018  . PTSD (post-traumatic stress disorder) 11/26/2018  . Nausea with vomiting 02/14/2018  . GERD (gastroesophageal reflux disease) 11/08/2017  . Dysphagia 11/08/2017  . Taking multiple medications for chronic disease 08/02/2017  . Family history of colon cancer 08/02/2017  . IBS (irritable bowel syndrome) 08/02/2017  . LATERAL EPICONDYLITIS 08/27/2009  . JOINT  PAIN, HAND 07/30/2009  . TRIGGER FINGER 07/30/2009  . CARPAL TUNNEL SYNDROME 03/04/2009  . CERVICAL RADICULITIS 03/04/2009  . NUMBNESS, HAND 02/02/2009    3:19 PM, 07/08/20 Josue Hector PT DPT  Physical Therapist with [facility]  [phone] [place] [address] [place], [place], [address] Phone: [phone]   Fax:  [phone]  Name: Katelyn Kelly MRN: [id number] Date of Birth: [birth year]

## 2020-07-08 NOTE — Patient Instructions (Signed)
Access Code: [email or web address] URL: [email or web address] Date: 07/08/2020 Prepared by: Josue Hector  Exercises Seated Cervical Retraction - 2-3 x daily - 7 x weekly - 2 sets - 10 reps Seated Scapular Retraction - 2-3 x daily - 7 x weekly - 2 sets - 10 reps - 5 seconds hold Gentle Levator Scapulae Stretch - 2-3 x daily - 7 x weekly - 1 sets - 3 reps - 30 seconds hold

## 2020-07-15 ENCOUNTER — Ambulatory Visit (HOSPITAL_COMMUNITY): Payer: Medicare Other

## 2020-07-16 ENCOUNTER — Ambulatory Visit (HOSPITAL_COMMUNITY): Payer: Medicare Other | Admitting: Physical Therapy

## 2020-07-16 ENCOUNTER — Telehealth (HOSPITAL_COMMUNITY): Payer: Self-pay | Admitting: Physical Therapy

## 2020-07-16 NOTE — Telephone Encounter (Signed)
pt cancelled appt because she has been in the bed with a migraine for 2 days

## 2020-07-20 ENCOUNTER — Telehealth (HOSPITAL_COMMUNITY): Payer: Self-pay | Admitting: Physical Therapy

## 2020-07-20 NOTE — Telephone Encounter (Signed)
pt cancelled appt for 9/21 because she has another MD appt

## 2020-07-21 ENCOUNTER — Encounter (HOSPITAL_COMMUNITY): Payer: Medicare Other | Admitting: Physical Therapy

## 2020-07-22 ENCOUNTER — Other Ambulatory Visit: Payer: Self-pay | Admitting: Family Medicine

## 2020-07-22 ENCOUNTER — Other Ambulatory Visit: Payer: Self-pay

## 2020-07-22 ENCOUNTER — Ambulatory Visit
Admission: RE | Admit: 2020-07-22 | Discharge: 2020-07-22 | Disposition: A | Discharge status: Home / Self Care | Payer: Medicare Other | Source: Ambulatory Visit | Attending: Family Medicine | Admitting: Family Medicine

## 2020-07-22 DIAGNOSIS — N6452 Nipple discharge: Secondary | ICD-10-CM

## 2020-07-22 DIAGNOSIS — Z1231 Encounter for screening mammogram for malignant neoplasm of breast: Secondary | ICD-10-CM

## 2020-07-23 ENCOUNTER — Telehealth (HOSPITAL_COMMUNITY): Payer: Self-pay | Admitting: Physical Therapy

## 2020-07-23 ENCOUNTER — Ambulatory Visit (HOSPITAL_COMMUNITY): Payer: Medicare Other | Admitting: Physical Therapy

## 2020-07-23 NOTE — Telephone Encounter (Signed)
Pt did not show for appt NS#1.  Unable to reach pt by phone  Teena Irani, PTA/CLT [phone]

## 2020-07-28 ENCOUNTER — Ambulatory Visit (HOSPITAL_COMMUNITY): Payer: Medicare Other | Admitting: Physical Therapy

## 2020-07-28 ENCOUNTER — Telehealth (HOSPITAL_COMMUNITY): Payer: Self-pay | Admitting: Physical Therapy

## 2020-07-28 NOTE — Telephone Encounter (Signed)
No Show #2. Called and left voicemail about missed apt and about upcoming apt. If patient does not show up to next apt patient will be discharged secondary to no show/cancellation policy.   11:07 AM, 07/28/20 Katelyn Kelly, DPT Physical Therapy with [facility]  [phone] office

## 2020-07-30 ENCOUNTER — Telehealth (HOSPITAL_COMMUNITY): Payer: Self-pay | Admitting: Physical Therapy

## 2020-07-30 ENCOUNTER — Encounter (HOSPITAL_COMMUNITY): Payer: Self-pay | Admitting: Physical Therapy

## 2020-07-30 ENCOUNTER — Ambulatory Visit (HOSPITAL_COMMUNITY): Payer: Medicare Other | Admitting: Physical Therapy

## 2020-07-30 NOTE — Telephone Encounter (Signed)
Patient no show #3. Left message for patient making her aware of missed appointment and let her know she would be discharged per attendance policy. Instructed patient that she could get a new PT order if she wishes to return.  11:56 AM, 07/30/20 Mearl Latin PT, DPT Physical Therapist at [facility]

## 2020-07-30 NOTE — Therapy (Signed)
[place], [place], [address] Phone: [phone]   Fax:  [phone]  Patient Details  Name: Katelyn Kelly MRN: [id number] Date of Birth: [birth year] Referring Provider:  No ref. provider found  Encounter Date: 07/30/2020   PHYSICAL THERAPY DISCHARGE SUMMARY  Visits from Start of Care: 3  Current functional level related to goals / functional outcomes: Unable to assess as patient has not returned.   Remaining deficits: Unable to reassess.   Education / Equipment: HEP  Plan: Patient agrees to discharge.  Patient goals were not met. Patient is being discharged due to not returning since the last visit.  ?????       11:57 AM, 07/30/20 Mearl Latin PT, DPT Physical Therapist at [place], [place], [address] Phone: [phone]   Fax:  [phone]

## 2020-08-18 ENCOUNTER — Ambulatory Visit: Payer: Medicare Other

## 2020-08-18 ENCOUNTER — Other Ambulatory Visit: Payer: Self-pay

## 2020-08-18 ENCOUNTER — Ambulatory Visit
Admission: RE | Admit: 2020-08-18 | Discharge: 2020-08-18 | Disposition: A | Discharge status: Home / Self Care | Payer: Medicare Other | Source: Ambulatory Visit | Attending: Family Medicine | Admitting: Family Medicine

## 2020-08-18 DIAGNOSIS — N6452 Nipple discharge: Secondary | ICD-10-CM

## 2020-08-27 NOTE — Progress Notes (Deleted)
Referring Provider: Nickola Major, MD Primary Care Physician:  Nickola Major, MD Primary GI Physician: Dr. Abbey Chatters  No chief complaint on file.   HPI:   Katelyn Kelly is a 59 y.o. female with history of IBS historically diarrhea predominant, GERD, cholecystectomy. EGD in July 2019 with normal esophagus, small hiatal hernia, mild gastritis s/p biopsy, normal duodenum. No H. pylori. Colonoscopy up-to-date in November 2018 with normal TI, 5 small polyps, diverticulosis in the cecum, internal and external hemorrhoids. Random colon biopsies benign. Colon polyps were hyperplastic. Recommended repeat colonoscopy in 5 years.  More recently left flank/left back pain.  Prior evaluation with CT A/P with contrast February 2021 with no acute abnormalities and felt left-sided pain was likely musculoskeletal.  Notably, she had also reported improvement with Toradol previously.  Also with intermittent upper abdominal pain, early satiety, bloating, and nausea with occasional vomiting.   She was last seen 05/28/2020.  Felt epigastric pain was improving but continued with early satiety, daily nausea, intermittent vomiting, bloating sensation.  GERD was well controlled on Dexilant.  She is also struggling with constipation.  Suspected multifactorial etiology of upper GI symptoms including hiatal hernia, ventral hernia, constipation, possible gastroparesis versus gastric outlet obstruction versus SIBO.  Plan for upper GI series, GES, low-fat/lactose-free diet, MiraLAX daily, continue Dexilant and Zofran, follow-up in 3 months.  Upper GI series nor gastric emptying study were completed.   Today:   Past Medical History:  Diagnosis Date  . Anxiety and depression   . Bilateral carpal tunnel syndrome   . Chronic back pain   . Chronic neck pain   . Depression   . Diabetes mellitus   . GERD (gastroesophageal reflux disease)   . H/O syncope   . Headache   . Hypertension   . IBS (irritable bowel  syndrome)   . Manic depression ([place])   . Migraine   . Panic attack     Past Surgical History:  Procedure Laterality Date  . ABDOMINAL HYSTERECTOMY    . ABDOMINAL SURGERY    . APPENDECTOMY    . BIOPSY  09/05/2017   Procedure: BIOPSY;  Surgeon: [name], MD;  Location: [facility];  Service: Endoscopy;;  random colon  . BIOPSY  05/01/2018   Procedure: BIOPSY;  Surgeon: [name], MD;  Location: [facility];  Service: Endoscopy;;  gastric biopsy  . CARPAL TUNNEL RELEASE Bilateral   . CHOLECYSTECTOMY    . COLONOSCOPY WITH PROPOFOL N/A 09/05/2017   Procedure: COLONOSCOPY WITH PROPOFOL;  Surgeon: [name], MD; normal TI, 5 small polyps, diverticulosis in the cecum, internal and external hemorrhoids.  Random colon biopsies benign.  Colon polyps were hyperplastic.  Repeat in 2023.  Marland Kitchen DILATION AND CURETTAGE OF UTERUS    . ESOPHAGOGASTRODUODENOSCOPY (EGD) WITH PROPOFOL N/A 05/01/2018   Procedure: ESOPHAGOGASTRODUODENOSCOPY (EGD) WITH PROPOFOL;  Surgeon: [name], MD;  normal esophagus, small hiatal hernia, mild gastritis s/p biopsy, normal duodenum.  No H. pylori.   Marland Kitchen POLYPECTOMY  09/05/2017   Procedure: POLYPECTOMY;  Surgeon: [name], MD;  Location: [facility];  Service: Endoscopy;;  sigmoid and rectal  . TUBAL LIGATION      Current Outpatient Medications  Medication Sig Dispense Refill  . ALPRAZolam (XANAX) 0.5 MG tablet Take 1 tablet (0.5 mg total) by mouth 2 (two) times daily as needed for anxiety. 60 tablet 1  . Black Cohosh 40 MG CAPS Take by mouth daily.    Marland Kitchen  dexlansoprazole (DEXILANT) 60 MG capsule 1 PO EVERY MORNING WITH BREAKFAST. 30 capsule 11  . DULoxetine (CYMBALTA) 60 MG capsule Take 2 capsules (120 mg total) by mouth daily. (Patient taking differently: Take 60 mg by mouth daily. Takes 180 mg at night.) 180 capsule 0  . insulin lispro (HUMALOG) 100 UNIT/ML KwikPen Inject 2 Units into the skin 3 (three) times daily.     Marland Kitchen lidocaine (LIDODERM) 5  % Place 1 patch onto the skin daily as needed. Apply patch to area most significant pain once per day.  Remove and discard patch within 12 hours of application. 30 patch 0  . liraglutide (VICTOZA) 18 MG/3ML SOPN Inject 1.8 mg into the skin in the morning. Takes 1.8 units in the morning     . ondansetron (ZOFRAN-ODT) 4 MG disintegrating tablet Take 1 tablet (4 mg total) by mouth every 8 (eight) hours as needed for nausea or vomiting. 30 tablet 2  . pramipexole (MIRAPEX) 0.125 MG tablet Take 0.125 mg by mouth at bedtime.     Marland Kitchen PROAIR HFA 108 (90 Base) MCG/ACT inhaler Inhale 1-2 puffs into the lungs every 6 (six) hours as needed for wheezing or shortness of breath.  (Patient not taking: Reported on 05/28/2020)    . SUMAtriptan (IMITREX) 50 MG tablet Take 1 tablet by mouth as directed.    . topiramate (TOPAMAX) 25 MG tablet Take 50 mg by mouth 2 (two) times daily.    . TRESIBA FLEXTOUCH 100 UNIT/ML SOPN FlexTouch Pen Inject 16 Units into the skin at bedtime.      No current facility-administered medications for this visit.    Allergies as of 08/28/2020 - Review Complete 07/08/2020  Allergen Reaction Noted  . Sinequan [doxepin hcl] Anaphylaxis 01/10/2012  . Albuterol Other (See Comments) 11/08/2017  . Haloperidol Other (See Comments) 11/08/2017  . Methylphenidate derivatives Hives 07/28/2013  . Sertraline Other (See Comments) 11/08/2017  . Tranxene [clorazepate dipotassium] Hives and Swelling 01/10/2012  . Vortioxetine Other (See Comments) 11/08/2017    Family History  Problem Relation Age of Onset  . Diabetes Mother   . Stroke Mother   . Depression Mother   . Colon cancer Father 6       Passed away from colon cancer  . Colon cancer Brother 6       Passed away from colon cancer  . Bipolar disorder Son     Social History   Socioeconomic History  . Marital status: Legally Separated    Spouse name: Not on file  . Number of children: Not on file  . Years of education: Not on file  .  Highest education level: Not on file  Occupational History  . Not on file  Tobacco Use  . Smoking status: Current Every Day Smoker    Packs/day: 1.00    Years: 40.00    Pack years: 40.00    Types: Cigarettes  . Smokeless tobacco: Never Used  . Tobacco comment: one pack a day  Vaping Use  . Vaping Use: Never used  Substance and Sexual Activity  . Alcohol use: No  . Drug use: No  . Sexual activity: Not Currently    Birth control/protection: Surgical  Other Topics Concern  . Not on file  Social History Narrative  . Not on file   Social Determinants of Health   Financial Resource Strain:   . Difficulty of Paying Living Expenses: Not on file  Food Insecurity:   . Worried About Charity fundraiser in the  Last Year: Not on file  . Ran Out of Food in the Last Year: Not on file  Transportation Needs:   . Lack of Transportation (Medical): Not on file  . Lack of Transportation (Non-Medical): Not on file  Physical Activity:   . Days of Exercise per Week: Not on file  . Minutes of Exercise per Session: Not on file  Stress:   . Feeling of Stress : Not on file  Social Connections:   . Frequency of Communication with Friends and Family: Not on file  . Frequency of Social Gatherings with Friends and Family: Not on file  . Attends Religious Services: Not on file  . Active Member of Clubs or Organizations: Not on file  . Attends Archivist Meetings: Not on file  . Marital Status: Not on file    Review of Systems: Gen: Denies fever, chills, anorexia. Denies fatigue, weakness, weight loss.  CV: Denies chest pain, palpitations, syncope, peripheral edema, and claudication. Resp: Denies dyspnea at rest, cough, wheezing, coughing up blood, and pleurisy. GI: Denies vomiting blood, jaundice, and fecal incontinence.   Denies dysphagia or odynophagia. Derm: Denies rash, itching, dry skin Psych: Denies depression, anxiety, memory loss, confusion. No homicidal or suicidal ideation.    Heme: Denies bruising, bleeding, and enlarged lymph nodes.  Physical Exam: There were no vitals taken for this visit. General:   Alert and oriented. No distress noted. Pleasant and cooperative.  Head:  Normocephalic and atraumatic. Eyes:  Conjuctiva clear without scleral icterus. Mouth:  Oral mucosa pink and moist. Good dentition. No lesions. Heart:  S1, S2 present without murmurs appreciated. Lungs:  Clear to auscultation bilaterally. No wheezes, rales, or rhonchi. No distress.  Abdomen:  +BS, soft, non-tender and non-distended. No rebound or guarding. No HSM or masses noted. Msk:  Symmetrical without gross deformities. Normal posture. Extremities:  Without edema. Neurologic:  Alert and  oriented x4 Psych:  Alert and cooperative. Normal mood and affect.

## 2020-08-28 ENCOUNTER — Encounter: Payer: Self-pay | Admitting: Internal Medicine

## 2020-08-28 ENCOUNTER — Ambulatory Visit: Payer: Medicare Other | Admitting: Gastroenterology

## 2020-10-08 ENCOUNTER — Other Ambulatory Visit: Payer: Self-pay

## 2020-10-08 ENCOUNTER — Encounter (HOSPITAL_COMMUNITY): Payer: Self-pay

## 2020-10-08 ENCOUNTER — Emergency Department (HOSPITAL_COMMUNITY)
Admission: EM | Admit: 2020-10-08 | Discharge: 2020-10-08 | Disposition: A | Discharge status: Home / Self Care | Payer: Medicare Other | Attending: Emergency Medicine | Admitting: Emergency Medicine

## 2020-10-08 ENCOUNTER — Emergency Department (HOSPITAL_COMMUNITY): Payer: Medicare Other

## 2020-10-08 DIAGNOSIS — K219 Gastro-esophageal reflux disease without esophagitis: Secondary | ICD-10-CM | POA: Insufficient documentation

## 2020-10-08 DIAGNOSIS — K439 Ventral hernia without obstruction or gangrene: Secondary | ICD-10-CM | POA: Diagnosis not present

## 2020-10-08 DIAGNOSIS — R109 Unspecified abdominal pain: Secondary | ICD-10-CM | POA: Diagnosis present

## 2020-10-08 DIAGNOSIS — Z794 Long term (current) use of insulin: Secondary | ICD-10-CM | POA: Diagnosis not present

## 2020-10-08 DIAGNOSIS — F17210 Nicotine dependence, cigarettes, uncomplicated: Secondary | ICD-10-CM | POA: Insufficient documentation

## 2020-10-08 LAB — COMPREHENSIVE METABOLIC PANEL
ALT: 17 U/L (ref 0–44)
AST: 17 U/L (ref 15–41)
Albumin: 3.6 g/dL (ref 3.5–5.0)
Alkaline Phosphatase: 124 U/L (ref 38–126)
Anion gap: 9 (ref 5–15)
BUN: 14 mg/dL (ref 8–23)
CO2: 22 mmol/L (ref 22–32)
Calcium: 8.5 mg/dL — ABNORMAL LOW (ref 8.9–10.3)
Chloride: 103 mmol/L (ref 98–111)
Creatinine, Ser: 1.03 mg/dL — ABNORMAL HIGH (ref 0.44–1.00)
GFR, Estimated: >60 mL/min (ref >60)
Glucose, Bld: 326 mg/dL — ABNORMAL HIGH (ref 70–99)
Potassium: 4.2 mmol/L (ref 3.5–5.1)
Sodium: 134 mmol/L — ABNORMAL LOW (ref 135–145)
Total Bilirubin: 0.6 mg/dL (ref 0.3–1.2)
Total Protein: 7.3 g/dL (ref 6.5–8.1)

## 2020-10-08 LAB — CBC WITH DIFFERENTIAL/PLATELET
Abs Immature Granulocytes: 0.03 10*3/uL (ref 0.00–0.07)
Basophils Absolute: 0.1 10*3/uL (ref 0.0–0.1)
Basophils Relative: 1 %
Eosinophils Absolute: 0.2 10*3/uL (ref 0.0–0.5)
Eosinophils Relative: 2 %
HCT: 48.4 % — ABNORMAL HIGH (ref 36.0–46.0)
Hemoglobin: 15.9 g/dL — ABNORMAL HIGH (ref 12.0–15.0)
Immature Granulocytes: 0 %
Lymphocytes Relative: 28 %
Lymphs Abs: 3.2 10*3/uL (ref 0.7–4.0)
MCH: 31.8 pg (ref 26.0–34.0)
MCHC: 32.9 g/dL (ref 30.0–36.0)
MCV: 96.8 fL (ref 80.0–100.0)
Monocytes Absolute: 0.6 10*3/uL (ref 0.1–1.0)
Monocytes Relative: 5 %
Neutro Abs: 7.2 10*3/uL (ref 1.7–7.7)
Neutrophils Relative %: 64 %
Platelets: 251 10*3/uL (ref 150–400)
RBC: 5 MIL/uL (ref 3.87–5.11)
RDW: 13.2 % (ref 11.5–15.5)
WBC: 11.2 10*3/uL — ABNORMAL HIGH (ref 4.0–10.5)
nRBC: 0 % (ref 0.0–0.2)

## 2020-10-08 LAB — URINALYSIS, ROUTINE W REFLEX MICROSCOPIC
Bacteria, UA: NONE SEEN
Bilirubin Urine: NEGATIVE
Glucose, UA: >=500 mg/dL — AB
Hgb urine dipstick: NEGATIVE
Ketones, ur: NEGATIVE mg/dL
Leukocytes,Ua: NEGATIVE
Nitrite: NEGATIVE
Protein, ur: NEGATIVE mg/dL
Specific Gravity, Urine: 1.012 (ref 1.005–1.030)
pH: 5 (ref 5.0–8.0)

## 2020-10-08 LAB — LIPASE, BLOOD: Lipase: 52 U/L — ABNORMAL HIGH (ref 11–51)

## 2020-10-08 MED ORDER — HYDROCODONE-ACETAMINOPHEN 5-325 MG PO TABS: 1.0000 | ORAL_TABLET | Freq: Four times a day (QID) | ORAL | 0 refills | Status: DC | PRN

## 2020-10-08 MED ORDER — SODIUM CHLORIDE 0.9 % IV BOLUS
500.0000 mL | Freq: Once | INTRAVENOUS | Status: AC
  Administered 2020-10-08: 500 mL via INTRAVENOUS

## 2020-10-08 MED ORDER — FAMOTIDINE 20 MG PO TABS
20.0000 mg | ORAL_TABLET | Freq: Every day | ORAL | 0 refills | Status: DC
Start: 2020-10-08 — End: 2021-06-26

## 2020-10-08 MED ORDER — IOHEXOL 300 MG/ML  SOLN
100.0000 mL | Freq: Once | INTRAMUSCULAR | Status: AC | PRN
  Administered 2020-10-08: 100 mL via INTRAVENOUS

## 2020-10-08 MED ORDER — MORPHINE SULFATE (PF) 2 MG/ML IV SOLN
2.0000 mg | Freq: Once | INTRAVENOUS | Status: AC
  Administered 2020-10-08: 2 mg via INTRAVENOUS
  Filled 2020-10-08: qty 1

## 2020-10-08 NOTE — ED Notes (Signed)
Patient transported to CT 

## 2020-10-08 NOTE — Discharge Instructions (Addendum)
At this time there does not appear to be the presence of an emergent medical condition, however there is always the potential for conditions to change. Please read and follow the below instructions.  Please return to the Emergency Department immediately for any new or worsening symptoms. Please be sure to follow up with your Primary Care Provider within one week regarding your visit today; please call their office to schedule an appointment even if you are feeling better for a follow-up visit. Please call the general surgeon Dr. Constance Haw under discharge paperwork to schedule follow-up appointment for further evaluation treatment of your hernia. Your CT scan also showed incidental findings including heterogenicity of your right lobe of your liver, adrenal nodules, fat density along the your duodenum, aortic atherosclerosis, fact containing ventral abdominal wall hernia, degenerative changes of your spine and hips. Please discuss these findings with your primary care provider and your general surgeon at follow-up visits. You may take the medication Norco (Hydrocodone/Acetaminophen) as prescribed to help with severe pain.  This medication will make you drowsy so do not drive, drink alcohol, take other sedating medications or perform any dangerous activities such as driving after taking Norco. Norco contains Tylenol (acetaminophen) so do not take any other Tylenol-containing products with Norco. Continue taking your PPI Dexilant as prescribed by your primary provider and gastroenterologist. You may also take the medication Pepcid as prescribed to help with acid reflux related symptoms.  Go to the nearest Emergency Department immediately if: You have fever or chills You have belly pain that gets worse. You feel sick to your stomach (nauseous). You throw up (vomit). Your hernia cannot be pushed in by very gently pressing on it when you are lying down. Do not try to force the bulge back in if it will not push  in easily. Your hernia: Changes in shape or size. Changes color. Feels hard or it hurts when you touch it. You have any new/concerning or worsening of symptoms   Please read the additional information packets attached to your discharge summary.  Do not take your medicine if  develop an itchy rash, swelling in your mouth or lips, or difficulty breathing; call 911 and seek immediate emergency medical attention if this occurs.  You may review your lab tests and imaging results in their entirety on your MyChart account.  Please discuss all results of fully with your primary care provider and other specialist at your follow-up visit.  Note: Portions of this text may have been transcribed using voice recognition software. Every effort was made to ensure accuracy; however, inadvertent computerized transcription errors may still be present.

## 2020-10-08 NOTE — ED Provider Notes (Addendum)
[facility] Provider Note   CSN: [id number] Arrival date & time: 10/08/20  0930     History Chief Complaint  Patient presents with  . Hernia    Katelyn Kelly is a 59 y.o. female history includes obesity, chronic pain, diabetes, GERD, migraines, IBS, hypertension.  Patient reports that she has a hernia that has been causing her pain for the past several years. She reports he has never seen a Education officer, environmental for this problem before. She reports pain has been worse in the past 2 days she describes a sharp aching pain nonradiating worsened when she sits forward and uses her abs and improved with rest, pain is currently mild-moderate. She reports she went to her PCP yesterday and they were concerned for incarcerated hernia and sent her to the ED for evaluation. She reports associated bulge in the top of her abdomen whenever she sits forward and uses her abs.   Denies fever/chills, fall/injury, chest pain/shortness of breath, nausea/vomiting, diarrhea, dysuria/hematuria, cough/abscess or any additional concerns.  HPI     Past Medical History:  Diagnosis Date  . Anxiety and depression   . Bilateral carpal tunnel syndrome   . Chronic back pain   . Chronic neck pain   . Depression   . Diabetes mellitus   . GERD (gastroesophageal reflux disease)   . H/O syncope   . Headache   . Hypertension   . IBS (irritable bowel syndrome)   . Manic depression ([place])   . Migraine   . Panic attack     Patient Active Problem List   Diagnosis Date Noted  . Early satiety 05/28/2020  . Panic disorder 12/11/2019  . Flank pain 12/09/2019  . Diastasis recti 04/11/2019  . Intermittent upper abdominal pain 03/28/2019  . MDD (major depressive disorder), recurrent episode, moderate ([place]) 11/26/2018  . PTSD (post-traumatic stress disorder) 11/26/2018  . Nausea with vomiting 02/14/2018  . GERD (gastroesophageal reflux disease) 11/08/2017  . Dysphagia 11/08/2017  . Taking  multiple medications for chronic disease 08/02/2017  . Family history of colon cancer 08/02/2017  . IBS (irritable bowel syndrome) 08/02/2017  . LATERAL EPICONDYLITIS 08/27/2009  . JOINT PAIN, HAND 07/30/2009  . TRIGGER FINGER 07/30/2009  . CARPAL TUNNEL SYNDROME 03/04/2009  . CERVICAL RADICULITIS 03/04/2009  . NUMBNESS, HAND 02/02/2009    Past Surgical History:  Procedure Laterality Date  . ABDOMINAL HYSTERECTOMY    . ABDOMINAL SURGERY    . APPENDECTOMY    . BIOPSY  09/05/2017   Procedure: BIOPSY;  Surgeon: [name], MD;  [facility]: [facility];  Service: Endoscopy;;  random colon  . BIOPSY  05/01/2018   Procedure: BIOPSY;  Surgeon: [name], MD;  Location: [facility];  Service: Endoscopy;;  gastric biopsy  . CARPAL TUNNEL RELEASE Bilateral   . CHOLECYSTECTOMY    . COLONOSCOPY WITH PROPOFOL N/A 09/05/2017   Procedure: COLONOSCOPY WITH PROPOFOL;  Surgeon: [name], MD; normal TI, 5 small polyps, diverticulosis in the cecum, internal and external hemorrhoids.  Random colon biopsies benign.  Colon polyps were hyperplastic.  Repeat in 2023.  Marland Kitchen DILATION AND CURETTAGE OF UTERUS    . ESOPHAGOGASTRODUODENOSCOPY (EGD) WITH PROPOFOL N/A 05/01/2018   Procedure: ESOPHAGOGASTRODUODENOSCOPY (EGD) WITH PROPOFOL;  Surgeon: [name], MD;  normal esophagus, small hiatal hernia, mild gastritis s/p biopsy, normal duodenum.  No H. pylori.   Marland Kitchen POLYPECTOMY  09/05/2017   Procedure: POLYPECTOMY;  Surgeon: [name], MD;  Location: [facility];  Service: Endoscopy;;  sigmoid and rectal  . TUBAL LIGATION       OB History    Gravida  4   Para  2   Term  2   Preterm      AB  2   Living  2     SAB  2   IAB      Ectopic      Multiple      Live Births              Family History  Problem Relation Age of Onset  . Diabetes Mother   . Stroke Mother   . Depression Mother   . Colon cancer Father 40       Passed away from colon cancer  . Colon cancer  Brother 13       Passed away from colon cancer  . Bipolar disorder Son     Social History   Tobacco Use  . Smoking status: Current Every Day Smoker    Packs/day: 1.00    Years: 40.00    Pack years: 40.00    Types: Cigarettes  . Smokeless tobacco: Never Used  . Tobacco comment: one pack a day  Vaping Use  . Vaping Use: Never used  Substance Use Topics  . Alcohol use: No  . Drug use: No    Home Medications Prior to Admission medications   Medication Sig Start Date End Date Taking? Authorizing Provider  ALPRAZolam Duanne Moron) 0.5 MG tablet Take 1 tablet (0.5 mg total) by mouth 2 (two) times daily as needed for anxiety. 04/10/20  Yes [name], MD  Black Cohosh 40 MG CAPS Take by mouth daily.   Yes [provider]  dexlansoprazole (DEXILANT) 60 MG capsule 1 PO EVERY MORNING WITH BREAKFAST. 05/28/20  Yes Jodi Mourning, Kristen S, PA-C  DULoxetine (CYMBALTA) 60 MG capsule Take 2 capsules (120 mg total) by mouth daily. Patient taking differently: Take 60 mg by mouth daily. Takes 180 mg at night. 03/23/20  Yes Hisada, Elie Goody, MD  insulin lispro (HUMALOG) 100 UNIT/ML KwikPen Inject 2 Units into the skin 3 (three) times daily.  03/09/19  Yes [provider]  ondansetron (ZOFRAN-ODT) 4 MG disintegrating tablet Take 1 tablet (4 mg total) by mouth every 8 (eight) hours as needed for nausea or vomiting. 05/28/20  Yes Erenest Rasher, PA-C  pramipexole (MIRAPEX) 0.125 MG tablet Take 0.125 mg by mouth at bedtime.  05/28/18  Yes [provider]  SUMAtriptan (IMITREX) 50 MG tablet Take 1 tablet by mouth as directed. 05/19/20  Yes [provider]  topiramate (TOPAMAX) 25 MG tablet Take 50 mg by mouth 2 (two) times daily. 05/18/20  Yes [provider]  TRESIBA FLEXTOUCH 100 UNIT/ML SOPN FlexTouch Pen Inject 26 Units into the skin at bedtime. 03/20/19  Yes [provider]  famotidine (PEPCID) 20 MG tablet Take 1 tablet (20 mg total) by mouth daily. 10/08/20    Deliah Boston, PA-C  HYDROcodone-acetaminophen (NORCO/VICODIN) 5-325 MG tablet Take 1 tablet by mouth every 6 (six) hours as needed for severe pain. 10/08/20   Nuala Alpha A, PA-C  lidocaine (LIDODERM) 5 % Place 1 patch onto the skin daily as needed. Apply patch to area most significant pain once per day.  Remove and discard patch within 12 hours of application. Patient not taking: No sig reported 05/17/20   Petrucelli, Samantha R, PA-C  PROAIR HFA 108 (90 Base) MCG/ACT inhaler Inhale 1-2 puffs into the lungs every 6 (  six) hours as needed for wheezing or shortness of breath.  Patient not taking: Reported on 05/28/2020 11/07/18   [provider]    Allergies    Sinequan [doxepin hcl], Albuterol, Haloperidol, Methylphenidate derivatives, Sertraline, Tranxene [clorazepate dipotassium], and Vortioxetine  Review of Systems   Review of Systems Ten systems are reviewed and are negative for acute change except as noted in the HPI   Physical Exam Updated Vital Signs BP 112/67   Pulse 81   Temp 98.1 F (36.7 C)   Resp 16   Ht 5\' 1"  (1.549 m)   Wt 81.6 kg   SpO2 96%   BMI 34.01 kg/m   Physical Exam Constitutional:      General: She is not in acute distress.    Appearance: Normal appearance. She is well-developed. She is obese. She is not ill-appearing or diaphoretic.  HENT:     Head: Normocephalic and atraumatic.  Eyes:     General: Vision grossly intact. Gaze aligned appropriately.     Pupils: Pupils are equal, round, and reactive to light.  Neck:     Trachea: Trachea and phonation normal.  Pulmonary:     Effort: Pulmonary effort is normal. No respiratory distress.  Abdominal:     General: There is no distension.     Palpations: Abdomen is soft.     Tenderness: There is abdominal tenderness in the epigastric area. There is no guarding or rebound.     Comments: No overlying skin changes  Musculoskeletal:        General: Normal range of motion.     Cervical back:  Normal range of motion.  Skin:    General: Skin is warm and dry.  Neurological:     Mental Status: She is alert.     GCS: GCS eye subscore is 4. GCS verbal subscore is 5. GCS motor subscore is 6.     Comments: Speech is clear and goal oriented, follows commands Major Cranial nerves without deficit, no facial droop Moves extremities without ataxia, coordination intact  Psychiatric:        Behavior: Behavior normal.     ED Results / Procedures / Treatments   Labs (all labs ordered are listed, but only abnormal results are displayed) Labs Reviewed  CBC WITH DIFFERENTIAL/PLATELET - Abnormal; Notable for the following components:      Result Value   WBC 11.2 (*)    Hemoglobin 15.9 (*)    HCT 48.4 (*)    All other components within normal limits  COMPREHENSIVE METABOLIC PANEL - Abnormal; Notable for the following components:   Sodium 134 (*)    Glucose, Bld 326 (*)    Creatinine, Ser 1.03 (*)    Calcium 8.5 (*)    All other components within normal limits  LIPASE, BLOOD - Abnormal; Notable for the following components:   Lipase 52 (*)    All other components within normal limits  URINALYSIS, ROUTINE W REFLEX MICROSCOPIC - Abnormal; Notable for the following components:   Glucose, UA >=500 (*)    All other components within normal limits    EKG EKG Interpretation  Date/Time:  Thursday October 08 2020 12:07:04 EST Ventricular Rate:  80 PR Interval:  168 QRS Duration: 68 QT Interval:  390 QTC Calculation: 449 R Axis:   58 Text Interpretation: Normal sinus rhythm Right atrial enlargement Low voltage QRS Septal infarct , age undetermined Abnormal ECG Confirmed by Milton Ferguson [phone]) on 10/08/2020 2:32:33 PM   Radiology CT ABDOMEN PELVIS  W CONTRAST  Result Date: 10/08/2020 CLINICAL DATA:  Epigastric ventral hernia with pain EXAM: CT ABDOMEN AND PELVIS WITH CONTRAST TECHNIQUE: Multidetector CT imaging of the abdomen and pelvis was performed using the standard protocol  following bolus administration of intravenous contrast. CONTRAST:  161mL OMNIPAQUE IOHEXOL 300 MG/ML  SOLN COMPARISON:  Most recent prior 12/13/2019 FINDINGS: Lower chest: No acute abnormality. Hepatobiliary: Mild heterogeneity particularly in the right hepatic lobe as seen previously and likely reflecting steatosis. Cholecystectomy. No biliary dilatation. Pancreas: Unremarkable. Spleen: Unremarkable. Adrenals/Urinary Tract: Right larger than left adrenal nodules are again identified and likely reflect adenomas. Kidneys are unremarkable. Bladder is poorly distended with likely related wall thickening. Stomach/Bowel: Stomach is within normal limits. There is a small area of fat density along the horizontal portion of the duodenum. This was present on prior studies and likely reflects a small lipoma. Bowel is normal in caliber. Vascular/Lymphatic: Aortic atherosclerosis. No enlarged lymph nodes identified. Reproductive: Status post hysterectomy. No adnexal masses. Other: Unchanged appearance of small fat containing ventral upper abdominal wall hernia (series 2, image 18. No infiltration of the herniated fat. No new hernia. No ascites. Musculoskeletal: Degenerative changes of the lower lumbar spine. Degenerative changes of the hips. No acute osseous abnormality. IMPRESSION: Stable small fat containing ventral upper abdominal wall hernia without evidence of inflammatory changes. Additional stable chronic findings detailed above. Electronically Signed   By: Macy Mis M.D.   On: 10/08/2020 13:58    Procedures Procedures (including critical care time)  Medications Ordered in ED Medications  morphine 2 MG/ML injection 2 mg (2 mg Intravenous Given 10/08/20 1223)  sodium chloride 0.9 % bolus 500 mL (0 mLs Intravenous Stopped 10/08/20 1437)  iohexol (OMNIPAQUE) 300 MG/ML solution 100 mL (100 mLs Intravenous Contrast Given 10/08/20 1319)    ED Course  I have reviewed the triage vital signs and the nursing  notes.  Pertinent labs & imaging results that were available during my care of the patient were reviewed by me and considered in my medical decision making (see chart for details).  Clinical Course as of 10/08/20 1512  Thu Oct 08, 2020  1431 Dr. Wynonia Sours [BM]    Clinical Course User Index [BM] Gari Crown   MDM Rules/Calculators/A&P                         Additional history obtained from: 1. Nursing notes from this visit. 2. Review of electronic medical records.  Patient saw her PCP yesterday, according their assessment and plan patient had epigastric pain.  They were concerned that this may be due to epigastric ventral hernia concern for strangulation and sent patient to the emergency department for possible imaging.  They prescribed her Zofran. - I ordered, reviewed and interpreted labs which include: CBC shows nonspecific leukocytosis of 11.2 and mild elevated hemoglobin at 15.9. Possible some hemoconcentration due to dehydration, no infectious symptoms currently. CMP shows no emergent electrolyte derangement, LFT elevations or gap. Mild elevation of creatinine at 1.03 similar to prior. Lipase minimally above normal at 52, improved from prior; doubt pancreatitis. Urinalysis without evidence of infection, shows glucose otherwise within normal limits. EKG reviewed with Dr. Roderic Palau, shows no acute findings.  CTAP:  IMPRESSION:  Stable small fat containing ventral upper abdominal wall hernia  without evidence of inflammatory changes.    Additional stable chronic findings detailed above.  - 2:31 PM: Consulted general surgeon Dr. Arnoldo Morale, advised patient can follow-up outpatient. Dr. Arnoldo Morale advised that he  is out next week and that Dr. Constance Haw' contact information should be given. - Suspect patient's symptoms today are secondary to pain associated with her ventral hernia there is no emergent findings or indications for immediate surgical intervention. Patient will be  discharged with pain medication and home therapies. Her pain is primarily only when she sits up and uses her abscess outpatient advised to avoid that movement. No chest pain shortness of breath and vital signs are stable, no evidence for ACS, PE, dissection or other emergent cardiopulmonary etiologies of epigastric pain. Additionally patient without right upper quadrant tenderness or LFT elevations to suggest gallbladder etiology or other emergent intra-abdominal pathologies. There is no indication for further work-up or evaluation. PMD P reviewed patient receives regular prescriptions for alprazolam, patient advised not to take Norco with alprazolam, 4 pills Norco prescribed for pain associated with hernia.  At this time there does not appear to be any evidence of an acute emergency medical condition and the patient appears stable for discharge with appropriate outpatient follow up. Diagnosis was discussed with patient who verbalizes understanding of care plan and is agreeable to discharge. I have discussed return precautions with patient who verbalizes understanding. Patient encouraged to follow-up with their PCP and General Surgery. All questions answered.  Patient's case discussed with Dr. Roderic Palau who agrees with plan to discharge with outpatient General Surgery follow-up.   Note: Portions of this report may have been transcribed using voice recognition software. Every effort was made to ensure accuracy; however, inadvertent computerized transcription errors may still be present. Final Clinical Impression(s) / ED Diagnoses Final diagnoses:  Ventral hernia without obstruction or gangrene    Rx / DC Orders ED Discharge Orders         Ordered    HYDROcodone-acetaminophen (NORCO/VICODIN) 5-325 MG tablet  Every 6 hours PRN        10/08/20 1512    famotidine (PEPCID) 20 MG tablet  Daily        10/08/20 1512           Deliah Boston, PA-C 10/08/20 1513    Gari Crown 10/08/20 1522    Milton Ferguson, MD 10/09/20 1024

## 2020-10-08 NOTE — ED Triage Notes (Signed)
Epigastric ventral hernia that was evaluated by PCP yesterday, was advised to come to ED due to continuing pain. States n no v/d. Is able to eat/drink without difficulty.

## 2020-10-26 ENCOUNTER — Emergency Department (HOSPITAL_COMMUNITY)
Admission: EM | Admit: 2020-10-26 | Discharge: 2020-10-26 | Disposition: A | Discharge status: Home / Self Care | Payer: Medicare Other | Attending: Emergency Medicine | Admitting: Emergency Medicine

## 2020-10-26 ENCOUNTER — Encounter (HOSPITAL_COMMUNITY): Payer: Self-pay | Admitting: Emergency Medicine

## 2020-10-26 ENCOUNTER — Other Ambulatory Visit: Payer: Self-pay

## 2020-10-26 DIAGNOSIS — I10 Essential (primary) hypertension: Secondary | ICD-10-CM | POA: Diagnosis not present

## 2020-10-26 DIAGNOSIS — Z794 Long term (current) use of insulin: Secondary | ICD-10-CM | POA: Insufficient documentation

## 2020-10-26 DIAGNOSIS — E119 Type 2 diabetes mellitus without complications: Secondary | ICD-10-CM | POA: Diagnosis not present

## 2020-10-26 DIAGNOSIS — R519 Headache, unspecified: Secondary | ICD-10-CM | POA: Insufficient documentation

## 2020-10-26 DIAGNOSIS — F17210 Nicotine dependence, cigarettes, uncomplicated: Secondary | ICD-10-CM | POA: Insufficient documentation

## 2020-10-26 MED ORDER — ACETAMINOPHEN 325 MG PO TABS
650.0000 mg | ORAL_TABLET | Freq: Once | ORAL | Status: AC
  Administered 2020-10-26: 650 mg via ORAL
  Filled 2020-10-26: qty 2

## 2020-10-26 MED ORDER — KETOROLAC TROMETHAMINE 10 MG PO TABS: 10.0000 mg | ORAL_TABLET | Freq: Four times a day (QID) | ORAL | 0 refills | Status: DC | PRN

## 2020-10-26 MED ORDER — SODIUM CHLORIDE 0.9 % IV BOLUS
1000.0000 mL | Freq: Once | INTRAVENOUS | Status: AC
  Administered 2020-10-26: 1000 mL via INTRAVENOUS

## 2020-10-26 MED ORDER — LIDOCAINE 5 % EX PTCH
1.0000 | MEDICATED_PATCH | Freq: Once | CUTANEOUS | Status: DC
  Administered 2020-10-26: 15:00:00 1 via TRANSDERMAL
  Filled 2020-10-26: qty 1

## 2020-10-26 MED ORDER — DIPHENHYDRAMINE HCL 50 MG/ML IJ SOLN
25.0000 mg | Freq: Once | INTRAMUSCULAR | Status: AC
  Administered 2020-10-26: 25 mg via INTRAVENOUS
  Filled 2020-10-26: qty 1

## 2020-10-26 MED ORDER — KETOROLAC TROMETHAMINE 30 MG/ML IJ SOLN
15.0000 mg | Freq: Once | INTRAMUSCULAR | Status: AC
  Administered 2020-10-26: 15 mg via INTRAVENOUS
  Filled 2020-10-26: qty 1

## 2020-10-26 MED ORDER — PROCHLORPERAZINE EDISYLATE 10 MG/2ML IJ SOLN
10.0000 mg | Freq: Once | INTRAMUSCULAR | Status: AC
  Administered 2020-10-26: 10 mg via INTRAVENOUS
  Filled 2020-10-26: qty 2

## 2020-10-26 NOTE — ED Provider Notes (Signed)
[facility] EMERGENCY DEPARTMENT Provider Note   CSN: [id number] Arrival date & time: 10/26/20  1200     History Chief Complaint  Patient presents with  . Migraine    Katelyn Kelly is a 59 y.o. female with past medical history significant for chronic back and neck pain, diabetes, GERD, migraine, hypertension, IBS, panic disorder.  HPI Presents to emergency department today with chief complaint of migraine x2 days.  She called her PCP who recommended she come to the emergency room for evaluation.  Patient states the pain is located in the left side of her head and radiates to her neck.  The pain is constant and has progressively worsened since onset.  Pain is 10 out of 10 in severity.  She states bright lights make the pain worse. Patient tried home medications without symptom improvement.  She states this feels like her typical migraines.   She denies any fall or head injury. Denies fever, syncope, head trauma, phonophobia, UL throbbing, N/V, visual changes, stiff neck, rash, or "thunderclap" onset. Patient also endorses increased stress lately as her mother passed away during the holidays making this time of year stressful. Denies any SI or HI.      Past Medical History:  Diagnosis Date  . Anxiety and depression   . Bilateral carpal tunnel syndrome   . Chronic back pain   . Chronic neck pain   . Depression   . Diabetes mellitus   . GERD (gastroesophageal reflux disease)   . H/O syncope   . Headache   . Hypertension   . IBS (irritable bowel syndrome)   . Manic depression ([place])   . Migraine   . Panic attack     Patient Active Problem List   Diagnosis Date Noted  . Early satiety 05/28/2020  . Panic disorder 12/11/2019  . Flank pain 12/09/2019  . Diastasis recti 04/11/2019  . Intermittent upper abdominal pain 03/28/2019  . MDD (major depressive disorder), recurrent episode, moderate ([place]) 11/26/2018  . PTSD (post-traumatic stress disorder) 11/26/2018  . Nausea with  vomiting 02/14/2018  . GERD (gastroesophageal reflux disease) 11/08/2017  . Dysphagia 11/08/2017  . Taking multiple medications for chronic disease 08/02/2017  . Family history of colon cancer 08/02/2017  . IBS (irritable bowel syndrome) 08/02/2017  . LATERAL EPICONDYLITIS 08/27/2009  . JOINT PAIN, HAND 07/30/2009  . TRIGGER FINGER 07/30/2009  . CARPAL TUNNEL SYNDROME 03/04/2009  . CERVICAL RADICULITIS 03/04/2009  . NUMBNESS, HAND 02/02/2009    Past Surgical History:  Procedure Laterality Date  . ABDOMINAL HYSTERECTOMY    . ABDOMINAL SURGERY    . APPENDECTOMY    . BIOPSY  09/05/2017   Procedure: BIOPSY;  Surgeon: [name], MD;  [facility];  Service: Endoscopy;;  random colon  . BIOPSY  05/01/2018   Procedure: BIOPSY;  Surgeon: [name], MD;  [facility];  Service: Endoscopy;;  gastric biopsy  . CARPAL TUNNEL RELEASE Bilateral   . CHOLECYSTECTOMY    . COLONOSCOPY WITH PROPOFOL N/A 09/05/2017   Procedure: COLONOSCOPY WITH PROPOFOL;  Surgeon: [name], MD; normal TI, 5 small polyps, diverticulosis in the cecum, internal and external hemorrhoids.  Random colon biopsies benign.  Colon polyps were hyperplastic.  Repeat in 2023.  Marland Kitchen DILATION AND CURETTAGE OF UTERUS    . ESOPHAGOGASTRODUODENOSCOPY (EGD) WITH PROPOFOL N/A 05/01/2018   Procedure: ESOPHAGOGASTRODUODENOSCOPY (EGD) WITH PROPOFOL;  Surgeon: [name], MD;  normal esophagus, small hiatal hernia, mild gastritis s/p biopsy, normal  duodenum.  No H. pylori.   Marland Kitchen POLYPECTOMY  09/05/2017   Procedure: POLYPECTOMY;  Surgeon: West Bali, MD;  [facility];  Service: Endoscopy;;  sigmoid and rectal  . TUBAL LIGATION       OB History    Gravida  4   Para  2   Term  2   Preterm      AB  2   Living  2     SAB  2   IAB      Ectopic      Multiple      Live Births              Family History  Problem Relation Age of Onset  . Diabetes Mother   . Stroke  Mother   . Depression Mother   . Colon cancer Father 48       Passed away from colon cancer  . Colon cancer Brother 48       Passed away from colon cancer  . Bipolar disorder Son     Social History   Tobacco Use  . Smoking status: Current Every Day Smoker    Packs/day: 1.00    Years: 40.00    Pack years: 40.00    Types: Cigarettes  . Smokeless tobacco: Never Used  . Tobacco comment: one pack a day  Vaping Use  . Vaping Use: Never used  Substance Use Topics  . Alcohol use: No  . Drug use: No    Home Medications Prior to Admission medications   Medication Sig Start Date End Date Taking? Authorizing Provider  ketorolac (TORADOL) 10 MG tablet Take 1 tablet (10 mg total) by mouth every 6 (six) hours as needed. 10/26/20  Yes Walisiewicz, Kaitlyn E, PA-C  ALPRAZolam (XANAX) 0.5 MG tablet Take 1 tablet (0.5 mg total) by mouth 2 (two) times daily as needed for anxiety. 04/10/20   Neysa Hotter, MD  Black Cohosh 40 MG CAPS Take by mouth daily.    [provider]  dexlansoprazole (DEXILANT) 60 MG capsule 1 PO EVERY MORNING WITH BREAKFAST. 05/28/20   Letta Median, PA-C  DULoxetine (CYMBALTA) 60 MG capsule Take 2 capsules (120 mg total) by mouth daily. Patient taking differently: Take 60 mg by mouth daily. Takes 180 mg at night. 03/23/20   Neysa Hotter, MD  famotidine (PEPCID) 20 MG tablet Take 1 tablet (20 mg total) by mouth daily. 10/08/20   Bill Salinas, PA-C  HYDROcodone-acetaminophen (NORCO/VICODIN) 5-325 MG tablet Take 1 tablet by mouth every 6 (six) hours as needed for severe pain. 10/08/20   Harlene Salts A, PA-C  insulin lispro (HUMALOG) 100 UNIT/ML KwikPen Inject 2 Units into the skin 3 (three) times daily.  03/09/19   [provider]  lidocaine (LIDODERM) 5 % Place 1 patch onto the skin daily as needed. Apply patch to area most significant pain once per day.  Remove and discard patch within 12 hours of application. Patient not taking: No sig reported  05/17/20   Petrucelli, Samantha R, PA-C  ondansetron (ZOFRAN-ODT) 4 MG disintegrating tablet Take 1 tablet (4 mg total) by mouth every 8 (eight) hours as needed for nausea or vomiting. 05/28/20   Letta Median, PA-C  pramipexole (MIRAPEX) 0.125 MG tablet Take 0.125 mg by mouth at bedtime.  05/28/18   [provider]  PROAIR HFA 108 (90 Base) MCG/ACT inhaler Inhale 1-2 puffs into the lungs every 6 (six) hours as needed for wheezing or shortness  of breath.  Patient not taking: Reported on 05/28/2020 11/07/18   [provider]  SUMAtriptan (IMITREX) 50 MG tablet Take 1 tablet by mouth as directed. 05/19/20   [provider]  topiramate (TOPAMAX) 25 MG tablet Take 50 mg by mouth 2 (two) times daily. 05/18/20   [provider]  TRESIBA FLEXTOUCH 100 UNIT/ML SOPN FlexTouch Pen Inject 26 Units into the skin at bedtime. 03/20/19   [provider]    Allergies    Sinequan [doxepin hcl], Albuterol, Haloperidol, Methylphenidate derivatives, Sertraline, Tranxene [clorazepate dipotassium], and Vortioxetine  Review of Systems   Review of Systems All other systems are reviewed and are negative for acute change except as noted in the HPI.  Physical Exam Updated Vital Signs BP (!) 152/89 (BP Location: Right Arm)   Pulse 98   Temp 97.8 F (36.6 C) (Oral)   Resp 18   Ht 5\' 1"  (1.549 m)   Wt 81.6 kg   SpO2 97%   BMI 34.01 kg/m   Physical Exam Vitals and nursing note reviewed.  Constitutional:      General: She is not in acute distress.    Appearance: She is not ill-appearing.  HENT:     Head: Normocephalic and atraumatic.     Comments: No sinus or temporal tenderness.    Right Ear: Tympanic membrane and external ear normal.     Left Ear: Tympanic membrane and external ear normal.     Nose: Nose normal.     Mouth/Throat:     Mouth: Mucous membranes are moist.     Pharynx: Oropharynx is clear.  Eyes:     General: No scleral icterus.       Right eye: No  discharge.        Left eye: No discharge.     Extraocular Movements: Extraocular movements intact.     Conjunctiva/sclera: Conjunctivae normal.     Pupils: Pupils are equal, round, and reactive to light.  Neck:     Vascular: No JVD.     Comments: Tender to palpation of left trapezius/paraspinal muscles.  No midline tenderness.  No step-off.  No meningeal signs. Cardiovascular:     Rate and Rhythm: Normal rate and regular rhythm.     Pulses: Normal pulses.          Radial pulses are 2+ on the right side and 2+ on the left side.     Heart sounds: Normal heart sounds.  Pulmonary:     Comments: Lungs clear to auscultation in all fields. Symmetric chest rise. No wheezing, rales, or rhonchi. Abdominal:     Comments: Abdomen is soft, non-distended, and non-tender in all quadrants. No rigidity, no guarding. No peritoneal signs.  Musculoskeletal:        General: Normal range of motion.     Cervical back: Normal range of motion.  Skin:    General: Skin is warm and dry.     Capillary Refill: Capillary refill takes less than 2 seconds.  Neurological:     Mental Status: She is oriented to person, place, and time.     GCS: GCS eye subscore is 4. GCS verbal subscore is 5. GCS motor subscore is 6.     Comments: Mental Status:  Alert, oriented, thought content appropriate, able to give a coherent history. Speech fluent without evidence of aphasia. Able to follow 2 step commands without difficulty.  Cranial Nerves:  II:  Peripheral visual fields grossly normal, pupils equal, round, reactive to light III,IV,  VI: ptosis not present, extra-ocular motions intact bilaterally  V,VII: smile symmetric, facial light touch sensation equal VIII: hearing grossly normal to voice  X: uvula elevates symmetrically  XI: bilateral shoulder shrug symmetric and strong XII: midline tongue extension without fassiculations Motor:  Normal tone. 5/5 in upper and lower extremities bilaterally including strong and equal  grip strength and dorsiflexion/plantar flexion Sensory: Pinprick and light touch normal in all extremities.  Deep Tendon Reflexes: 2+ and symmetric in the biceps and patella Cerebellar: normal finger-to-nose with bilateral upper extremities Gait: normal gait and balance CV: distal pulses palpable throughout     Psychiatric:        Mood and Affect: Affect is tearful.        Behavior: Behavior normal.     ED Results / Procedures / Treatments   Labs (all labs ordered are listed, but only abnormal results are displayed) Labs Reviewed - No data to display  EKG None  Radiology No results found.  Procedures Procedures (including critical care time)  Medications Ordered in ED Medications  lidocaine (LIDODERM) 5 % 1 patch (1 patch Transdermal Patch Applied 10/26/20 1521)  diphenhydrAMINE (BENADRYL) injection 25 mg (25 mg Intravenous Given 10/26/20 1416)  prochlorperazine (COMPAZINE) injection 10 mg (10 mg Intravenous Given 10/26/20 1417)  sodium chloride 0.9 % bolus 1,000 mL (1,000 mLs Intravenous New Bag/Given 10/26/20 1418)  ketorolac (TORADOL) 30 MG/ML injection 15 mg (15 mg Intravenous Given 10/26/20 1458)  acetaminophen (TYLENOL) tablet 650 mg (650 mg Oral Given 10/26/20 1458)    ED Course  I have reviewed the triage vital signs and the nursing notes.  Pertinent labs & imaging results that were available during my care of the patient were reviewed by me and considered in my medical decision making (see chart for details).    MDM Rules/Calculators/A&P                          History provided by patient with additional history obtained from chart review.    Presentation is like pts typical HA and non concerning for [facility], ICH, Meningitis, or temporal arteritis. Pt is afebrile with no focal neuro deficits, nuchal rigidity, or change in vision. Given 1L NS, Compazine, Benadryl, Toradol, and tylenol given for headache.  On reassessment headache has improved. She feels she can  manage symptoms at home. She is going to follow up with pcp regarding headaches and home medications not working. Will discharge with short course of PO Toradol as that is what helped most with her pain today. Patient knows not to take with NSAIDs or alcohol. Recent labs with Creatinine 1.03, no AKI. Pt verbalizes understanding and is agreeable with plan to dc.   The patient appears reasonably screened and/or stabilized for discharge and I doubt any other medical condition or other [facility] requiring further screening, evaluation, or treatment in the ED at this time prior to discharge. The patient is safe for discharge with strict return precautions discussed.   Portions of this note were generated with [employer]. Dictation errors may occur despite best attempts at proofreading.  Final Clinical Impression(s) / ED Diagnoses Final diagnoses:  Bad headache    Rx / DC Orders ED Discharge Orders         Ordered    ketorolac (TORADOL) 10 MG tablet  Every 6 hours PRN        10/26/20 1533           Walisiewicz, Harley Hallmark,  PA-C 10/26/20 1552    Vanetta Mulders, MD 11/07/20 2357

## 2020-10-26 NOTE — Discharge Instructions (Signed)
-  Prescription sent to your pharmacy for Toradol. This is an anti-inflammatory medication.  Do not take it with any additional NSAIDs including Aleve, ibuprofen, Motrin, Goody powders as these medicines are all in the same family.  Only take this for severe pain.  Continue other home medications.  It is important that you follow-up with your primary care doctor to further discuss your migraines and possible changes in your medications.  Return to the emergency room if you have any new or worsening symptoms.  Thank you for allowing [place] to care for you today.

## 2020-10-26 NOTE — ED Triage Notes (Signed)
Pt c/o of a migraine x1 day. PCP advised pt to be seen at ED.

## 2020-10-27 ENCOUNTER — Encounter: Payer: Self-pay | Admitting: General Surgery

## 2020-10-27 ENCOUNTER — Ambulatory Visit (INDEPENDENT_AMBULATORY_CARE_PROVIDER_SITE_OTHER): Payer: Medicare Other | Admitting: General Surgery

## 2020-10-27 VITALS — BP 138/66 | HR 96 | Temp 98.2°F | Resp 16 | Ht 61.5 in | Wt 184.0 lb

## 2020-10-27 DIAGNOSIS — M6208 Separation of muscle (nontraumatic), other site: Secondary | ICD-10-CM | POA: Diagnosis not present

## 2020-10-27 DIAGNOSIS — K432 Incisional hernia without obstruction or gangrene: Secondary | ICD-10-CM | POA: Insufficient documentation

## 2020-10-27 NOTE — Progress Notes (Signed)
[facility] History and Physical  Reason for Referral: Ventral hernia  Referring Physician:  Gwenlyn Found, MD   Chief Complaint    New Patient (Initial Visit)      Katelyn Kelly is a 59 y.o. female.  HPI: Katelyn Kelly is a 59 yo with history of anxiety, IBS, migraine, IBS, DM who come in with upper abdominal pain that is associated with a mass that she feels at times. She was seen in the ED yesterday for a migraine and had been seen 2 weeks prior for the upper abdominal pain. Work up at that time demonstrated a small ventral hernia in the epigastric region with fat.  She had said she had been to her PCP and had been nervous about it being incarcerated. She says that she is very anxious about having surgery.  She has had some associated nausea but no vomiting.   Past Medical History:  Diagnosis Date   Anxiety and depression    Bilateral carpal tunnel syndrome    Chronic back pain    Chronic neck pain    Depression    Diabetes mellitus    GERD (gastroesophageal reflux disease)    H/O syncope    Headache    Hypertension    IBS (irritable bowel syndrome)    Manic depression ([facility])    Migraine    Panic attack     Past Surgical History:  Procedure Laterality Date   ABDOMINAL HYSTERECTOMY     ABDOMINAL SURGERY     APPENDECTOMY     BIOPSY  09/05/2017   Procedure: BIOPSY;  Surgeon: West Bali, MD;  [facility];  Service: Endoscopy;;  random colon   BIOPSY  05/01/2018   Procedure: BIOPSY;  Surgeon: West Bali, MD;  [facility];  Service: Endoscopy;;  gastric biopsy   CARPAL TUNNEL RELEASE Bilateral    CHOLECYSTECTOMY     COLONOSCOPY WITH PROPOFOL N/A 09/05/2017   Procedure: COLONOSCOPY WITH PROPOFOL;  Surgeon: West Bali, MD; normal TI, 5 small polyps, diverticulosis in the cecum, internal and external hemorrhoids.  Random colon biopsies benign.  Colon polyps were hyperplastic.  Repeat in 2023.    DILATION AND CURETTAGE OF UTERUS     ESOPHAGOGASTRODUODENOSCOPY (EGD) WITH PROPOFOL N/A 05/01/2018   Procedure: ESOPHAGOGASTRODUODENOSCOPY (EGD) WITH PROPOFOL;  Surgeon: West Bali, MD;  normal esophagus, small hiatal hernia, mild gastritis s/p biopsy, normal duodenum.  No H. pylori.    POLYPECTOMY  09/05/2017   Procedure: POLYPECTOMY;  Surgeon: West Bali, MD;  [facility];  Service: Endoscopy;;  sigmoid and rectal   TUBAL LIGATION      Family History  Problem Relation Age of Onset   Diabetes Mother    Stroke Mother    Depression Mother    Colon cancer Father 8       Passed away from colon cancer   Colon cancer Brother 71       Passed away from colon cancer   Bipolar disorder Son     Social History   Tobacco Use   Smoking status: Current Every Day Smoker    Packs/day: 1.00    Years: 40.00    Pack years: 40.00    Types: Cigarettes   Smokeless tobacco: Never Used   Tobacco comment: one pack a day  Vaping Use   Vaping Use: Never used  Substance Use Topics   Alcohol use: No   Drug use: No  Medications: I have reviewed the patient's current medications. Allergies as of 10/27/2020      Reactions   Sinequan [doxepin Hcl] Anaphylaxis   Albuterol Other (See Comments)   Patient does not remember the reaction. This was a record provided via [facility]   Haloperidol Other (See Comments)   Patient does not remember the reaction. This was a record provided via [facility]   Methylphenidate Derivatives Hives   Sertraline Other (See Comments)   Patient does not remember the reaction. This was a record provided via [facility]   Tranxene [clorazepate Dipotassium] Hives, Swelling   Vortioxetine Other (See Comments)   Patient does not remember the reaction. This was a record provided via [facility]      Medication List       Accurate as of October 27, 2020 10:06 AM. If you have any questions, ask your nurse or doctor.         ALPRAZolam 0.5 MG tablet Commonly known as: Xanax Take 1 tablet (0.5 mg total) by mouth 2 (two) times daily as needed for anxiety.   Black Cohosh 40 MG Caps Take by mouth daily.   Dexilant 60 MG capsule Generic drug: dexlansoprazole 1 PO EVERY MORNING WITH BREAKFAST.   DULoxetine 60 MG capsule Commonly known as: CYMBALTA Take 2 capsules (120 mg total) by mouth daily. What changed:   how much to take  additional instructions   famotidine 20 MG tablet Commonly known as: PEPCID Take 1 tablet (20 mg total) by mouth daily.   HYDROcodone-acetaminophen 5-325 MG tablet Commonly known as: NORCO/VICODIN Take 1 tablet by mouth every 6 (six) hours as needed for severe pain.   insulin lispro 100 UNIT/ML KwikPen Commonly known as: HUMALOG Inject 2 Units into the skin 3 (three) times daily.   ketorolac 10 MG tablet Commonly known as: TORADOL Take 1 tablet (10 mg total) by mouth every 6 (six) hours as needed.   lidocaine 5 % Commonly known as: Lidoderm Place 1 patch onto the skin daily as needed. Apply patch to area most significant pain once per day.  Remove and discard patch within 12 hours of application.   ondansetron 4 MG disintegrating tablet Commonly known as: ZOFRAN-ODT Take 1 tablet (4 mg total) by mouth every 8 (eight) hours as needed for nausea or vomiting.   pramipexole 0.125 MG tablet Commonly known as: MIRAPEX Take 0.125 mg by mouth at bedtime.   ProAir HFA 108 (90 Base) MCG/ACT inhaler Generic drug: albuterol Inhale 1-2 puffs into the lungs every 6 (six) hours as needed for wheezing or shortness of breath.   SUMAtriptan 50 MG tablet Commonly known as: IMITREX Take 1 tablet by mouth as directed.   topiramate 25 MG tablet Commonly known as: TOPAMAX Take 50 mg by mouth 2 (two) times daily.   Tyler Aas FlexTouch 100 UNIT/ML FlexTouch Pen Generic drug: insulin degludec Inject 26 Units into the skin at bedtime.        ROS:  A comprehensive review  of systems was negative except for: Respiratory: positive for wheezing Gastrointestinal: positive for abdominal pain and nausea Musculoskeletal: positive for back pain and neck pain Neurological: positive for anxiety Endocrine: positive for diabetes  Blood pressure 138/66, pulse 96, temperature 98.2 F (36.8 C), temperature source Oral, resp. rate 16, height 5' 1.5" (1.562 m), weight 184 lb (83.5 kg), SpO2 95 %. Physical Exam Vitals reviewed.  Constitutional:      Appearance: Normal appearance.  HENT:     Head: Normocephalic.  Nose: Nose normal.     Mouth/Throat:     Mouth: Mucous membranes are moist.  Eyes:     Extraocular Movements: Extraocular movements intact.  Cardiovascular:     Rate and Rhythm: Normal rate and regular rhythm.  Pulmonary:     Effort: Pulmonary effort is normal.     Breath sounds: Normal breath sounds.  Abdominal:     General: There is no distension.     Palpations: Abdomen is soft.     Tenderness: There is abdominal tenderness.     Hernia: A hernia is present.     Comments: Ventral hernia in the epigastric region, nonreducible, tender, well healed RUQ scar ending medially at the hernia site  Musculoskeletal:        General: Normal range of motion.     Cervical back: Normal range of motion.  Skin:    General: Skin is warm.  Neurological:     General: No focal deficit present.     Mental Status: She is alert and oriented to person, place, and time.  Psychiatric:        Behavior: Behavior normal.     Comments: anxious     Results: Personally reviewed- small 1 cm incisional hernia at about midline, fat containing, protected by the liver inferior   CLINICAL DATA:  Epigastric ventral hernia with pain  EXAM: CT ABDOMEN AND PELVIS WITH CONTRAST  TECHNIQUE: Multidetector CT imaging of the abdomen and pelvis was performed using the standard protocol following bolus administration of intravenous contrast.  CONTRAST:  169mL OMNIPAQUE IOHEXOL 300  MG/ML  SOLN  COMPARISON:  Most recent prior 12/13/2019  FINDINGS: Lower chest: No acute abnormality.  Hepatobiliary: Mild heterogeneity particularly in the right hepatic lobe as seen previously and likely reflecting steatosis. Cholecystectomy. No biliary dilatation.  Pancreas: Unremarkable.  Spleen: Unremarkable.  Adrenals/Urinary Tract: Right larger than left adrenal nodules are again identified and likely reflect adenomas. Kidneys are unremarkable. Bladder is poorly distended with likely related wall thickening.  Stomach/Bowel: Stomach is within normal limits. There is a small area of fat density along the horizontal portion of the duodenum. This was present on prior studies and likely reflects a small lipoma. Bowel is normal in caliber.  Vascular/Lymphatic: Aortic atherosclerosis. No enlarged lymph nodes identified.  Reproductive: Status post hysterectomy. No adnexal masses.  Other: Unchanged appearance of small fat containing ventral upper abdominal wall hernia (series 2, image 18. No infiltration of the herniated fat. No new hernia. No ascites.  Musculoskeletal: Degenerative changes of the lower lumbar spine. Degenerative changes of the hips. No acute osseous abnormality.  IMPRESSION: Stable small fat containing ventral upper abdominal wall hernia without evidence of inflammatory changes.  Additional stable chronic findings detailed above.   Electronically Signed   By: Macy Mis M.D.   On: 10/08/2020 13:58  Assessment & Plan:  CHEYANA BATTLES is a 59 y.o. female with a small incisional hernia from her prior RUQ incision from her cholecystectomy. There is incarcerated fat but no bowel and would unlikely have bowel in it ever due to the liver being bowel.  Discussed with patient risk of repair and risk of bleeding, infection, use of mesh, and risk of recurrence. Discussed that she does not have to get it repaired if not having discomfort. She  reports that she has had problems with the area for 2 years and wants to get it repaired. She is very anxious.   -Discussed preop COVID testing  -Open hernia repair with mesh  -  Discussed lifting limitations in the next 6-8 weeks post op   All questions were answered to the satisfaction of the patient.    Virl Cagey 10/27/2020, 10:06 AM

## 2020-10-27 NOTE — Patient Instructions (Signed)
Ventral Hernia  A ventral hernia is a bulge of tissue from inside the abdomen that pushes through a weak area of the muscles that form the front wall of the abdomen. The tissues inside the abdomen are inside a sac (peritoneum). These tissues include the small intestine, large intestine, and the fatty tissue that covers the intestines (omentum). Sometimes, the bulge that forms a hernia contains intestines. Other hernias contain only fat. Ventral hernias do not go away without surgical treatment. There are several types of ventral hernias. You may have:  A hernia at an incision site from previous abdominal surgery (incisional hernia).  A hernia just above the belly button (epigastric hernia), or at the belly button (umbilical hernia). These types of hernias can develop from heavy lifting or straining.  A hernia that comes and goes (reducible hernia). It may be visible only when you lift or strain. This type of hernia can be pushed back into the abdomen (reduced).  A hernia that traps abdominal tissue inside the hernia (incarcerated hernia). This type of hernia does not reduce.  A hernia that cuts off blood flow to the tissues inside the hernia (strangulated hernia). The tissues can start to die if this happens. This is a very painful bulge that cannot be reduced. A strangulated hernia is a medical emergency. What are the causes? This condition is caused by abdominal tissue putting pressure on an area of weakness in the abdominal muscles. What increases the risk? The following factors may make you more likely to develop this condition:  Being female.  Being 60 or older.  Being overweight or obese.  Having had previous abdominal surgery, especially if there was an infection after surgery.  Having had an injury to the abdominal wall.  Having had several pregnancies.  Having a buildup of fluid inside the abdomen (ascites). What are the signs or symptoms? The only symptom of a ventral hernia  may be a painless bulge in the abdomen. A reducible hernia may be visible only when you strain, cough, or lift. Other symptoms may include:  Dull pain.  A feeling of pressure. Signs and symptoms of a strangulated hernia may include:  Increasing pain.  Nausea and vomiting.  Pain when pressing on the hernia.  The skin over the hernia turning red or purple.  Constipation.  Blood in the stool (feces). How is this diagnosed? This condition may be diagnosed based on:  Your symptoms.  Your medical history.  A physical exam. You may be asked to cough or strain while standing. These actions increase the pressure inside your abdomen and force the hernia through the opening in your muscles. Your health care provider may try to reduce the hernia by pressing on it.  Imaging studies, such as an ultrasound or CT scan. How is this treated? This condition is treated with surgery. If you have a strangulated hernia, surgery is done as soon as possible. If your hernia is small and not incarcerated, you may be asked to lose some weight before surgery. Follow these instructions at home:  Follow instructions from your health care provider about eating or drinking restrictions.  If you are overweight, your health care provider may recommend that you increase your activity level and eat a healthier diet.  Do not lift anything that is heavier than 10 lb (4.5 kg).  Return to your normal activities as told by your health care provider. Ask your health care provider what activities are safe for you. You may need to avoid activities   that increase pressure on your hernia.  Take over-the-counter and prescription medicines only as told by your health care provider.  Keep all follow-up visits as told by your health care provider. This is important. Contact a health care provider if:  Your hernia gets larger.  Your hernia becomes painful. Get help right away if:  Your hernia becomes increasingly  painful.  You have pain along with any of the following: ? Changes in skin color in the area of the hernia. ? Nausea. ? Vomiting. ? Fever. Summary  A ventral hernia is a bulge of tissue from inside the abdomen that pushes through a weak area of the muscles that form the front wall of the abdomen.  This condition is treated with surgery, which may be urgent depending on your hernia.  Do not lift anything that is heavier than 10 lb (4.5 kg), and follow activity instructions from your health care provider. This information is not intended to replace advice given to you by your health care provider. Make sure you discuss any questions you have with your health care provider. Document Revised: 11/29/2017 Document Reviewed: 05/08/2017 Elsevier Patient Education  2020 [employer]   Open Hernia Repair, Adult  Open hernia repair is a surgical procedure to fix a hernia. A hernia occurs when an internal organ or tissue pushes out through a weak spot in the abdominal wall muscles. Hernias commonly occur in the groin and around the navel. Most hernias tend to get worse over time. Often, surgery is done to prevent the hernia from becoming bigger, uncomfortable, or an emergency. Emergency surgery may be needed if abdominal contents get stuck in the opening (incarcerated hernia) or the blood supply gets cut off (strangulated hernia). In an open repair, an incision is made in the abdomen to perform the surgery. Tell a health care provider about:  Any allergies you have.  All medicines you are taking, including vitamins, herbs, eye drops, creams, and over-the-counter medicines.  Any problems you or family members have had with anesthetic medicines.  Any blood or bone disorders you have.  Any surgeries you have had.  Any medical conditions you have, including any recent cold or flu symptoms.  Whether you are pregnant or may be pregnant. What are the risks? Generally, this is a safe procedure.  However, problems may occur, including:  Long-lasting (chronic) pain.  Bleeding.  Infection.  Damage to the testicle. This can cause shrinking or swelling.  Damage to the bladder, blood vessels, intestine, or nerves near the hernia.  Trouble passing urine.  Allergic reactions to medicines.  Return of the hernia. Medicines  Ask your health care provider about: ? Changing or stopping your regular medicines. This is especially important if you are taking diabetes medicines or blood thinners. ? Taking medicines such as aspirin and ibuprofen. These medicines can thin your blood. Do not take these medicines before your procedure if your health care provider instructs you not to.  You may be given antibiotic medicine to help prevent infection. General instructions  You may have blood tests or imaging studies.  Ask your health care provider how your surgical site will be marked or identified.  If you smoke, do not smoke for at least 2 weeks before your procedure or for as long as told by your health care provider.  Let your health care provider know if you develop a cold or any infection before your surgery.  Plan to have someone take you home from the hospital or clinic.    If you will be going home right after the procedure, plan to have someone with you for 24 hours. What happens during the procedure?  To reduce your risk of infection: ? Your health care team will wash or sanitize their hands. ? Your skin will be washed with soap. ? Hair may be removed from the surgical area.  An IV tube will be inserted into one of your veins.  You will be given one or more of the following: ? A medicine to help you relax (sedative). ? A medicine to numb the area (local anesthetic). ? A medicine to make you fall asleep (general anesthetic).  Your surgeon will make an incision over the hernia.  The tissues of the hernia will be moved back into place.  The edges of the hernia may be  stitched together.  The opening in the abdominal muscles will be closed with stitches (sutures). Or, your surgeon will place a mesh patch made of manmade (synthetic) material over the opening.  The incision will be closed.  A bandage (dressing) may be placed over the incision. The procedure may vary among health care providers and hospitals. What happens after the procedure?  Your blood pressure, heart rate, breathing rate, and blood oxygen level will be monitored until the medicines you were given have worn off.  You may be given medicine for pain.  Do not drive for 24 hours if you received a sedative. This information is not intended to replace advice given to you by your health care provider. Make sure you discuss any questions you have with your health care provider. Document Revised: 09/29/2017 Document Reviewed: 03/30/2016 Elsevier Patient Education  2020 Elsevier Inc.  

## 2020-10-28 NOTE — H&P (Signed)
[facility] History and Physical  Reason for Referral: Ventral hernia  Referring Physician:  Gwenlyn Found, MD   Chief Complaint    New Patient (Initial Visit)      Katelyn Kelly is a 59 y.o. female.  HPI: Katelyn Kelly is a 59 yo with history of anxiety, IBS, migraine, IBS, DM who come in with upper abdominal pain that is associated with a mass that she feels at times. She was seen in the ED yesterday for a migraine and had been seen 2 weeks prior for the upper abdominal pain. Work up at that time demonstrated a small ventral hernia in the epigastric region with fat.  She had said she had been to her PCP and had been nervous about it being incarcerated. She says that she is very anxious about having surgery.  She has had some associated nausea but no vomiting.   Past Medical History:  Diagnosis Date   Anxiety and depression    Bilateral carpal tunnel syndrome    Chronic back pain    Chronic neck pain    Depression    Diabetes mellitus    GERD (gastroesophageal reflux disease)    H/O syncope    Headache    Hypertension    IBS (irritable bowel syndrome)    Manic depression (HCC)    Migraine    Panic attack     Past Surgical History:  Procedure Laterality Date   ABDOMINAL HYSTERECTOMY     ABDOMINAL SURGERY     APPENDECTOMY     BIOPSY  09/05/2017   Procedure: BIOPSY;  Surgeon: West Bali, MD;  Location: [facility];  Service: Endoscopy;;  random colon   BIOPSY  05/01/2018   Procedure: BIOPSY;  Surgeon: West Bali, MD;  Location: [facility];  Service: Endoscopy;;  gastric biopsy   CARPAL TUNNEL RELEASE Bilateral    CHOLECYSTECTOMY     COLONOSCOPY WITH PROPOFOL N/A 09/05/2017   Procedure: COLONOSCOPY WITH PROPOFOL;  Surgeon: West Bali, MD; normal TI, 5 small polyps, diverticulosis in the cecum, internal and external hemorrhoids.  Random colon biopsies benign.  Colon polyps were hyperplastic.  Repeat in 2023.    DILATION AND CURETTAGE OF UTERUS     ESOPHAGOGASTRODUODENOSCOPY (EGD) WITH PROPOFOL N/A 05/01/2018   Procedure: ESOPHAGOGASTRODUODENOSCOPY (EGD) WITH PROPOFOL;  Surgeon: West Bali, MD;  normal esophagus, small hiatal hernia, mild gastritis s/p biopsy, normal duodenum.  No H. pylori.    POLYPECTOMY  09/05/2017   Procedure: POLYPECTOMY;  Surgeon: West Bali, MD;  Location: [facility];  Service: Endoscopy;;  sigmoid and rectal   TUBAL LIGATION      Family History  Problem Relation Age of Onset   Diabetes Mother    Stroke Mother    Depression Mother    Colon cancer Father 8       Passed away from colon cancer   Colon cancer Brother 71       Passed away from colon cancer   Bipolar disorder Son     Social History   Tobacco Use   Smoking status: Current Every Day Smoker    Packs/day: 1.00    Years: 40.00    Pack years: 40.00    Types: Cigarettes   Smokeless tobacco: Never Used   Tobacco comment: one pack a day  Vaping Use   Vaping Use: Never used  Substance Use Topics   Alcohol use: No   Drug use: No  Medications: I have reviewed the patient's current medications. Allergies as of 10/27/2020      Reactions   Sinequan [doxepin Hcl] Anaphylaxis   Albuterol Other (See Comments)   Patient does not remember the reaction. This was a record provided via [facility]   Haloperidol Other (See Comments)   Patient does not remember the reaction. This was a record provided via [facility]   Methylphenidate Derivatives Hives   Sertraline Other (See Comments)   Patient does not remember the reaction. This was a record provided via [facility]   Tranxene [clorazepate Dipotassium] Hives, Swelling   Vortioxetine Other (See Comments)   Patient does not remember the reaction. This was a record provided via [facility]      Medication List       Accurate as of October 27, 2020 10:06 AM. If you have any questions, ask your nurse or doctor.         ALPRAZolam 0.5 MG tablet Commonly known as: Xanax Take 1 tablet (0.5 mg total) by mouth 2 (two) times daily as needed for anxiety.   Black Cohosh 40 MG Caps Take by mouth daily.   Dexilant 60 MG capsule Generic drug: dexlansoprazole 1 PO EVERY MORNING WITH BREAKFAST.   DULoxetine 60 MG capsule Commonly known as: CYMBALTA Take 2 capsules (120 mg total) by mouth daily. What changed:   how much to take  additional instructions   famotidine 20 MG tablet Commonly known as: PEPCID Take 1 tablet (20 mg total) by mouth daily.   HYDROcodone-acetaminophen 5-325 MG tablet Commonly known as: NORCO/VICODIN Take 1 tablet by mouth every 6 (six) hours as needed for severe pain.   insulin lispro 100 UNIT/ML KwikPen Commonly known as: HUMALOG Inject 2 Units into the skin 3 (three) times daily.   ketorolac 10 MG tablet Commonly known as: TORADOL Take 1 tablet (10 mg total) by mouth every 6 (six) hours as needed.   lidocaine 5 % Commonly known as: Lidoderm Place 1 patch onto the skin daily as needed. Apply patch to area most significant pain once per day.  Remove and discard patch within 12 hours of application.   ondansetron 4 MG disintegrating tablet Commonly known as: ZOFRAN-ODT Take 1 tablet (4 mg total) by mouth every 8 (eight) hours as needed for nausea or vomiting.   pramipexole 0.125 MG tablet Commonly known as: MIRAPEX Take 0.125 mg by mouth at bedtime.   ProAir HFA 108 (90 Base) MCG/ACT inhaler Generic drug: albuterol Inhale 1-2 puffs into the lungs every 6 (six) hours as needed for wheezing or shortness of breath.   SUMAtriptan 50 MG tablet Commonly known as: IMITREX Take 1 tablet by mouth as directed.   topiramate 25 MG tablet Commonly known as: TOPAMAX Take 50 mg by mouth 2 (two) times daily.   Tyler Aas FlexTouch 100 UNIT/ML FlexTouch Pen Generic drug: insulin degludec Inject 26 Units into the skin at bedtime.        ROS:  A comprehensive review  of systems was negative except for: Respiratory: positive for wheezing Gastrointestinal: positive for abdominal pain and nausea Musculoskeletal: positive for back pain and neck pain Neurological: positive for anxiety Endocrine: positive for diabetes  Blood pressure 138/66, pulse 96, temperature 98.2 F (36.8 C), temperature source Oral, resp. rate 16, height 5' 1.5" (1.562 m), weight 184 lb (83.5 kg), SpO2 95 %. Physical Exam Vitals reviewed.  Constitutional:      Appearance: Normal appearance.  HENT:     Head: Normocephalic.  Nose: Nose normal.     Mouth/Throat:     Mouth: Mucous membranes are moist.  Eyes:     Extraocular Movements: Extraocular movements intact.  Cardiovascular:     Rate and Rhythm: Normal rate and regular rhythm.  Pulmonary:     Effort: Pulmonary effort is normal.     Breath sounds: Normal breath sounds.  Abdominal:     General: There is no distension.     Palpations: Abdomen is soft.     Tenderness: There is abdominal tenderness.     Hernia: A hernia is present.     Comments: Ventral hernia in the epigastric region, nonreducible, tender, well healed RUQ scar ending medially at the hernia site  Musculoskeletal:        General: Normal range of motion.     Cervical back: Normal range of motion.  Skin:    General: Skin is warm.  Neurological:     General: No focal deficit present.     Mental Status: She is alert and oriented to person, place, and time.  Psychiatric:        Behavior: Behavior normal.     Comments: anxious     Results: Personally reviewed- small 1 cm incisional hernia at about midline, fat containing, protected by the liver inferior   CLINICAL DATA:  Epigastric ventral hernia with pain  EXAM: CT ABDOMEN AND PELVIS WITH CONTRAST  TECHNIQUE: Multidetector CT imaging of the abdomen and pelvis was performed using the standard protocol following bolus administration of intravenous contrast.  CONTRAST:  149mL OMNIPAQUE IOHEXOL 300  MG/ML  SOLN  COMPARISON:  Most recent prior 12/13/2019  FINDINGS: Lower chest: No acute abnormality.  Hepatobiliary: Mild heterogeneity particularly in the right hepatic lobe as seen previously and likely reflecting steatosis. Cholecystectomy. No biliary dilatation.  Pancreas: Unremarkable.  Spleen: Unremarkable.  Adrenals/Urinary Tract: Right larger than left adrenal nodules are again identified and likely reflect adenomas. Kidneys are unremarkable. Bladder is poorly distended with likely related wall thickening.  Stomach/Bowel: Stomach is within normal limits. There is a small area of fat density along the horizontal portion of the duodenum. This was present on prior studies and likely reflects a small lipoma. Bowel is normal in caliber.  Vascular/Lymphatic: Aortic atherosclerosis. No enlarged lymph nodes identified.  Reproductive: Status post hysterectomy. No adnexal masses.  Other: Unchanged appearance of small fat containing ventral upper abdominal wall hernia (series 2, image 18. No infiltration of the herniated fat. No new hernia. No ascites.  Musculoskeletal: Degenerative changes of the lower lumbar spine. Degenerative changes of the hips. No acute osseous abnormality.  IMPRESSION: Stable small fat containing ventral upper abdominal wall hernia without evidence of inflammatory changes.  Additional stable chronic findings detailed above.   Electronically Signed   By: Macy Mis M.D.   On: 10/08/2020 13:58  Assessment & Plan:  JEANICE PACCIONE is a 59 y.o. female with a small incisional hernia from her prior RUQ incision from her cholecystectomy. There is incarcerated fat but no bowel and would unlikely have bowel in it ever due to the liver being bowel.  Discussed with patient risk of repair and risk of bleeding, infection, use of mesh, and risk of recurrence. Discussed that she does not have to get it repaired if not having discomfort. She  reports that she has had problems with the area for 2 years and wants to get it repaired. She is very anxious.   -Discussed preop COVID testing  -Open hernia repair with mesh  -  Discussed lifting limitations in the next 6-8 weeks post op   All questions were answered to the satisfaction of the patient.    Virl Cagey 10/27/2020, 10:06 AM

## 2020-11-03 ENCOUNTER — Encounter (HOSPITAL_COMMUNITY): Payer: Self-pay

## 2020-11-03 ENCOUNTER — Encounter (HOSPITAL_COMMUNITY)
Admission: RE | Admit: 2020-11-03 | Discharge: 2020-11-03 | Disposition: A | Discharge status: Home / Self Care | Payer: Medicare Other | Source: Ambulatory Visit | Attending: General Surgery | Admitting: General Surgery

## 2020-11-03 ENCOUNTER — Other Ambulatory Visit: Payer: Self-pay

## 2020-11-03 HISTORY — DX: Personal history of other mental and behavioral disorders: Z86.59

## 2020-11-04 ENCOUNTER — Other Ambulatory Visit (HOSPITAL_COMMUNITY)
Admission: RE | Admit: 2020-11-04 | Discharge: 2020-11-04 | Disposition: A | Discharge status: Home / Self Care | Payer: Medicare Other | Source: Ambulatory Visit | Attending: General Surgery | Admitting: General Surgery

## 2020-11-04 DIAGNOSIS — Z01812 Encounter for preprocedural laboratory examination: Secondary | ICD-10-CM | POA: Diagnosis present

## 2020-11-04 DIAGNOSIS — Z20822 Contact with and (suspected) exposure to covid-19: Secondary | ICD-10-CM | POA: Diagnosis not present

## 2020-11-05 LAB — SARS CORONAVIRUS 2 (TAT 6-24 HRS): SARS Coronavirus 2: NEGATIVE

## 2020-11-06 ENCOUNTER — Ambulatory Visit (HOSPITAL_COMMUNITY): Payer: Medicare Other | Admitting: Certified Registered"

## 2020-11-06 ENCOUNTER — Encounter (HOSPITAL_COMMUNITY)
Admission: RE | Disposition: A | Discharge status: Home / Self Care | Payer: Self-pay | Source: Home / Self Care | Attending: General Surgery

## 2020-11-06 ENCOUNTER — Ambulatory Visit (HOSPITAL_COMMUNITY)
Admission: RE | Admit: 2020-11-06 | Discharge: 2020-11-06 | Disposition: A | Discharge status: Home / Self Care | Payer: Medicare Other | Attending: General Surgery | Admitting: General Surgery

## 2020-11-06 DIAGNOSIS — Z9049 Acquired absence of other specified parts of digestive tract: Secondary | ICD-10-CM | POA: Diagnosis not present

## 2020-11-06 DIAGNOSIS — K432 Incisional hernia without obstruction or gangrene: Secondary | ICD-10-CM

## 2020-11-06 DIAGNOSIS — K430 Incisional hernia with obstruction, without gangrene: Secondary | ICD-10-CM | POA: Insufficient documentation

## 2020-11-06 DIAGNOSIS — F17210 Nicotine dependence, cigarettes, uncomplicated: Secondary | ICD-10-CM | POA: Insufficient documentation

## 2020-11-06 DIAGNOSIS — Z888 Allergy status to other drugs, medicaments and biological substances status: Secondary | ICD-10-CM | POA: Diagnosis not present

## 2020-11-06 DIAGNOSIS — Z800 Family history of malignant neoplasm of digestive organs: Secondary | ICD-10-CM | POA: Insufficient documentation

## 2020-11-06 DIAGNOSIS — Z794 Long term (current) use of insulin: Secondary | ICD-10-CM | POA: Insufficient documentation

## 2020-11-06 DIAGNOSIS — Z79899 Other long term (current) drug therapy: Secondary | ICD-10-CM | POA: Diagnosis not present

## 2020-11-06 DIAGNOSIS — Z90710 Acquired absence of both cervix and uterus: Secondary | ICD-10-CM | POA: Insufficient documentation

## 2020-11-06 HISTORY — PX: INCISIONAL HERNIA REPAIR: SHX193

## 2020-11-06 LAB — GLUCOSE, CAPILLARY
Glucose-Capillary: 221 mg/dL — ABNORMAL HIGH (ref 70–99)
Glucose-Capillary: 205 mg/dL — ABNORMAL HIGH (ref 70–99)

## 2020-11-06 SURGERY — REPAIR, HERNIA, INCISIONAL
Anesthesia: General | Site: Abdomen

## 2020-11-06 MED ORDER — PROPOFOL 10 MG/ML IV BOLUS
INTRAVENOUS | Status: AC
  Filled 2020-11-06: qty 20

## 2020-11-06 MED ORDER — FENTANYL CITRATE (PF) 100 MCG/2ML IJ SOLN
INTRAMUSCULAR | Status: AC
  Filled 2020-11-06: qty 2

## 2020-11-06 MED ORDER — SUCCINYLCHOLINE CHLORIDE 200 MG/10ML IV SOSY
PREFILLED_SYRINGE | INTRAVENOUS | Status: AC
  Filled 2020-11-06: qty 10

## 2020-11-06 MED ORDER — CEFAZOLIN SODIUM-DEXTROSE 2-4 GM/100ML-% IV SOLN
2.0000 g | INTRAVENOUS | Status: AC
  Administered 2020-11-06: 2 g via INTRAVENOUS

## 2020-11-06 MED ORDER — ROCURONIUM BROMIDE 10 MG/ML (PF) SYRINGE
PREFILLED_SYRINGE | INTRAVENOUS | Status: AC
  Filled 2020-11-06: qty 10

## 2020-11-06 MED ORDER — FENTANYL CITRATE (PF) 100 MCG/2ML IJ SOLN
25.0000 ug | INTRAMUSCULAR | Status: DC | PRN
  Administered 2020-11-06 (×2): 25 ug via INTRAVENOUS
  Administered 2020-11-06: 50 ug via INTRAVENOUS

## 2020-11-06 MED ORDER — LIDOCAINE HCL (CARDIAC) PF 100 MG/5ML IV SOSY
PREFILLED_SYRINGE | INTRAVENOUS | Status: DC | PRN
  Administered 2020-11-06: 100 mg via INTRAVENOUS

## 2020-11-06 MED ORDER — MIDAZOLAM HCL 2 MG/2ML IJ SOLN
INTRAMUSCULAR | Status: AC
  Filled 2020-11-06: qty 2

## 2020-11-06 MED ORDER — ORAL CARE MOUTH RINSE: 15.0000 mL | Freq: Once | OROMUCOSAL | Status: AC

## 2020-11-06 MED ORDER — LACTATED RINGERS IV SOLN: INTRAVENOUS | Status: DC

## 2020-11-06 MED ORDER — OXYCODONE HCL 5 MG PO TABS: 5.0000 mg | ORAL_TABLET | ORAL | 0 refills | Status: DC | PRN

## 2020-11-06 MED ORDER — ONDANSETRON HCL 4 MG/2ML IJ SOLN
INTRAMUSCULAR | Status: AC
  Filled 2020-11-06: qty 2

## 2020-11-06 MED ORDER — SUGAMMADEX SODIUM 200 MG/2ML IV SOLN
INTRAVENOUS | Status: DC | PRN
Start: 2020-11-06 — End: 2020-11-06
  Administered 2020-11-06: 200 mg via INTRAVENOUS

## 2020-11-06 MED ORDER — BUPIVACAINE LIPOSOME 1.3 % IJ SUSP
INTRAMUSCULAR | Status: DC | PRN
  Administered 2020-11-06: 20 mL

## 2020-11-06 MED ORDER — PROPOFOL 10 MG/ML IV BOLUS
INTRAVENOUS | Status: DC | PRN
  Administered 2020-11-06: 200 mg via INTRAVENOUS

## 2020-11-06 MED ORDER — ONDANSETRON HCL 4 MG/2ML IJ SOLN
INTRAMUSCULAR | Status: DC | PRN
  Administered 2020-11-06: 4 mg via INTRAVENOUS

## 2020-11-06 MED ORDER — FENTANYL CITRATE (PF) 100 MCG/2ML IJ SOLN
INTRAMUSCULAR | Status: DC | PRN
  Administered 2020-11-06: 100 ug via INTRAVENOUS

## 2020-11-06 MED ORDER — SUCCINYLCHOLINE CHLORIDE 20 MG/ML IJ SOLN
INTRAMUSCULAR | Status: DC | PRN
  Administered 2020-11-06: 140 mg via INTRAVENOUS

## 2020-11-06 MED ORDER — 0.9 % SODIUM CHLORIDE (POUR BTL) OPTIME
TOPICAL | Status: DC | PRN
  Administered 2020-11-06: 1000 mL

## 2020-11-06 MED ORDER — DEXAMETHASONE SODIUM PHOSPHATE 4 MG/ML IJ SOLN
INTRAMUSCULAR | Status: DC | PRN
  Administered 2020-11-06: 4 mg via INTRAVENOUS

## 2020-11-06 MED ORDER — CHLORHEXIDINE GLUCONATE CLOTH 2 % EX PADS: 6.0000 | MEDICATED_PAD | Freq: Once | CUTANEOUS | Status: DC

## 2020-11-06 MED ORDER — LIDOCAINE HCL (PF) 2 % IJ SOLN
INTRAMUSCULAR | Status: AC
  Filled 2020-11-06: qty 5

## 2020-11-06 MED ORDER — CEFAZOLIN SODIUM-DEXTROSE 2-4 GM/100ML-% IV SOLN
INTRAVENOUS | Status: AC
  Filled 2020-11-06: qty 100

## 2020-11-06 MED ORDER — CHLORHEXIDINE GLUCONATE 0.12 % MT SOLN
15.0000 mL | Freq: Once | OROMUCOSAL | Status: AC
  Administered 2020-11-06: 15 mL via OROMUCOSAL

## 2020-11-06 MED ORDER — ONDANSETRON HCL 4 MG/2ML IJ SOLN: 4.0000 mg | Freq: Once | INTRAMUSCULAR | Status: DC | PRN

## 2020-11-06 MED ORDER — BUPIVACAINE LIPOSOME 1.3 % IJ SUSP
INTRAMUSCULAR | Status: AC
  Filled 2020-11-06: qty 20

## 2020-11-06 MED ORDER — MIDAZOLAM HCL 5 MG/5ML IJ SOLN
INTRAMUSCULAR | Status: DC | PRN
  Administered 2020-11-06: 2 mg via INTRAVENOUS

## 2020-11-06 SURGICAL SUPPLY — 43 items
ADH SKN CLS APL DERMABOND .7 (GAUZE/BANDAGES/DRESSINGS) ×1
APL PRP STRL LF DISP 70% ISPRP (MISCELLANEOUS) ×1
BLADE SURG SZ11 CARB STEEL (BLADE) ×2 IMPLANT
CHLORAPREP W/TINT 26 (MISCELLANEOUS) ×2 IMPLANT
CLOTH BEACON ORANGE TIMEOUT ST (SAFETY) ×2 IMPLANT
COVER LIGHT HANDLE STERIS (MISCELLANEOUS) ×4 IMPLANT
COVER WAND RF STERILE (DRAPES) ×2 IMPLANT
DECANTER SPIKE VIAL GLASS SM (MISCELLANEOUS) IMPLANT
DERMABOND ADVANCED (GAUZE/BANDAGES/DRESSINGS) ×1
DERMABOND ADVANCED .7 DNX12 (GAUZE/BANDAGES/DRESSINGS) ×1 IMPLANT
ELECT REM PT RETURN 9FT ADLT (ELECTROSURGICAL) ×2
ELECTRODE REM PT RTRN 9FT ADLT (ELECTROSURGICAL) ×1 IMPLANT
GAUZE SPONGE 4X4 12PLY STRL (GAUZE/BANDAGES/DRESSINGS) ×2 IMPLANT
GLOVE BIO SURGEON STRL SZ 6.5 (GLOVE) ×2 IMPLANT
GLOVE BIOGEL PI IND STRL 6.5 (GLOVE) ×1 IMPLANT
GLOVE BIOGEL PI IND STRL 7.0 (GLOVE) ×1 IMPLANT
GLOVE BIOGEL PI INDICATOR 6.5 (GLOVE) ×1
GLOVE BIOGEL PI INDICATOR 7.0 (GLOVE) ×1
GLOVE SURG SS PI 7.5 STRL IVOR (GLOVE) ×2 IMPLANT
GOWN STRL REUS W/TWL LRG LVL3 (GOWN DISPOSABLE) ×6 IMPLANT
INST SET MAJOR GENERAL (KITS) IMPLANT
INST SET MINOR GENERAL (KITS) ×2 IMPLANT
KIT TURNOVER KIT A (KITS) ×2 IMPLANT
MANIFOLD NEPTUNE II (INSTRUMENTS) ×2 IMPLANT
NDL HYPO 21X1.5 SAFETY (NEEDLE) ×1 IMPLANT
NEEDLE HYPO 21X1.5 SAFETY (NEEDLE) ×2 IMPLANT
NS IRRIG 1000ML POUR BTL (IV SOLUTION) ×2 IMPLANT
PACK MAJOR ABDOMINAL (CUSTOM PROCEDURE TRAY) IMPLANT
PACK MINOR (CUSTOM PROCEDURE TRAY) ×2 IMPLANT
PAD ARMBOARD 7.5X6 YLW CONV (MISCELLANEOUS) ×2 IMPLANT
SET BASIN LINEN APH (SET/KITS/TRAYS/PACK) ×2 IMPLANT
STAPLER VISISTAT (STAPLE) IMPLANT
SUT ETHIBOND 0 MO6 C/R (SUTURE) IMPLANT
SUT MNCRL AB 4-0 PS2 18 (SUTURE) ×2 IMPLANT
SUT SILK 3 0 (SUTURE)
SUT SILK 3-0 18XBRD TIE 12 (SUTURE) IMPLANT
SUT VIC AB 2-0 CT1 27 (SUTURE) ×2
SUT VIC AB 2-0 CT1 TAPERPNT 27 (SUTURE) ×1 IMPLANT
SUT VIC AB 3-0 SH 27 (SUTURE) ×2
SUT VIC AB 3-0 SH 27X BRD (SUTURE) ×1 IMPLANT
SUT VIC AB 4-0 PS2 27 (SUTURE) IMPLANT
SUT VICRYL AB 2 0 TIES (SUTURE) IMPLANT
SYR 20ML LL LF (SYRINGE) ×2 IMPLANT

## 2020-11-06 NOTE — Discharge Instructions (Signed)
Discharge Instructions Hernia:  Common Complaints: Pain at the incision site is common. This will improve with time. Take your pain medications as described below. Some nausea is common and poor appetite. The main goal is to stay hydrated the first few days after surgery.   Diet/ Activity: Diet as tolerated. You may not have an appetite, but it is important to stay hydrated. Drink 64 ounces of water a day. Your appetite will return with time.  Shower per your regular routine daily.  Do not take hot showers. Take warm showers that are less than 10 minutes. Rest and listen to your body, but do not remain in bed all day.Walk everyday for at least 15-20 minutes.  Deep cough and move around every 1-2 hours in the first few days after surgery. Do not pick at the dermabond glue on your incision sites.  This glue film will remain in place for 1-2 weeks and will start to peel off. Do not place lotions or balms on your incision unless instructed to specifically by Dr. Constance Haw. Do not lift > 10 lbs, perform excessive bending, pushing, pulling, squatting for 6-8 weeks after surgery. Where your abdominal binder with activity as much as possible. The activity restrictions and the abdominal binder are to prevent hernia formation at your incision while you are healing.   Pain Expectations and Narcotics: -After surgery you will have pain associated with your incisions and this is normal. The pain is muscular and nerve pain, and will get better with time. -You are encouraged and expected to take non narcotic medications like tylenol and ibuprofen (when able) to treat pain as multiple modalities can aid with pain treatment. -Narcotics are only used when pain is severe or there is breakthrough pain. -You are not expected to have a pain score of 0 after surgery, as we cannot prevent pain. A pain score of 3-4 that allows you to be functional, move, walk, and tolerate some activity is the goal. The pain will continue  to improve over the days after surgery and is dependent on your surgery. -Due to  law, we are only able to give a certain amount of pain medication to treat post operative pain, and we only give additional narcotics on a patient by patient basis.  -For most laparoscopic surgery, studies have shown that the majority of patients only need 10-15 narcotic pills, and for open surgeries most patients only need 15-20.   -Having appropriate expectations of pain and knowledge of pain management with non narcotics is important as we do not want anyone to become addicted to narcotic pain medication.  -Using ice packs in the first 48 hours and heating pads after 48 hours, wearing an abdominal binder (when recommended), and using over the counter medications are all ways to help with pain management.   -Simple acts like meditation and mindfulness practices after surgery can also help with pain control and research has proven the benefit of these practices.  Medication: Take tylenol and ibuprofen as needed for pain control, alternating every 4-6 hours.  Example:  Tylenol '1000mg'$  @ 6am, 12noon, 6pm, 65mdnight (Do not exceed '4000mg'$  of tylenol a day). Ibuprofen '800mg'$  @ 9am, 3pm, 9pm, 3am (Do not exceed '3600mg'$  of ibuprofen a day).  Take Roxicodone for breakthrough pain every 4 hours.  Take Colace for constipation related to narcotic pain medication. If you do not have a bowel movement in 2 days, take Miralax over the counter.  Drink plenty of water to also prevent constipation.  Contact Information: If you have questions or concerns, please call our office, [phone], Monday- Thursday 8AM-5PM and Friday 8AM-12Noon.  If it is after hours or on the weekend, please call [facility], [phone], and ask to speak to the surgeon on call for Dr. Constance Haw at [facility].     Open Hernia Repair, Adult, Care After These instructions give you information about caring for yourself after your procedure. Your  doctor may also give you more specific instructions. If you have problems or questions, contact your doctor. Follow these instructions at home: Surgical cut (incision) care   Follow instructions from your doctor about how to take care of your surgical cut area. Make sure you: ? Wash your hands with soap and water before you change your bandage (dressing). If you cannot use soap and water, use hand sanitizer. ? Change your bandage as told by your doctor. ? Leave stitches (sutures), skin glue, or skin tape (adhesive) strips in place. They may need to stay in place for 2 weeks or longer. If tape strips get loose and curl up, you may trim the loose edges. Do not remove tape strips completely unless your doctor says it is okay.  Check your surgical cut every day for signs of infection. Check for: ? More redness, swelling, or pain. ? More fluid or blood. ? Warmth. ? Pus or a bad smell. Activity  Do not drive or use heavy machinery while taking prescription pain medicine. Do not drive until your doctor says it is okay.  Until your doctor says it is okay: ? Do not lift anything that is heavier than 10 lb (4.5 kg). ? Do not play contact sports.  Return to your normal activities as told by your doctor. Ask your doctor what activities are safe. General instructions  To prevent or treat having a hard time pooping (constipation) while you are taking prescription pain medicine, your doctor may recommend that you: ? Drink enough fluid to keep your pee (urine) clear or pale yellow. ? Take over-the-counter or prescription medicines. ? Eat foods that are high in fiber, such as fresh fruits and vegetables, whole grains, and beans. ? Limit foods that are high in fat and processed sugars, such as fried and sweet foods.  Take over-the-counter and prescription medicines only as told by your doctor.  Do not take baths, swim, or use a hot tub until your doctor says it is okay.  Keep all follow-up visits  as told by your doctor. This is important. Contact a doctor if:  You develop a rash.  You have more redness, swelling, or pain around your surgical cut.  You have more fluid or blood coming from your surgical cut.  Your surgical cut feels warm to the touch.  You have pus or a bad smell coming from your surgical cut.  You have a fever or chills.  You have blood in your poop (stool).  You have not pooped in 2-3 days.  Medicine does not help your pain. Get help right away if:  You have chest pain or you are short of breath.  You feel light-headed.  You feel weak and dizzy (feel faint).  You have very bad pain.  You throw up (vomit) and your pain is worse. This information is not intended to replace advice given to you by your health care provider. Make sure you discuss any questions you have with your health care provider. Document Revised: 02/08/2019 Document Reviewed: 03/30/2016 Elsevier Patient Education  2020 Elsevier  Inc.     General Anesthesia, Adult, Care After This sheet gives you information about how to care for yourself after your procedure. Your health care provider may also give you more specific instructions. If you have problems or questions, contact your health care provider. What can I expect after the procedure? After the procedure, the following side effects are common:  Pain or discomfort at the IV site.  Nausea.  Vomiting.  Sore throat.  Trouble concentrating.  Feeling cold or chills.  Weak or tired.  Sleepiness and fatigue.  Soreness and body aches. These side effects can affect parts of the body that were not involved in surgery. Follow these instructions at home:  For at least 24 hours after the procedure:  Have a responsible adult stay with you. It is important to have someone help care for you until you are awake and alert.  Rest as needed.  Do not: ? Participate in activities in which you could fall or become  injured. ? Drive. ? Use heavy machinery. ? Drink alcohol. ? Take sleeping pills or medicines that cause drowsiness. ? Make important decisions or sign legal documents. ? Take care of children on your own. Eating and drinking  Follow any instructions from your health care provider about eating or drinking restrictions.  When you feel hungry, start by eating small amounts of foods that are soft and easy to digest (bland), such as toast. Gradually return to your regular diet.  Drink enough fluid to keep your urine pale yellow.  If you vomit, rehydrate by drinking water, juice, or clear broth. General instructions  If you have sleep apnea, surgery and certain medicines can increase your risk for breathing problems. Follow instructions from your health care provider about wearing your sleep device: ? Anytime you are sleeping, including during daytime naps. ? While taking prescription pain medicines, sleeping medicines, or medicines that make you drowsy.  Return to your normal activities as told by your health care provider. Ask your health care provider what activities are safe for you.  Take over-the-counter and prescription medicines only as told by your health care provider.  If you smoke, do not smoke without supervision.  Keep all follow-up visits as told by your health care provider. This is important. Contact a health care provider if:  You have nausea or vomiting that does not get better with medicine.  You cannot eat or drink without vomiting.  You have pain that does not get better with medicine.  You are unable to pass urine.  You develop a skin rash.  You have a fever.  You have redness around your IV site that gets worse. Get help right away if:  You have difficulty breathing.  You have chest pain.  You have blood in your urine or stool, or you vomit blood. Summary  After the procedure, it is common to have a sore throat or nausea. It is also common to  feel tired.  Have a responsible adult stay with you for the first 24 hours after general anesthesia. It is important to have someone help care for you until you are awake and alert.  When you feel hungry, start by eating small amounts of foods that are soft and easy to digest (bland), such as toast. Gradually return to your regular diet.  Drink enough fluid to keep your urine pale yellow.  Return to your normal activities as told by your health care provider. Ask your health care provider what activities are safe  for you. This information is not intended to replace advice given to you by your health care provider. Make sure you discuss any questions you have with your health care provider. Document Revised: 10/20/2017 Document Reviewed: 06/02/2017 Elsevier Patient Education  2020 Elsevier Inc.    Oxycodone tablets or capsules What is this medicine? OXYCODONE (ox i KOE done) is a pain reliever. It is used to treat moderate to severe pain. This medicine may be used for other purposes; ask your health care provider or pharmacist if you have questions. COMMON BRAND NAME(S): Dazidox, Endocodone, Oxaydo, OXECTA, OxyIR, Percolone, Roxicodone, Roxybond What should I tell my health care provider before I take this medicine? They need to know if you have any of these conditions:  Addison's disease  brain tumor  head injury  heart disease  history of drug or alcohol abuse problem  if you often drink alcohol  kidney disease  liver disease  lung or breathing disease, like asthma  mental illness  pancreatic disease  seizures  thyroid disease  an unusual or allergic reaction to oxycodone, codeine, hydrocodone, morphine, other medicines, foods, dyes, or preservatives  pregnant or trying to get pregnant  breast-feeding How should I use this medicine? Take this medicine by mouth with a glass of water. Follow the directions on the prescription label. You can take it with or  without food. If it upsets your stomach, take it with food. Take your medicine at regular intervals. Do not take it more often than directed. Do not stop taking except on your doctor's advice. Some brands of this medicine, like Oxecta, have special instructions. Ask your doctor or pharmacist if these directions are for you: Do not cut, crush or chew this medicine. Swallow only one tablet at a time. Do not wet, soak, or lick the tablet before you take it. A special MedGuide will be given to you by the pharmacist with each prescription and refill. Be sure to read this information carefully each time. Talk to your pediatrician regarding the use of this medicine in children. Special care may be needed. Overdosage: If you think you have taken too much of this medicine contact a poison control center or emergency room at once. NOTE: This medicine is only for you. Do not share this medicine with others. What if I miss a dose? If you miss a dose, take it as soon as you can. If it is almost time for your next dose, take only that dose. Do not take double or extra doses. What may interact with this medicine? This medicine may interact with the following medications:  alcohol  antihistamines for allergy, cough and cold  antiviral medicines for HIV or AIDS  atropine  certain antibiotics like clarithromycin, erythromycin, linezolid, rifampin  certain medicines for anxiety or sleep  certain medicines for bladder problems like oxybutynin, tolterodine  certain medicines for depression like amitriptyline, fluoxetine, sertraline  certain medicines for fungal infections like ketoconazole, itraconazole, voriconazole  certain medicines for migraine headache like almotriptan, eletriptan, frovatriptan, naratriptan, rizatriptan, sumatriptan, zolmitriptan  certain medicines for nausea or vomiting like dolasetron, ondansetron, palonosetron  certain medicines for Parkinson's disease like benztropine,  trihexyphenidyl  certain medicines for seizures like phenobarbital, phenytoin, primidone  certain medicines for stomach problems like dicyclomine, hyoscyamine  certain medicines for travel sickness like scopolamine  diuretics  general anesthetics like halothane, isoflurane, methoxyflurane, propofol  ipratropium  local anesthetics like lidocaine, pramoxine, tetracaine  MAOIs like Carbex, Eldepryl, Marplan, Nardil, and Parnate  medicines that relax muscles  for surgery  methylene blue  nilotinib  other narcotic medicines for pain or cough  phenothiazines like chlorpromazine, mesoridazine, prochlorperazine, thioridazine This list may not describe all possible interactions. Give your health care provider a list of all the medicines, herbs, non-prescription drugs, or dietary supplements you use. Also tell them if you smoke, drink alcohol, or use illegal drugs. Some items may interact with your medicine. What should I watch for while using this medicine? Tell your health care provider if your pain does not go away, if it gets worse, or if you have new or a different type of pain. You may develop tolerance to this drug. Tolerance means that you will need a higher dose of the drug for pain relief. Tolerance is normal and is expected if you take this drug for a long time. There are different types of narcotic drugs (opioids) for pain. If you take more than one type at the same time, you may have more side effects. Give your health care provider a list of all drugs you use. He or she will tell you how much drug to take. Do not take more drug than directed. Get emergency help right away if you have problems breathing. Do not suddenly stop taking your drug because you may develop a severe reaction. Your body becomes used to the drug. This does NOT mean you are addicted. Addiction is a behavior related to getting and using a drug for a nonmedical reason. If you have pain, you have a medical reason  to take pain drug. Your health care provider will tell you how much drug to take. If your health care provider wants you to stop the drug, the dose will be slowly lowered over time to avoid any side effects. Talk to your health care provider about naloxone and how to get it. Naloxone is an emergency drug used for an opioid overdose. An overdose can happen if you take too much opioid. It can also happen if an opioid is taken with some other drugs or substances, like alcohol. Know the symptoms of an overdose, like trouble breathing, unusually tired or sleepy, or not being able to respond or wake up. Make sure to tell caregivers and close contacts where it is stored. Make sure they know how to use it. After naloxone is given, you must get emergency help right away. Naloxone is a temporary treatment. Repeat doses may be needed. You may get drowsy or dizzy. Do not drive, use machinery, or do anything that needs mental alertness until you know how this drug affects you. Do not stand up or sit up quickly, especially if you are an older patient. This reduces the risk of dizzy or fainting spells. Alcohol may interfere with the effect of this drug. Avoid alcoholic drinks. This drug will cause constipation. If you do not have a bowel movement for 3 days, call your health care provider. Your mouth may get dry. Chewing sugarless gum or sucking hard candy and drinking plenty of water may help. Contact your health care provider if the problem does not go away or is severe. The tablet shell for some brands of this drug does not dissolve. This is normal. The tablet shell may appear whole in the stool. This is not a cause for concern. What side effects may I notice from receiving this medicine? Side effects that you should report to your doctor or health care professional as soon as possible:  allergic reactions like skin rash, itching or hives, swelling of  the face, lips, or tongue  breathing  problems  confusion  signs and symptoms of low blood pressure like dizziness; feeling faint or lightheaded, falls; unusually weak or tired  trouble passing urine or change in the amount of urine  trouble swallowing Side effects that usually do not require medical attention (report to your doctor or health care professional if they continue or are bothersome):  constipation  dry mouth  nausea, vomiting  tiredness This list may not describe all possible side effects. Call your doctor for medical advice about side effects. You may report side effects to FDA at 1-800-FDA-1088. Where should I keep my medicine? Keep out of the reach of children. This medicine can be abused. Keep your medicine in a safe place to protect it from theft. Do not share this medicine with anyone. Selling or giving away this medicine is dangerous and against the law. Store at room temperature between 15 and 30 degrees C (59 and 86 degrees F). Protect from light. Keep container tightly closed. This medicine may cause harm and death if it is taken by other adults, children, or pets. Return medicine that has not been used to an official disposal site. Contact the DEA at (432)722-8674 or your city/county government to find a site. If you cannot return the medicine, flush it down the toilet. Do not use the medicine after the expiration date. NOTE: This sheet is a summary. It may not cover all possible information. If you have questions about this medicine, talk to your doctor, pharmacist, or health care provider.  2020 Elsevier/Gold Standard (2019-05-28 12:47:59)      Bupivacaine Liposomal Suspension for Injection What is this medicine? BUPIVACAINE LIPOSOMAL (bue PIV a kane LIP oh som al) is an anesthetic. It causes loss of feeling in the skin or other tissues. It is used to prevent and to treat pain from some procedures. This medicine may be used for other purposes; ask your health care provider or pharmacist if you  have questions. COMMON BRAND NAME(S): EXPAREL What should I tell my health care provider before I take this medicine? They need to know if you have any of these conditions:  G6PD deficiency  heart disease  kidney disease  liver disease  low blood pressure  lung or breathing disease, like asthma  an unusual or allergic reaction to bupivacaine, other medicines, foods, dyes, or preservatives  pregnant or trying to get pregnant  breast-feeding How should I use this medicine? This medicine is for injection into the affected area. It is given by a health care professional in a hospital or clinic setting. Talk to your pediatrician regarding the use of this medicine in children. Special care may be needed. Overdosage: If you think you have taken too much of this medicine contact a poison control center or emergency room at once. NOTE: This medicine is only for you. Do not share this medicine with others. What if I miss a dose? This does not apply. What may interact with this medicine? This medicine may interact with the following medications:  acetaminophen  certain antibiotics like dapsone, nitrofurantoin, aminosalicylic acid, sulfonamides  certain medicines for seizures like phenobarbital, phenytoin, valproic acid  chloroquine  cyclophosphamide  flutamide  hydroxyurea  ifosfamide  metoclopramide  nitric oxide  nitroglycerin  nitroprusside  nitrous oxide  other local anesthetics like lidocaine, pramoxine, tetracaine  primaquine  quinine  rasburicase  sulfasalazine This list may not describe all possible interactions. Give your health care provider a list of all the medicines,  herbs, non-prescription drugs, or dietary supplements you use. Also tell them if you smoke, drink alcohol, or use illegal drugs. Some items may interact with your medicine. What should I watch for while using this medicine? Your condition will be monitored carefully while you are  receiving this medicine. Be careful to avoid injury while the area is numb, and you are not aware of pain. What side effects may I notice from receiving this medicine? Side effects that you should report to your doctor or health care professional as soon as possible:  allergic reactions like skin rash, itching or hives, swelling of the face, lips, or tongue  seizures  signs and symptoms of a dangerous change in heartbeat or heart rhythm like chest pain; dizziness; fast, irregular heartbeat; palpitations; feeling faint or lightheaded; falls; breathing problems  signs and symptoms of methemoglobinemia such as pale, gray, or blue colored skin; headache; fast heartbeat; shortness of breath; feeling faint or lightheaded, falls; tiredness Side effects that usually do not require medical attention (report to your doctor or health care professional if they continue or are bothersome):  anxious  back pain  changes in taste  changes in vision  constipation  dizziness  fever  nausea, vomiting This list may not describe all possible side effects. Call your doctor for medical advice about side effects. You may report side effects to FDA at 1-800-FDA-1088. Where should I keep my medicine? This drug is given in a hospital or clinic and will not be stored at home. NOTE: This sheet is a summary. It may not cover all possible information. If you have questions about this medicine, talk to your doctor, pharmacist, or health care provider.  2020 Elsevier/Gold Standard (2019-07-30 10:48:23)    [facility] [place] [facility] Tuesday January 11,2022. DO NOT USE ANY ADDITIONAL NUMBING MEDICATIONS WITHOUT CONSULTING A PHYSICIAN UNTIL AFTER TUESDAY

## 2020-11-06 NOTE — Anesthesia Procedure Notes (Signed)
Procedure Name: Intubation Performed by: Tacy Learn, CRNA Pre-anesthesia Checklist: Patient identified, Emergency Drugs available, Suction available, Patient being monitored and Timeout performed Patient Re-evaluated:Patient Re-evaluated prior to induction Oxygen Delivery Method: Circle system utilized Preoxygenation: Pre-oxygenation with 100% oxygen Induction Type: IV induction Laryngoscope Size: Miller and 2 Grade View: Grade I Tube size: 6.5 mm Number of attempts: 1 Airway Equipment and Method: Stylet Placement Confirmation: ETT inserted through vocal cords under direct vision,  positive ETCO2,  CO2 detector and breath sounds checked- equal and bilateral Secured at: 21 cm Tube secured with: Tape Dental Injury: Teeth and Oropharynx as per pre-operative assessment

## 2020-11-06 NOTE — Progress Notes (Signed)
[facility]  Attempted to call the sister but no answer and no voicemail. Rx sent to [facility]. Will see in 1 month. No heavy lifting > 10 lbs, excessive bending, pushing, pulling, or squatting for 6-8 weeks after surgery.   Curlene Labrum, MD [facility] [address] [place], [place] [address] [phone] (office)

## 2020-11-06 NOTE — Anesthesia Preprocedure Evaluation (Signed)
Anesthesia Evaluation  Patient identified by MRN, date of birth, ID band Patient awake    Reviewed: Allergy & Precautions, H&P , NPO status , Patient's Chart, lab work & pertinent test results, reviewed documented beta blocker date and time   Airway Mallampati: II  TM Distance: >3 FB Neck ROM: full    Dental no notable dental hx. (+) Edentulous Upper, Edentulous Lower   Pulmonary neg pulmonary ROS, Current Smoker,    Pulmonary exam normal breath sounds clear to auscultation       Cardiovascular Exercise Tolerance: Good hypertension, negative cardio ROS   Rhythm:regular Rate:Normal     Neuro/Psych  Headaches, PSYCHIATRIC DISORDERS Anxiety Depression Bipolar Disorder  Neuromuscular disease    GI/Hepatic Neg liver ROS, GERD  Medicated,  Endo/Other  negative endocrine ROSdiabetes, Type 2  Renal/GU negative Renal ROS  negative genitourinary   Musculoskeletal   Abdominal   Peds  Hematology negative hematology ROS (+)   Anesthesia Other Findings   Reproductive/Obstetrics negative OB ROS                             Anesthesia Physical Anesthesia Plan  ASA: III  Anesthesia Plan: General   Post-op Pain Management:    Induction:   PONV Risk Score and Plan: Ondansetron  Airway Management Planned:   Additional Equipment:   Intra-op Plan:   Post-operative Plan:   Informed Consent: I have reviewed the patients History and Physical, chart, labs and discussed the procedure including the risks, benefits and alternatives for the proposed anesthesia with the patient or authorized representative who has indicated his/her understanding and acceptance.     Dental Advisory Given  Plan Discussed with: CRNA  Anesthesia Plan Comments:         Anesthesia Quick Evaluation

## 2020-11-06 NOTE — Transfer of Care (Signed)
Immediate Anesthesia Transfer of Care Note  Patient: Katelyn Kelly  Procedure(s) Performed: HERNIA REPAIR INCISIONAL, PRIMARY REPAIR (N/A Abdomen)  Patient Location: PACU  Anesthesia Type:General  Level of Consciousness: awake, alert , oriented and patient cooperative  Airway & Oxygen Therapy: Patient Spontanous Breathing and Patient connected to face mask oxygen  Post-op Assessment: Report given to RN, Post -op Vital signs reviewed and stable and Patient moving all extremities  Post vital signs: Reviewed and stable  Last Vitals:  Vitals Value Taken Time  BP    Temp    Pulse 86 11/06/20 0911  Resp    SpO2 85 % 11/06/20 0911  Vitals shown include unvalidated device data.  Last Pain:  Vitals:   11/06/20 0816  PainSc: 0-No pain         Complications: No complications documented.

## 2020-11-06 NOTE — Interval H&P Note (Signed)
History and Physical Interval Note:  11/06/2020 8:24 AM  Katelyn Kelly  has presented today for surgery, with the diagnosis of Incisional hernia.  The various methods of treatment have been discussed with the patient and family. After consideration of risks, benefits and other options for treatment, the patient has consented to  Procedure(s): HERNIA REPAIR INCISIONAL WITH MESH (N/A) as a surgical intervention.  The patient's history has been reviewed, patient examined, no change in status, stable for surgery.  I have reviewed the patient's chart and labs.  Questions were answered to the patient's satisfaction.    No changes.   Virl Cagey

## 2020-11-06 NOTE — Anesthesia Postprocedure Evaluation (Signed)
Anesthesia Post Note  Patient: Katelyn Kelly  Procedure(s) Performed: HERNIA REPAIR INCISIONAL, PRIMARY REPAIR (N/A Abdomen)  Patient location during evaluation: PACU Anesthesia Type: General Level of consciousness: oriented, awake, awake and alert and patient cooperative Pain management: pain level controlled Vital Signs Assessment: post-procedure vital signs reviewed and stable Respiratory status: spontaneous breathing, respiratory function stable, nonlabored ventilation and non-rebreather facemask Cardiovascular status: blood pressure returned to baseline and stable Postop Assessment: no headache and no backache Anesthetic complications: no   No complications documented.   Last Vitals:  Vitals:   11/06/20 0815 11/06/20 0816  BP:  134/81  Pulse:  88  Resp:  19  Temp: 37.3 C   SpO2:  95%    Last Pain:  Vitals:   11/06/20 0816  PainSc: 0-No pain                 Tacy Learn

## 2020-11-06 NOTE — Op Note (Addendum)
[facility] Operative Note  11/06/20  Preoperative Diagnosis: Incisional hernia, incarcerated with fat    Postoperative Diagnosis: Same   Procedure(s) Performed: Primary repair incisional hernia    Surgeon: Lanell Matar. Constance Haw, MD   Assistants: No qualified resident was available    Anesthesia: General endotracheal   Anesthesiologist: Louann Sjogren, MD    Specimens: None    Estimated Blood Loss: Minimal   Blood Replacement: None    Complications: None   Wound Class: Clean    Operative Indications: Ms. Ballowe is a 60 yo with a hernia on the medial aspect of her right upper quadrant incision from her gallbladder surgery. This is a small 1cm fat containing hernia. We discussed the risk of repair including bleeding, infection, and recurrence and possibility of using mesh.  Discussed that some of her symptoms are not likely related to this small hernia with fat that is over the liver.   Findings: Small 1cm hernia with fat   Procedure: The patient was taken to the operating room and placed supine. General endotracheal anesthesia was induced. Intravenous antibiotics were  administered per protocol.  An orogastric tube positioned to decompress the stomach. The abdomen was prepared and draped in the usual sterile fashion.   The right upper quadrant incision was used and I opened up on the medial portion for about 3cm.  I dissected down in to the subcutaneous tissue with electrocautery and a hernia sac with fat was noted. This was opened and the fat was reduced. Inferior I could feel the liver. The defect was 1cm in size. Given the location and the size, I opted to close this primarily with 0 Ethibond interrupted sutures. Exparel was injected. The cavity was closed with interrupted 3-0 Vicryl.  and the skin was closed with a running 4-0 Monocryl subcuticular and dermabond.   Final inspection revealed acceptable hemostasis. All counts were correct at the end of the case.  The patient was awakened from anesthesia and extubated without complication.  The patient went to the PACU in stable condition.   Curlene Labrum, MD [facility] [address] [place], [place] [address] [phone] (office)

## 2020-11-09 ENCOUNTER — Encounter (HOSPITAL_COMMUNITY): Payer: Self-pay | Admitting: General Surgery

## 2020-11-09 NOTE — Progress Notes (Signed)
Patient called short stay department today and spoke with Abbie Sons, RN stating she was hurting at surgical site and swollen in her legs. States she still has abdominal binder on and has not taken it off since placed after surgery in PACU.  Advised to remove binder and take a shower drink and eat and if she has further questions and concerns to call Dr. Constance Haw office for further recommendations.  If she does not speak to staff at Dr. Constance Haw' office to leave a message and Dr. Constance Haw will call her back. She verbalized understanding

## 2020-11-13 ENCOUNTER — Other Ambulatory Visit: Payer: Self-pay

## 2020-11-13 ENCOUNTER — Encounter (HOSPITAL_COMMUNITY): Payer: Self-pay | Admitting: *Deleted

## 2020-11-13 ENCOUNTER — Emergency Department (HOSPITAL_COMMUNITY)
Admission: EM | Admit: 2020-11-13 | Discharge: 2020-11-13 | Disposition: A | Discharge status: Home / Self Care | Payer: Medicare Other | Attending: Emergency Medicine | Admitting: Emergency Medicine

## 2020-11-13 ENCOUNTER — Emergency Department (HOSPITAL_COMMUNITY): Payer: Medicare Other

## 2020-11-13 DIAGNOSIS — F17210 Nicotine dependence, cigarettes, uncomplicated: Secondary | ICD-10-CM | POA: Insufficient documentation

## 2020-11-13 DIAGNOSIS — I10 Essential (primary) hypertension: Secondary | ICD-10-CM | POA: Diagnosis not present

## 2020-11-13 DIAGNOSIS — R600 Localized edema: Secondary | ICD-10-CM | POA: Insufficient documentation

## 2020-11-13 DIAGNOSIS — E119 Type 2 diabetes mellitus without complications: Secondary | ICD-10-CM | POA: Insufficient documentation

## 2020-11-13 DIAGNOSIS — R609 Edema, unspecified: Secondary | ICD-10-CM

## 2020-11-13 LAB — BASIC METABOLIC PANEL
Anion gap: 8 (ref 5–15)
BUN: 19 mg/dL (ref 8–23)
CO2: 26 mmol/L (ref 22–32)
Calcium: 8.8 mg/dL — ABNORMAL LOW (ref 8.9–10.3)
Chloride: 102 mmol/L (ref 98–111)
Creatinine, Ser: 1.14 mg/dL — ABNORMAL HIGH (ref 0.44–1.00)
GFR, Estimated: 55 mL/min — ABNORMAL LOW (ref >60)
Glucose, Bld: 269 mg/dL — ABNORMAL HIGH (ref 70–99)
Potassium: 4.3 mmol/L (ref 3.5–5.1)
Sodium: 136 mmol/L (ref 135–145)

## 2020-11-13 LAB — CBC WITH DIFFERENTIAL/PLATELET
Abs Immature Granulocytes: 0.02 10*3/uL (ref 0.00–0.07)
Basophils Absolute: 0.1 10*3/uL (ref 0.0–0.1)
Basophils Relative: 1 %
Eosinophils Absolute: 0.2 10*3/uL (ref 0.0–0.5)
Eosinophils Relative: 2 %
HCT: 47.2 % — ABNORMAL HIGH (ref 36.0–46.0)
Hemoglobin: 15.2 g/dL — ABNORMAL HIGH (ref 12.0–15.0)
Immature Granulocytes: 0 %
Lymphocytes Relative: 27 %
Lymphs Abs: 3.1 10*3/uL (ref 0.7–4.0)
MCH: 32.1 pg (ref 26.0–34.0)
MCHC: 32.2 g/dL (ref 30.0–36.0)
MCV: 99.8 fL (ref 80.0–100.0)
Monocytes Absolute: 0.8 10*3/uL (ref 0.1–1.0)
Monocytes Relative: 7 %
Neutro Abs: 6.9 10*3/uL (ref 1.7–7.7)
Neutrophils Relative %: 63 %
Platelets: 231 10*3/uL (ref 150–400)
RBC: 4.73 MIL/uL (ref 3.87–5.11)
RDW: 12.6 % (ref 11.5–15.5)
WBC: 11.2 10*3/uL — ABNORMAL HIGH (ref 4.0–10.5)
nRBC: 0 % (ref 0.0–0.2)

## 2020-11-13 LAB — BRAIN NATRIURETIC PEPTIDE: B Natriuretic Peptide: 240 pg/mL — ABNORMAL HIGH (ref 0.0–100.0)

## 2020-11-13 MED ORDER — FUROSEMIDE 20 MG PO TABS: 20.0000 mg | ORAL_TABLET | Freq: Every day | ORAL | 0 refills | Status: DC

## 2020-11-13 MED ORDER — FUROSEMIDE 10 MG/ML IJ SOLN
20.0000 mg | Freq: Once | INTRAMUSCULAR | Status: AC
  Administered 2020-11-13: 20 mg via INTRAVENOUS
  Filled 2020-11-13: qty 2

## 2020-11-13 NOTE — ED Provider Notes (Signed)
Emergency Department Provider Note   I have reviewed the triage vital signs and the nursing notes.   HISTORY  Chief Complaint Swelling   HPI Katelyn Kelly is a 60 y.o. female with past medical history reviewed below currently 1 week status post primary repair of incisional hernia presents with bilateral lower extremity swelling worsening over the past several days.  Patient has no prior of similar swelling in the past.  She first noticed this on Monday.  She denies any chest pain or shortness of breath symptoms.  No fevers or chills.  Her postoperative pain is overall improving.  She has been wearing her abdominal binder.  She notes that she is eating and drinking and making urine.  She is not experiencing fever.  She has not noticed redness or drainage from the incision site.  She states that she initially called the surgery team and also her PCP.  She has not heard back from her PCP and so going into the weekend she was getting increasingly concerned so presents to the ED for evaluation. No prior history of CHF.    Past Medical History:  Diagnosis Date  . Anxiety and depression   . Bilateral carpal tunnel syndrome   . Chronic back pain   . Chronic neck pain   . Depression   . Diabetes mellitus   . GERD (gastroesophageal reflux disease)   . H/O syncope   . Headache   . History of panic attacks   . Hypertension   . IBS (irritable bowel syndrome)   . Manic depression ([place])   . Migraine   . Panic attack     Patient Active Problem List   Diagnosis Date Noted  . Incisional hernia, without obstruction or gangrene 10/27/2020  . Early satiety 05/28/2020  . Panic disorder 12/11/2019  . Flank pain 12/09/2019  . Diastasis recti 04/11/2019  . Intermittent upper abdominal pain 03/28/2019  . MDD (major depressive disorder), recurrent episode, moderate ([place]) 11/26/2018  . PTSD (post-traumatic stress disorder) 11/26/2018  . Nausea with vomiting 02/14/2018  . GERD  (gastroesophageal reflux disease) 11/08/2017  . Dysphagia 11/08/2017  . Taking multiple medications for chronic disease 08/02/2017  . Family history of colon cancer 08/02/2017  . IBS (irritable bowel syndrome) 08/02/2017  . LATERAL EPICONDYLITIS 08/27/2009  . JOINT PAIN, HAND 07/30/2009  . TRIGGER FINGER 07/30/2009  . CARPAL TUNNEL SYNDROME 03/04/2009  . CERVICAL RADICULITIS 03/04/2009  . NUMBNESS, HAND 02/02/2009    Past Surgical History:  Procedure Laterality Date  . ABDOMINAL HYSTERECTOMY    . ABDOMINAL SURGERY    . APPENDECTOMY    . BIOPSY  09/05/2017   Procedure: BIOPSY;  Surgeon: [name], MD;  Location: [facility];  Service: Endoscopy;;  random colon  . BIOPSY  05/01/2018   Procedure: BIOPSY;  Surgeon: [name], MD;  Location: [facility];  Service: Endoscopy;;  gastric biopsy  . CARPAL TUNNEL RELEASE Bilateral   . CHOLECYSTECTOMY    . COLONOSCOPY WITH PROPOFOL N/A 09/05/2017   Procedure: COLONOSCOPY WITH PROPOFOL;  Surgeon: [name], MD; normal TI, 5 small polyps, diverticulosis in the cecum, internal and external hemorrhoids.  Random colon biopsies benign.  Colon polyps were hyperplastic.  Repeat in 2023.  Marland Kitchen DILATION AND CURETTAGE OF UTERUS    . ESOPHAGOGASTRODUODENOSCOPY (EGD) WITH PROPOFOL N/A 05/01/2018   Procedure: ESOPHAGOGASTRODUODENOSCOPY (EGD) WITH PROPOFOL;  Surgeon: [name], MD;  normal esophagus, small hiatal hernia, mild gastritis s/p biopsy, normal duodenum.  No H. pylori.   Marland Kitchen HERNIA REPAIR    . INCISIONAL HERNIA REPAIR N/A 11/06/2020   Procedure: HERNIA REPAIR INCISIONAL, PRIMARY REPAIR;  Surgeon: Virl Cagey, MD;  Location: [facility] ORS;  Service: General;  Laterality: N/A;  . POLYPECTOMY  09/05/2017   Procedure: POLYPECTOMY;  Surgeon: [name], MD;  Location: [facility];  Service: Endoscopy;;  sigmoid and rectal  . TUBAL LIGATION      Allergies Meprobamate, Sinequan [doxepin hcl], Albuterol, Haloperidol, Sertraline,  Tranxene [clorazepate dipotassium], and Vortioxetine  Family History  Problem Relation Age of Onset  . Diabetes Mother   . Stroke Mother   . Depression Mother   . Colon cancer Father 56       Passed away from colon cancer  . Colon cancer Brother 85       Passed away from colon cancer  . Bipolar disorder Son     Social History Social History   Tobacco Use  . Smoking status: Current Every Day Smoker    Packs/day: 1.00    Years: 40.00    Pack years: 40.00    Types: Cigarettes  . Smokeless tobacco: Never Used  . Tobacco comment: one pack a day  Vaping Use  . Vaping Use: Never used  Substance Use Topics  . Alcohol use: No  . Drug use: No    Review of Systems  Constitutional: No fever/chills Eyes: No visual changes. ENT: No sore throat. Cardiovascular: Denies chest pain. Positive LE edema bilaterally.  Respiratory: Denies shortness of breath. Gastrointestinal: No abdominal pain.  No nausea, no vomiting.  No diarrhea.  No constipation. Genitourinary: Negative for dysuria. Musculoskeletal: Negative for back pain. Skin: Negative for rash. Neurological: Negative for headaches, focal weakness or numbness.  10-point ROS otherwise negative.  ____________________________________________   PHYSICAL EXAM:  VITAL SIGNS: ED Triage Vitals  Enc Vitals Group     BP 11/13/20 1614 127/76     Pulse Rate 11/13/20 1614 (!) 102     Resp 11/13/20 1614 20     Temp 11/13/20 1614 98.4 F (36.9 C)     Temp Source 11/13/20 1614 Oral     SpO2 11/13/20 1614 96 %     Weight 11/13/20 1615 180 lb (81.6 kg)     Height 11/13/20 1615 5' 1.5" (1.562 m)   Constitutional: Alert and oriented. Well appearing and in no acute distress. Eyes: Conjunctivae are normal.  Head: Atraumatic. Nose: No congestion/rhinnorhea. Mouth/Throat: Mucous membranes are moist.   Neck: No stridor.   Cardiovascular: Normal rate, regular rhythm. Good peripheral circulation. Grossly normal heart sounds.    Respiratory: Normal respiratory effort.  No retractions. Lungs CTAB. Gastrointestinal: Soft and nontender. No distention.  Musculoskeletal: No lower extremity tenderness. Bilateral 1+ pitting edema to the knees. No redness or warmth unilaterally. No joint redness or swelling (knees/ankles).  Neurologic:  Normal speech and language. No gross focal neurologic deficits are appreciated.  Skin:  Skin is warm, dry and intact. No rash noted.  Hernia incision in the mid upper abdomen is well-appearing with tissue adhesive overlying the wound. No surrounding cellulitis.   ____________________________________________   LABS (all labs ordered are listed, but only abnormal results are displayed)  Labs Reviewed  CBC WITH DIFFERENTIAL/PLATELET - Abnormal; Notable for the following components:      Result Value   WBC 11.2 (*)    Hemoglobin 15.2 (*)    HCT 47.2 (*)    All other components within normal limits  BASIC METABOLIC PANEL -  Abnormal; Notable for the following components:   Glucose, Bld 269 (*)    Creatinine, Ser 1.14 (*)    Calcium 8.8 (*)    GFR, Estimated 55 (*)    All other components within normal limits  BRAIN NATRIURETIC PEPTIDE - Abnormal; Notable for the following components:   B Natriuretic Peptide 240.0 (*)    All other components within normal limits   ____________________________________________  RADIOLOGY  DG Chest Portable 1 View  Result Date: 11/13/2020 CLINICAL DATA:  Lower extremity edema. EXAM: PORTABLE CHEST 1 VIEW COMPARISON:  August 16, 2018 FINDINGS: Very mild atelectasis and/or early infiltrate is seen within the bilateral lung bases. There is no evidence of a pleural effusion or pneumothorax. The heart size and mediastinal contours are within normal limits. The visualized skeletal structures are unremarkable. IMPRESSION: Very mild bibasilar atelectasis and/or early infiltrate. Electronically Signed   By: Virgina Norfolk M.D.   On: 11/13/2020 17:44     ____________________________________________   PROCEDURES  Procedure(s) performed:   Procedures  None  ____________________________________________   INITIAL IMPRESSION / ASSESSMENT AND PLAN / ED COURSE  Pertinent labs & imaging results that were available during my care of the patient were reviewed by me and considered in my medical decision making (see chart for details).   Patient presents emergency department for evaluation of bilateral lower extremity swelling starting over the past week.  No shortness of breath or hypoxemia.  She does have mild tachycardia here.  Doubt PE/DVT/cellulitis.  Patient subjectively feels like the swelling is up to her thighs and that her hands are now swelling.  She does have clinically appreciable swelling to the knee bilaterally.  No history of CHF.  Plan for labs to assess kidney function along with BNP and chest x-ray to assess for pulmonary edema versus pleural effusion but patient is not having respiratory symptoms at this time. She is eating/drinking well.   07:15 PM  Patient's lab work is overall reassuring.  No significant kidney injury.  Electrolytes are within normal limits.  BNP is mildly elevated.  Chest x-ray with likely atelectasis at the bases as she does not have pneumonia symptoms postoperatively or otherwise.  Incision is well-appearing.  Plan for dose of IV Lasix here followed by 3 days of low-dose Lasix at home.  She has a PCP appointment scheduled for the 20th.  She will discuss her symptoms at that time and further testing as needed per PCP recommendation.  Discussed ED return precautions. Patient comfortable with the plan at discharge.  ____________________________________________  FINAL CLINICAL IMPRESSION(S) / ED DIAGNOSES  Final diagnoses:  Edema, peripheral     MEDICATIONS GIVEN DURING THIS VISIT:  Medications  furosemide (LASIX) injection 20 mg (has no administration in time range)     NEW OUTPATIENT MEDICATIONS  STARTED DURING THIS VISIT:  New Prescriptions   FUROSEMIDE (LASIX) 20 MG TABLET    Take 1 tablet (20 mg total) by mouth daily for 3 days.    Note:  This document was prepared using Dragon voice recognition software and may include unintentional dictation errors.  Nanda Quinton, MD, [facility] Emergency Medicine    Vadhir Mcnay, [facility], MD 11/13/20 [phone]

## 2020-11-13 NOTE — ED Notes (Signed)
Patient discharged to home.  All discharge instructions reviewed.  Patient verbalized understanding via teachback method.  Wheelchair out of ED. 

## 2020-11-13 NOTE — Discharge Instructions (Signed)
You were seen in the emergency room today with swelling in your legs.  I am starting you on Lasix for the next several days to help with symptoms.  Please follow closely with your primary care doctor by calling the office on Monday to confirm your follow-up appointment on the 20th.  If you continue to have swelling your primary care doctor may order additional tests.  You can use compression stockings, which can be purchased at the pharmacy, on your legs.  When at rest, keep them elevated above the level of your heart.  The Lasix will cause you to urinate frequently.  Return to the emergency department any pain in your chest, worsening swelling, shortness of breath symptoms, or fever.

## 2020-11-13 NOTE — ED Triage Notes (Signed)
Pt had recent hernia surgery on 1/7, noted generalized swelling since Monday.

## 2020-11-14 IMAGING — MG DIGITAL DIAGNOSTIC BILAT W/ TOMO W/ CAD
8 series · 8 of 24 positions shown · non-contrast
Comparison: None.

CLINICAL DATA: Patient describes bilateral greenish discharge from
multiple ducts, only when expressed. Symptoms for years.

EXAM:
DIGITAL DIAGNOSTIC BILATERAL MAMMOGRAM WITH TOMO AND CAD

[L CC synth-2D]
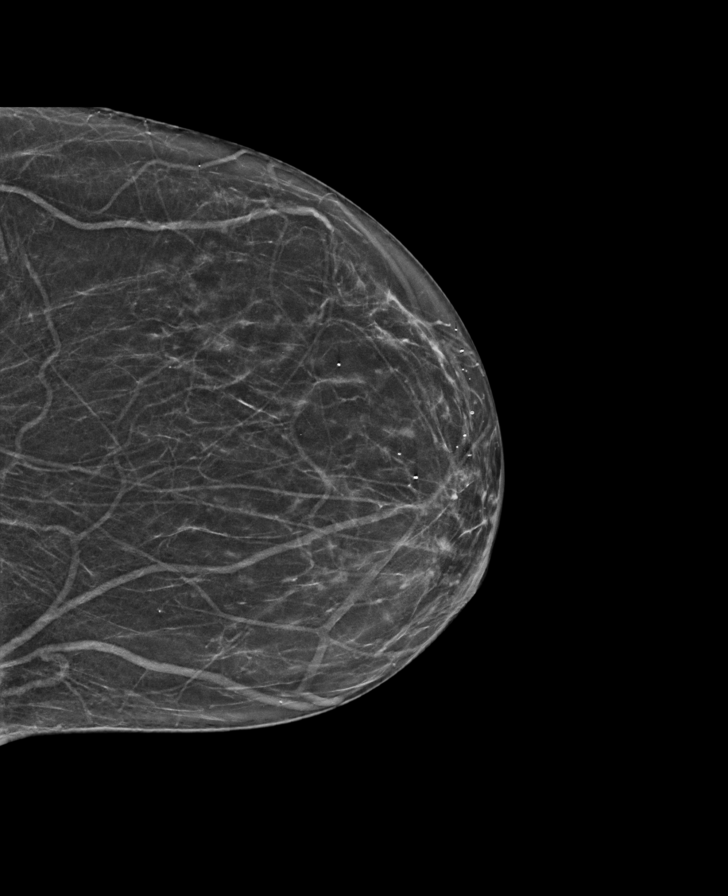

[R CC synth-2D]
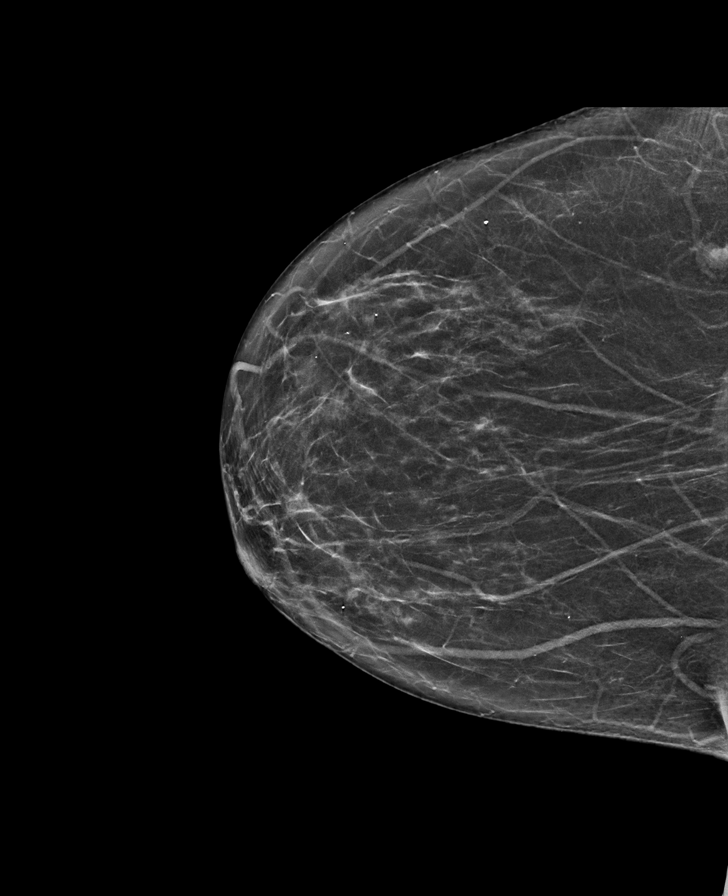

[L MLO synth-2D]
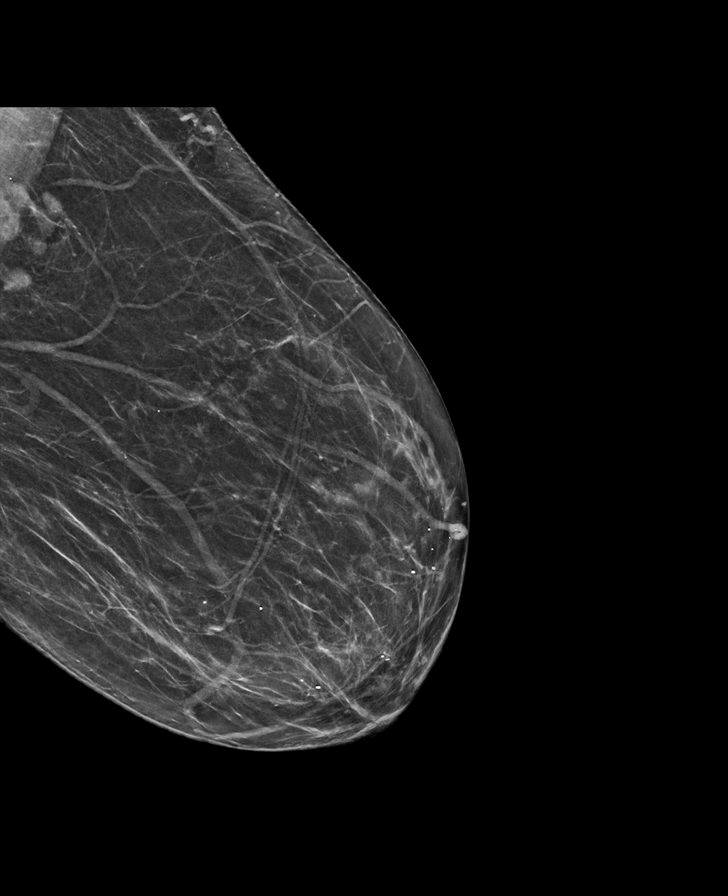

[R MLO synth-2D]
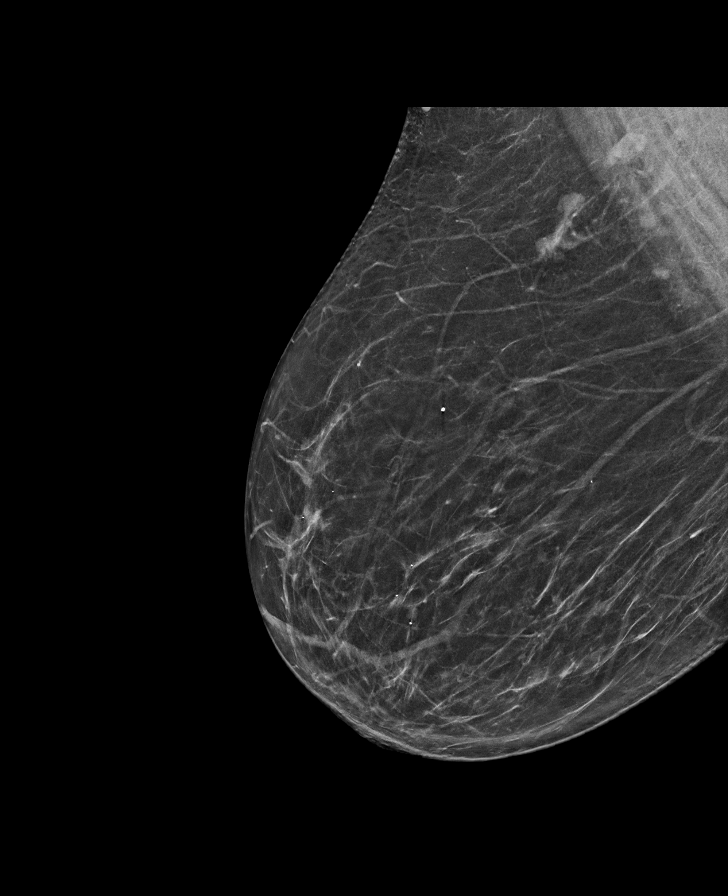

[L MLO tomo · tomo slice 33/65.0]
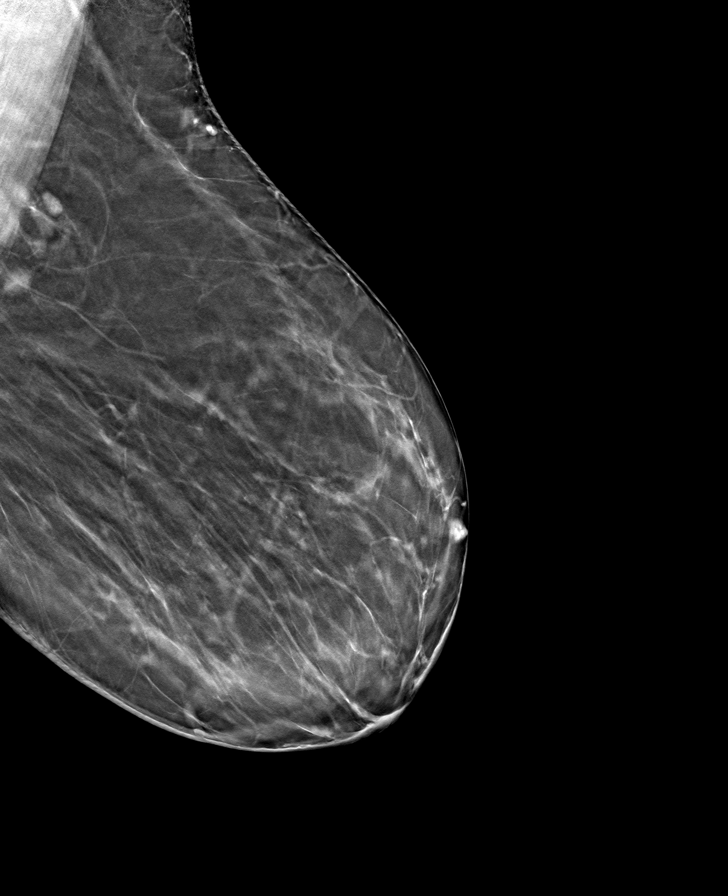

[R CC tomo · tomo slice 31/62.0]
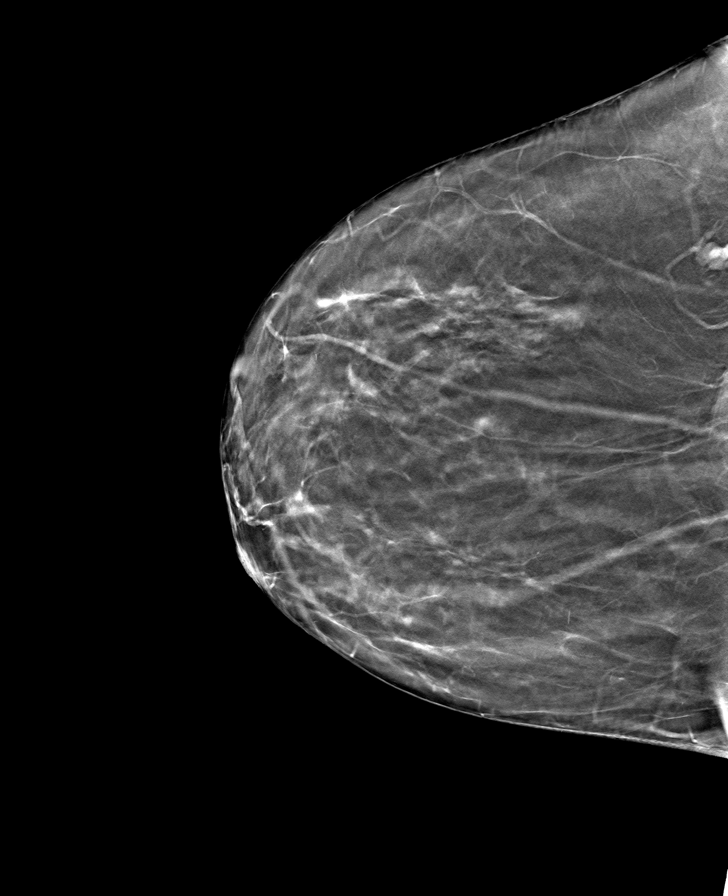

[R MLO tomo · tomo slice 34/67.0]
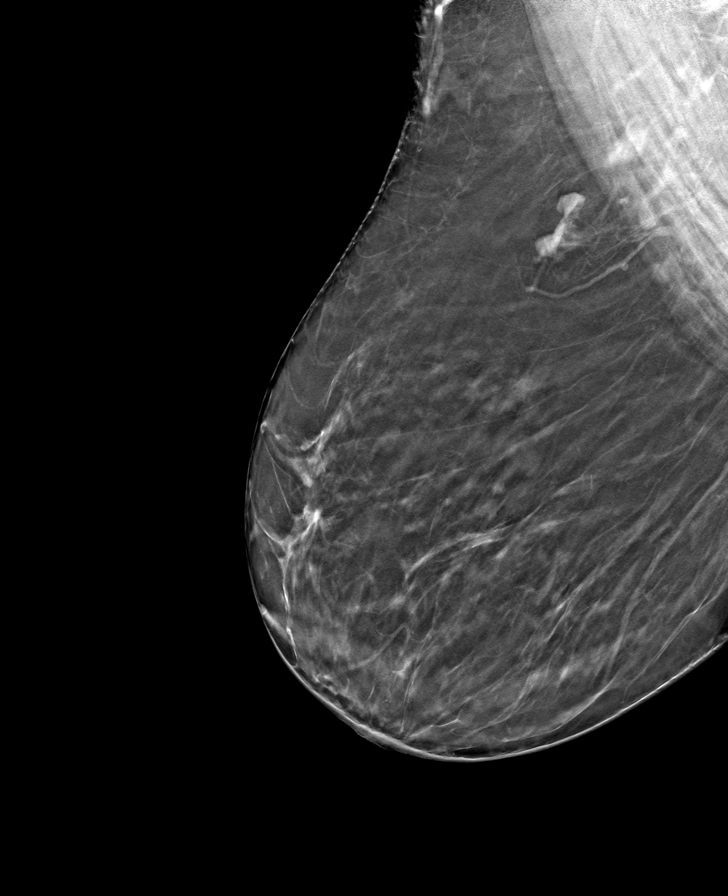

[L CC tomo · tomo slice 29/58.0]
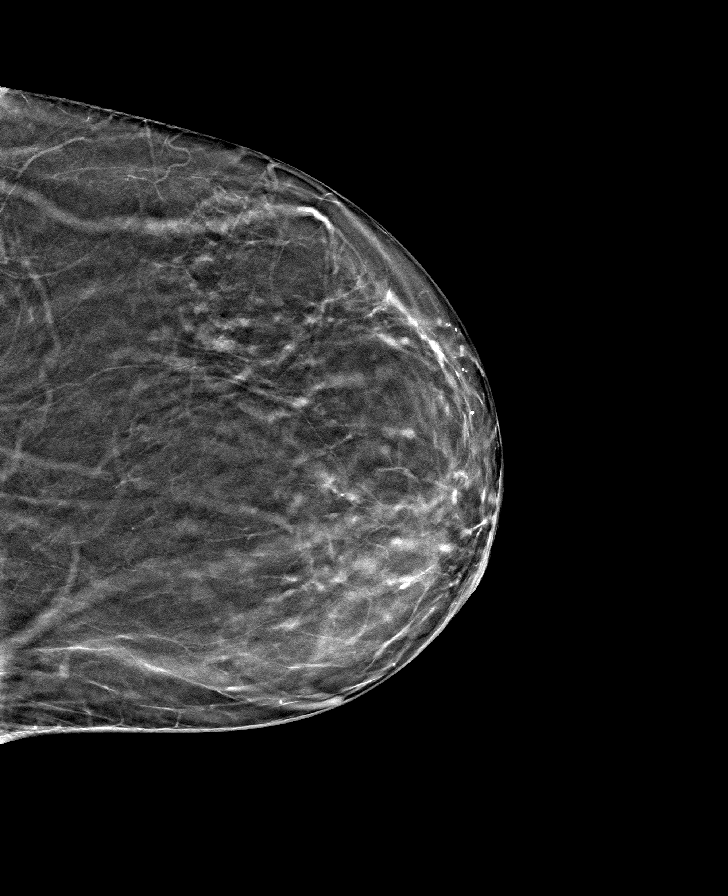

[8 of 24 positions shown; findings below may reference images not displayed]

ACR Breast Density Category b: There are scattered areas of
fibroglandular density.
FINDINGS: No suspicious mass, distortion, or microcalcifications are
identified to suggest presence of malignancy.

Mammographic images were processed with CAD.
IMPRESSION: No mammographic evidence for malignancy.

The type of discharge the patient describes is consistent with
benign changes. No further evaluation recommended.

RECOMMENDATION:
Screening mammogram in one year.(Code:[id number]-[id number])

I have discussed the findings and recommendations with the patient.
If applicable, a reminder letter will be sent to the patient
regarding the next appointment.

BI-RADS CATEGORY  1: Negative.

## 2020-11-30 ENCOUNTER — Other Ambulatory Visit: Payer: Self-pay

## 2020-11-30 ENCOUNTER — Ambulatory Visit
Admission: EM | Admit: 2020-11-30 | Discharge: 2020-11-30 | Disposition: A | Discharge status: Home / Self Care | Payer: Medicare Other | Attending: Family Medicine | Admitting: Family Medicine

## 2020-11-30 ENCOUNTER — Encounter: Payer: Self-pay | Admitting: Emergency Medicine

## 2020-11-30 DIAGNOSIS — Z20822 Contact with and (suspected) exposure to covid-19: Secondary | ICD-10-CM | POA: Diagnosis not present

## 2020-11-30 DIAGNOSIS — R509 Fever, unspecified: Secondary | ICD-10-CM | POA: Diagnosis not present

## 2020-11-30 DIAGNOSIS — R5383 Other fatigue: Secondary | ICD-10-CM | POA: Diagnosis not present

## 2020-11-30 DIAGNOSIS — J069 Acute upper respiratory infection, unspecified: Secondary | ICD-10-CM | POA: Diagnosis not present

## 2020-11-30 DIAGNOSIS — R000 Tachycardia, unspecified: Secondary | ICD-10-CM

## 2020-11-30 MED ORDER — PROMETHAZINE-DM 6.25-15 MG/5ML PO SYRP: 5.0000 mL | ORAL_SOLUTION | Freq: Four times a day (QID) | ORAL | 0 refills | Status: DC | PRN

## 2020-11-30 MED ORDER — OSELTAMIVIR PHOSPHATE 75 MG PO CAPS: 75.0000 mg | ORAL_CAPSULE | Freq: Two times a day (BID) | ORAL | 0 refills | Status: AC

## 2020-11-30 MED ORDER — IBUPROFEN 800 MG PO TABS
800.0000 mg | ORAL_TABLET | Freq: Once | ORAL | Status: AC
  Administered 2020-11-30: 800 mg via ORAL

## 2020-11-30 NOTE — ED Triage Notes (Signed)
Body aches, headache, fever, chills, runny nose since last night

## 2020-11-30 NOTE — ED Provider Notes (Signed)
[facility]   [id number] 11/30/20 Arrival Time: 8502   CC: COVID symptoms  SUBJECTIVE: History from: patient.  Katelyn Kelly is a 60 y.o. female who presents with fever, fatigue, body aches, chills, cough since last night. Denies sick exposure to COVID, flu or strep. Denies recent travel. Has history of Covid. Has  completed Covid vaccines. Has not taken OTC medications for this. SOB, fatigue, and cough are worse with activity. Denies previous symptoms in the past. Denies fever, chills, fatigue, sinus pain, rhinorrhea, sore throat, chest pain, nausea, changes in bowel or bladder habits.    ROS: As per HPI.  All other pertinent ROS negative.     Past Medical History:  Diagnosis Date  . Anxiety and depression   . Bilateral carpal tunnel syndrome   . Chronic back pain   . Chronic neck pain   . Depression   . Diabetes mellitus   . GERD (gastroesophageal reflux disease)   . H/O syncope   . Headache   . History of panic attacks   . Hypertension   . IBS (irritable bowel syndrome)   . Manic depression ([place])   . Migraine   . Panic attack    Past Surgical History:  Procedure Laterality Date  . ABDOMINAL HYSTERECTOMY    . ABDOMINAL SURGERY    . APPENDECTOMY    . BIOPSY  09/05/2017   Procedure: BIOPSY;  Surgeon: [name], MD;  [facility];  Service: Endoscopy;;  random colon  . BIOPSY  05/01/2018   Procedure: BIOPSY;  Surgeon: [name], MD;  [facility];  Service: Endoscopy;;  gastric biopsy  . CARPAL TUNNEL RELEASE Bilateral   . CHOLECYSTECTOMY    . COLONOSCOPY WITH PROPOFOL N/A 09/05/2017   Procedure: COLONOSCOPY WITH PROPOFOL;  Surgeon: [name], MD; normal TI, 5 small polyps, diverticulosis in the cecum, internal and external hemorrhoids.  Random colon biopsies benign.  Colon polyps were hyperplastic.  Repeat in 2023.  Marland Kitchen DILATION AND CURETTAGE OF UTERUS    . ESOPHAGOGASTRODUODENOSCOPY (EGD) WITH PROPOFOL N/A 05/01/2018    Procedure: ESOPHAGOGASTRODUODENOSCOPY (EGD) WITH PROPOFOL;  Surgeon: [name], MD;  normal esophagus, small hiatal hernia, mild gastritis s/p biopsy, normal duodenum.  No H. pylori.   Marland Kitchen HERNIA REPAIR    . INCISIONAL HERNIA REPAIR N/A 11/06/2020   Procedure: HERNIA REPAIR INCISIONAL, PRIMARY REPAIR;  Surgeon: Virl Cagey, MD;  Location: [facility];  Service: General;  Laterality: N/A;  . POLYPECTOMY  09/05/2017   Procedure: POLYPECTOMY;  Surgeon: [name], MD;  [facility];  Service: Endoscopy;;  sigmoid and rectal  . TUBAL LIGATION     Allergies  Allergen Reactions  . Meprobamate Hives and Swelling  . Sinequan [Doxepin Hcl] Anaphylaxis  . Albuterol Other (See Comments)    Patient does not remember the reaction. This was a record provided via [facility]  . Haloperidol Other (See Comments)    Patient does not remember the reaction. This was a record provided via [facility]  . Sertraline Other (See Comments)    Patient does not remember the reaction. This was a record provided via [facility]  . Tranxene [Clorazepate Dipotassium] Hives and Swelling  . Vortioxetine Other (See Comments)    Patient does not remember the reaction. This was a record provided via [facility]   No current facility-administered medications on file prior to encounter.   Current Outpatient Medications on File Prior to Encounter  Medication Sig Dispense Refill  . acetaminophen (TYLENOL) 325 MG tablet Take 650 mg by mouth every 6 (six) hours as needed for headache or mild pain.    Marland Kitchen ALPRAZolam (XANAX) 0.5 MG tablet Take 1 tablet (0.5 mg total) by mouth 2 (two) times daily as needed for anxiety. (Patient taking differently: Take 0.5 mg by mouth in the morning, at noon, in the evening, and at bedtime.) 60 tablet 1  . dexlansoprazole (DEXILANT) 60 MG capsule 1 PO EVERY MORNING WITH BREAKFAST. 30 capsule 11  . famotidine (PEPCID) 20 MG tablet Take 1 tablet (20 mg total) by  mouth daily. (Patient not taking: No sig reported) 30 tablet 0  . furosemide (LASIX) 20 MG tablet Take 1 tablet (20 mg total) by mouth daily for 3 days. 3 tablet 0  . insulin lispro (HUMALOG) 100 UNIT/ML KwikPen Inject 0-10 Units into the skin See admin instructions. Inject 2 units three times a day with meals. Inject additional units per sliding scale.    Marland Kitchen ketorolac (TORADOL) 10 MG tablet Take 1 tablet (10 mg total) by mouth every 6 (six) hours as needed. (Patient taking differently: Take 10 mg by mouth every 6 (six) hours as needed (migraine).) 6 tablet 0  . Menthol, Topical Analgesic, (BIOFREEZE) 10 % LIQD Apply 1 application topically daily as needed (pain).    . ondansetron (ZOFRAN-ODT) 4 MG disintegrating tablet Take 1 tablet (4 mg total) by mouth every 8 (eight) hours as needed for nausea or vomiting. 30 tablet 2  . oxyCODONE (ROXICODONE) 5 MG immediate release tablet Take 1 tablet (5 mg total) by mouth every 4 (four) hours as needed for severe pain or breakthrough pain. 10 tablet 0  . pramipexole (MIRAPEX) 0.125 MG tablet Take 0.125 mg by mouth at bedtime.     . SUMAtriptan (IMITREX) 50 MG tablet Take 1 tablet by mouth every 2 (two) hours as needed for migraine.    . topiramate (TOPAMAX) 25 MG tablet Take 25 mg by mouth 2 (two) times daily.    . TRESIBA FLEXTOUCH 100 UNIT/ML SOPN FlexTouch Pen Inject 34 Units into the skin at bedtime.     Social History   Socioeconomic History  . Marital status: Legally Separated    Spouse name: Not on file  . Number of children: Not on file  . Years of education: Not on file  . Highest education level: Not on file  Occupational History  . Not on file  Tobacco Use  . Smoking status: Current Every Day Smoker    Packs/day: 1.00    Years: 40.00    Pack years: 40.00    Types: Cigarettes  . Smokeless tobacco: Never Used  . Tobacco comment: one pack a day  Vaping Use  . Vaping Use: Never used  Substance and Sexual Activity  . Alcohol use: No  .  Drug use: No  . Sexual activity: Not Currently    Birth control/protection: Surgical  Other Topics Concern  . Not on file  Social History Narrative  . Not on file   Social Determinants of Health   Financial Resource Strain: Not on file  Food Insecurity: Not on file  Transportation Needs: Not on file  Physical Activity: Not on file  Stress: Not on file  Social Connections: Not on file  Intimate Partner Violence: Not on file   Family History  Problem Relation Age of Onset  . Diabetes Mother   . Stroke Mother   . Depression Mother   . Colon  cancer Father 36       Passed away from colon cancer  . Colon cancer Brother 56       Passed away from colon cancer  . Bipolar disorder Son     OBJECTIVE:  Vitals:   11/30/20 1347  BP: (!) 154/79  Pulse: (!) 129  Resp: 17  Temp: (!) 102.1 F (38.9 C)  TempSrc: Oral  SpO2: 95%     General appearance: alert; appears fatigued, but nontoxic; speaking in full sentences and tolerating own secretions, febrile in office today HEENT: NCAT; Ears: EACs clear, TMs pearly gray; Eyes: PERRL.  EOM grossly intact. Sinuses: nontender; Nose: nares patent with clear rhinorrhea, Throat: oropharynx erythematous, cobblestoning present, tonsils non erythematous or enlarged, uvula midline  Neck: supple without LAD Lungs: unlabored respirations, symmetrical air entry; cough: moderate; no respiratory distress; CTAB Heart: regular rate and rhythm.  Radial pulses 2+ symmetrical bilaterally Skin: warm and dry Psychological: alert and cooperative; normal mood and affect  LABS:  No results found for this or any previous visit (from the past 24 hour(s)).   ASSESSMENT & PLAN:  1. Viral URI   2. Exposure to COVID-19 virus   3. Fever, unspecified fever cause   4. Other fatigue   5. Tachycardia     Meds ordered this encounter  Medications  . ibuprofen (ADVIL) tablet 800 mg  . oseltamivir (TAMIFLU) 75 MG capsule    Sig: Take 1 capsule (75 mg total) by  mouth every 12 (twelve) hours for 5 days.    Dispense:  10 capsule    Refill:  0    Order Specific Question:   Supervising Provider    Answer:   Chase Picket [id number]  . promethazine-dextromethorphan (PROMETHAZINE-DM) 6.25-15 MG/5ML syrup    Sig: Take 5 mLs by mouth 4 (four) times daily as needed for cough.    Dispense:  118 mL    Refill:  0    Order Specific Question:   Supervising Provider    Answer:   Chase Picket [id number]   Ibuprofen in office for fever Paper rx for tamiflu if flu is positive Promethazine cough syrup prescribed Sedation precautions given Continue supportive care at home COVID and flu testing ordered.  It will take between 2-3 days for test results. Someone will contact you regarding abnormal results.   Work note provided Patient should remain in quarantine until they have received Covid results.  If negative you may resume normal activities (go back to work/school) while practicing hand hygiene, social distance, and mask wearing.  If positive, patient should remain in quarantine for at least 5 days from symptom onset AND greater than 72 hours after symptoms resolution (absence of fever without the use of fever-reducing medication and improvement in respiratory symptoms), whichever is longer Get plenty of rest and push fluids Use OTC zyrtec for nasal congestion, runny nose, and/or sore throat Use OTC flonase for nasal congestion and runny nose Use medications daily for symptom relief Use OTC medications like ibuprofen or tylenol as needed fever or pain Call or go to the ED if you have any new or worsening symptoms such as fever, worsening cough, shortness of breath, chest tightness, chest pain, turning blue, changes in mental status.  Reviewed expectations re: course of current medical issues. Questions answered. Outlined signs and symptoms indicating need for more acute intervention. Patient verbalized understanding. After Visit Summary  given.         Faustino Congress, NP 11/30/20 [phone]

## 2020-11-30 NOTE — Discharge Instructions (Addendum)
Ibuprofen given in office for fever  I have sent in cough syrup for you to take. This medication can make you sleepy. Do not drive while taking this medication.  I have given you a written prescription for tamiflu. Fill this if your flu test is positive.  Your COVID test is pending.  You should self quarantine until the test result is back.    Take Tylenol or ibuprofen as needed for fever or discomfort.  Rest and keep yourself hydrated.    Follow-up with your primary care provider if your symptoms are not improving.

## 2020-12-02 LAB — COVID-19, FLU A+B NAA
Influenza A, NAA: NOT DETECTED
Influenza B, NAA: NOT DETECTED
SARS-CoV-2, NAA: NOT DETECTED

## 2020-12-03 ENCOUNTER — Ambulatory Visit: Payer: Medicare Other | Admitting: General Surgery

## 2020-12-17 ENCOUNTER — Other Ambulatory Visit: Payer: Self-pay

## 2020-12-17 ENCOUNTER — Ambulatory Visit (INDEPENDENT_AMBULATORY_CARE_PROVIDER_SITE_OTHER): Payer: Medicare Other | Admitting: General Surgery

## 2020-12-17 ENCOUNTER — Encounter: Payer: Self-pay | Admitting: General Surgery

## 2020-12-17 VITALS — BP 128/81 | HR 93 | Temp 98.4°F | Resp 14 | Ht 61.5 in | Wt 181.0 lb

## 2020-12-17 DIAGNOSIS — K432 Incisional hernia without obstruction or gangrene: Secondary | ICD-10-CM

## 2020-12-17 NOTE — Progress Notes (Signed)
[facility] Note   HPI:  60 y.o. Female presents to clinic for post-op follow-up evaluation of her incisional hernia repair. Patient reports she is feeling better and the area is healing well.  Review of Systems:  No fevers or chills No erythema or drainage All other review of systems: otherwise negative   Vital Signs:  BP 128/81   Pulse 93   Temp 98.4 F (36.9 C) (Other (Comment))   Resp 14   Ht 5' 1.5" (1.562 m)   Wt 181 lb (82.1 kg)   SpO2 93%   BMI 33.65 kg/m    Physical Exam:  Physical Exam Vitals reviewed.  HENT:     Head: Normocephalic.  Cardiovascular:     Rate and Rhythm: Normal rate and regular rhythm.  Pulmonary:     Effort: Pulmonary effort is normal.  Abdominal:     General: There is no distension.     Palpations: Abdomen is soft.     Tenderness: There is no abdominal tenderness.     Comments: No hernia noted just medial on the prior RUQ incision from cholecystectomy     Assessment:  60 y.o. yo Female with healing area where hernia was repaired from prior cholecystectomy site doing well.  Plan:  No heavy lifting > 10 lbs, excessive bending, pushing, pulling, or squatting for 6-8 weeks after surgery.  Gradually increase activity after that time.    Curlene Labrum, MD [facility] [address] [place], [place] [address] [phone] (office)

## 2020-12-17 NOTE — Patient Instructions (Signed)
No heavy lifting > 10 lbs, excessive bending, pushing, pulling, or squatting for 6-8 weeks after surgery.  Gradually increase activity after that time.

## 2020-12-18 ENCOUNTER — Ambulatory Visit: Payer: Medicare Other | Admitting: Cardiology

## 2020-12-18 NOTE — Progress Notes (Deleted)
Clinical Summary Katelyn Kelly is a 60 y.o.female seen today as a new consult, referred by Dr Daron Offer for the following medical problems.  1. Lower extremity edema  - pcp started lasix    Past Medical History:  Diagnosis Date  . Anxiety and depression   . Bilateral carpal tunnel syndrome   . Chronic back pain   . Chronic neck pain   . Depression   . Diabetes mellitus   . GERD (gastroesophageal reflux disease)   . H/O syncope   . Headache   . History of panic attacks   . Hypertension   . IBS (irritable bowel syndrome)   . Manic depression ([place])   . Migraine   . Panic attack      Allergies  Allergen Reactions  . Meprobamate Hives and Swelling  . Sinequan [Doxepin Hcl] Anaphylaxis  . Albuterol Other (See Comments)    Patient does not remember the reaction. This was a record provided via [facility]  . Haloperidol Other (See Comments)    Patient does not remember the reaction. This was a record provided via [facility]  . Sertraline Other (See Comments)    Patient does not remember the reaction. This was a record provided via [facility]  . Tranxene [Clorazepate Dipotassium] Hives and Swelling  . Vortioxetine Other (See Comments)    Patient does not remember the reaction. This was a record provided via [facility]     Current Outpatient Medications  Medication Sig Dispense Refill  . acetaminophen (TYLENOL) 325 MG tablet Take 650 mg by mouth every 6 (six) hours as needed for headache or mild pain.    Marland Kitchen ALPRAZolam (XANAX) 0.5 MG tablet Take 1 tablet (0.5 mg total) by mouth 2 (two) times daily as needed for anxiety. (Patient taking differently: Take 0.5 mg by mouth in the morning, at noon, in the evening, and at bedtime.) 60 tablet 1  . dexlansoprazole (DEXILANT) 60 MG capsule 1 PO EVERY MORNING WITH BREAKFAST. 30 capsule 11  . famotidine (PEPCID) 20 MG tablet Take 1 tablet (20 mg total) by mouth daily. (Patient not taking: No sig reported) 30 tablet  0  . furosemide (LASIX) 20 MG tablet Take 1 tablet (20 mg total) by mouth daily for 3 days. 3 tablet 0  . insulin lispro (HUMALOG) 100 UNIT/ML KwikPen Inject 0-10 Units into the skin See admin instructions. Inject 2 units three times a day with meals. Inject additional units per sliding scale.    Marland Kitchen ketorolac (TORADOL) 10 MG tablet Take 1 tablet (10 mg total) by mouth every 6 (six) hours as needed. (Patient taking differently: Take 10 mg by mouth every 6 (six) hours as needed (migraine).) 6 tablet 0  . Menthol, Topical Analgesic, (BIOFREEZE) 10 % LIQD Apply 1 application topically daily as needed (pain).    . ondansetron (ZOFRAN-ODT) 4 MG disintegrating tablet Take 1 tablet (4 mg total) by mouth every 8 (eight) hours as needed for nausea or vomiting. 30 tablet 2  . oxyCODONE (ROXICODONE) 5 MG immediate release tablet Take 1 tablet (5 mg total) by mouth every 4 (four) hours as needed for severe pain or breakthrough pain. 10 tablet 0  . pramipexole (MIRAPEX) 0.125 MG tablet Take 0.125 mg by mouth at bedtime.     . promethazine-dextromethorphan (PROMETHAZINE-DM) 6.25-15 MG/5ML syrup Take 5 mLs by mouth 4 (four) times daily as needed for cough. 118 mL 0  . SUMAtriptan (IMITREX) 50 MG tablet Take 1 tablet by mouth every  2 (two) hours as needed for migraine.    . topiramate (TOPAMAX) 25 MG tablet Take 25 mg by mouth 2 (two) times daily.    . TRESIBA FLEXTOUCH 100 UNIT/ML SOPN FlexTouch Pen Inject 34 Units into the skin at bedtime.     No current facility-administered medications for this visit.     Past Surgical History:  Procedure Laterality Date  . ABDOMINAL HYSTERECTOMY    . ABDOMINAL SURGERY    . APPENDECTOMY    . BIOPSY  09/05/2017   Procedure: BIOPSY;  Surgeon: [name], MD;  [facility];  Service: Endoscopy;;  random colon  . BIOPSY  05/01/2018   Procedure: BIOPSY;  Surgeon: [name], MD;  [facility];  Service: Endoscopy;;  gastric biopsy  . CARPAL TUNNEL  RELEASE Bilateral   . CHOLECYSTECTOMY    . COLONOSCOPY WITH PROPOFOL N/A 09/05/2017   Procedure: COLONOSCOPY WITH PROPOFOL;  Surgeon: [name], MD; normal TI, 5 small polyps, diverticulosis in the cecum, internal and external hemorrhoids.  Random colon biopsies benign.  Colon polyps were hyperplastic.  Repeat in 2023.  Marland Kitchen DILATION AND CURETTAGE OF UTERUS    . ESOPHAGOGASTRODUODENOSCOPY (EGD) WITH PROPOFOL N/A 05/01/2018   Procedure: ESOPHAGOGASTRODUODENOSCOPY (EGD) WITH PROPOFOL;  Surgeon: [name], MD;  normal esophagus, small hiatal hernia, mild gastritis s/p biopsy, normal duodenum.  No H. pylori.   Marland Kitchen HERNIA REPAIR    . INCISIONAL HERNIA REPAIR N/A 11/06/2020   Procedure: HERNIA REPAIR INCISIONAL, PRIMARY REPAIR;  Surgeon: Virl Cagey, MD;  Location: [facility];  Service: General;  Laterality: N/A;  . POLYPECTOMY  09/05/2017   Procedure: POLYPECTOMY;  Surgeon: [name], MD;  [facility];  Service: Endoscopy;;  sigmoid and rectal  . TUBAL LIGATION       Allergies  Allergen Reactions  . Meprobamate Hives and Swelling  . Sinequan [Doxepin Hcl] Anaphylaxis  . Albuterol Other (See Comments)    Patient does not remember the reaction. This was a record provided via [facility]  . Haloperidol Other (See Comments)    Patient does not remember the reaction. This was a record provided via [facility]  . Sertraline Other (See Comments)    Patient does not remember the reaction. This was a record provided via [facility]  . Tranxene [Clorazepate Dipotassium] Hives and Swelling  . Vortioxetine Other (See Comments)    Patient does not remember the reaction. This was a record provided via [facility]      Family History  Problem Relation Age of Onset  . Diabetes Mother   . Stroke Mother   . Depression Mother   . Colon cancer Father 52       Passed away from colon cancer  . Colon cancer Brother 57       Passed away from colon cancer  .  Bipolar disorder Son      Social History Katelyn Kelly reports that she has been smoking cigarettes. She has a 40.00 pack-year smoking history. She has never used smokeless tobacco. Katelyn Kelly reports no history of alcohol use.   Review of Systems CONSTITUTIONAL: No weight loss, fever, chills, weakness or fatigue.  HEENT: Eyes: No visual loss, blurred vision, double vision or yellow sclerae.No hearing loss, sneezing, congestion, runny nose or sore throat.  SKIN: No rash or itching.  CARDIOVASCULAR:  RESPIRATORY: No shortness of breath, cough or sputum.  GASTROINTESTINAL: No anorexia, nausea, vomiting or diarrhea. No abdominal pain  or blood.  GENITOURINARY: No burning on urination, no polyuria NEUROLOGICAL: No headache, dizziness, syncope, paralysis, ataxia, numbness or tingling in the extremities. No change in bowel or bladder control.  MUSCULOSKELETAL: No muscle, back pain, joint pain or stiffness.  LYMPHATICS: No enlarged nodes. No history of splenectomy.  PSYCHIATRIC: No history of depression or anxiety.  ENDOCRINOLOGIC: No reports of sweating, cold or heat intolerance. No polyuria or polydipsia.  Marland Kitchen   Physical Examination There were no vitals filed for this visit. There were no vitals filed for this visit.  Gen: resting comfortably, no acute distress HEENT: no scleral icterus, pupils equal round and reactive, no palptable cervical adenopathy,  CV Resp: Clear to auscultation bilaterally GI: abdomen is soft, non-tender, non-distended, normal bowel sounds, no hepatosplenomegaly MSK: extremities are warm, no edema.  Skin: warm, no rash Neuro:  no focal deficits Psych: appropriate affect   Diagnostic Studies     Assessment and Plan        Arnoldo Lenis, M.D., F.A.C.C.

## 2021-06-26 ENCOUNTER — Other Ambulatory Visit: Payer: Self-pay

## 2021-06-26 ENCOUNTER — Inpatient Hospital Stay (HOSPITAL_COMMUNITY)
Admission: EM | Admit: 2021-06-26 | Discharge: 2021-06-29 | DRG: 251 | Disposition: A | Discharge status: Home / Self Care | Payer: Medicare Other | Attending: Cardiology | Admitting: Cardiology

## 2021-06-26 ENCOUNTER — Emergency Department (HOSPITAL_COMMUNITY): Payer: Medicare Other

## 2021-06-26 ENCOUNTER — Encounter (HOSPITAL_COMMUNITY): Payer: Self-pay | Admitting: Emergency Medicine

## 2021-06-26 DIAGNOSIS — Z79899 Other long term (current) drug therapy: Secondary | ICD-10-CM | POA: Diagnosis not present

## 2021-06-26 DIAGNOSIS — Z818 Family history of other mental and behavioral disorders: Secondary | ICD-10-CM | POA: Diagnosis not present

## 2021-06-26 DIAGNOSIS — E1140 Type 2 diabetes mellitus with diabetic neuropathy, unspecified: Secondary | ICD-10-CM | POA: Diagnosis not present

## 2021-06-26 DIAGNOSIS — E118 Type 2 diabetes mellitus with unspecified complications: Secondary | ICD-10-CM | POA: Diagnosis not present

## 2021-06-26 DIAGNOSIS — I25111 Atherosclerotic heart disease of native coronary artery with angina pectoris with documented spasm: Secondary | ICD-10-CM | POA: Diagnosis present

## 2021-06-26 DIAGNOSIS — G2581 Restless legs syndrome: Secondary | ICD-10-CM | POA: Diagnosis present

## 2021-06-26 DIAGNOSIS — I10 Essential (primary) hypertension: Secondary | ICD-10-CM | POA: Diagnosis present

## 2021-06-26 DIAGNOSIS — E669 Obesity, unspecified: Secondary | ICD-10-CM | POA: Diagnosis present

## 2021-06-26 DIAGNOSIS — Z720 Tobacco use: Secondary | ICD-10-CM | POA: Diagnosis not present

## 2021-06-26 DIAGNOSIS — E785 Hyperlipidemia, unspecified: Secondary | ICD-10-CM | POA: Diagnosis present

## 2021-06-26 DIAGNOSIS — Z87892 Personal history of anaphylaxis: Secondary | ICD-10-CM

## 2021-06-26 DIAGNOSIS — Z794 Long term (current) use of insulin: Secondary | ICD-10-CM | POA: Diagnosis not present

## 2021-06-26 DIAGNOSIS — M5412 Radiculopathy, cervical region: Secondary | ICD-10-CM | POA: Diagnosis present

## 2021-06-26 DIAGNOSIS — F32A Depression, unspecified: Secondary | ICD-10-CM | POA: Diagnosis present

## 2021-06-26 DIAGNOSIS — K219 Gastro-esophageal reflux disease without esophagitis: Secondary | ICD-10-CM | POA: Diagnosis present

## 2021-06-26 DIAGNOSIS — Z635 Disruption of family by separation and divorce: Secondary | ICD-10-CM

## 2021-06-26 DIAGNOSIS — Z9861 Coronary angioplasty status: Secondary | ICD-10-CM | POA: Diagnosis not present

## 2021-06-26 DIAGNOSIS — F411 Generalized anxiety disorder: Secondary | ICD-10-CM | POA: Diagnosis not present

## 2021-06-26 DIAGNOSIS — G43909 Migraine, unspecified, not intractable, without status migrainosus: Secondary | ICD-10-CM | POA: Diagnosis present

## 2021-06-26 DIAGNOSIS — E1165 Type 2 diabetes mellitus with hyperglycemia: Secondary | ICD-10-CM | POA: Diagnosis present

## 2021-06-26 DIAGNOSIS — I214 Non-ST elevation (NSTEMI) myocardial infarction: Secondary | ICD-10-CM | POA: Diagnosis present

## 2021-06-26 DIAGNOSIS — F17210 Nicotine dependence, cigarettes, uncomplicated: Secondary | ICD-10-CM | POA: Diagnosis present

## 2021-06-26 DIAGNOSIS — Z6834 Body mass index (BMI) 34.0-34.9, adult: Secondary | ICD-10-CM | POA: Diagnosis not present

## 2021-06-26 DIAGNOSIS — Z90710 Acquired absence of both cervix and uterus: Secondary | ICD-10-CM | POA: Diagnosis not present

## 2021-06-26 DIAGNOSIS — Z20822 Contact with and (suspected) exposure to covid-19: Secondary | ICD-10-CM | POA: Diagnosis present

## 2021-06-26 DIAGNOSIS — E119 Type 2 diabetes mellitus without complications: Secondary | ICD-10-CM | POA: Diagnosis not present

## 2021-06-26 DIAGNOSIS — Z888 Allergy status to other drugs, medicaments and biological substances status: Secondary | ICD-10-CM

## 2021-06-26 DIAGNOSIS — E1169 Type 2 diabetes mellitus with other specified complication: Secondary | ICD-10-CM | POA: Diagnosis present

## 2021-06-26 DIAGNOSIS — F410 Panic disorder [episodic paroxysmal anxiety] without agoraphobia: Secondary | ICD-10-CM | POA: Diagnosis present

## 2021-06-26 DIAGNOSIS — I2510 Atherosclerotic heart disease of native coronary artery without angina pectoris: Secondary | ICD-10-CM | POA: Diagnosis not present

## 2021-06-26 DIAGNOSIS — I25110 Atherosclerotic heart disease of native coronary artery with unstable angina pectoris: Secondary | ICD-10-CM | POA: Diagnosis not present

## 2021-06-26 DIAGNOSIS — Z833 Family history of diabetes mellitus: Secondary | ICD-10-CM | POA: Diagnosis not present

## 2021-06-26 DIAGNOSIS — F419 Anxiety disorder, unspecified: Secondary | ICD-10-CM | POA: Diagnosis not present

## 2021-06-26 LAB — D-DIMER, QUANTITATIVE: D-Dimer, Quant: 0.91 ug/mL-FEU — ABNORMAL HIGH (ref 0.00–0.50)

## 2021-06-26 LAB — HEPARIN LEVEL (UNFRACTIONATED)
Heparin Unfractionated: <0.1 IU/mL — ABNORMAL LOW (ref 0.30–0.70)
Heparin Unfractionated: <0.1 IU/mL — ABNORMAL LOW (ref 0.30–0.70)

## 2021-06-26 LAB — BASIC METABOLIC PANEL
Anion gap: 6 (ref 5–15)
BUN: 10 mg/dL (ref 8–23)
CO2: 21 mmol/L — ABNORMAL LOW (ref 22–32)
Calcium: 8.2 mg/dL — ABNORMAL LOW (ref 8.9–10.3)
Chloride: 110 mmol/L (ref 98–111)
Creatinine, Ser: 0.99 mg/dL (ref 0.44–1.00)
GFR, Estimated: >60 mL/min (ref >60)
Glucose, Bld: 302 mg/dL — ABNORMAL HIGH (ref 70–99)
Potassium: 4.2 mmol/L (ref 3.5–5.1)
Sodium: 137 mmol/L (ref 135–145)

## 2021-06-26 LAB — TSH: TSH: 1.518 u[IU]/mL (ref 0.350–4.500)

## 2021-06-26 LAB — CBC WITH DIFFERENTIAL/PLATELET
Abs Immature Granulocytes: 0.04 10*3/uL (ref 0.00–0.07)
Basophils Absolute: 0.1 10*3/uL (ref 0.0–0.1)
Basophils Relative: 1 %
Eosinophils Absolute: 0.2 10*3/uL (ref 0.0–0.5)
Eosinophils Relative: 2 %
HCT: 45.6 % (ref 36.0–46.0)
Hemoglobin: 15.2 g/dL — ABNORMAL HIGH (ref 12.0–15.0)
Immature Granulocytes: 0 %
Lymphocytes Relative: 22 %
Lymphs Abs: 2.3 10*3/uL (ref 0.7–4.0)
MCH: 32.3 pg (ref 26.0–34.0)
MCHC: 33.3 g/dL (ref 30.0–36.0)
MCV: 97 fL (ref 80.0–100.0)
Monocytes Absolute: 0.7 10*3/uL (ref 0.1–1.0)
Monocytes Relative: 6 %
Neutro Abs: 7.5 10*3/uL (ref 1.7–7.7)
Neutrophils Relative %: 69 %
Platelets: 251 10*3/uL (ref 150–400)
RBC: 4.7 MIL/uL (ref 3.87–5.11)
RDW: 13 % (ref 11.5–15.5)
WBC: 10.8 10*3/uL — ABNORMAL HIGH (ref 4.0–10.5)
nRBC: 0 % (ref 0.0–0.2)

## 2021-06-26 LAB — CBG MONITORING, ED: Glucose-Capillary: 302 mg/dL — ABNORMAL HIGH (ref 70–99)

## 2021-06-26 LAB — HEMOGLOBIN A1C
Hgb A1c MFr Bld: 10.4 % — ABNORMAL HIGH (ref 4.8–5.6)
Mean Plasma Glucose: 251.78 mg/dL

## 2021-06-26 LAB — GLUCOSE, CAPILLARY
Glucose-Capillary: 158 mg/dL — ABNORMAL HIGH (ref 70–99)
Glucose-Capillary: 205 mg/dL — ABNORMAL HIGH (ref 70–99)

## 2021-06-26 LAB — TROPONIN I (HIGH SENSITIVITY)
Troponin I (High Sensitivity): 722 ng/L (ref <18)
Troponin I (High Sensitivity): 4670 ng/L (ref <18)

## 2021-06-26 LAB — LIPID PANEL
Cholesterol: 193 mg/dL (ref 0–200)
HDL: 56 mg/dL (ref >40)
LDL Cholesterol: 105 mg/dL — ABNORMAL HIGH (ref 0–99)
Total CHOL/HDL Ratio: 3.4 RATIO
Triglycerides: 158 mg/dL — ABNORMAL HIGH (ref <150)
VLDL: 32 mg/dL (ref 0–40)

## 2021-06-26 LAB — BRAIN NATRIURETIC PEPTIDE: B Natriuretic Peptide: 16 pg/mL (ref 0.0–100.0)

## 2021-06-26 MED ORDER — ACETAMINOPHEN 325 MG PO TABS
650.0000 mg | ORAL_TABLET | ORAL | Status: DC | PRN
  Administered 2021-06-27 – 2021-06-28 (×3): 650 mg via ORAL
  Filled 2021-06-26 (×3): qty 2

## 2021-06-26 MED ORDER — ONDANSETRON HCL 4 MG/2ML IJ SOLN
4.0000 mg | Freq: Once | INTRAMUSCULAR | Status: AC
  Administered 2021-06-26: 4 mg via INTRAVENOUS
  Filled 2021-06-26: qty 2

## 2021-06-26 MED ORDER — NITROGLYCERIN 0.4 MG SL SUBL: 0.4000 mg | SUBLINGUAL_TABLET | SUBLINGUAL | Status: DC | PRN

## 2021-06-26 MED ORDER — ATORVASTATIN CALCIUM 80 MG PO TABS
80.0000 mg | ORAL_TABLET | Freq: Every day | ORAL | Status: DC
  Administered 2021-06-26 – 2021-06-28 (×3): 80 mg via ORAL
  Filled 2021-06-26 (×3): qty 1

## 2021-06-26 MED ORDER — HEPARIN (PORCINE) 25000 UT/250ML-% IV SOLN
1350.0000 [IU]/h | INTRAVENOUS | Status: DC
  Administered 2021-06-26: 800 [IU]/h via INTRAVENOUS
  Administered 2021-06-27: 1250 [IU]/h via INTRAVENOUS
  Administered 2021-06-28: 1350 [IU]/h via INTRAVENOUS
  Filled 2021-06-26 (×4): qty 250

## 2021-06-26 MED ORDER — MORPHINE SULFATE (PF) 4 MG/ML IV SOLN
4.0000 mg | Freq: Once | INTRAVENOUS | Status: AC
Start: 2021-06-26 — End: 2021-06-26
  Administered 2021-06-26: 4 mg via INTRAVENOUS
  Filled 2021-06-26: qty 1

## 2021-06-26 MED ORDER — KETOROLAC TROMETHAMINE 30 MG/ML IJ SOLN
30.0000 mg | Freq: Once | INTRAMUSCULAR | Status: AC
  Administered 2021-06-26: 30 mg via INTRAVENOUS
  Filled 2021-06-26: qty 1

## 2021-06-26 MED ORDER — ASPIRIN EC 81 MG PO TBEC: 81.0000 mg | DELAYED_RELEASE_TABLET | Freq: Every day | ORAL | Status: DC

## 2021-06-26 MED ORDER — SUMATRIPTAN SUCCINATE 50 MG PO TABS
50.0000 mg | ORAL_TABLET | ORAL | Status: DC | PRN
  Filled 2021-06-26: qty 1

## 2021-06-26 MED ORDER — ALPRAZOLAM 0.5 MG PO TABS
0.5000 mg | ORAL_TABLET | Freq: Three times a day (TID) | ORAL | Status: DC | PRN
Start: 2021-06-26 — End: 2021-06-26
  Filled 2021-06-26: qty 1

## 2021-06-26 MED ORDER — PRAMIPEXOLE DIHYDROCHLORIDE 0.25 MG PO TABS
0.2500 mg | ORAL_TABLET | Freq: Every day | ORAL | Status: DC
  Administered 2021-06-27 – 2021-06-28 (×2): 0.25 mg via ORAL
  Filled 2021-06-26 (×5): qty 1

## 2021-06-26 MED ORDER — IOHEXOL 350 MG/ML SOLN
75.0000 mL | Freq: Once | INTRAVENOUS | Status: AC | PRN
  Administered 2021-06-26: 75 mL via INTRAVENOUS

## 2021-06-26 MED ORDER — INSULIN ASPART 100 UNIT/ML IJ SOLN
0.0000 [IU] | Freq: Three times a day (TID) | INTRAMUSCULAR | Status: DC
  Administered 2021-06-27: 2 [IU] via SUBCUTANEOUS
  Administered 2021-06-27: 1 [IU] via SUBCUTANEOUS
  Administered 2021-06-27 – 2021-06-29 (×3): 2 [IU] via SUBCUTANEOUS
  Administered 2021-06-29: 5 [IU] via SUBCUTANEOUS

## 2021-06-26 MED ORDER — ATORVASTATIN CALCIUM 80 MG PO TABS: 80.0000 mg | ORAL_TABLET | Freq: Every day | ORAL | Status: DC

## 2021-06-26 MED ORDER — HEPARIN BOLUS VIA INFUSION
4000.0000 [IU] | Freq: Once | INTRAVENOUS | Status: AC
  Administered 2021-06-26: 4000 [IU] via INTRAVENOUS

## 2021-06-26 MED ORDER — INSULIN GLARGINE-YFGN 100 UNIT/ML ~~LOC~~ SOLN
40.0000 [IU] | Freq: Every day | SUBCUTANEOUS | Status: DC
  Administered 2021-06-26 – 2021-06-28 (×3): 40 [IU] via SUBCUTANEOUS
  Filled 2021-06-26 (×4): qty 0.4

## 2021-06-26 MED ORDER — HEPARIN BOLUS VIA INFUSION
2500.0000 [IU] | Freq: Once | INTRAVENOUS | Status: AC
  Administered 2021-06-26: 2500 [IU] via INTRAVENOUS
  Filled 2021-06-26: qty 2500

## 2021-06-26 MED ORDER — ONDANSETRON HCL 4 MG/2ML IJ SOLN: 4.0000 mg | Freq: Four times a day (QID) | INTRAMUSCULAR | Status: DC | PRN

## 2021-06-26 MED ORDER — BUSPIRONE HCL 5 MG PO TABS
7.5000 mg | ORAL_TABLET | Freq: Three times a day (TID) | ORAL | Status: DC
  Administered 2021-06-26 – 2021-06-29 (×8): 7.5 mg via ORAL
  Filled 2021-06-26 (×8): qty 2

## 2021-06-26 MED ORDER — NITROGLYCERIN 0.4 MG SL SUBL
0.4000 mg | SUBLINGUAL_TABLET | SUBLINGUAL | Status: DC | PRN
  Administered 2021-06-26: 0.4 mg via SUBLINGUAL
  Filled 2021-06-26: qty 1

## 2021-06-26 MED ORDER — INSULIN LISPRO (1 UNIT DIAL) 100 UNIT/ML (KWIKPEN): 0.0000 [IU] | PEN_INJECTOR | SUBCUTANEOUS | Status: DC

## 2021-06-26 MED ORDER — NICOTINE 21 MG/24HR TD PT24
21.0000 mg | MEDICATED_PATCH | Freq: Every day | TRANSDERMAL | Status: DC
  Administered 2021-06-26 – 2021-06-29 (×4): 21 mg via TRANSDERMAL
  Filled 2021-06-26 (×4): qty 1

## 2021-06-26 MED ORDER — HYDROMORPHONE HCL 1 MG/ML IJ SOLN: 1.0000 mg | Freq: Once | INTRAMUSCULAR | Status: DC

## 2021-06-26 MED ORDER — ALPRAZOLAM 0.5 MG PO TABS
0.5000 mg | ORAL_TABLET | Freq: Four times a day (QID) | ORAL | Status: DC | PRN
  Administered 2021-06-27 – 2021-06-29 (×7): 0.5 mg via ORAL
  Filled 2021-06-26 (×7): qty 1

## 2021-06-26 MED ORDER — INSULIN ASPART 100 UNIT/ML IJ SOLN
2.0000 [IU] | Freq: Three times a day (TID) | INTRAMUSCULAR | Status: DC
  Administered 2021-06-27 – 2021-06-29 (×6): 2 [IU] via SUBCUTANEOUS

## 2021-06-26 MED ORDER — ASPIRIN EC 81 MG PO TBEC
81.0000 mg | DELAYED_RELEASE_TABLET | Freq: Every day | ORAL | Status: DC
  Administered 2021-06-27 – 2021-06-29 (×3): 81 mg via ORAL
  Filled 2021-06-26 (×3): qty 1

## 2021-06-26 MED ORDER — METOPROLOL TARTRATE 12.5 MG HALF TABLET
12.5000 mg | ORAL_TABLET | Freq: Two times a day (BID) | ORAL | Status: DC
  Administered 2021-06-26 – 2021-06-29 (×6): 12.5 mg via ORAL
  Filled 2021-06-26 (×6): qty 1

## 2021-06-26 NOTE — Progress Notes (Signed)
ANTICOAGULATION CONSULT NOTE - Initial Consult  Pharmacy Consult for heparin gtt  Indication: chest pain/ACS  Allergies  Allergen Reactions   Meprobamate Hives and Swelling   Sinequan [Doxepin Hcl] Anaphylaxis   Albuterol Other (See Comments)    Patient does not remember the reaction. This was a record provided via [facility]   Haloperidol Other (See Comments)    Patient does not remember the reaction. This was a record provided via [facility]   Sertraline Other (See Comments)    Patient does not remember the reaction. This was a record provided via [facility]   Tranxene [Clorazepate Dipotassium] Hives and Swelling   Vortioxetine Other (See Comments)    Patient does not remember the reaction. This was a record provided via [facility]    Patient Measurements: Height: '5\' 1"'$  (154.9 cm) Weight: 82.1 kg (181 lb) IBW/kg (Calculated) : 47.8 Heparin Dosing Weight: HEPARIN DW (KG): 66.5   Vital Signs: Temp: 97.8 F (36.6 C) (08/27 1021) Temp Source: Oral (08/27 1021) BP: 130/77 (08/27 1200) Pulse Rate: 95 (08/27 1200)  Labs: Recent Labs    06/26/21 1050  HGB 15.2*  HCT 45.6  PLT 251  CREATININE 0.99    Estimated Creatinine Clearance: 57.9 mL/min (by C-G formula based on SCr of 0.99 mg/dL).   Medical History: Past Medical History:  Diagnosis Date   Anxiety and depression    Bilateral carpal tunnel syndrome    Chronic back pain    Chronic neck pain    Depression    Diabetes mellitus    GERD (gastroesophageal reflux disease)    H/O syncope    Headache    History of panic attacks    Hypertension    IBS (irritable bowel syndrome)    Manic depression ([facility])    Migraine    Panic attack     Medications:  (Not in a hospital admission)  Scheduled:   heparin  4,000 Units Intravenous Once    HYDROmorphone (DILAUDID) injection  1 mg Intravenous Once   Infusions:   heparin     PRN: nitroGLYCERIN Anti-infectives (From admission, onward)     None       Assessment: Katelyn Kelly a 60 y.o. female requires anticoagulation with a heparin iv infusion for the indication of  chest pain/ACS. Heparin gtt will be started following pharmacy protocol per pharmacy consult. Patient is not on previous oral anticoagulant that will require aPTT/HL correlation before transitioning to only HL monitoring.   Goal of Therapy:  Heparin level 0.3-0.7 units/ml Monitor platelets by anticoagulation protocol: Yes   Plan:  Give 4000 units bolus x 1 Start heparin infusion at 800 units/hr Check anti-Xa level in 6 hours and daily while on heparin Continue to monitor H&H and platelets   Katelyn Kelly 06/26/2021,12:26 PM

## 2021-06-26 NOTE — ED Triage Notes (Signed)
Pt c/o of pain that radiates from fingers to shoulder.  4 mg of morphine and 324 of ASA.

## 2021-06-26 NOTE — Progress Notes (Signed)
[facility] for heparin gtt  Indication: chest pain/ACS  Allergies  Allergen Reactions   Meprobamate Hives and Swelling   Sinequan [Doxepin Hcl] Anaphylaxis   Tranxene [Clorazepate Dipotassium] Hives and Swelling   Albuterol Other (See Comments)    Patient does not remember the reaction. This was a record provided via [facility]   Haloperidol Other (See Comments)    Patient does not remember the reaction. This was a record provided via [facility]   Sertraline Other (See Comments)    Patient does not remember the reaction. This was a record provided via [facility] Other (See Comments)    Patient does not remember the reaction. This was a record provided via [facility]    Patient Measurements: Height: '5\' 1"'$  (154.9 cm) Weight: 82.2 kg (181 lb 3.2 oz) IBW/kg (Calculated) : 47.8 Heparin Dosing Weight: HEPARIN DW (KG): 66.5   Vital Signs: Temp: 98.1 F (36.7 C) (08/27 2000) Temp Source: Oral (08/27 2000) BP: 127/81 (08/27 2000) Pulse Rate: 98 (08/27 2000)  Labs: Recent Labs    06/26/21 1050 06/26/21 2027  HGB 15.2*  --   HCT 45.6  --   PLT 251  --   HEPARINUNFRC  --  <0.10*  CREATININE 0.99  --      Estimated Creatinine Clearance: 58 mL/min (by C-G formula based on SCr of 0.99 mg/dL).   Medical History: Past Medical History:  Diagnosis Date   Anxiety and depression    Bilateral carpal tunnel syndrome    Chronic back pain    Chronic neck pain    Depression    Diabetes mellitus    GERD (gastroesophageal reflux disease)    H/O syncope    Headache    History of panic attacks    Hypertension    IBS (irritable bowel syndrome)    Manic depression (HCC)    Migraine    Panic attack     Medications:  Medications Prior to Admission  Medication Sig Dispense Refill Last Dose   acetaminophen (TYLENOL) 325 MG tablet Take 650 mg by mouth every 6 (six) hours as needed for headache or mild pain.    PRN   ALPRAZolam (XANAX) 0.5 MG tablet Take 1 tablet (0.5 mg total) by mouth 2 (two) times daily as needed for anxiety. (Patient taking differently: Take 0.5 mg by mouth in the morning, at noon, in the evening, and at bedtime.) 60 tablet 1 06/25/2021   busPIRone (BUSPAR) 7.5 MG tablet Take by mouth See admin instructions. Take 0.5 tablets (3.75 mg total) by mouth 3 times daily for 7 days, THEN 1 tablet (7.5 mg total) 3 times daily for 7 days, THEN 2 tablets (15 mg total) 3 times daily   06/25/2021   cyclobenzaprine (FLEXERIL) 5 MG tablet Take 5-10 mg by mouth 3 (three) times daily as needed for muscle spasms.   06/25/2021   insulin lispro (HUMALOG) 100 UNIT/ML KwikPen Inject 0-10 Units into the skin See admin instructions. Inject 2 units three times a day with meals. Inject additional units per sliding scale.   06/25/2021   ketorolac (TORADOL) 10 MG tablet Take 1 tablet (10 mg total) by mouth every 6 (six) hours as needed. (Patient taking differently: Take 10 mg by mouth every 6 (six) hours as needed (migraine).) 6 tablet 0 06/25/2021   Menthol, Topical Analgesic, (BIOFREEZE) 10 % LIQD Apply 1 application topically daily as needed (pain).   PRN   ondansetron (ZOFRAN-ODT) 4 MG  disintegrating tablet Take 1 tablet (4 mg total) by mouth every 8 (eight) hours as needed for nausea or vomiting. 30 tablet 2 PRN   pramipexole (MIRAPEX) 0.25 MG tablet Take 0.25 mg by mouth at bedtime.   06/25/2021   SUMAtriptan (IMITREX) 50 MG tablet Take 1 tablet by mouth every 2 (two) hours as needed for migraine.   06/25/2021   topiramate (TOPAMAX) 50 MG tablet Take 50 mg by mouth 2 (two) times daily.   06/25/2021   TRESIBA FLEXTOUCH 100 UNIT/ML SOPN FlexTouch Pen Inject 40 Units into the skin at bedtime.   06/25/2021 at 2100   Scheduled:   [START ON 06/27/2021] aspirin EC  81 mg Oral Daily   atorvastatin  80 mg Oral QHS    HYDROmorphone (DILAUDID) injection  1 mg Intravenous Once   Infusions:   heparin 800 Units/hr (06/26/21  1235)   PRN: ALPRAZolam, nitroGLYCERIN Anti-infectives (From admission, onward)    None       Assessment: 57 yoF admitted with NSTEMI started on IV heparin. No AC noted PTA, CBC wnl. Initial heparin level undetectable, no infusion issues per RN.  Goal of Therapy:  Heparin level 0.3-0.7 units/ml Monitor platelets by anticoagulation protocol: Yes   Plan:  Rebolus heparin 2500 units x1 Increase heparin infusion to 1050 units/h Recheck heparin level in 8h  Arrie Senate, PharmD, [place], [facility] Clinical Pharmacist [phone] Please check AMION for all [facility] numbers 06/26/2021

## 2021-06-26 NOTE — ED Notes (Signed)
ED Provider at bedside. 

## 2021-06-26 NOTE — H&P (Signed)
Cardiology Admission History and Physical:   Patient ID: Katelyn Kelly MRN: [id number]; DOB: 09/25/1961   Admission date: 06/26/2021  Primary Care Provider: Nickola Major, MD [facility] Cardiologist: Carlyle Dolly, MD  [facility] Electrophysiologist:  None  Chief Complaint: chest pain   Patient Profile:   Katelyn Kelly is a 60 y.o. female with DM2, MDD and GERD who presents with b/l arm pain that started 2 weeks go and worsened in severity now admitted for NSTEMI management.   History of Present Illness:   [name] had right hand pain with radiation to her arm and neck which started around 2 weeks ago.  She can reproduce these painful sensations with certain movements of her wrist/hands.  She does have a history of carpal tunnel syndrome in the past.  She woke up on 08/20 7 AM with significant bilateral arm pain and associated chest pain with radiation to the jaw.  Initially she thought that the arm pain may have been similar to prior episodes in her right arm with electric pain and tingling like her arms were asleep.  She became more worried however when she noticed fairly significant chest discomfort that was 10/10 severity and not typical of her prior symptoms.  She has not type person that normally goes to the hospital but called EMS because the pain was so severe.  She is a longtime smoker of at least 1 and more recently 2 PPD.  She has uncontrolled diabetes, obesity, HTN, and HLD.  She was transported to [facility] ED where she was diagnosed with NSTEMI and transported to [facility] for further evaluation.  She had received aspirin 324 mg p.o. x1 along with morphine prior to transport.  Her initial vital signs were unremarkable.  BP 120/66 P 91 T 36.6 RR 14 SpO2 99%  Meds received on transfer.   Heparin 4000 units (1243)  Toradol 30 mg IV (1202) Morphine 4 mg IV (1120) Zofran 4 mg IV (1120)  Heparin gtt at 800/h (1235)  SLNG 0.4 mg x1 (1203)   Morphine 4 mg  IV and ASA 324 mg PO by EMS prior to arrival   Labs sCr (0.99) hsT (722)->4670 BNP (16)  Hb (15.2), plt (251) D-dimer (0.91) BG (302)   Past Medical History:  Diagnosis Date   Anxiety and depression    Bilateral carpal tunnel syndrome    Chronic back pain    Chronic neck pain    Depression    Diabetes mellitus    GERD (gastroesophageal reflux disease)    H/O syncope    Headache    History of panic attacks    Hypertension    IBS (irritable bowel syndrome)    Manic depression ([place])    Migraine    Panic attack    Past Surgical History:  Procedure Laterality Date   ABDOMINAL HYSTERECTOMY     ABDOMINAL SURGERY     APPENDECTOMY     BIOPSY  09/05/2017   Procedure: BIOPSY;  Surgeon: [name], MD;  [facility];  Service: Endoscopy;;  random colon   BIOPSY  05/01/2018   Procedure: BIOPSY;  Surgeon: [name], MD;  [facility];  Service: Endoscopy;;  gastric biopsy   CARPAL TUNNEL RELEASE Bilateral    CHOLECYSTECTOMY     COLONOSCOPY WITH PROPOFOL N/A 09/05/2017   Procedure: COLONOSCOPY WITH PROPOFOL;  Surgeon: [name], MD; normal TI, 5 small polyps, diverticulosis in the cecum, internal and external hemorrhoids.  Random colon biopsies benign.  Colon polyps were hyperplastic.  Repeat in 2023.   DILATION AND CURETTAGE OF UTERUS     ESOPHAGOGASTRODUODENOSCOPY (EGD) WITH PROPOFOL N/A 05/01/2018   Procedure: ESOPHAGOGASTRODUODENOSCOPY (EGD) WITH PROPOFOL;  Surgeon: [name], MD;  normal esophagus, small hiatal hernia, mild gastritis s/p biopsy, normal duodenum.  No H. pylori.    HERNIA REPAIR     INCISIONAL HERNIA REPAIR N/A 11/06/2020   Procedure: HERNIA REPAIR INCISIONAL, PRIMARY REPAIR;  Surgeon: Virl Cagey, MD;  Location: [facility];  Service: General;  Laterality: N/A;   POLYPECTOMY  09/05/2017   Procedure: POLYPECTOMY;  Surgeon: [name], MD;  Location[facility];  Service: Endoscopy;;  sigmoid and rectal   TUBAL  LIGATION      Medications Prior to Admission: Prior to Admission medications   Medication Sig Start Date End Date Taking? Authorizing Provider  acetaminophen (TYLENOL) 325 MG tablet Take 650 mg by mouth every 6 (six) hours as needed for headache or mild pain.   Yes [provider]  ALPRAZolam (XANAX) 0.5 MG tablet Take 1 tablet (0.5 mg total) by mouth 2 (two) times daily as needed for anxiety. Patient taking differently: Take 0.5 mg by mouth in the morning, at noon, in the evening, and at bedtime. 04/10/20  Yes Hisada, Elie Goody, MD  busPIRone (BUSPAR) 7.5 MG tablet Take by mouth See admin instructions. Take 0.5 tablets (3.75 mg total) by mouth 3 times daily for 7 days, THEN 1 tablet (7.5 mg total) 3 times daily for 7 days, THEN 2 tablets (15 mg total) 3 times daily 06/22/21  Yes [provider]  cyclobenzaprine (FLEXERIL) 5 MG tablet Take 5-10 mg by mouth 3 (three) times daily as needed for muscle spasms. 06/02/21  Yes [provider]  insulin lispro (HUMALOG) 100 UNIT/ML KwikPen Inject 0-10 Units into the skin See admin instructions. Inject 2 units three times a day with meals. Inject additional units per sliding scale. 03/09/19  Yes [provider]  ketorolac (TORADOL) 10 MG tablet Take 1 tablet (10 mg total) by mouth every 6 (six) hours as needed. Patient taking differently: Take 10 mg by mouth every 6 (six) hours as needed (migraine). 10/26/20  Yes Walisiewicz, Kaitlyn E, PA-C  Menthol, Topical Analgesic, (BIOFREEZE) 10 % LIQD Apply 1 application topically daily as needed (pain).   Yes [provider]  ondansetron (ZOFRAN-ODT) 4 MG disintegrating tablet Take 1 tablet (4 mg total) by mouth every 8 (eight) hours as needed for nausea or vomiting. 05/28/20  Yes Erenest Rasher, PA-C  pramipexole (MIRAPEX) 0.25 MG tablet Take 0.25 mg by mouth at bedtime. 05/01/21  Yes [provider]  SUMAtriptan (IMITREX) 50 MG tablet Take 1 tablet by mouth every 2 (two)  hours as needed for migraine. 05/19/20  Yes [provider]  topiramate (TOPAMAX) 50 MG tablet Take 50 mg by mouth 2 (two) times daily. 04/03/21  Yes [provider]  TRESIBA FLEXTOUCH 100 UNIT/ML SOPN FlexTouch Pen Inject 40 Units into the skin at bedtime. 03/20/19  Yes [provider]   Allergies:    Allergies  Allergen Reactions   Meprobamate Hives and Swelling   Sinequan [Doxepin Hcl] Anaphylaxis   Tranxene [Clorazepate Dipotassium] Hives and Swelling   Albuterol Other (See Comments)    Patient does not remember the reaction. This was a record provided via [facility]   Haloperidol Other (See Comments)    Patient does not remember the reaction. This was a record  provided via [facility]   Sertraline Other (See Comments)    Patient does not remember the reaction. This was a record provided via [facility] Other (See Comments)    Patient does not remember the reaction. This was a record provided via [facility]   Social History:   Social History   Socioeconomic History   Marital status: Legally Separated    Spouse name: Not on file   Number of children: Not on file   Years of education: Not on file   Highest education level: Not on file  Occupational History   Not on file  Tobacco Use   Smoking status: Every Day    Packs/day: 1.00    Years: 40.00    Pack years: 40.00    Types: Cigarettes   Smokeless tobacco: Never   Tobacco comments:    one pack a day  Vaping Use   Vaping Use: Never used  Substance and Sexual Activity   Alcohol use: No   Drug use: No   Sexual activity: Not Currently    Birth control/protection: Surgical  Other Topics Concern   Not on file  Social History Narrative   Not on file   Social Determinants of Health   Financial Resource Strain: Not on file  Food Insecurity: Not on file  Transportation Needs: Not on file  Physical Activity: Not on file  Stress: Not on file  Social  Connections: Not on file  Intimate Partner Violence: Not on file    Family History:   The patient's family history includes Bipolar disorder in her son; Colon cancer (age of onset: 48) in her brother; Colon cancer (age of onset: 53) in her father; Depression in her mother; Diabetes in her mother; Stroke in her mother.    ROS:   Review of Systems: [y] = yes, '[ ]'$  = no      General: Weight gain '[ ]'$ ; Weight loss '[ ]'$ ; Anorexia '[ ]'$ ; Fatigue '[ ]'$ ; Fever '[ ]'$ ; Chills '[ ]'$ ; Weakness '[ ]'$    Cardiac: Chest pain/pressure [y]; Resting SOB '[ ]'$ ; Exertional SOB '[ ]'$ ; Orthopnea '[ ]'$ ; Pedal Edema '[ ]'$ ; Palpitations '[ ]'$ ; Syncope '[ ]'$ ; Presyncope '[ ]'$ ; Paroxysmal nocturnal dyspnea '[ ]'$    Pulmonary: Cough '[ ]'$ ; Wheezing '[ ]'$ ; Hemoptysis '[ ]'$ ; Sputum '[ ]'$ ; Snoring '[ ]'$    GI: Vomiting '[ ]'$ ; Dysphagia '[ ]'$ ; Melena '[ ]'$ ; Hematochezia '[ ]'$ ; Heartburn '[ ]'$ ; Abdominal pain '[ ]'$ ; Constipation '[ ]'$ ; Diarrhea '[ ]'$ ; BRBPR '[ ]'$    GU: Hematuria '[ ]'$ ; Dysuria '[ ]'$ ; Nocturia '[ ]'$  Vascular: Pain in legs with walking '[ ]'$ ; Pain in feet with lying flat '[ ]'$ ; Non-healing sores '[ ]'$ ; Stroke '[ ]'$ ; TIA '[ ]'$ ; Slurred speech '[ ]'$ ;   Neuro: Headaches '[ ]'$ ; Vertigo '[ ]'$ ; Seizures '[ ]'$ ; Paresthesias '[ ]'$ ;Blurred vision '[ ]'$ ; Diplopia '[ ]'$ ; Vision changes '[ ]'$    Ortho/Skin: Arthritis '[ ]'$ ; Joint pain '[ ]'$ ; Muscle pain '[ ]'$ ; Joint swelling '[ ]'$ ; Back Pain '[ ]'$ ; Rash '[ ]'$    Psych: Depression '[ ]'$ ; Anxiety '[ ]'$    Heme: Bleeding problems '[ ]'$ ; Clotting disorders '[ ]'$ ; Anemia '[ ]'$    Endocrine: Diabetes '[ ]'$ ; Thyroid dysfunction '[ ]'$    Physical Exam/Data:   Vitals:   06/26/21 1500 06/26/21 1740 06/26/21 2000 06/27/21 0030  BP: 126/73 (!) 110/59 127/81 113/73  Pulse: 89 (!) 108 98 84  Resp: '16 20 20 19  '$ Temp:  97.7 F (36.5 C) 98.1 F (36.7 C) 98.3 F (36.8 C)  TempSrc:  Oral Oral Oral  SpO2: 100% 98% 94% 97%  Weight:  82.2 kg    Height:  '5\' 1"'$  (1.549 m)      Intake/Output Summary (Last 24 hours) at 06/27/2021 0316 Last data filed at 06/26/2021 1855 Gross per 24 hour  Intake 310.62  ml  Output --  Net 310.62 ml   Last 3 Weights 06/26/2021 06/26/2021 12/17/2020  Weight (lbs) 181 lb 3.2 oz 181 lb 181 lb  Weight (kg) 82.192 kg 82.101 kg 82.101 kg     Body mass index is 34.24 kg/m.  General: anxious, tearful affect HEENT: normal Lymph: no adenopathy Neck: no JVD Endocrine:  No thryomegaly Vascular: No carotid bruits; FA pulses 2+ bilaterally without bruits  Cardiac:  normal S1, S2; RRR; no murmur  Lungs:  clear to auscultation bilaterally, no wheezing, rhonchi or rales  Abd: soft, nontender, no hepatomegaly  Ext: no edema Musculoskeletal:  No deformities, BUE and BLE strength normal and equal Skin: warm and dry  Neuro:  CNs 2-12 intact, no focal abnormalities noted Psych:  Normal affect   ECG  Result date: 06/26/21 (14:39)  NSR (HR 89) No ischemic changes from prior   ECG  Result date: 06/26/21 (10:19) Sinus tach (HR 100) No ischemic changes  Relevant CV Studies: None   Laboratory Data:  High Sensitivity Troponin:   Recent Labs  Lab 06/26/21 1050 06/26/21 1243  TROPONINIHS 722* 4,670*      Chemistry Recent Labs  Lab 06/26/21 1050  NA 137  K 4.2  CL 110  CO2 21*  GLUCOSE 302*  BUN 10  CREATININE 0.99  CALCIUM 8.2*  GFRNONAA >60  ANIONGAP 6    No results for input(s): PROT, ALBUMIN, AST, ALT, ALKPHOS, BILITOT in the last 168 hours. Hematology Recent Labs  Lab 06/26/21 1050  WBC 10.8*  RBC 4.70  HGB 15.2*  HCT 45.6  MCV 97.0  MCH 32.3  MCHC 33.3  RDW 13.0  PLT 251   BNP Recent Labs  Lab 06/26/21 1050  BNP 16.0    DDimer  Recent Labs  Lab 06/26/21 1050  DDIMER 0.91*   Radiology/Studies:  CT HEAD WO CONTRAST (5MM)  Result Date: 06/26/2021 CLINICAL DATA:  Acute neck pain. No red flags. Pain radiates from fingers to shoulders for 3 weeks. EXAM: CT HEAD WITHOUT CONTRAST CT CERVICAL SPINE WITHOUT CONTRAST TECHNIQUE: Multidetector CT imaging of the head and cervical spine was performed following the standard protocol  without intravenous contrast. Multiplanar CT image reconstructions of the cervical spine were also generated. COMPARISON:  July 28, 2013 FINDINGS: CT HEAD FINDINGS Brain: No evidence of acute infarction, hemorrhage, hydrocephalus, extra-axial collection or mass lesion/mass effect. Vascular: Calcified atherosclerosis in the intracranial carotids is mild. Skull: Normal. Negative for fracture or focal lesion. Sinuses/Orbits: Fluid in the posterior mastoid air cells was also present 2014 on the right but is new on the left. No bony erosion or soft tissue swelling. Middle ears are well aerated. Paranasal sinuses are otherwise unremarkable. Other: None. CT CERVICAL SPINE FINDINGS Alignment: Normal. Skull base and vertebrae: No acute fracture. No primary bone lesion or focal pathologic process. Soft tissues and spinal canal: No prevertebral fluid or swelling. No visible canal hematoma. Disc levels: Multilevel degenerative disc disease and upper facet degenerative changes, particularly on the left. Upper chest: Negative. Other: No other abnormalities. IMPRESSION: 1. Fluid in the mastoid air cells without bony erosion or overlying soft  tissue swelling is of doubtful acute significance. 2. No acute intracranial abnormalities. 3. No fracture or traumatic malalignment in the cervical spine. Degenerative changes. Electronically Signed   By: Dorise Bullion III M.D.   On: 06/26/2021 12:13   CT Angio Chest PE W and/or Wo Contrast  Result Date: 06/26/2021 CLINICAL DATA:  Pain, elevated D-dimer, low/intermediate prob PE suspected EXAM: CT ANGIOGRAPHY CHEST WITH CONTRAST TECHNIQUE: Multidetector CT imaging of the chest was performed using the standard protocol during bolus administration of intravenous contrast. Multiplanar CT image reconstructions and MIPs were obtained to evaluate the vascular anatomy. CONTRAST:  51m OMNIPAQUE IOHEXOL 350 MG/ML SOLN COMPARISON:  01/10/2012 FINDINGS: Cardiovascular: Heart size normal. No  pericardial effusion. Satisfactory opacification of pulmonary arteries noted, and there is no evidence of pulmonary emboli. Adequate contrast opacification of the thoracic aorta with no evidence of dissection, aneurysm, or stenosis. There is bovine variant brachiocephalic arch anatomy without proximal stenosis. Scattered calcified plaque in the arch, descending thoracic and visualized proximal abdominal aorta. Mediastinum/Nodes: No mass or adenopathy. Lungs/Pleura: No pleural effusion. No pneumothorax. Extensive mild ground-glass opacities involving upper lobes more than bases, with some geographic areas of sparing peripherally. No discrete nodule or airspace consolidation. Upper Abdomen: Marked heterogeneity of the hepatic parenchyma suggesting low Anterior vertebral endplate spurring at multiple levels in the mid and lower thoracic spine. fatty infiltration, although incompletely characterized on this very early arterial phase study. Cholecystectomy clips. Bilateral adrenal adenomas. No acute findings. Musculoskeletal: Mild spondylitic changes in the thoracic spine. No fracture or worrisome bone lesion. Review of the MIP images confirms the above findings. IMPRESSION: 1. Negative for acute PE or thoracic aortic dissection. 2. Mild bilateral ground-glass opacities may represent early edema or alveolitis. Electronically Signed   By: DLucrezia EuropeM.D.   On: 06/26/2021 15:08   CT Cervical Spine Wo Contrast  Result Date: 06/26/2021 CLINICAL DATA:  Acute neck pain. No red flags. Pain radiates from fingers to shoulders for 3 weeks. EXAM: CT HEAD WITHOUT CONTRAST CT CERVICAL SPINE WITHOUT CONTRAST TECHNIQUE: Multidetector CT imaging of the head and cervical spine was performed following the standard protocol without intravenous contrast. Multiplanar CT image reconstructions of the cervical spine were also generated. COMPARISON:  July 28, 2013 FINDINGS: CT HEAD FINDINGS Brain: No evidence of acute infarction,  hemorrhage, hydrocephalus, extra-axial collection or mass lesion/mass effect. Vascular: Calcified atherosclerosis in the intracranial carotids is mild. Skull: Normal. Negative for fracture or focal lesion. Sinuses/Orbits: Fluid in the posterior mastoid air cells was also present 2014 on the right but is new on the left. No bony erosion or soft tissue swelling. Middle ears are well aerated. Paranasal sinuses are otherwise unremarkable. Other: None. CT CERVICAL SPINE FINDINGS Alignment: Normal. Skull base and vertebrae: No acute fracture. No primary bone lesion or focal pathologic process. Soft tissues and spinal canal: No prevertebral fluid or swelling. No visible canal hematoma. Disc levels: Multilevel degenerative disc disease and upper facet degenerative changes, particularly on the left. Upper chest: Negative. Other: No other abnormalities. IMPRESSION: 1. Fluid in the mastoid air cells without bony erosion or overlying soft tissue swelling is of doubtful acute significance. 2. No acute intracranial abnormalities. 3. No fracture or traumatic malalignment in the cervical spine. Degenerative changes. Electronically Signed   By: DDorise BullionIII M.D.   On: 06/26/2021 12:13   DG Chest Portable 1 View  Result Date: 06/26/2021 CLINICAL DATA:  Chest pain. EXAM: PORTABLE CHEST 1 VIEW COMPARISON:  November 13, 2020. FINDINGS: The heart size and  mediastinal contours are within normal limits. Both lungs are clear. The visualized skeletal structures are unremarkable. IMPRESSION: No active disease. Electronically Signed   By: Marijo Conception M.D.   On: 06/26/2021 11:37    TIMI Risk Score for Unstable Angina or Non-ST Elevation MI:   The patient's TIMI risk score is 4, which indicates a 20% risk of all cause mortality, new or recurrent myocardial infarction or need for urgent revascularization in the next 14 days.{  Assessment and Plan:   NSTEMI  Ms. Clare presents with symptoms and significant troponin elevation  consistent with NSTEMI.  She has multiple cardiac risk factors including obesity, tobacco use, HLD, and DM2.  Although she has been having pain over the past 2 weeks it seems very different in character and quality than her presenting symptoms which are more characteristic of cardiac chest pain.  She is extremely anxious at baseline and has been on lifelong medication to try to manage this with 4 times daily benzos.  She is currently chest pain-free. - s/p ASA 324 mg PO x1, continue ASA 81 mg PO daily - start atorva 80 mg PO qhs - start metop tartrate 12.5 mg PO bid, resting HR 85-90 - continue heparin gtt - TTE ordered - lipid panel with elevated LDL (105) and TG (158) - TSH nl, A1c (10.4) - coronary angiography for Monday   HLD - start atorva 80 mg PO qhs  DM2 - hold home tresiba 40 units qhs - start semglee 40 units qhs - aspart 2 units tidac and SSI  Tobacco use - currenlty 2 ppd, at least 1 ppd for most of life - strongly encouraged cessation - nicotine patch 21 mg  Anxiety - on ativan since the 1970s - PCP trying to wean down, currnetly on ativan 0.5 mg PO qid prn, was on 1 mg PO qid prn for years, decreased ~1 year ago and then decreasd further to 0.25 mg PO qid prn few weeks ago  - resume ativan 0.5 mg PO qid prn - continue buspirone 7.5 mg PO tid (recently started)   RLS - continue pramipexole 0.25 mg PO daily   Severity of Illness: The appropriate patient status for this patient is INPATIENT. Inpatient status is judged to be reasonable and necessary in order to provide the required intensity of service to ensure the patient's safety. The patient's presenting symptoms, physical exam findings, and initial radiographic and laboratory data in the context of their chronic comorbidities is felt to place them at high risk for further clinical deterioration. Furthermore, it is not anticipated that the patient will be medically stable for discharge from the hospital within 2  midnights of admission. The following factors support the patient status of inpatient.   " The patient's presenting symptoms include chest pain  " The worrisome physical exam findings include n/a. " The initial radiographic and laboratory data are worrisome because of elevated troponins.  " The chronic co-morbidities include tobacco use, HLD, DM2  * I certify that at the point of admission it is my clinical judgment that the patient will require inpatient hospital care spanning beyond 2 midnights from the point of admission due to high intensity of service, high risk for further deterioration and high frequency of surveillance required.*   For questions or updates, please contact [facility] Please consult [email or web address] for contact info under   Signed, Dion Body, MD  06/27/2021 3:16 AM

## 2021-06-26 NOTE — ED Provider Notes (Signed)
[facility] EMERGENCY DEPARTMENT Provider Note   CSN: [id number] Arrival date & time: 06/26/21  1010     History Chief Complaint  Patient presents with   Arm Pain    Katelyn Kelly is a 60 y.o. female cervical radiculopathy and diabetes who presents the emergency department today for bilateral arm pain that has been worsening over the last 24 hours.  She states she initially had right-sided arm pain characterized as a sharp sensation rated severe in severity that started 2 weeks ago.  She was seen and evaluated by her primary care provider who was going to get imaging of her neck and arm.  However, today she was woken up from sleep due to pain in the chest and bilateral arms.  Her chest pain is diffuse, characterized as pressure, and radiates to the jaw, and bilateral arms.  She was given aspirin and morphine prior to arrival by EMS.  She reports associated nausea, leg swelling, and back pain but denies any fever, chills, diaphoresis, focal numbness/weakness, abdominal pain, urinary complaints, leg pain, and bowel changes.  She reports associated headache which she feels is different from her typical migraines.  Arm Pain Associated symptoms include chest pain, headaches and shortness of breath. Pertinent negatives include no abdominal pain.      Past Medical History:  Diagnosis Date   Anxiety and depression    Bilateral carpal tunnel syndrome    Chronic back pain    Chronic neck pain    Depression    Diabetes mellitus    GERD (gastroesophageal reflux disease)    H/O syncope    Headache    History of panic attacks    Hypertension    IBS (irritable bowel syndrome)    Manic depression ([place])    Migraine    Panic attack     Patient Active Problem List   Diagnosis Date Noted   NSTEMI (non-ST elevated myocardial infarction) ([place]) 06/26/2021   Incisional hernia, without obstruction or gangrene 10/27/2020   Early satiety 05/28/2020   Panic disorder 12/11/2019   Flank pain  12/09/2019   Diastasis recti 04/11/2019   Intermittent upper abdominal pain 03/28/2019   MDD (major depressive disorder), recurrent episode, moderate ([place]) 11/26/2018   PTSD (post-traumatic stress disorder) 11/26/2018   Nausea with vomiting 02/14/2018   GERD (gastroesophageal reflux disease) 11/08/2017   Dysphagia 11/08/2017   Taking multiple medications for chronic disease 08/02/2017   Family history of colon cancer 08/02/2017   IBS (irritable bowel syndrome) 08/02/2017   LATERAL EPICONDYLITIS 08/27/2009   JOINT PAIN, HAND 07/30/2009   TRIGGER FINGER 07/30/2009   CARPAL TUNNEL SYNDROME 03/04/2009   CERVICAL RADICULITIS 03/04/2009   NUMBNESS, HAND 02/02/2009    Past Surgical History:  Procedure Laterality Date   ABDOMINAL HYSTERECTOMY     ABDOMINAL SURGERY     APPENDECTOMY     BIOPSY  09/05/2017   Procedure: BIOPSY;  Surgeon: [name], MD;  Location: [facility];  Service: Endoscopy;;  random colon   BIOPSY  05/01/2018   Procedure: BIOPSY;  Surgeon: [name], MD;  Location: [facility];  Service: Endoscopy;;  gastric biopsy   CARPAL TUNNEL RELEASE Bilateral    CHOLECYSTECTOMY     COLONOSCOPY WITH PROPOFOL N/A 09/05/2017   Procedure: COLONOSCOPY WITH PROPOFOL;  Surgeon: [name], MD; normal TI, 5 small polyps, diverticulosis in the cecum, internal and external hemorrhoids.  Random colon biopsies benign.  Colon polyps were hyperplastic.  Repeat in 2023.  DILATION AND CURETTAGE OF UTERUS     ESOPHAGOGASTRODUODENOSCOPY (EGD) WITH PROPOFOL N/A 05/01/2018   Procedure: ESOPHAGOGASTRODUODENOSCOPY (EGD) WITH PROPOFOL;  Surgeon: [name], MD;  normal esophagus, small hiatal hernia, mild gastritis s/p biopsy, normal duodenum.  No H. pylori.    HERNIA REPAIR     INCISIONAL HERNIA REPAIR N/A 11/06/2020   Procedure: HERNIA REPAIR INCISIONAL, PRIMARY REPAIR;  Surgeon: Virl Cagey, MD;  Location: [facility];  Service: General;  Laterality: N/A;   POLYPECTOMY   09/05/2017   Procedure: POLYPECTOMY;  Surgeon: [name], MD;  Location: [facility];  Service: Endoscopy;;  sigmoid and rectal   TUBAL LIGATION       OB History     Gravida  4   Para  2   Term  2   Preterm      AB  2   Living  2      SAB  2   IAB      Ectopic      Multiple      Live Births              Family History  Problem Relation Age of Onset   Diabetes Mother    Stroke Mother    Depression Mother    Colon cancer Father 76       Passed away from colon cancer   Colon cancer Brother 4       Passed away from colon cancer   Bipolar disorder Son     Social History   Tobacco Use   Smoking status: Every Day    Packs/day: 1.00    Years: 40.00    Pack years: 40.00    Types: Cigarettes   Smokeless tobacco: Never   Tobacco comments:    one pack a day  Vaping Use   Vaping Use: Never used  Substance Use Topics   Alcohol use: No   Drug use: No    Home Medications Prior to Admission medications   Medication Sig Start Date End Date Taking? Authorizing Provider  acetaminophen (TYLENOL) 325 MG tablet Take 650 mg by mouth every 6 (six) hours as needed for headache or mild pain.    [provider]  ALPRAZolam Duanne Moron) 0.5 MG tablet Take 1 tablet (0.5 mg total) by mouth 2 (two) times daily as needed for anxiety. Patient taking differently: Take 0.5 mg by mouth in the morning, at noon, in the evening, and at bedtime. 04/10/20   [name], MD  dexlansoprazole (DEXILANT) 60 MG capsule 1 PO EVERY MORNING WITH BREAKFAST. 05/28/20   Erenest Rasher, PA-C  famotidine (PEPCID) 20 MG tablet Take 1 tablet (20 mg total) by mouth daily. Patient not taking: No sig reported 10/08/20   Nuala Alpha A, PA-C  furosemide (LASIX) 20 MG tablet Take 1 tablet (20 mg total) by mouth daily for 3 days. 11/14/20 11/17/20  Long, Wonda Olds, MD  insulin lispro (HUMALOG) 100 UNIT/ML KwikPen Inject 0-10 Units into the skin See admin instructions. Inject 2 units three  times a day with meals. Inject additional units per sliding scale. 03/09/19   [provider]  ketorolac (TORADOL) 10 MG tablet Take 1 tablet (10 mg total) by mouth every 6 (six) hours as needed. Patient taking differently: Take 10 mg by mouth every 6 (six) hours as needed (migraine). 10/26/20   Walisiewicz, Verline Lema E, PA-C  Menthol, Topical Analgesic, (BIOFREEZE) 10 % LIQD Apply 1 application topically daily as needed (pain).  [provider]  ondansetron (ZOFRAN-ODT) 4 MG disintegrating tablet Take 1 tablet (4 mg total) by mouth every 8 (eight) hours as needed for nausea or vomiting. 05/28/20   Erenest Rasher, PA-C  oxyCODONE (ROXICODONE) 5 MG immediate release tablet Take 1 tablet (5 mg total) by mouth every 4 (four) hours as needed for severe pain or breakthrough pain. 11/06/20 11/06/21  Virl Cagey, MD  pramipexole (MIRAPEX) 0.125 MG tablet Take 0.125 mg by mouth at bedtime.  05/28/18   [provider]  promethazine-dextromethorphan (PROMETHAZINE-DM) 6.25-15 MG/5ML syrup Take 5 mLs by mouth 4 (four) times daily as needed for cough. 11/30/20   Faustino Congress, NP  SUMAtriptan (IMITREX) 50 MG tablet Take 1 tablet by mouth every 2 (two) hours as needed for migraine. 05/19/20   [provider]  topiramate (TOPAMAX) 25 MG tablet Take 25 mg by mouth 2 (two) times daily. 05/18/20   [provider]  TRESIBA FLEXTOUCH 100 UNIT/ML SOPN FlexTouch Pen Inject 34 Units into the skin at bedtime. 03/20/19   [provider]    Allergies    Meprobamate, Sinequan [doxepin hcl], Albuterol, Haloperidol, Sertraline, Tranxene [clorazepate dipotassium], and Vortioxetine  Review of Systems   Review of Systems  Constitutional:  Negative for chills and fever.  Respiratory:  Positive for shortness of breath.   Cardiovascular:  Positive for chest pain.  Gastrointestinal:  Positive for nausea. Negative for abdominal pain, diarrhea and vomiting.  Genitourinary:   Negative for dysuria and hematuria.  Musculoskeletal:  Positive for back pain and neck pain.       Bilateral arm pain.  Neurological:  Positive for numbness and headaches. Negative for dizziness, syncope and weakness.  All other systems reviewed and are negative.  Physical Exam Updated Vital Signs BP 120/66   Pulse 91   Temp 97.8 F (36.6 C) (Oral)   Resp 14   Ht '5\' 1"'$  (1.549 m)   Wt 82.1 kg   SpO2 99%   BMI 34.20 kg/m   Physical Exam Vitals reviewed.  Constitutional:      Appearance: Normal appearance.     Comments: Tearful and appears in pain.  HENT:     Head: Normocephalic and atraumatic.  Eyes:     General: Lids are normal. No scleral icterus.    Extraocular Movements: Extraocular movements intact.     Right eye: No nystagmus.     Left eye: No nystagmus.     Conjunctiva/sclera: Conjunctivae normal.     Pupils: Pupils are equal, round, and reactive to light.  Neck:     Comments: Neck is supple.  Full range of motion without pain.  No cervical midline tenderness. Cardiovascular:     Pulses:          Radial pulses are 2+ on the right side and 2+ on the left side.       Dorsalis pedis pulses are 2+ on the right side and 2+ on the left side.     Comments: She is tachycardic.  Regular rhythm.  No murmurs, rubs, or gallops heard.  1+ pitting edema bilaterally to the lower extremities.  Chest wall is nontender to palpation. Pulmonary:     Comments: Normal effort.  Breath sounds are normal.  No adventitious sounds. Abdominal:     General: Abdomen is flat. Bowel sounds are normal.     Palpations: Abdomen is soft.     Tenderness: There is no abdominal tenderness.  Skin:    General: Skin is warm and  dry.  Neurological:     Mental Status: She is alert.     Comments: Cranial nerves II through XII intact.  Patient has normal and symmetrical sensation to the upper and lower extremities.  Full range of motion to the upper and lower extremities.  5/5 strength to the upper and  lower extremities.  Negative pronator drift.  No obvious tremors or atrophy to the upper and lower extremities.  The  Psychiatric:        Mood and Affect: Mood normal.    ED Results / Procedures / Treatments   Labs (all labs ordered are listed, but only abnormal results are displayed) Labs Reviewed  BASIC METABOLIC PANEL - Abnormal; Notable for the following components:      Result Value   CO2 21 (*)    Glucose, Bld 302 (*)    Calcium 8.2 (*)    All other components within normal limits  CBC WITH DIFFERENTIAL/PLATELET - Abnormal; Notable for the following components:   WBC 10.8 (*)    Hemoglobin 15.2 (*)    All other components within normal limits  D-DIMER, QUANTITATIVE - Abnormal; Notable for the following components:   D-Dimer, Quant 0.91 (*)    All other components within normal limits  CBG MONITORING, ED - Abnormal; Notable for the following components:   Glucose-Capillary 302 (*)    All other components within normal limits  TROPONIN I (HIGH SENSITIVITY) - Abnormal; Notable for the following components:   Troponin I (High Sensitivity) 722 (*)    All other components within normal limits  TROPONIN I (HIGH SENSITIVITY) - Abnormal; Notable for the following components:   Troponin I (High Sensitivity) 4,670 (*)    All other components within normal limits  BRAIN NATRIURETIC PEPTIDE  HEPARIN LEVEL (UNFRACTIONATED)    EKG EKG Interpretation  Date/Time:  Saturday June 26 2021 14:39:16 EDT Ventricular Rate:  89 PR Interval:  180 QRS Duration: 100 QT Interval:  398 QTC Calculation: 485 R Axis:   63 Text Interpretation: Sinus rhythm Right atrial enlargement Low voltage, extremity and precordial leads Confirmed by Nanda Quinton [phone]) on 06/26/2021 3:00:51 PM  Radiology CT HEAD WO CONTRAST (5MM)  Result Date: 06/26/2021 CLINICAL DATA:  Acute neck pain. No red flags. Pain radiates from fingers to shoulders for 3 weeks. EXAM: CT HEAD WITHOUT CONTRAST CT CERVICAL SPINE  WITHOUT CONTRAST TECHNIQUE: Multidetector CT imaging of the head and cervical spine was performed following the standard protocol without intravenous contrast. Multiplanar CT image reconstructions of the cervical spine were also generated. COMPARISON:  July 28, 2013 FINDINGS: CT HEAD FINDINGS Brain: No evidence of acute infarction, hemorrhage, hydrocephalus, extra-axial collection or mass lesion/mass effect. Vascular: Calcified atherosclerosis in the intracranial carotids is mild. Skull: Normal. Negative for fracture or focal lesion. Sinuses/Orbits: Fluid in the posterior mastoid air cells was also present 2014 on the right but is new on the left. No bony erosion or soft tissue swelling. Middle ears are well aerated. Paranasal sinuses are otherwise unremarkable. Other: None. CT CERVICAL SPINE FINDINGS Alignment: Normal. Skull base and vertebrae: No acute fracture. No primary bone lesion or focal pathologic process. Soft tissues and spinal canal: No prevertebral fluid or swelling. No visible canal hematoma. Disc levels: Multilevel degenerative disc disease and upper facet degenerative changes, particularly on the left. Upper chest: Negative. Other: No other abnormalities. IMPRESSION: 1. Fluid in the mastoid air cells without bony erosion or overlying soft tissue swelling is of doubtful acute significance. 2. No acute intracranial abnormalities. 3.  No fracture or traumatic malalignment in the cervical spine. Degenerative changes. Electronically Signed   By: Dorise Bullion III M.D.   On: 06/26/2021 12:13   CT Angio Chest PE W and/or Wo Contrast  Result Date: 06/26/2021 CLINICAL DATA:  Pain, elevated D-dimer, low/intermediate prob PE suspected EXAM: CT ANGIOGRAPHY CHEST WITH CONTRAST TECHNIQUE: Multidetector CT imaging of the chest was performed using the standard protocol during bolus administration of intravenous contrast. Multiplanar CT image reconstructions and MIPs were obtained to evaluate the vascular  anatomy. CONTRAST:  1m OMNIPAQUE IOHEXOL 350 MG/ML SOLN COMPARISON:  01/10/2012 FINDINGS: Cardiovascular: Heart size normal. No pericardial effusion. Satisfactory opacification of pulmonary arteries noted, and there is no evidence of pulmonary emboli. Adequate contrast opacification of the thoracic aorta with no evidence of dissection, aneurysm, or stenosis. There is bovine variant brachiocephalic arch anatomy without proximal stenosis. Scattered calcified plaque in the arch, descending thoracic and visualized proximal abdominal aorta. Mediastinum/Nodes: No mass or adenopathy. Lungs/Pleura: No pleural effusion. No pneumothorax. Extensive mild ground-glass opacities involving upper lobes more than bases, with some geographic areas of sparing peripherally. No discrete nodule or airspace consolidation. Upper Abdomen: Marked heterogeneity of the hepatic parenchyma suggesting low Anterior vertebral endplate spurring at multiple levels in the mid and lower thoracic spine. fatty infiltration, although incompletely characterized on this very early arterial phase study. Cholecystectomy clips. Bilateral adrenal adenomas. No acute findings. Musculoskeletal: Mild spondylitic changes in the thoracic spine. No fracture or worrisome bone lesion. Review of the MIP images confirms the above findings. IMPRESSION: 1. Negative for acute PE or thoracic aortic dissection. 2. Mild bilateral ground-glass opacities may represent early edema or alveolitis. Electronically Signed   By: DLucrezia EuropeM.D.   On: 06/26/2021 15:08   CT Cervical Spine Wo Contrast  Result Date: 06/26/2021 CLINICAL DATA:  Acute neck pain. No red flags. Pain radiates from fingers to shoulders for 3 weeks. EXAM: CT HEAD WITHOUT CONTRAST CT CERVICAL SPINE WITHOUT CONTRAST TECHNIQUE: Multidetector CT imaging of the head and cervical spine was performed following the standard protocol without intravenous contrast. Multiplanar CT image reconstructions of the cervical  spine were also generated. COMPARISON:  July 28, 2013 FINDINGS: CT HEAD FINDINGS Brain: No evidence of acute infarction, hemorrhage, hydrocephalus, extra-axial collection or mass lesion/mass effect. Vascular: Calcified atherosclerosis in the intracranial carotids is mild. Skull: Normal. Negative for fracture or focal lesion. Sinuses/Orbits: Fluid in the posterior mastoid air cells was also present 2014 on the right but is new on the left. No bony erosion or soft tissue swelling. Middle ears are well aerated. Paranasal sinuses are otherwise unremarkable. Other: None. CT CERVICAL SPINE FINDINGS Alignment: Normal. Skull base and vertebrae: No acute fracture. No primary bone lesion or focal pathologic process. Soft tissues and spinal canal: No prevertebral fluid or swelling. No visible canal hematoma. Disc levels: Multilevel degenerative disc disease and upper facet degenerative changes, particularly on the left. Upper chest: Negative. Other: No other abnormalities. IMPRESSION: 1. Fluid in the mastoid air cells without bony erosion or overlying soft tissue swelling is of doubtful acute significance. 2. No acute intracranial abnormalities. 3. No fracture or traumatic malalignment in the cervical spine. Degenerative changes. Electronically Signed   By: DDorise BullionIII M.D.   On: 06/26/2021 12:13   DG Chest Portable 1 View  Result Date: 06/26/2021 CLINICAL DATA:  Chest pain. EXAM: PORTABLE CHEST 1 VIEW COMPARISON:  November 13, 2020. FINDINGS: The heart size and mediastinal contours are within normal limits. Both lungs are clear. The visualized skeletal  structures are unremarkable. IMPRESSION: No active disease. Electronically Signed   By: Marijo Conception M.D.   On: 06/26/2021 11:37    Procedures .Critical Care  Date/Time: 06/26/2021 3:18 PM Performed by: Hendricks Limes, PA-C Authorized by: Hendricks Limes, PA-C   Critical care provider statement:    Critical care time (minutes):  45   Critical  care time was exclusive of:  Separately billable procedures and treating other patients   Critical care was necessary to treat or prevent imminent or life-threatening deterioration of the following conditions:  Circulatory failure   Critical care was time spent personally by me on the following activities:  Discussions with consultants, evaluation of patient's response to treatment, examination of patient, ordering and performing treatments and interventions, ordering and review of laboratory studies, ordering and review of radiographic studies, pulse oximetry, re-evaluation of patient's condition, obtaining history from patient or surrogate and review of old charts   I assumed direction of critical care for this patient from another provider in my specialty: no     Care discussed with: admitting provider     Medications Ordered in ED Medications  nitroGLYCERIN (NITROSTAT) SL tablet 0.4 mg (0.4 mg Sublingual Given 06/26/21 1203)  HYDROmorphone (DILAUDID) injection 1 mg (0 mg Intravenous Hold 06/26/21 1301)  heparin ADULT infusion 100 units/mL (25000 units/250m) (800 Units/hr Intravenous New Bag/Given 06/26/21 1235)  morphine 4 MG/ML injection 4 mg (4 mg Intravenous Given 06/26/21 1120)  ondansetron (ZOFRAN) injection 4 mg (4 mg Intravenous Given 06/26/21 1120)  ketorolac (TORADOL) 30 MG/ML injection 30 mg (30 mg Intravenous Given 06/26/21 1202)  heparin bolus via infusion 4,000 Units (4,000 Units Intravenous Bolus from Bag 06/26/21 1243)  iohexol (OMNIPAQUE) 350 MG/ML injection 75 mL (75 mLs Intravenous Contrast Given 06/26/21 1410)    ED Course  I have reviewed the triage vital signs and the nursing notes.  Pertinent labs & imaging results that were available during my care of the patient were reviewed by me and considered in my medical decision making (see chart for details).  Clinical Course as of 06/26/21 1519  Sat Jun 26, 2021  1104 Discussed case with attending Dr. LLaverta Baltimore Evaluated patient at  bedside. He agrees with plan.  [CF]  1300 Patient is currently pain free after nitroglycerin.  [CF]  1319 Moderate risk for cardiac event following heart score. [CF]  1355 Troponin I (High Sensitivity)(!!): 4,670 [CF]    Clinical Course User Index [CF] FHendricks Limes PA-C   MDM Rules/Calculators/A&P                         PTerryl Creekmoreis a 60year old female with history of diabetes, hypertension, and cervical radiculopathy who presents to the emergency department today for further evaluation of bilateral arm pain and chest pain. There seems to be an overlapping story as the right arm pain seems radicular in nature. However, this good be an anginal equivalent. The chest pain starting this morning is concerning for ACS. I doubt thoracic dissection. Neurologic exam was reassuring for any stroke. No history of head trauma. Doubt ICH.   CBC reveals mild leukocytosis.  BMP revealed hyperglycemia. Initial 722 subsequent troponin 4670.  This supports suspicion for NSTEMI.  BNP was negative.  CHF less likely.  D-dimer was slightly elevated.  We will further evaluate with CT angio. CT angio was negative for thoracic dissection and PE. CT imaging of the head and cervical spine reveals no abnormalities.  Acute radiculopathy  less likely.  Doubt significant cord compression.  No obvious fracture or misalignment of the cervical vertebrae.  Chest x-ray was negative.  Acute pulmonary pathology is less likely.   She is improving and pain-free after nitroglycerin. She is not toxic or shock appearing. Given the clinical picture NSTEMI is very likely. Heparin infusion starter with pharmacy recs. She is currently hemodynamically stable and safe for admission. Cardiology was consulted and appropriate recs were ordered. She will be admitted for further evaluation.  Final Clinical Impression(s) / ED Diagnoses Final diagnoses:  NSTEMI (non-ST elevated myocardial infarction) [facility])    Rx / [place] Orders ED Discharge  Orders     None        Hendricks Limes, [place] 06/26/21 1523    Margette Fast, MD 06/27/21 [phone]

## 2021-06-27 ENCOUNTER — Inpatient Hospital Stay (HOSPITAL_COMMUNITY): Payer: Medicare Other

## 2021-06-27 DIAGNOSIS — I214 Non-ST elevation (NSTEMI) myocardial infarction: Secondary | ICD-10-CM

## 2021-06-27 DIAGNOSIS — Z720 Tobacco use: Secondary | ICD-10-CM | POA: Diagnosis not present

## 2021-06-27 DIAGNOSIS — E1140 Type 2 diabetes mellitus with diabetic neuropathy, unspecified: Secondary | ICD-10-CM | POA: Diagnosis not present

## 2021-06-27 DIAGNOSIS — E785 Hyperlipidemia, unspecified: Secondary | ICD-10-CM | POA: Diagnosis not present

## 2021-06-27 DIAGNOSIS — Z794 Long term (current) use of insulin: Secondary | ICD-10-CM

## 2021-06-27 LAB — CBC
HCT: 42.5 % (ref 36.0–46.0)
Hemoglobin: 14.1 g/dL (ref 12.0–15.0)
MCH: 31.9 pg (ref 26.0–34.0)
MCHC: 33.2 g/dL (ref 30.0–36.0)
MCV: 96.2 fL (ref 80.0–100.0)
Platelets: 233 10*3/uL (ref 150–400)
RBC: 4.42 MIL/uL (ref 3.87–5.11)
RDW: 13 % (ref 11.5–15.5)
WBC: 10.1 10*3/uL (ref 4.0–10.5)
nRBC: 0 % (ref 0.0–0.2)

## 2021-06-27 LAB — GLUCOSE, CAPILLARY
Glucose-Capillary: 199 mg/dL — ABNORMAL HIGH (ref 70–99)
Glucose-Capillary: 196 mg/dL — ABNORMAL HIGH (ref 70–99)
Glucose-Capillary: 131 mg/dL — ABNORMAL HIGH (ref 70–99)
Glucose-Capillary: 124 mg/dL — ABNORMAL HIGH (ref 70–99)
Glucose-Capillary: 116 mg/dL — ABNORMAL HIGH (ref 70–99)

## 2021-06-27 LAB — HEPARIN LEVEL (UNFRACTIONATED)
Heparin Unfractionated: 0.21 IU/mL — ABNORMAL LOW (ref 0.30–0.70)
Heparin Unfractionated: 0.42 IU/mL (ref 0.30–0.70)

## 2021-06-27 LAB — SARS CORONAVIRUS 2 (TAT 6-24 HRS): SARS Coronavirus 2: NEGATIVE

## 2021-06-27 LAB — ECHOCARDIOGRAM COMPLETE
Area-P 1/2: 4.06 cm2
Height: 61 in
S' Lateral: 3 cm
Weight: 2910.07 oz

## 2021-06-27 LAB — HIV ANTIBODY (ROUTINE TESTING W REFLEX): HIV Screen 4th Generation wRfx: NONREACTIVE

## 2021-06-27 MED ORDER — HEPARIN BOLUS VIA INFUSION
2000.0000 [IU] | Freq: Once | INTRAVENOUS | Status: AC
  Administered 2021-06-27: 2000 [IU] via INTRAVENOUS
  Filled 2021-06-27: qty 2000

## 2021-06-27 MED ORDER — SODIUM CHLORIDE 0.9% FLUSH
3.0000 mL | Freq: Two times a day (BID) | INTRAVENOUS | Status: DC
  Administered 2021-06-27 – 2021-06-28 (×3): 3 mL via INTRAVENOUS

## 2021-06-27 MED ORDER — SODIUM CHLORIDE 0.9 % WEIGHT BASED INFUSION
1.0000 mL/kg/h | INTRAVENOUS | Status: DC
  Administered 2021-06-28: 1 mL/kg/h via INTRAVENOUS

## 2021-06-27 MED ORDER — SODIUM CHLORIDE 0.9 % WEIGHT BASED INFUSION
3.0000 mL/kg/h | INTRAVENOUS | Status: AC
  Administered 2021-06-28: 3 mL/kg/h via INTRAVENOUS

## 2021-06-27 MED ORDER — SODIUM CHLORIDE 0.9 % IV SOLN: 250.0000 mL | INTRAVENOUS | Status: DC | PRN

## 2021-06-27 MED ORDER — SODIUM CHLORIDE 0.9% FLUSH: 3.0000 mL | INTRAVENOUS | Status: DC | PRN

## 2021-06-27 NOTE — Progress Notes (Signed)
Progress Note  Patient Name: Katelyn Kelly Date of Encounter: 06/27/2021  [facility] Cardiologist: Carlyle Dolly, MD   Subjective   Admitted overnight for NSTEMI, history reviewed. Currently chest pain free. We reviewed the results of her bloodwork and echo. She is tearful and anxious today. We discussed cath at length (see below), and she is amenable. She asked me to speak to her son Katelyn Kelly over the phone, and I discussed findings and cath with him as well. She was on aspirin previously, stopped recently by her PCP, but not due to side effects. She has tolerated aspirin well in the past.  Inpatient Medications    Scheduled Meds:  aspirin EC  81 mg Oral Daily   atorvastatin  80 mg Oral QHS   busPIRone  7.5 mg Oral TID   insulin aspart  0-9 Units Subcutaneous TID WC   insulin aspart  2 Units Subcutaneous TID WC   insulin glargine-yfgn  40 Units Subcutaneous QHS   metoprolol tartrate  12.5 mg Oral BID   nicotine  21 mg Transdermal Daily   pramipexole  0.25 mg Oral QHS   Continuous Infusions:  heparin 1,250 Units/hr (06/27/21 0709)   PRN Meds: acetaminophen, ALPRAZolam, nitroGLYCERIN, ondansetron (ZOFRAN) IV   Vital Signs    Vitals:   06/27/21 0333 06/27/21 0450 06/27/21 0840 06/27/21 0940  BP: 122/75  111/70 118/79  Pulse: 84  86 86  Resp: 18   17  Temp: 98 F (36.7 C)   98.2 F (36.8 C)  TempSrc: Oral   Oral  SpO2: 95%   95%  Weight:  82.5 kg    Height:        Intake/Output Summary (Last 24 hours) at 06/27/2021 1057 Last data filed at 06/26/2021 1855 Gross per 24 hour  Intake 310.62 ml  Output --  Net 310.62 ml   Last 3 Weights 06/27/2021 06/26/2021 06/26/2021  Weight (lbs) 181 lb 14.1 oz 181 lb 3.2 oz 181 lb  Weight (kg) 82.5 kg 82.192 kg 82.101 kg  Some encounter information is confidential and restricted. Go to Review Flowsheets activity to see all data.      Telemetry    NSR - Personally Reviewed  ECG    8/27 NSR at 85 bpm - Personally  Reviewed  Physical Exam   GEN: No acute distress.   Neck: No JVD Cardiac: RRR, no murmurs, rubs, or gallops.  Respiratory: Clear to auscultation bilaterally. GI: Soft, nontender, non-distended  MS: No edema; No deformity. Neuro:  Nonfocal  Psych: Normal affect   Labs    High Sensitivity Troponin:   Recent Labs  Lab 06/26/21 1050 06/26/21 1243  TROPONINIHS 722* 4,670*      Chemistry Recent Labs  Lab 06/26/21 1050  NA 137  K 4.2  CL 110  CO2 21*  GLUCOSE 302*  BUN 10  CREATININE 0.99  CALCIUM 8.2*  GFRNONAA >60  ANIONGAP 6     Hematology Recent Labs  Lab 06/26/21 1050  WBC 10.8*  RBC 4.70  HGB 15.2*  HCT 45.6  MCV 97.0  MCH 32.3  MCHC 33.3  RDW 13.0  PLT 251    BNP Recent Labs  Lab 06/26/21 1050  BNP 16.0     DDimer  Recent Labs  Lab 06/26/21 1050  DDIMER 0.91*     Radiology    CT HEAD WO CONTRAST (5MM)  Result Date: 06/26/2021 CLINICAL DATA:  Acute neck pain. No red flags. Pain radiates from fingers to shoulders for  3 weeks. EXAM: CT HEAD WITHOUT CONTRAST CT CERVICAL SPINE WITHOUT CONTRAST TECHNIQUE: Multidetector CT imaging of the head and cervical spine was performed following the standard protocol without intravenous contrast. Multiplanar CT image reconstructions of the cervical spine were also generated. COMPARISON:  July 28, 2013 FINDINGS: CT HEAD FINDINGS Brain: No evidence of acute infarction, hemorrhage, hydrocephalus, extra-axial collection or mass lesion/mass effect. Vascular: Calcified atherosclerosis in the intracranial carotids is mild. Skull: Normal. Negative for fracture or focal lesion. Sinuses/Orbits: Fluid in the posterior mastoid air cells was also present 2014 on the right but is new on the left. No bony erosion or soft tissue swelling. Middle ears are well aerated. Paranasal sinuses are otherwise unremarkable. Other: None. CT CERVICAL SPINE FINDINGS Alignment: Normal. Skull base and vertebrae: No acute fracture. No  primary bone lesion or focal pathologic process. Soft tissues and spinal canal: No prevertebral fluid or swelling. No visible canal hematoma. Disc levels: Multilevel degenerative disc disease and upper facet degenerative changes, particularly on the left. Upper chest: Negative. Other: No other abnormalities. IMPRESSION: 1. Fluid in the mastoid air cells without bony erosion or overlying soft tissue swelling is of doubtful acute significance. 2. No acute intracranial abnormalities. 3. No fracture or traumatic malalignment in the cervical spine. Degenerative changes. Electronically Signed   By: Dorise Bullion III M.D.   On: 06/26/2021 12:13   CT Angio Chest PE W and/or Wo Contrast  Result Date: 06/26/2021 CLINICAL DATA:  Pain, elevated D-dimer, low/intermediate prob PE suspected EXAM: CT ANGIOGRAPHY CHEST WITH CONTRAST TECHNIQUE: Multidetector CT imaging of the chest was performed using the standard protocol during bolus administration of intravenous contrast. Multiplanar CT image reconstructions and MIPs were obtained to evaluate the vascular anatomy. CONTRAST:  79m OMNIPAQUE IOHEXOL 350 MG/ML SOLN COMPARISON:  01/10/2012 FINDINGS: Cardiovascular: Heart size normal. No pericardial effusion. Satisfactory opacification of pulmonary arteries noted, and there is no evidence of pulmonary emboli. Adequate contrast opacification of the thoracic aorta with no evidence of dissection, aneurysm, or stenosis. There is bovine variant brachiocephalic arch anatomy without proximal stenosis. Scattered calcified plaque in the arch, descending thoracic and visualized proximal abdominal aorta. Mediastinum/Nodes: No mass or adenopathy. Lungs/Pleura: No pleural effusion. No pneumothorax. Extensive mild ground-glass opacities involving upper lobes more than bases, with some geographic areas of sparing peripherally. No discrete nodule or airspace consolidation. Upper Abdomen: Marked heterogeneity of the hepatic parenchyma suggesting  low Anterior vertebral endplate spurring at multiple levels in the mid and lower thoracic spine. fatty infiltration, although incompletely characterized on this very early arterial phase study. Cholecystectomy clips. Bilateral adrenal adenomas. No acute findings. Musculoskeletal: Mild spondylitic changes in the thoracic spine. No fracture or worrisome bone lesion. Review of the MIP images confirms the above findings. IMPRESSION: 1. Negative for acute PE or thoracic aortic dissection. 2. Mild bilateral ground-glass opacities may represent early edema or alveolitis. Electronically Signed   By: DLucrezia EuropeM.D.   On: 06/26/2021 15:08   CT Cervical Spine Wo Contrast  Result Date: 06/26/2021 CLINICAL DATA:  Acute neck pain. No red flags. Pain radiates from fingers to shoulders for 3 weeks. EXAM: CT HEAD WITHOUT CONTRAST CT CERVICAL SPINE WITHOUT CONTRAST TECHNIQUE: Multidetector CT imaging of the head and cervical spine was performed following the standard protocol without intravenous contrast. Multiplanar CT image reconstructions of the cervical spine were also generated. COMPARISON:  July 28, 2013 FINDINGS: CT HEAD FINDINGS Brain: No evidence of acute infarction, hemorrhage, hydrocephalus, extra-axial collection or mass lesion/mass effect. Vascular: Calcified atherosclerosis in the  intracranial carotids is mild. Skull: Normal. Negative for fracture or focal lesion. Sinuses/Orbits: Fluid in the posterior mastoid air cells was also present 2014 on the right but is new on the left. No bony erosion or soft tissue swelling. Middle ears are well aerated. Paranasal sinuses are otherwise unremarkable. Other: None. CT CERVICAL SPINE FINDINGS Alignment: Normal. Skull base and vertebrae: No acute fracture. No primary bone lesion or focal pathologic process. Soft tissues and spinal canal: No prevertebral fluid or swelling. No visible canal hematoma. Disc levels: Multilevel degenerative disc disease and upper facet  degenerative changes, particularly on the left. Upper chest: Negative. Other: No other abnormalities. IMPRESSION: 1. Fluid in the mastoid air cells without bony erosion or overlying soft tissue swelling is of doubtful acute significance. 2. No acute intracranial abnormalities. 3. No fracture or traumatic malalignment in the cervical spine. Degenerative changes. Electronically Signed   By: Dorise Bullion III M.D.   On: 06/26/2021 12:13   DG Chest Portable 1 View  Result Date: 06/26/2021 CLINICAL DATA:  Chest pain. EXAM: PORTABLE CHEST 1 VIEW COMPARISON:  November 13, 2020. FINDINGS: The heart size and mediastinal contours are within normal limits. Both lungs are clear. The visualized skeletal structures are unremarkable. IMPRESSION: No active disease. Electronically Signed   By: Marijo Conception M.D.   On: 06/26/2021 11:37   ECHOCARDIOGRAM COMPLETE  Result Date: 06/27/2021    ECHOCARDIOGRAM REPORT   Patient Name:   Katelyn Kelly Date of Exam: 06/27/2021 Medical Rec #:  [id number]         Height:       61.0 in Accession #:    [id number]        Weight:       181.9 lb Date of Birth:  [birth year]         BSA:          1.814 m Patient Age:    20 years          BP:           122/75 mmHg Patient Gender: F                 HR:           88 bpm. Exam Location:  Inpatient Procedure: 2D Echo, Cardiac Doppler and Color Doppler Indications:    NSTEMI I21.4  History:        Patient has no prior history of Echocardiogram examinations.                 Risk Factors:Diabetes and Hypertension.  Sonographer:    Bernadene Person RDCS Referring Phys: [id number] [place]  1. Left ventricular ejection fraction, by estimation, is 60 to 65%. The left ventricle has normal function. The left ventricle has no regional wall motion abnormalities. Left ventricular diastolic parameters are consistent with Grade I diastolic dysfunction (impaired relaxation).  2. Right ventricular systolic function is normal. The right  ventricular size is normal. Tricuspid regurgitation signal is inadequate for assessing PA pressure.  3. The mitral valve is normal in structure. No evidence of mitral valve regurgitation. No evidence of mitral stenosis.  4. The aortic valve is tricuspid. Aortic valve regurgitation is not visualized. Mild aortic valve sclerosis is present, with no evidence of aortic valve stenosis.  5. The inferior vena cava is normal in size with greater than 50% respiratory variability, suggesting right atrial pressure of 3 mmHg. FINDINGS  Left Ventricle: Left ventricular ejection fraction, by estimation, is 60  to 65%. The left ventricle has normal function. The left ventricle has no regional wall motion abnormalities. The left ventricular internal cavity size was normal in size. There is  no left ventricular hypertrophy. Left ventricular diastolic parameters are consistent with Grade I diastolic dysfunction (impaired relaxation). Normal left ventricular filling pressure. Right Ventricle: The right ventricular size is normal. No increase in right ventricular wall thickness. Right ventricular systolic function is normal. Tricuspid regurgitation signal is inadequate for assessing PA pressure. Left Atrium: Left atrial size was normal in size. Right Atrium: Right atrial size was normal in size. Pericardium: There is no evidence of pericardial effusion. Mitral Valve: The mitral valve is normal in structure. No evidence of mitral valve regurgitation. No evidence of mitral valve stenosis. Tricuspid Valve: The tricuspid valve is normal in structure. Tricuspid valve regurgitation is not demonstrated. No evidence of tricuspid stenosis. Aortic Valve: The aortic valve is tricuspid. Aortic valve regurgitation is not visualized. Mild aortic valve sclerosis is present, with no evidence of aortic valve stenosis. Pulmonic Valve: The pulmonic valve was normal in structure. Pulmonic valve regurgitation is not visualized. No evidence of pulmonic  stenosis. Aorta: The aortic root is normal in size and structure. Venous: The inferior vena cava is normal in size with greater than 50% respiratory variability, suggesting right atrial pressure of 3 mmHg. IAS/Shunts: No atrial level shunt detected by color flow Doppler.  LEFT VENTRICLE PLAX 2D LVIDd:         4.20 cm  Diastology LVIDs:         3.00 cm  LV e' medial:    7.46 cm/s LV PW:         1.10 cm  LV E/e' medial:  13.8 LV IVS:        1.00 cm  LV e' lateral:   8.25 cm/s LVOT diam:     1.90 cm  LV E/e' lateral: 12.5 LV SV:         64 LV SV Index:   35 LVOT Area:     2.84 cm  RIGHT VENTRICLE RV S prime:     12.00 cm/s TAPSE (M-mode): 1.6 cm LEFT ATRIUM             Index       RIGHT ATRIUM           Index LA diam:        2.80 cm 1.54 cm/m  RA Area:     12.90 cm LA Vol (A2C):   35.1 ml 19.35 ml/m RA Volume:   29.40 ml  16.21 ml/m LA Vol (A4C):   36.1 ml 19.90 ml/m LA Biplane Vol: 36.0 ml 19.85 ml/m  AORTIC VALVE LVOT Vmax:   115.00 cm/s LVOT Vmean:  77.600 cm/s LVOT VTI:    0.226 m  AORTA Ao Root diam: 2.70 cm Ao Asc diam:  2.70 cm MITRAL VALVE MV Area (PHT): 4.06 cm     SHUNTS MV Decel Time: 187 msec     Systemic VTI:  0.23 m MV E velocity: 103.00 cm/s  Systemic Diam: 1.90 cm MV A velocity: 116.00 cm/s MV E/A ratio:  0.89 Fransico Him MD Electronically signed by Fransico Him MD Signature Date/Time: 06/27/2021/10:20:53 AM    Final     Cardiac Studies   Echo 06/27/21  1. Left ventricular ejection fraction, by estimation, is 60 to 65%. The  left ventricle has normal function. The left ventricle has no regional  wall motion abnormalities. Left ventricular diastolic parameters are  consistent  with Grade I diastolic  dysfunction (impaired relaxation).   2. Right ventricular systolic function is normal. The right ventricular  size is normal. Tricuspid regurgitation signal is inadequate for assessing  PA pressure.   3. The mitral valve is normal in structure. No evidence of mitral valve  regurgitation.  No evidence of mitral stenosis.   4. The aortic valve is tricuspid. Aortic valve regurgitation is not  visualized. Mild aortic valve sclerosis is present, with no evidence of  aortic valve stenosis.   5. The inferior vena cava is normal in size with greater than 50%  respiratory variability, suggesting right atrial pressure of 3 mmHg.  Patient Profile     60 y.o. female with PMH type II diabetes, obesity, hyperlipidemia and tobacco abuse presenting with NSTEMI  Assessment & Plan    NSTEMI -hsTn 722 > 4670 -plan for cath tomorrow -Risks and benefits of cardiac catheterization have been discussed with the patient.  These include bleeding, infection, kidney damage, stroke, heart attack, death.  The patient understands these risks and is willing to proceed.  -continue aspirin, heparin -started atorvastatin this admission -started metoprolol this admission -stopping triptan pending angiography, if CAD found would not restart on discharge -based on results of cath and echo, will see if she is a candidate for either EMPACT-MI or SOS-AMI research studies -will need phase 1 cardiac rehab  Type II diabetes Tobacco abuse Obesity Hyperlipidemia -on insulin sliding scale while admitted, on tresiba/humalog insulin as outpatient -does not appear she was on statin or aspirin immediately prior to admission, has been on aspirin in the past -needs tobacco cessation   For questions or updates, please contact [facility] Please consult [email or web address] for contact info under        Signed, Buford Dresser, MD  06/27/2021, 10:57 AM

## 2021-06-27 NOTE — Progress Notes (Signed)
[place] for heparin Indication: chest pain/ACS  Allergies  Allergen Reactions   Meprobamate Hives and Swelling   Sinequan [Doxepin Hcl] Anaphylaxis   Tranxene [Clorazepate Dipotassium] Hives and Swelling   Albuterol Other (See Comments)    Patient does not remember the reaction. This was a record provided via [facility]   Haloperidol Other (See Comments)    Patient does not remember the reaction. This was a record provided via [facility]   Sertraline Other (See Comments)    Patient does not remember the reaction. This was a record provided via [facility] Other (See Comments)    Patient does not remember the reaction. This was a record provided via [facility]    Patient Measurements: Height: '5\' 1"'$  (154.9 cm) Weight: 82.5 kg (181 lb 14.1 oz) IBW/kg (Calculated) : 47.8 Heparin Dosing Weight: HEPARIN DW (KG): 66.5   Vital Signs: Temp: 98.1 F (36.7 C) (08/28 1144) Temp Source: Oral (08/28 1144) BP: 127/80 (08/28 1144) Pulse Rate: 77 (08/28 1144)  Labs: Recent Labs    06/26/21 1050 06/26/21 2027 06/27/21 0542 06/27/21 1319  HGB 15.2*  --   --   --   HCT 45.6  --   --   --   PLT 251  --   --   --   HEPARINUNFRC  --  <0.10* 0.21* 0.42  CREATININE 0.99  --   --   --      Estimated Creatinine Clearance: 58.1 mL/min (by C-G formula based on SCr of 0.99 mg/dL).  Assessment: 60 y.o. female with NSTEMI and is scheduled for a cardiac catheterization tomorrow, 06/28/21. She was on aspirin previously, stopped recently by her PCP, but not due to side effects. She has tolerated aspirin well in the past.  Heparin level is therapeutic (0.42) @ 1250 mL/hr. Per conversation with RN, no s/sx of bleeding or issues with infusion. CBC stable.   Goal of Therapy:  Heparin level 0.3-0.7 units/ml Monitor platelets by anticoagulation protocol: Yes   Plan:  Continue heparin 1250 units/hr Check heparin with AM  labs Monitor CBC, heparin level, and s/sx of bleeding daily  Pauletta Browns, Pharm.D. PGY-1 Pharmacy Resident [phone] 06/27/2021 2:40 PM

## 2021-06-27 NOTE — H&P (View-Only) (Signed)
Progress Note  Patient Name: Katelyn Kelly Date of Encounter: 06/27/2021  [facility] Cardiologist: Carlyle Dolly, MD   Subjective   Admitted overnight for NSTEMI, history reviewed. Currently chest pain free. We reviewed the results of her bloodwork and echo. She is tearful and anxious today. We discussed cath at length (see below), and she is amenable. She asked me to speak to her son Nicki Reaper over the phone, and I discussed findings and cath with him as well. She was on aspirin previously, stopped recently by her PCP, but not due to side effects. She has tolerated aspirin well in the past.  Inpatient Medications    Scheduled Meds:  aspirin EC  81 mg Oral Daily   atorvastatin  80 mg Oral QHS   busPIRone  7.5 mg Oral TID   insulin aspart  0-9 Units Subcutaneous TID WC   insulin aspart  2 Units Subcutaneous TID WC   insulin glargine-yfgn  40 Units Subcutaneous QHS   metoprolol tartrate  12.5 mg Oral BID   nicotine  21 mg Transdermal Daily   pramipexole  0.25 mg Oral QHS   Continuous Infusions:  heparin 1,250 Units/hr (06/27/21 0709)   PRN Meds: acetaminophen, ALPRAZolam, nitroGLYCERIN, ondansetron (ZOFRAN) IV   Vital Signs    Vitals:   06/27/21 0333 06/27/21 0450 06/27/21 0840 06/27/21 0940  BP: 122/75  111/70 118/79  Pulse: 84  86 86  Resp: 18   17  Temp: 98 F (36.7 C)   98.2 F (36.8 C)  TempSrc: Oral   Oral  SpO2: 95%   95%  Weight:  82.5 kg    Height:        Intake/Output Summary (Last 24 hours) at 06/27/2021 1057 Last data filed at 06/26/2021 1855 Gross per 24 hour  Intake 310.62 ml  Output --  Net 310.62 ml   Last 3 Weights 06/27/2021 06/26/2021 06/26/2021  Weight (lbs) 181 lb 14.1 oz 181 lb 3.2 oz 181 lb  Weight (kg) 82.5 kg 82.192 kg 82.101 kg  Some encounter information is confidential and restricted. Go to Review Flowsheets activity to see all data.      Telemetry    NSR - Personally Reviewed  ECG    8/27 NSR at 85 bpm - Personally  Reviewed  Physical Exam   GEN: No acute distress.   Neck: No JVD Cardiac: RRR, no murmurs, rubs, or gallops.  Respiratory: Clear to auscultation bilaterally. GI: Soft, nontender, non-distended  MS: No edema; No deformity. Neuro:  Nonfocal  Psych: Normal affect   Labs    High Sensitivity Troponin:   Recent Labs  Lab 06/26/21 1050 06/26/21 1243  TROPONINIHS 722* 4,670*      Chemistry Recent Labs  Lab 06/26/21 1050  NA 137  K 4.2  CL 110  CO2 21*  GLUCOSE 302*  BUN 10  CREATININE 0.99  CALCIUM 8.2*  GFRNONAA >60  ANIONGAP 6     Hematology Recent Labs  Lab 06/26/21 1050  WBC 10.8*  RBC 4.70  HGB 15.2*  HCT 45.6  MCV 97.0  MCH 32.3  MCHC 33.3  RDW 13.0  PLT 251    BNP Recent Labs  Lab 06/26/21 1050  BNP 16.0     DDimer  Recent Labs  Lab 06/26/21 1050  DDIMER 0.91*     Radiology    CT HEAD WO CONTRAST (5MM)  Result Date: 06/26/2021 CLINICAL DATA:  Acute neck pain. No red flags. Pain radiates from fingers to shoulders for  3 weeks. EXAM: CT HEAD WITHOUT CONTRAST CT CERVICAL SPINE WITHOUT CONTRAST TECHNIQUE: Multidetector CT imaging of the head and cervical spine was performed following the standard protocol without intravenous contrast. Multiplanar CT image reconstructions of the cervical spine were also generated. COMPARISON:  July 28, 2013 FINDINGS: CT HEAD FINDINGS Brain: No evidence of acute infarction, hemorrhage, hydrocephalus, extra-axial collection or mass lesion/mass effect. Vascular: Calcified atherosclerosis in the intracranial carotids is mild. Skull: Normal. Negative for fracture or focal lesion. Sinuses/Orbits: Fluid in the posterior mastoid air cells was also present 2014 on the right but is new on the left. No bony erosion or soft tissue swelling. Middle ears are well aerated. Paranasal sinuses are otherwise unremarkable. Other: None. CT CERVICAL SPINE FINDINGS Alignment: Normal. Skull base and vertebrae: No acute fracture. No  primary bone lesion or focal pathologic process. Soft tissues and spinal canal: No prevertebral fluid or swelling. No visible canal hematoma. Disc levels: Multilevel degenerative disc disease and upper facet degenerative changes, particularly on the left. Upper chest: Negative. Other: No other abnormalities. IMPRESSION: 1. Fluid in the mastoid air cells without bony erosion or overlying soft tissue swelling is of doubtful acute significance. 2. No acute intracranial abnormalities. 3. No fracture or traumatic malalignment in the cervical spine. Degenerative changes. Electronically Signed   By: Dorise Bullion III M.D.   On: 06/26/2021 12:13   CT Angio Chest PE W and/or Wo Contrast  Result Date: 06/26/2021 CLINICAL DATA:  Pain, elevated D-dimer, low/intermediate prob PE suspected EXAM: CT ANGIOGRAPHY CHEST WITH CONTRAST TECHNIQUE: Multidetector CT imaging of the chest was performed using the standard protocol during bolus administration of intravenous contrast. Multiplanar CT image reconstructions and MIPs were obtained to evaluate the vascular anatomy. CONTRAST:  63m OMNIPAQUE IOHEXOL 350 MG/ML SOLN COMPARISON:  01/10/2012 FINDINGS: Cardiovascular: Heart size normal. No pericardial effusion. Satisfactory opacification of pulmonary arteries noted, and there is no evidence of pulmonary emboli. Adequate contrast opacification of the thoracic aorta with no evidence of dissection, aneurysm, or stenosis. There is bovine variant brachiocephalic arch anatomy without proximal stenosis. Scattered calcified plaque in the arch, descending thoracic and visualized proximal abdominal aorta. Mediastinum/Nodes: No mass or adenopathy. Lungs/Pleura: No pleural effusion. No pneumothorax. Extensive mild ground-glass opacities involving upper lobes more than bases, with some geographic areas of sparing peripherally. No discrete nodule or airspace consolidation. Upper Abdomen: Marked heterogeneity of the hepatic parenchyma suggesting  low Anterior vertebral endplate spurring at multiple levels in the mid and lower thoracic spine. fatty infiltration, although incompletely characterized on this very early arterial phase study. Cholecystectomy clips. Bilateral adrenal adenomas. No acute findings. Musculoskeletal: Mild spondylitic changes in the thoracic spine. No fracture or worrisome bone lesion. Review of the MIP images confirms the above findings. IMPRESSION: 1. Negative for acute PE or thoracic aortic dissection. 2. Mild bilateral ground-glass opacities may represent early edema or alveolitis. Electronically Signed   By: DLucrezia EuropeM.D.   On: 06/26/2021 15:08   CT Cervical Spine Wo Contrast  Result Date: 06/26/2021 CLINICAL DATA:  Acute neck pain. No red flags. Pain radiates from fingers to shoulders for 3 weeks. EXAM: CT HEAD WITHOUT CONTRAST CT CERVICAL SPINE WITHOUT CONTRAST TECHNIQUE: Multidetector CT imaging of the head and cervical spine was performed following the standard protocol without intravenous contrast. Multiplanar CT image reconstructions of the cervical spine were also generated. COMPARISON:  July 28, 2013 FINDINGS: CT HEAD FINDINGS Brain: No evidence of acute infarction, hemorrhage, hydrocephalus, extra-axial collection or mass lesion/mass effect. Vascular: Calcified atherosclerosis in the  intracranial carotids is mild. Skull: Normal. Negative for fracture or focal lesion. Sinuses/Orbits: Fluid in the posterior mastoid air cells was also present 2014 on the right but is new on the left. No bony erosion or soft tissue swelling. Middle ears are well aerated. Paranasal sinuses are otherwise unremarkable. Other: None. CT CERVICAL SPINE FINDINGS Alignment: Normal. Skull base and vertebrae: No acute fracture. No primary bone lesion or focal pathologic process. Soft tissues and spinal canal: No prevertebral fluid or swelling. No visible canal hematoma. Disc levels: Multilevel degenerative disc disease and upper facet  degenerative changes, particularly on the left. Upper chest: Negative. Other: No other abnormalities. IMPRESSION: 1. Fluid in the mastoid air cells without bony erosion or overlying soft tissue swelling is of doubtful acute significance. 2. No acute intracranial abnormalities. 3. No fracture or traumatic malalignment in the cervical spine. Degenerative changes. Electronically Signed   By: Dorise Bullion III M.D.   On: 06/26/2021 12:13   DG Chest Portable 1 View  Result Date: 06/26/2021 CLINICAL DATA:  Chest pain. EXAM: PORTABLE CHEST 1 VIEW COMPARISON:  November 13, 2020. FINDINGS: The heart size and mediastinal contours are within normal limits. Both lungs are clear. The visualized skeletal structures are unremarkable. IMPRESSION: No active disease. Electronically Signed   By: Marijo Conception M.D.   On: 06/26/2021 11:37   ECHOCARDIOGRAM COMPLETE  Result Date: 06/27/2021    ECHOCARDIOGRAM REPORT   Patient Name:   Katelyn Kelly Date of Exam: 06/27/2021 Medical Rec #:  [id number]         Height:       61.0 in Accession #:    [id number]        Weight:       181.9 lb Date of Birth:  [birth year]         BSA:          1.814 m Patient Age:    60 years          BP:           122/75 mmHg Patient Gender: F                 HR:           88 bpm. Exam Location:  Inpatient Procedure: 2D Echo, Cardiac Doppler and Color Doppler Indications:    NSTEMI I21.4  History:        Patient has no prior history of Echocardiogram examinations.                 Risk Factors:Diabetes and Hypertension.  Sonographer:    Bernadene Person RDCS Referring Phys: [id number] [place]  1. Left ventricular ejection fraction, by estimation, is 60 to 65%. The left ventricle has normal function. The left ventricle has no regional wall motion abnormalities. Left ventricular diastolic parameters are consistent with Grade I diastolic dysfunction (impaired relaxation).  2. Right ventricular systolic function is normal. The right  ventricular size is normal. Tricuspid regurgitation signal is inadequate for assessing PA pressure.  3. The mitral valve is normal in structure. No evidence of mitral valve regurgitation. No evidence of mitral stenosis.  4. The aortic valve is tricuspid. Aortic valve regurgitation is not visualized. Mild aortic valve sclerosis is present, with no evidence of aortic valve stenosis.  5. The inferior vena cava is normal in size with greater than 50% respiratory variability, suggesting right atrial pressure of 3 mmHg. FINDINGS  Left Ventricle: Left ventricular ejection fraction, by estimation, is 60  to 65%. The left ventricle has normal function. The left ventricle has no regional wall motion abnormalities. The left ventricular internal cavity size was normal in size. There is  no left ventricular hypertrophy. Left ventricular diastolic parameters are consistent with Grade I diastolic dysfunction (impaired relaxation). Normal left ventricular filling pressure. Right Ventricle: The right ventricular size is normal. No increase in right ventricular wall thickness. Right ventricular systolic function is normal. Tricuspid regurgitation signal is inadequate for assessing PA pressure. Left Atrium: Left atrial size was normal in size. Right Atrium: Right atrial size was normal in size. Pericardium: There is no evidence of pericardial effusion. Mitral Valve: The mitral valve is normal in structure. No evidence of mitral valve regurgitation. No evidence of mitral valve stenosis. Tricuspid Valve: The tricuspid valve is normal in structure. Tricuspid valve regurgitation is not demonstrated. No evidence of tricuspid stenosis. Aortic Valve: The aortic valve is tricuspid. Aortic valve regurgitation is not visualized. Mild aortic valve sclerosis is present, with no evidence of aortic valve stenosis. Pulmonic Valve: The pulmonic valve was normal in structure. Pulmonic valve regurgitation is not visualized. No evidence of pulmonic  stenosis. Aorta: The aortic root is normal in size and structure. Venous: The inferior vena cava is normal in size with greater than 50% respiratory variability, suggesting right atrial pressure of 3 mmHg. IAS/Shunts: No atrial level shunt detected by color flow Doppler.  LEFT VENTRICLE PLAX 2D LVIDd:         4.20 cm  Diastology LVIDs:         3.00 cm  LV e' medial:    7.46 cm/s LV PW:         1.10 cm  LV E/e' medial:  13.8 LV IVS:        1.00 cm  LV e' lateral:   8.25 cm/s LVOT diam:     1.90 cm  LV E/e' lateral: 12.5 LV SV:         64 LV SV Index:   35 LVOT Area:     2.84 cm  RIGHT VENTRICLE RV S prime:     12.00 cm/s TAPSE (M-mode): 1.6 cm LEFT ATRIUM             Index       RIGHT ATRIUM           Index LA diam:        2.80 cm 1.54 cm/m  RA Area:     12.90 cm LA Vol (A2C):   35.1 ml 19.35 ml/m RA Volume:   29.40 ml  16.21 ml/m LA Vol (A4C):   36.1 ml 19.90 ml/m LA Biplane Vol: 36.0 ml 19.85 ml/m  AORTIC VALVE LVOT Vmax:   115.00 cm/s LVOT Vmean:  77.600 cm/s LVOT VTI:    0.226 m  AORTA Ao Root diam: 2.70 cm Ao Asc diam:  2.70 cm MITRAL VALVE MV Area (PHT): 4.06 cm     SHUNTS MV Decel Time: 187 msec     Systemic VTI:  0.23 m MV E velocity: 103.00 cm/s  Systemic Diam: 1.90 cm MV A velocity: 116.00 cm/s MV E/A ratio:  0.89 Fransico Him MD Electronically signed by Fransico Him MD Signature Date/Time: 06/27/2021/10:20:53 AM    Final     Cardiac Studies   Echo 06/27/21  1. Left ventricular ejection fraction, by estimation, is 60 to 65%. The  left ventricle has normal function. The left ventricle has no regional  wall motion abnormalities. Left ventricular diastolic parameters are  consistent  with Grade I diastolic  dysfunction (impaired relaxation).   2. Right ventricular systolic function is normal. The right ventricular  size is normal. Tricuspid regurgitation signal is inadequate for assessing  PA pressure.   3. The mitral valve is normal in structure. No evidence of mitral valve  regurgitation.  No evidence of mitral stenosis.   4. The aortic valve is tricuspid. Aortic valve regurgitation is not  visualized. Mild aortic valve sclerosis is present, with no evidence of  aortic valve stenosis.   5. The inferior vena cava is normal in size with greater than 50%  respiratory variability, suggesting right atrial pressure of 3 mmHg.  Patient Profile     60 y.o. female with PMH type II diabetes, obesity, hyperlipidemia and tobacco abuse presenting with NSTEMI  Assessment & Plan    NSTEMI -hsTn 722 > 4670 -plan for cath tomorrow -Risks and benefits of cardiac catheterization have been discussed with the patient.  These include bleeding, infection, kidney damage, stroke, heart attack, death.  The patient understands these risks and is willing to proceed.  -continue aspirin, heparin -started atorvastatin this admission -started metoprolol this admission -stopping triptan pending angiography, if CAD found would not restart on discharge -based on results of cath and echo, will see if she is a candidate for either EMPACT-MI or SOS-AMI research studies -will need phase 1 cardiac rehab  Type II diabetes Tobacco abuse Obesity Hyperlipidemia -on insulin sliding scale while admitted, on tresiba/humalog insulin as outpatient -does not appear she was on statin or aspirin immediately prior to admission, has been on aspirin in the past -needs tobacco cessation   For questions or updates, please contact [facility] Please consult [email or web address] for contact info under        Signed, Buford Dresser, MD  06/27/2021, 10:57 AM

## 2021-06-27 NOTE — Progress Notes (Signed)
  Echocardiogram 2D Echocardiogram has been performed.  Fidel Levy 06/27/2021, 8:41 AM

## 2021-06-27 NOTE — Progress Notes (Signed)
[place] for heparin Indication: chest pain/ACS  Allergies  Allergen Reactions   Meprobamate Hives and Swelling   Sinequan [Doxepin Hcl] Anaphylaxis   Tranxene [Clorazepate Dipotassium] Hives and Swelling   Albuterol Other (See Comments)    Patient does not remember the reaction. This was a record provided via [facility]   Haloperidol Other (See Comments)    Patient does not remember the reaction. This was a record provided via [facility]   Sertraline Other (See Comments)    Patient does not remember the reaction. This was a record provided via [place] Other (See Comments)    Patient does not remember the reaction. This was a record provided via [facility]    Patient Measurements: Height: '5\' 1"'$  (154.9 cm) Weight: 82.5 kg (181 lb 14.1 oz) IBW/kg (Calculated) : 47.8 Heparin Dosing Weight: HEPARIN DW (KG): 66.5   Vital Signs: Temp: 98 F (36.7 C) (08/28 0333) Temp Source: Oral (08/28 0333) BP: 122/75 (08/28 0333) Pulse Rate: 84 (08/28 0333)  Labs: Recent Labs    06/26/21 1050 06/26/21 2027 06/27/21 0542  HGB 15.2*  --   --   HCT 45.6  --   --   PLT 251  --   --   HEPARINUNFRC  --  <0.10* 0.21*  CREATININE 0.99  --   --      Estimated Creatinine Clearance: 58.1 mL/min (by C-G formula based on SCr of 0.99 mg/dL).  Assessment: 60 y.o. female with NSTEMI for heparin   Goal of Therapy:  Heparin level 0.3-0.7 units/ml Monitor platelets by anticoagulation protocol: Yes   Plan:  Heparin 2000 units IV bolus, then increase heparin 1250 units/hr Check heparin level in 6 hours.   Phillis Knack, PharmD, BCPS

## 2021-06-28 ENCOUNTER — Encounter (HOSPITAL_COMMUNITY): Payer: Self-pay | Admitting: Cardiovascular Disease

## 2021-06-28 ENCOUNTER — Encounter (HOSPITAL_COMMUNITY)
Admission: EM | Disposition: A | Discharge status: Home / Self Care | Payer: Self-pay | Source: Home / Self Care | Attending: Cardiology

## 2021-06-28 DIAGNOSIS — E118 Type 2 diabetes mellitus with unspecified complications: Secondary | ICD-10-CM

## 2021-06-28 DIAGNOSIS — F411 Generalized anxiety disorder: Secondary | ICD-10-CM | POA: Diagnosis not present

## 2021-06-28 DIAGNOSIS — E1169 Type 2 diabetes mellitus with other specified complication: Secondary | ICD-10-CM | POA: Diagnosis not present

## 2021-06-28 DIAGNOSIS — I214 Non-ST elevation (NSTEMI) myocardial infarction: Secondary | ICD-10-CM | POA: Diagnosis not present

## 2021-06-28 DIAGNOSIS — I2510 Atherosclerotic heart disease of native coronary artery without angina pectoris: Secondary | ICD-10-CM | POA: Diagnosis not present

## 2021-06-28 HISTORY — PX: LEFT HEART CATH AND CORONARY ANGIOGRAPHY: CATH118249

## 2021-06-28 HISTORY — PX: CORONARY BALLOON ANGIOPLASTY: CATH118233

## 2021-06-28 LAB — GLUCOSE, CAPILLARY
Glucose-Capillary: 109 mg/dL — ABNORMAL HIGH (ref 70–99)
Glucose-Capillary: 110 mg/dL — ABNORMAL HIGH (ref 70–99)
Glucose-Capillary: 170 mg/dL — ABNORMAL HIGH (ref 70–99)
Glucose-Capillary: 116 mg/dL — ABNORMAL HIGH (ref 70–99)
Glucose-Capillary: 131 mg/dL — ABNORMAL HIGH (ref 70–99)

## 2021-06-28 LAB — CBC
HCT: 43 % (ref 36.0–46.0)
Hemoglobin: 14.3 g/dL (ref 12.0–15.0)
MCH: 31.8 pg (ref 26.0–34.0)
MCHC: 33.3 g/dL (ref 30.0–36.0)
MCV: 95.6 fL (ref 80.0–100.0)
Platelets: 216 10*3/uL (ref 150–400)
RBC: 4.5 MIL/uL (ref 3.87–5.11)
RDW: 12.8 % (ref 11.5–15.5)
WBC: 10.4 10*3/uL (ref 4.0–10.5)
nRBC: 0 % (ref 0.0–0.2)

## 2021-06-28 LAB — HEPARIN LEVEL (UNFRACTIONATED)
Heparin Unfractionated: 0.21 IU/mL — ABNORMAL LOW (ref 0.30–0.70)
Heparin Unfractionated: 0.21 IU/mL — ABNORMAL LOW (ref 0.30–0.70)

## 2021-06-28 LAB — POCT ACTIVATED CLOTTING TIME: Activated Clotting Time: 231 seconds

## 2021-06-28 SURGERY — LEFT HEART CATH AND CORONARY ANGIOGRAPHY
Anesthesia: LOCAL

## 2021-06-28 MED ORDER — SODIUM CHLORIDE 0.9% FLUSH
3.0000 mL | Freq: Two times a day (BID) | INTRAVENOUS | Status: DC
  Administered 2021-06-28: 3 mL via INTRAVENOUS

## 2021-06-28 MED ORDER — SODIUM CHLORIDE 0.9 % IV SOLN: 250.0000 mL | INTRAVENOUS | Status: DC | PRN

## 2021-06-28 MED ORDER — MIDAZOLAM HCL 2 MG/2ML IJ SOLN
INTRAMUSCULAR | Status: DC | PRN
  Administered 2021-06-28: 1 mg via INTRAVENOUS
  Administered 2021-06-28 (×2): 2 mg via INTRAVENOUS

## 2021-06-28 MED ORDER — HEPARIN SODIUM (PORCINE) 1000 UNIT/ML IJ SOLN
INTRAMUSCULAR | Status: DC | PRN
  Administered 2021-06-28: 3000 [IU] via INTRAVENOUS
  Administered 2021-06-28 (×2): 4000 [IU] via INTRAVENOUS

## 2021-06-28 MED ORDER — MIDAZOLAM HCL 2 MG/2ML IJ SOLN
INTRAMUSCULAR | Status: AC
  Filled 2021-06-28: qty 2

## 2021-06-28 MED ORDER — MORPHINE SULFATE (PF) 2 MG/ML IV SOLN
2.0000 mg | Freq: Once | INTRAVENOUS | Status: AC
  Administered 2021-06-28: 2 mg via INTRAVENOUS
  Filled 2021-06-28: qty 1

## 2021-06-28 MED ORDER — HEPARIN (PORCINE) IN NACL 1000-0.9 UT/500ML-% IV SOLN
INTRAVENOUS | Status: AC
  Filled 2021-06-28: qty 1000

## 2021-06-28 MED ORDER — FENTANYL CITRATE (PF) 100 MCG/2ML IJ SOLN
INTRAMUSCULAR | Status: AC
  Filled 2021-06-28: qty 2

## 2021-06-28 MED ORDER — HYDRALAZINE HCL 20 MG/ML IJ SOLN: 10.0000 mg | INTRAMUSCULAR | Status: AC | PRN

## 2021-06-28 MED ORDER — TICAGRELOR 90 MG PO TABS
ORAL_TABLET | ORAL | Status: DC | PRN
  Administered 2021-06-28: 180 mg via ORAL

## 2021-06-28 MED ORDER — LIDOCAINE HCL (PF) 1 % IJ SOLN
INTRAMUSCULAR | Status: AC
  Filled 2021-06-28: qty 30

## 2021-06-28 MED ORDER — TICAGRELOR 90 MG PO TABS
ORAL_TABLET | ORAL | Status: AC
  Filled 2021-06-28: qty 2

## 2021-06-28 MED ORDER — TICAGRELOR 90 MG PO TABS
90.0000 mg | ORAL_TABLET | Freq: Two times a day (BID) | ORAL | Status: DC
  Administered 2021-06-28: 90 mg via ORAL
  Filled 2021-06-28 (×2): qty 1

## 2021-06-28 MED ORDER — LIVING WELL WITH DIABETES BOOK
Freq: Once | Status: AC
  Filled 2021-06-28: qty 1

## 2021-06-28 MED ORDER — HEPARIN (PORCINE) IN NACL 1000-0.9 UT/500ML-% IV SOLN
INTRAVENOUS | Status: DC | PRN
  Administered 2021-06-28 (×2): 500 mL

## 2021-06-28 MED ORDER — NITROGLYCERIN 1 MG/10 ML FOR IR/CATH LAB
INTRA_ARTERIAL | Status: DC | PRN
  Administered 2021-06-28: 150 ug via INTRACORONARY
  Administered 2021-06-28: 200 ug via INTRA_ARTERIAL
  Administered 2021-06-28: 150 ug via INTRACORONARY

## 2021-06-28 MED ORDER — VERAPAMIL HCL 2.5 MG/ML IV SOLN
INTRAVENOUS | Status: DC | PRN
  Administered 2021-06-28: 2 mg via INTRAVENOUS

## 2021-06-28 MED ORDER — SODIUM CHLORIDE 0.9 % WEIGHT BASED INFUSION: 1.0000 mL/kg/h | INTRAVENOUS | Status: AC

## 2021-06-28 MED ORDER — HEPARIN SODIUM (PORCINE) 1000 UNIT/ML IJ SOLN
INTRAMUSCULAR | Status: AC
  Filled 2021-06-28: qty 1

## 2021-06-28 MED ORDER — LABETALOL HCL 5 MG/ML IV SOLN: 10.0000 mg | INTRAVENOUS | Status: AC | PRN

## 2021-06-28 MED ORDER — FENTANYL CITRATE (PF) 100 MCG/2ML IJ SOLN
INTRAMUSCULAR | Status: DC | PRN
  Administered 2021-06-28 (×3): 50 ug via INTRAVENOUS

## 2021-06-28 MED ORDER — SODIUM CHLORIDE 0.9% FLUSH: 3.0000 mL | INTRAVENOUS | Status: DC | PRN

## 2021-06-28 MED ORDER — LIDOCAINE HCL (PF) 1 % IJ SOLN
INTRAMUSCULAR | Status: DC | PRN
  Administered 2021-06-28: 2 mL

## 2021-06-28 MED ORDER — IOHEXOL 350 MG/ML SOLN
INTRAVENOUS | Status: DC | PRN
  Administered 2021-06-28: 95 mL

## 2021-06-28 MED ORDER — VERAPAMIL HCL 2.5 MG/ML IV SOLN
INTRAVENOUS | Status: AC
  Filled 2021-06-28: qty 2

## 2021-06-28 MED ORDER — VERAPAMIL HCL 2.5 MG/ML IV SOLN
INTRAVENOUS | Status: DC | PRN
  Administered 2021-06-28: 10 mL via INTRA_ARTERIAL

## 2021-06-28 SURGICAL SUPPLY — 15 items
BALLN SAPPHIRE 2.0X12 (BALLOONS) ×2
BALLOON SAPPHIRE 2.0X12 (BALLOONS) IMPLANT
CATH 5FR JL3.5 JR4 ANG PIG MP (CATHETERS) ×1 IMPLANT
CATH LAUNCHER 5F EBU3.5 (CATHETERS) ×1 IMPLANT
DEVICE RAD COMP TR BAND LRG (VASCULAR PRODUCTS) ×1 IMPLANT
GLIDESHEATH SLEND SS 6F .021 (SHEATH) ×1 IMPLANT
GUIDEWIRE INQWIRE 1.5J.035X260 (WIRE) IMPLANT
INQWIRE 1.5J .035X260CM (WIRE) ×2
KIT ENCORE 26 ADVANTAGE (KITS) ×1 IMPLANT
KIT HEART LEFT (KITS) ×2 IMPLANT
PACK CARDIAC CATHETERIZATION (CUSTOM PROCEDURE TRAY) ×2 IMPLANT
TRANSDUCER W/STOPCOCK (MISCELLANEOUS) ×2 IMPLANT
TUBING CIL FLEX 10 FLL-RA (TUBING) ×2 IMPLANT
WIRE COUGAR XT STRL 190CM (WIRE) ×1 IMPLANT
WIRE HI TORQ VERSACORE-J 145CM (WIRE) ×1 IMPLANT

## 2021-06-28 NOTE — Interval H&P Note (Signed)
Cath Lab Visit (complete for each Cath Lab visit)  Clinical Evaluation Leading to the Procedure:   ACS: Yes.    Non-ACS:    Anginal Classification: CCS IV  Anti-ischemic medical therapy: Minimal Therapy (1 class of medications)  Non-Invasive Test Results: No non-invasive testing performed  Prior CABG: No previous CABG      History and Physical Interval Note:  06/28/2021 11:59 AM  Katelyn Kelly  has presented today for surgery, with the diagnosis of NSTEMI.  The various methods of treatment have been discussed with the patient and family. After consideration of risks, benefits and other options for treatment, the patient has consented to  Procedure(s): LEFT HEART CATH AND CORONARY ANGIOGRAPHY (N/A) as a surgical intervention.  The patient's history has been reviewed, patient examined, no change in status, stable for surgery.  I have reviewed the patient's chart and labs.  Questions were answered to the patient's satisfaction.     Sherren Mocha

## 2021-06-28 NOTE — Progress Notes (Signed)
[place] for heparin Indication: chest pain/ACS  Allergies  Allergen Reactions   Meprobamate Hives and Swelling   Sinequan [Doxepin Hcl] Anaphylaxis   Tranxene [Clorazepate Dipotassium] Hives and Swelling   Albuterol Other (See Comments)    Patient does not remember the reaction. This was a record provided via [facility]   Haloperidol Other (See Comments)    Patient does not remember the reaction. This was a record provided via [facility]   Sertraline Other (See Comments)    Patient does not remember the reaction. This was a record provided via [place] Other (See Comments)    Patient does not remember the reaction. This was a record provided via [facility]    Patient Measurements: Height: '5\' 1"'$  (154.9 cm) Weight: 82.5 kg (181 lb 14.1 oz) IBW/kg (Calculated) : 47.8 Heparin Dosing Weight: HEPARIN DW (KG): 66.5   Vital Signs: Temp: 97.5 F (36.4 C) (08/28 2000) Temp Source: Axillary (08/28 2000) BP: 113/83 (08/28 2010) Pulse Rate: 83 (08/28 2000)  Labs: Recent Labs    06/26/21 1050 06/26/21 2027 06/27/21 0542 06/27/21 1319 06/28/21 0050  HGB 15.2*  --   --   --  14.3  HCT 45.6  --   --   --  43.0  PLT 251  --   --   --  216  HEPARINUNFRC  --    < > 0.21* 0.42 0.21*  CREATININE 0.99  --   --   --   --    < > = values in this interval not displayed.     Estimated Creatinine Clearance: 58.1 mL/min (by C-G formula based on SCr of 0.99 mg/dL).  Assessment: 60 y.o. female with NSTEMI and is scheduled for a cardiac catheterization tomorrow, 06/28/21. She was on aspirin previously, stopped recently by her PCP, but not due to side effects. She has tolerated aspirin well in the past.  Heparin level is therapeutic (0.42) @ 1250 mL/hr. Per conversation with RN, no s/sx of bleeding or issues with infusion. CBC stable.   8/29 AM update:  Heparin level low CBC stable  Goal of Therapy:  Heparin  level 0.3-0.7 units/ml Monitor platelets by anticoagulation protocol: Yes   Plan:  Inc heparin to 1350 units/hr 1100 heparin level  Narda Bonds, PharmD, BCPS Clinical Pharmacist Phone: [phone]

## 2021-06-28 NOTE — Progress Notes (Signed)
Progress Note  Patient Name: Katelyn Kelly Date of Encounter: 06/28/2021  [facility] Cardiologist: Carlyle Dolly, MD   Subjective   Quite anxious this morning. No chest pain. Planned for cardiac cath today.   Inpatient Medications    Scheduled Meds:  aspirin EC  81 mg Oral Daily   atorvastatin  80 mg Oral QHS   busPIRone  7.5 mg Oral TID   insulin aspart  0-9 Units Subcutaneous TID WC   insulin aspart  2 Units Subcutaneous TID WC   insulin glargine-yfgn  40 Units Subcutaneous QHS   metoprolol tartrate  12.5 mg Oral BID   nicotine  21 mg Transdermal Daily   pramipexole  0.25 mg Oral QHS   sodium chloride flush  3 mL Intravenous Q12H   Continuous Infusions:  sodium chloride     sodium chloride 1 mL/kg/hr (06/28/21 0558)   heparin 1,350 Units/hr (06/28/21 0540)   PRN Meds: sodium chloride, acetaminophen, ALPRAZolam, nitroGLYCERIN, ondansetron (ZOFRAN) IV, sodium chloride flush   Vital Signs    Vitals:   06/27/21 2010 06/28/21 0500 06/28/21 0551 06/28/21 0700  BP: 113/83  122/80 101/77  Pulse:   94 93  Resp:   18 19  Temp:   97.8 F (36.6 C) 98.2 F (36.8 C)  TempSrc:   Oral Oral  SpO2:   95% 95%  Weight:  81.5 kg    Height:        Intake/Output Summary (Last 24 hours) at 06/28/2021 0804 Last data filed at 06/28/2021 0500 Gross per 24 hour  Intake 358.59 ml  Output --  Net 358.59 ml   Last 3 Weights 06/28/2021 06/27/2021 06/26/2021  Weight (lbs) 179 lb 9.6 oz 181 lb 14.1 oz 181 lb 3.2 oz  Weight (kg) 81.466 kg 82.5 kg 82.192 kg  Some encounter information is confidential and restricted. Go to Review Flowsheets activity to see all data.      Telemetry    SR - Personally Reviewed  ECG    No new tracing this morning.  Physical Exam   GEN: No acute distress.   Neck: No JVD Cardiac: RRR, no murmurs, rubs, or gallops.  Respiratory: Clear to auscultation bilaterally. GI: Soft, nontender, non-distended  MS: No edema; No deformity. Neuro:   Nonfocal  Psych: Normal affect   Labs    High Sensitivity Troponin:   Recent Labs  Lab 06/26/21 1050 06/26/21 1243  TROPONINIHS 722* 4,670*      Chemistry Recent Labs  Lab 06/26/21 1050  NA 137  K 4.2  CL 110  CO2 21*  GLUCOSE 302*  BUN 10  CREATININE 0.99  CALCIUM 8.2*  GFRNONAA >60  ANIONGAP 6     Hematology Recent Labs  Lab 06/26/21 1050 06/28/21 0050  WBC 10.8* 10.4  RBC 4.70 4.50  HGB 15.2* 14.3  HCT 45.6 43.0  MCV 97.0 95.6  MCH 32.3 31.8  MCHC 33.3 33.3  RDW 13.0 12.8  PLT 251 216    BNP Recent Labs  Lab 06/26/21 1050  BNP 16.0     DDimer  Recent Labs  Lab 06/26/21 1050  DDIMER 0.91*     Radiology    CT HEAD WO CONTRAST (5MM)  Result Date: 06/26/2021 CLINICAL DATA:  Acute neck pain. No red flags. Pain radiates from fingers to shoulders for 3 weeks. EXAM: CT HEAD WITHOUT CONTRAST CT CERVICAL SPINE WITHOUT CONTRAST TECHNIQUE: Multidetector CT imaging of the head and cervical spine was performed following the standard protocol without intravenous  contrast. Multiplanar CT image reconstructions of the cervical spine were also generated. COMPARISON:  July 28, 2013 FINDINGS: CT HEAD FINDINGS Brain: No evidence of acute infarction, hemorrhage, hydrocephalus, extra-axial collection or mass lesion/mass effect. Vascular: Calcified atherosclerosis in the intracranial carotids is mild. Skull: Normal. Negative for fracture or focal lesion. Sinuses/Orbits: Fluid in the posterior mastoid air cells was also present 2014 on the right but is new on the left. No bony erosion or soft tissue swelling. Middle ears are well aerated. Paranasal sinuses are otherwise unremarkable. Other: None. CT CERVICAL SPINE FINDINGS Alignment: Normal. Skull base and vertebrae: No acute fracture. No primary bone lesion or focal pathologic process. Soft tissues and spinal canal: No prevertebral fluid or swelling. No visible canal hematoma. Disc levels: Multilevel degenerative disc  disease and upper facet degenerative changes, particularly on the left. Upper chest: Negative. Other: No other abnormalities. IMPRESSION: 1. Fluid in the mastoid air cells without bony erosion or overlying soft tissue swelling is of doubtful acute significance. 2. No acute intracranial abnormalities. 3. No fracture or traumatic malalignment in the cervical spine. Degenerative changes. Electronically Signed   By: Dorise Bullion III M.D.   On: 06/26/2021 12:13   CT Angio Chest PE W and/or Wo Contrast  Result Date: 06/26/2021 CLINICAL DATA:  Pain, elevated D-dimer, low/intermediate prob PE suspected EXAM: CT ANGIOGRAPHY CHEST WITH CONTRAST TECHNIQUE: Multidetector CT imaging of the chest was performed using the standard protocol during bolus administration of intravenous contrast. Multiplanar CT image reconstructions and MIPs were obtained to evaluate the vascular anatomy. CONTRAST:  53m OMNIPAQUE IOHEXOL 350 MG/ML SOLN COMPARISON:  01/10/2012 FINDINGS: Cardiovascular: Heart size normal. No pericardial effusion. Satisfactory opacification of pulmonary arteries noted, and there is no evidence of pulmonary emboli. Adequate contrast opacification of the thoracic aorta with no evidence of dissection, aneurysm, or stenosis. There is bovine variant brachiocephalic arch anatomy without proximal stenosis. Scattered calcified plaque in the arch, descending thoracic and visualized proximal abdominal aorta. Mediastinum/Nodes: No mass or adenopathy. Lungs/Pleura: No pleural effusion. No pneumothorax. Extensive mild ground-glass opacities involving upper lobes more than bases, with some geographic areas of sparing peripherally. No discrete nodule or airspace consolidation. Upper Abdomen: Marked heterogeneity of the hepatic parenchyma suggesting low Anterior vertebral endplate spurring at multiple levels in the mid and lower thoracic spine. fatty infiltration, although incompletely characterized on this very early arterial  phase study. Cholecystectomy clips. Bilateral adrenal adenomas. No acute findings. Musculoskeletal: Mild spondylitic changes in the thoracic spine. No fracture or worrisome bone lesion. Review of the MIP images confirms the above findings. IMPRESSION: 1. Negative for acute PE or thoracic aortic dissection. 2. Mild bilateral ground-glass opacities may represent early edema or alveolitis. Electronically Signed   By: DLucrezia EuropeM.D.   On: 06/26/2021 15:08   CT Cervical Spine Wo Contrast  Result Date: 06/26/2021 CLINICAL DATA:  Acute neck pain. No red flags. Pain radiates from fingers to shoulders for 3 weeks. EXAM: CT HEAD WITHOUT CONTRAST CT CERVICAL SPINE WITHOUT CONTRAST TECHNIQUE: Multidetector CT imaging of the head and cervical spine was performed following the standard protocol without intravenous contrast. Multiplanar CT image reconstructions of the cervical spine were also generated. COMPARISON:  July 28, 2013 FINDINGS: CT HEAD FINDINGS Brain: No evidence of acute infarction, hemorrhage, hydrocephalus, extra-axial collection or mass lesion/mass effect. Vascular: Calcified atherosclerosis in the intracranial carotids is mild. Skull: Normal. Negative for fracture or focal lesion. Sinuses/Orbits: Fluid in the posterior mastoid air cells was also present 2014 on the right but is new  on the left. No bony erosion or soft tissue swelling. Middle ears are well aerated. Paranasal sinuses are otherwise unremarkable. Other: None. CT CERVICAL SPINE FINDINGS Alignment: Normal. Skull base and vertebrae: No acute fracture. No primary bone lesion or focal pathologic process. Soft tissues and spinal canal: No prevertebral fluid or swelling. No visible canal hematoma. Disc levels: Multilevel degenerative disc disease and upper facet degenerative changes, particularly on the left. Upper chest: Negative. Other: No other abnormalities. IMPRESSION: 1. Fluid in the mastoid air cells without bony erosion or overlying soft  tissue swelling is of doubtful acute significance. 2. No acute intracranial abnormalities. 3. No fracture or traumatic malalignment in the cervical spine. Degenerative changes. Electronically Signed   By: Dorise Bullion III M.D.   On: 06/26/2021 12:13   DG Chest Portable 1 View  Result Date: 06/26/2021 CLINICAL DATA:  Chest pain. EXAM: PORTABLE CHEST 1 VIEW COMPARISON:  November 13, 2020. FINDINGS: The heart size and mediastinal contours are within normal limits. Both lungs are clear. The visualized skeletal structures are unremarkable. IMPRESSION: No active disease. Electronically Signed   By: Marijo Conception M.D.   On: 06/26/2021 11:37   ECHOCARDIOGRAM COMPLETE  Result Date: 06/27/2021    ECHOCARDIOGRAM REPORT   Patient Name:   DAVIONNE MCEUEN Heidt Date of Exam: 06/27/2021 Medical Rec #:  [id number]         Height:       61.0 in Accession #:    [id number]        Weight:       181.9 lb Date of Birth:  February 22, 1961         BSA:          1.814 m Patient Age:    71 years          BP:           122/75 mmHg Patient Gender: F                 HR:           88 bpm. Exam Location:  Inpatient Procedure: 2D Echo, Cardiac Doppler and Color Doppler Indications:    NSTEMI I21.4  History:        Patient has no prior history of Echocardiogram examinations.                 Risk Factors:Diabetes and Hypertension.  Sonographer:    Bernadene Person RDCS Referring Phys: [id number] [place]  1. Left ventricular ejection fraction, by estimation, is 60 to 65%. The left ventricle has normal function. The left ventricle has no regional wall motion abnormalities. Left ventricular diastolic parameters are consistent with Grade I diastolic dysfunction (impaired relaxation).  2. Right ventricular systolic function is normal. The right ventricular size is normal. Tricuspid regurgitation signal is inadequate for assessing PA pressure.  3. The mitral valve is normal in structure. No evidence of mitral valve regurgitation. No  evidence of mitral stenosis.  4. The aortic valve is tricuspid. Aortic valve regurgitation is not visualized. Mild aortic valve sclerosis is present, with no evidence of aortic valve stenosis.  5. The inferior vena cava is normal in size with greater than 50% respiratory variability, suggesting right atrial pressure of 3 mmHg. FINDINGS  Left Ventricle: Left ventricular ejection fraction, by estimation, is 60 to 65%. The left ventricle has normal function. The left ventricle has no regional wall motion abnormalities. The left ventricular internal cavity size was normal in size. There is  no left ventricular hypertrophy. Left ventricular diastolic parameters are consistent with Grade I diastolic dysfunction (impaired relaxation). Normal left ventricular filling pressure. Right Ventricle: The right ventricular size is normal. No increase in right ventricular wall thickness. Right ventricular systolic function is normal. Tricuspid regurgitation signal is inadequate for assessing PA pressure. Left Atrium: Left atrial size was normal in size. Right Atrium: Right atrial size was normal in size. Pericardium: There is no evidence of pericardial effusion. Mitral Valve: The mitral valve is normal in structure. No evidence of mitral valve regurgitation. No evidence of mitral valve stenosis. Tricuspid Valve: The tricuspid valve is normal in structure. Tricuspid valve regurgitation is not demonstrated. No evidence of tricuspid stenosis. Aortic Valve: The aortic valve is tricuspid. Aortic valve regurgitation is not visualized. Mild aortic valve sclerosis is present, with no evidence of aortic valve stenosis. Pulmonic Valve: The pulmonic valve was normal in structure. Pulmonic valve regurgitation is not visualized. No evidence of pulmonic stenosis. Aorta: The aortic root is normal in size and structure. Venous: The inferior vena cava is normal in size with greater than 50% respiratory variability, suggesting right atrial pressure  of 3 mmHg. IAS/Shunts: No atrial level shunt detected by color flow Doppler.  LEFT VENTRICLE PLAX 2D LVIDd:         4.20 cm  Diastology LVIDs:         3.00 cm  LV e' medial:    7.46 cm/s LV PW:         1.10 cm  LV E/e' medial:  13.8 LV IVS:        1.00 cm  LV e' lateral:   8.25 cm/s LVOT diam:     1.90 cm  LV E/e' lateral: 12.5 LV SV:         64 LV SV Index:   35 LVOT Area:     2.84 cm  RIGHT VENTRICLE RV S prime:     12.00 cm/s TAPSE (M-mode): 1.6 cm LEFT ATRIUM             Index       RIGHT ATRIUM           Index LA diam:        2.80 cm 1.54 cm/m  RA Area:     12.90 cm LA Vol (A2C):   35.1 ml 19.35 ml/m RA Volume:   29.40 ml  16.21 ml/m LA Vol (A4C):   36.1 ml 19.90 ml/m LA Biplane Vol: 36.0 ml 19.85 ml/m  AORTIC VALVE LVOT Vmax:   115.00 cm/s LVOT Vmean:  77.600 cm/s LVOT VTI:    0.226 m  AORTA Ao Root diam: 2.70 cm Ao Asc diam:  2.70 cm MITRAL VALVE MV Area (PHT): 4.06 cm     SHUNTS MV Decel Time: 187 msec     Systemic VTI:  0.23 m MV E velocity: 103.00 cm/s  Systemic Diam: 1.90 cm MV A velocity: 116.00 cm/s MV E/A ratio:  0.89 Fransico Him MD Electronically signed by Fransico Him MD Signature Date/Time: 06/27/2021/10:20:53 AM    Final     Cardiac Studies   Echo: 06/27/21  IMPRESSIONS     1. Left ventricular ejection fraction, by estimation, is 60 to 65%. The  left ventricle has normal function. The left ventricle has no regional  wall motion abnormalities. Left ventricular diastolic parameters are  consistent with Grade I diastolic  dysfunction (impaired relaxation).   2. Right ventricular systolic function is normal. The right ventricular  size is normal. Tricuspid  regurgitation signal is inadequate for assessing  PA pressure.   3. The mitral valve is normal in structure. No evidence of mitral valve  regurgitation. No evidence of mitral stenosis.   4. The aortic valve is tricuspid. Aortic valve regurgitation is not  visualized. Mild aortic valve sclerosis is present, with no evidence  of  aortic valve stenosis.   5. The inferior vena cava is normal in size with greater than 50%  respiratory variability, suggesting right atrial pressure of 3 mmHg.   Patient Profile     60 y.o. female PMH type II diabetes, obesity, hyperlipidemia and tobacco abuse presenting with NSTEMI  Assessment & Plan    NSTEMI: hsTn peaked at 4670. Planned for cardiac cath today. Feels anxious about procedure, no chest pain but does have tingling in her right arm. Echo with normal EF with no rWMA.  -- remains on IV heparin, asa, statin, BB  DM: Hgb A1c 10.4 -- reports PCP manages her insulin, but she has been having trouble regulating CBGs well -- on SSI -- plan to add SGLT2  HLD: LDL 105 -- started ib atorva '80mg'$  daily  Tobacco use: cessation advised -- nicotine patch  Anxiety: continue on home regimen -- ativan 0.'5mg'$  qid prn -- buspirone 7.'5mg'$  TID    For questions or updates, please contact [facility] Please consult [email or web address] for contact info under        Signed, Reino Bellis, NP  06/28/2021, 8:04 AM

## 2021-06-28 NOTE — Progress Notes (Signed)
Inpatient Diabetes Program Recommendations  AACE/ADA: New Consensus Statement on Inpatient Glycemic Control (2015)  Target Ranges:  Prepandial:   less than 140 mg/dL      Peak postprandial:   less than 180 mg/dL (1-2 hours)      Critically ill patients:  140 - 180 mg/dL   Lab Results  Component Value Date   GLUCAP 110 (H) 06/28/2021   HGBA1C 10.4 (H) 06/26/2021    Review of Glycemic Control  Diabetes history: DM2 Outpatient Diabetes medications: Tresiba 40 qd + Nov 2 tid Current orders for Inpatient glycemic control: Semglee 40+ Nov 2 tid + 0-9 tid.   Inpatient Diabetes Program Recommendations:   Discharge needs: -Patient requests referral to see Dr. Dorris Fetch, endocrinologist in [place] as outpatient -Cardiac Rehab if qualifies after discharge  Agree with adding SGLT-2 on discharge. May also consider adding Metformin on discharge if patient has not been on in the past.  Spoke with pt and son about A1C results 10.4 (average blood glucose 252 over the past 2-3 months) and explained what an A1C is, basic pathophysiology of DM Type 2, basic home care, basic diabetes diet nutrition principles, importance of checking CBGs and maintaining good CBG control to prevent long-term and short-term complications. Reviewed signs and symptoms of hyperglycemia and hypoglycemia and how to treat hypoglycemia at home. Also reviewed blood sugar goals at home.  Reviewed basic plate method and nutrition with patient regarding limiting carbohydrates and sugary drinks. Patient drinks up to 12 ZERO Coke per day and states some days she drinks the sodas but does not drink water. Encouraged to drink water as well. Received Living Well With Diabetes booklet. Reviewed increased risks with elevated CBGs and also discussed exercise and walking. Patient acknowledged concern and willingness to make changes to improve her health.  Thank you, Nani Gasser. Bryttney Netzer, RN, MSN, CDE  Diabetes Coordinator Inpatient Glycemic  Control Team Team Pager [phone] (8am-5pm) 06/28/2021 3:13 PM

## 2021-06-29 ENCOUNTER — Other Ambulatory Visit (HOSPITAL_COMMUNITY): Payer: Self-pay

## 2021-06-29 DIAGNOSIS — I25110 Atherosclerotic heart disease of native coronary artery with unstable angina pectoris: Secondary | ICD-10-CM | POA: Clinically undetermined

## 2021-06-29 DIAGNOSIS — Z9861 Coronary angioplasty status: Secondary | ICD-10-CM

## 2021-06-29 DIAGNOSIS — Z720 Tobacco use: Secondary | ICD-10-CM | POA: Diagnosis present

## 2021-06-29 DIAGNOSIS — I2510 Atherosclerotic heart disease of native coronary artery without angina pectoris: Secondary | ICD-10-CM | POA: Diagnosis not present

## 2021-06-29 DIAGNOSIS — E118 Type 2 diabetes mellitus with unspecified complications: Secondary | ICD-10-CM

## 2021-06-29 DIAGNOSIS — E785 Hyperlipidemia, unspecified: Secondary | ICD-10-CM | POA: Diagnosis present

## 2021-06-29 DIAGNOSIS — E1169 Type 2 diabetes mellitus with other specified complication: Secondary | ICD-10-CM | POA: Diagnosis present

## 2021-06-29 DIAGNOSIS — I214 Non-ST elevation (NSTEMI) myocardial infarction: Secondary | ICD-10-CM | POA: Diagnosis not present

## 2021-06-29 LAB — BASIC METABOLIC PANEL
Anion gap: 9 (ref 5–15)
BUN: 13 mg/dL (ref 8–23)
CO2: 23 mmol/L (ref 22–32)
Calcium: 8.7 mg/dL — ABNORMAL LOW (ref 8.9–10.3)
Chloride: 107 mmol/L (ref 98–111)
Creatinine, Ser: 1.13 mg/dL — ABNORMAL HIGH (ref 0.44–1.00)
GFR, Estimated: 55 mL/min — ABNORMAL LOW (ref >60)
Glucose, Bld: 201 mg/dL — ABNORMAL HIGH (ref 70–99)
Potassium: 3.7 mmol/L (ref 3.5–5.1)
Sodium: 139 mmol/L (ref 135–145)

## 2021-06-29 LAB — CBC
HCT: 39.9 % (ref 36.0–46.0)
Hemoglobin: 13.2 g/dL (ref 12.0–15.0)
MCH: 31.6 pg (ref 26.0–34.0)
MCHC: 33.1 g/dL (ref 30.0–36.0)
MCV: 95.5 fL (ref 80.0–100.0)
Platelets: 214 10*3/uL (ref 150–400)
RBC: 4.18 MIL/uL (ref 3.87–5.11)
RDW: 12.7 % (ref 11.5–15.5)
WBC: 10.3 10*3/uL (ref 4.0–10.5)
nRBC: 0 % (ref 0.0–0.2)

## 2021-06-29 LAB — GLUCOSE, CAPILLARY
Glucose-Capillary: 188 mg/dL — ABNORMAL HIGH (ref 70–99)
Glucose-Capillary: 277 mg/dL — ABNORMAL HIGH (ref 70–99)

## 2021-06-29 MED ORDER — ATORVASTATIN CALCIUM 80 MG PO TABS
80.0000 mg | ORAL_TABLET | Freq: Every day | ORAL | 1 refills | Status: DC
  Filled 2021-06-29: qty 90, 90d supply, fill #0

## 2021-06-29 MED ORDER — ASPIRIN 81 MG PO TBEC
81.0000 mg | DELAYED_RELEASE_TABLET | Freq: Every day | ORAL | 2 refills | Status: DC
  Filled 2021-06-29: qty 90, 90d supply, fill #0

## 2021-06-29 MED ORDER — ALPRAZOLAM 0.5 MG PO TABS: 0.5000 mg | ORAL_TABLET | Freq: Four times a day (QID) | ORAL | Status: DC

## 2021-06-29 MED ORDER — ISOSORBIDE MONONITRATE ER 30 MG PO TB24: 30.0000 mg | ORAL_TABLET | Freq: Every day | ORAL | 0 refills | Status: DC

## 2021-06-29 MED ORDER — NITROGLYCERIN 0.4 MG SL SUBL
0.4000 mg | SUBLINGUAL_TABLET | SUBLINGUAL | 2 refills | Status: DC | PRN
  Filled 2021-06-29: qty 25, 8d supply, fill #0

## 2021-06-29 MED ORDER — TICAGRELOR 90 MG PO TABS
90.0000 mg | ORAL_TABLET | Freq: Two times a day (BID) | ORAL | 2 refills | Status: DC
  Filled 2021-06-29: qty 180, 90d supply, fill #0

## 2021-06-29 MED ORDER — EMPAGLIFLOZIN 10 MG PO TABS
10.0000 mg | ORAL_TABLET | Freq: Every day | ORAL | 11 refills | Status: DC
  Filled 2021-06-29: qty 30, 30d supply, fill #0

## 2021-06-29 MED ORDER — NICOTINE 21 MG/24HR TD PT24
21.0000 mg | MEDICATED_PATCH | Freq: Every day | TRANSDERMAL | 0 refills | Status: DC
  Filled 2021-06-29: qty 28, 28d supply, fill #0

## 2021-06-29 MED ORDER — METOPROLOL TARTRATE 25 MG PO TABS
12.5000 mg | ORAL_TABLET | Freq: Two times a day (BID) | ORAL | 0 refills | Status: DC
  Filled 2021-06-29: qty 90, 90d supply, fill #0

## 2021-06-29 NOTE — TOC Benefit Eligibility Note (Signed)
Patient Teacher, English as a foreign language completed.    The patient is currently admitted and upon discharge could be taking Brilinta 90 mg.  The current 30 day co-pay is, $0.00.   The patient is currently admitted and upon discharge could be taking Jardiance 10 mg.  The current 30 day co-pay is, $0.00.   The patient is currently admitted and upon discharge could be taking Farxiga 10 mg.  The current 30 day co-pay is, $0.00.   The patient is insured through [employer] Complete Medicare Part D/[place] Medicaid    Lyndel Safe, [place] Patient Advocate Specialist [place] Team Direct Number: [phone]  Fax: [phone]

## 2021-06-29 NOTE — Discharge Summary (Addendum)
Discharge Summary    Patient ID: Katelyn Kelly MRN: [id number]; DOB: 1961-04-23  Admit date: 06/26/2021 Discharge date: 06/29/2021  PCP:  Nickola Major, MD   [facility] Providers Cardiologist:  Carlyle Dolly, MD   ; or Dr. Harrell Gave.  Discharge Diagnoses    Principal Problem:   NSTEMI (non-ST elevated myocardial infarction) ([place]) Active Problems:   CAD S/P percutaneous coronary angioplasty of OM1   Coronary artery disease involving native coronary artery of native heart with unstable angina pectoris ([place])   Hyperlipidemia associated with type 2 diabetes mellitus ([place])   Type 2 diabetes mellitus with complication, with long-term current use of insulin ([facility])   Tobacco use    Diagnostic Studies/Procedures    Cath: 06/28/21  Ramus lesion is 95% stenosed.   Balloon angioplasty was performed using a BALLOON SAPPHIRE 2.0X12.   Post intervention, there is a 10% residual stenosis.   1.  Single-vessel coronary artery disease with severe thrombotic stenosis at the bifurcation of the intermediate branch, treated successfully with balloon angioplasty using a 2 mm balloon.  Intermediate subbranch occlusion noted following angioplasty, likely from distal embolization 2.  Patent left main, LAD, AV circumflex, and RCA without significant stenosis 3.  Normal LV function evaluated by echo with normal LVEDP   Recommend: Aggressive medical therapy, tobacco cessation, dual antiplatelet therapy with aspirin and ticagrelor as tolerated for 12 months for treatment of ACS/non-STEMI  Diagnostic Dominance: Right Intervention    Echo: 06/27/21  IMPRESSIONS     1. Left ventricular ejection fraction, by estimation, is 60 to 65%. The  left ventricle has normal function. The left ventricle has no regional  wall motion abnormalities. Left ventricular diastolic parameters are  consistent with Grade I diastolic  dysfunction (impaired relaxation).   2. Right ventricular systolic  function is normal. The right ventricular  size is normal. Tricuspid regurgitation signal is inadequate for assessing  PA pressure.   3. The mitral valve is normal in structure. No evidence of mitral valve  regurgitation. No evidence of mitral stenosis.   4. The aortic valve is tricuspid. Aortic valve regurgitation is not  visualized. Mild aortic valve sclerosis is present, with no evidence of  aortic valve stenosis.   5. The inferior vena cava is normal in size with greater than 50%  respiratory variability, suggesting right atrial pressure of 3 mmHg.   FINDINGS   Left Ventricle: Left ventricular ejection fraction, by estimation, is 60  to 65%. The left ventricle has normal function. The left ventricle has no  regional wall motion abnormalities. The left ventricular internal cavity  size was normal in size. There is   no left ventricular hypertrophy. Left ventricular diastolic parameters  are consistent with Grade I diastolic dysfunction (impaired relaxation).  Normal left ventricular filling pressure.   Right Ventricle: The right ventricular size is normal. No increase in  right ventricular wall thickness. Right ventricular systolic function is  normal. Tricuspid regurgitation signal is inadequate for assessing PA  pressure.   Left Atrium: Left atrial size was normal in size.   Right Atrium: Right atrial size was normal in size.   Pericardium: There is no evidence of pericardial effusion.   Mitral Valve: The mitral valve is normal in structure. No evidence of  mitral valve regurgitation. No evidence of mitral valve stenosis.   Tricuspid Valve: The tricuspid valve is normal in structure. Tricuspid  valve regurgitation is not demonstrated. No evidence of tricuspid  stenosis.   Aortic Valve: The aortic valve is  tricuspid. Aortic valve regurgitation is  not visualized. Mild aortic valve sclerosis is present, with no evidence  of aortic valve stenosis.   Pulmonic Valve: The  pulmonic valve was normal in structure. Pulmonic valve  regurgitation is not visualized. No evidence of pulmonic stenosis.   Aorta: The aortic root is normal in size and structure.   Venous: The inferior vena cava is normal in size with greater than 50%  respiratory variability, suggesting right atrial pressure of 3 mmHg.   IAS/Shunts: No atrial level shunt detected by color flow Doppler.  _____________   History of Present Illness     Katelyn Kelly is a 60 y.o. female with DM2, MDD and GERD who presents with b/l arm pain that started 2 weeks ago and worsened in severity now admitted for NSTEMI management.   Katelyn Kelly had right hand pain with radiation to her arm and neck which started around 2 weeks ago.  She can reproduce these painful sensations with certain movements of her wrist/hands.  She does have a history of carpal tunnel syndrome in the past.  She woke up on 08/20 7 AM with significant bilateral arm pain and associated chest pain with radiation to the jaw.  Initially she thought that the arm pain may have been similar to prior episodes in her right arm with electric pain and tingling like her arms were asleep.  She became more worried however when she noticed fairly significant chest discomfort that was 10/10 severity and not typical of her prior symptoms.  She has not type person that normally goes to the hospital but called EMS because the pain was so severe.  She is a longtime smoker of at least 1 and more recently 2 PPD.  She has uncontrolled diabetes, obesity, HTN, and HLD.  She was transported to [facility] ED where she was diagnosed with NSTEMI and transported to [facility] for further evaluation.  She had received aspirin 324 mg p.o. x1 along with morphine prior to transport.  Her initial vital signs were unremarkable.   BP 120/66 P 91 T 36.6 RR 14 SpO2 99%   Meds received on transfer.    Heparin 4000 units (1243)  Toradol 30 mg IV (1202) Morphine 4 mg IV (1120) Zofran 4  mg IV (1120)  Heparin gtt at 800/h (1235)  SLNG 0.4 mg x1 (1203)    Morphine 4 mg IV and ASA 324 mg PO by EMS prior to arrival    Labs sCr (0.99) hsT (722)->4670 BNP (16)  Hb (15.2), plt (251) D-dimer (0.91) BG (302)     Hospital Course     NSTEMI/CAD-PTCA: hsTn peaked at 4670. Treated with IV heparin. Underwent cardiac cath noted above with single vessel CAD with severe thrombotic stenosis at bifurcation of intermediate branch. Treated with PCI angioplasty. Did have intermediate subbranch occlusion after angioplasty from distal embolization. Recommendations for DAPT with ASA/Brilinat for at least one year, along with aggressive medical therapy. Follow up echo showed EF of 60-65% with no rWMA noted. Seen by CR and ambulated without recurrent chest pain. -- continue ASA, Brilinta, statin, BB  Adding low-dose Imdur as she has had some intermittent chest discomfort off and on.  I am concerned about possible recoil/spasm in the PTCA site. Dissipate potential titration of beta-blocker plus minus adding ARB on discharge.  With her diabetes, ARB would likely be a good option.  Can be initiated in the outpatient setting.   DM-2-poorly controlled with hyperglycemia.: Hgb A1c 10.4 -poorly controlled diabetes  mellitus, type II being managed by PCP at home. -- reports PCP manages her insulin, but she has been having trouble regulating CBGs well -- will resume home insulin and added jardiance  -- follow up with PCP, may benefit from outpatient endocrinology as well   HLD: LDL 105; goal is less than 70 -- started on atorva '80mg'$  daily -- FLP/LFTs in 8 weeks   Tobacco use: cessation advised -- nicotine patch   Anxiety: continue on home regimen -- ativan 0.'5mg'$  qid  -- buspirone 7.'5mg'$  TID  General: Well developed, well nourished, female appearing in no acute distress. Head: Normocephalic, atraumatic.  Neck: Supple without bruits, JVD. Lungs:  Resp regular and unlabored, CTA. Heart: RRR, S1,  S2, no S3, S4, or murmur; no rub. Abdomen: Soft, non-tender, non-distended with normoactive bowel sounds. Extremities: No clubbing, cyanosis, edema. Distal pedal pulses are 2+ bilaterally. Right cath site stable without bruising or hematoma Neuro: Alert and oriented X 3. Moves all extremities spontaneously. Psych: Normal affect.  Patient was seen by Dr. Ellyn Hack and deemed stable for discharge home. Follow up in the office has been arranged. Medications sent to [facility]. Educated by PharmD prior to discharge.   Did the patient have an acute coronary syndrome (MI, NSTEMI, STEMI, etc) this admission?:  Yes                               AHA/ACC Clinical Performance & Quality Measures: Aspirin prescribed? - Yes ADP Receptor Inhibitor (Plavix/Clopidogrel, Brilinta/Ticagrelor or Effient/Prasugrel) prescribed (includes medically managed patients)? - Yes Beta Blocker prescribed? - Yes High Intensity Statin (Lipitor 40-'80mg'$  or Crestor 20-'40mg'$ ) prescribed? - Yes EF assessed during THIS hospitalization? - Yes For EF <40%, was ACEI/ARB prescribed? - Not Applicable (EF >/= [id number]) For EF <40%, Aldosterone Antagonist (Spironolactone or Eplerenone) prescribed? - Not Applicable (EF >/= [id number]) Cardiac Rehab Phase II ordered (including medically managed patients)? - Yes      _____________  Discharge Vitals Blood pressure (!) 140/92, pulse 88, temperature 98.5 F (36.9 C), temperature source Oral, resp. rate 16, height '5\' 1"'$  (1.549 m), weight 81.5 kg, SpO2 95 %.  Filed Weights   06/26/21 1740 06/27/21 0450 06/28/21 0500  Weight: 82.2 kg 82.5 kg 81.5 kg    Labs & Radiologic Studies    CBC Recent Labs    06/28/21 0050 06/29/21 0156  WBC 10.4 10.3  HGB 14.3 13.2  HCT 43.0 39.9  MCV 95.6 95.5  PLT 216 [id number]   Basic Metabolic Panel Recent Labs    06/29/21 0156  NA 139  K 3.7  CL 107  CO2 23  GLUCOSE 201*  BUN 13  CREATININE 1.13*  CALCIUM 8.7*   Liver Function Tests No results for  input(s): AST, ALT, ALKPHOS, BILITOT, PROT, ALBUMIN in the last 72 hours. No results for input(s): LIPASE, AMYLASE in the last 72 hours. High Sensitivity Troponin:   Recent Labs  Lab 06/26/21 1050 06/26/21 1243  TROPONINIHS 722* 4,670*    BNP Invalid input(s): POCBNP D-Dimer No results for input(s): DDIMER in the last 72 hours.  Hemoglobin A1C Recent Labs    06/26/21 1827  HGBA1C 10.4*   Fasting Lipid Panel Recent Labs    06/26/21 1827  CHOL 193  HDL 56  LDLCALC 105*  TRIG 158*  CHOLHDL 3.4   Thyroid Function Tests Recent Labs    06/26/21 1827  TSH 1.518   _____________  CT HEAD WO CONTRAST (5MM)  Result Date: 06/26/2021 CLINICAL DATA:  Acute neck pain. No red flags. Pain radiates from fingers to shoulders for 3 weeks. EXAM: CT HEAD WITHOUT CONTRAST CT CERVICAL SPINE WITHOUT CONTRAST TECHNIQUE: Multidetector CT imaging of the head and cervical spine was performed following the standard protocol without intravenous contrast. Multiplanar CT image reconstructions of the cervical spine were also generated. COMPARISON:  July 28, 2013 FINDINGS: CT HEAD FINDINGS Brain: No evidence of acute infarction, hemorrhage, hydrocephalus, extra-axial collection or mass lesion/mass effect. Vascular: Calcified atherosclerosis in the intracranial carotids is mild. Skull: Normal. Negative for fracture or focal lesion. Sinuses/Orbits: Fluid in the posterior mastoid air cells was also present 2014 on the right but is new on the left. No bony erosion or soft tissue swelling. Middle ears are well aerated. Paranasal sinuses are otherwise unremarkable. Other: None. CT CERVICAL SPINE FINDINGS Alignment: Normal. Skull base and vertebrae: No acute fracture. No primary bone lesion or focal pathologic process. Soft tissues and spinal canal: No prevertebral fluid or swelling. No visible canal hematoma. Disc levels: Multilevel degenerative disc disease and upper facet degenerative changes, particularly on  the left. Upper chest: Negative. Other: No other abnormalities. IMPRESSION: 1. Fluid in the mastoid air cells without bony erosion or overlying soft tissue swelling is of doubtful acute significance. 2. No acute intracranial abnormalities. 3. No fracture or traumatic malalignment in the cervical spine. Degenerative changes. Electronically Signed   By: Dorise Bullion III M.D.   On: 06/26/2021 12:13   CT Angio Chest PE W and/or Wo Contrast  Result Date: 06/26/2021 CLINICAL DATA:  Pain, elevated D-dimer, low/intermediate prob PE suspected EXAM: CT ANGIOGRAPHY CHEST WITH CONTRAST TECHNIQUE: Multidetector CT imaging of the chest was performed using the standard protocol during bolus administration of intravenous contrast. Multiplanar CT image reconstructions and MIPs were obtained to evaluate the vascular anatomy. CONTRAST:  98m OMNIPAQUE IOHEXOL 350 MG/ML SOLN COMPARISON:  01/10/2012 FINDINGS: Cardiovascular: Heart size normal. No pericardial effusion. Satisfactory opacification of pulmonary arteries noted, and there is no evidence of pulmonary emboli. Adequate contrast opacification of the thoracic aorta with no evidence of dissection, aneurysm, or stenosis. There is bovine variant brachiocephalic arch anatomy without proximal stenosis. Scattered calcified plaque in the arch, descending thoracic and visualized proximal abdominal aorta. Mediastinum/Nodes: No mass or adenopathy. Lungs/Pleura: No pleural effusion. No pneumothorax. Extensive mild ground-glass opacities involving upper lobes more than bases, with some geographic areas of sparing peripherally. No discrete nodule or airspace consolidation. Upper Abdomen: Marked heterogeneity of the hepatic parenchyma suggesting low Anterior vertebral endplate spurring at multiple levels in the mid and lower thoracic spine. fatty infiltration, although incompletely characterized on this very early arterial phase study. Cholecystectomy clips. Bilateral adrenal adenomas.  No acute findings. Musculoskeletal: Mild spondylitic changes in the thoracic spine. No fracture or worrisome bone lesion. Review of the MIP images confirms the above findings. IMPRESSION: 1. Negative for acute PE or thoracic aortic dissection. 2. Mild bilateral ground-glass opacities may represent early edema or alveolitis. Electronically Signed   By: DLucrezia EuropeM.D.   On: 06/26/2021 15:08   CT Cervical Spine Wo Contrast  Result Date: 06/26/2021 CLINICAL DATA:  Acute neck pain. No red flags. Pain radiates from fingers to shoulders for 3 weeks. EXAM: CT HEAD WITHOUT CONTRAST CT CERVICAL SPINE WITHOUT CONTRAST TECHNIQUE: Multidetector CT imaging of the head and cervical spine was performed following the standard protocol without intravenous contrast. Multiplanar CT image reconstructions of the cervical spine were also generated. COMPARISON:  July 28, 2013 FINDINGS: CT HEAD FINDINGS  Brain: No evidence of acute infarction, hemorrhage, hydrocephalus, extra-axial collection or mass lesion/mass effect. Vascular: Calcified atherosclerosis in the intracranial carotids is mild. Skull: Normal. Negative for fracture or focal lesion. Sinuses/Orbits: Fluid in the posterior mastoid air cells was also present 2014 on the right but is new on the left. No bony erosion or soft tissue swelling. Middle ears are well aerated. Paranasal sinuses are otherwise unremarkable. Other: None. CT CERVICAL SPINE FINDINGS Alignment: Normal. Skull base and vertebrae: No acute fracture. No primary bone lesion or focal pathologic process. Soft tissues and spinal canal: No prevertebral fluid or swelling. No visible canal hematoma. Disc levels: Multilevel degenerative disc disease and upper facet degenerative changes, particularly on the left. Upper chest: Negative. Other: No other abnormalities. IMPRESSION: 1. Fluid in the mastoid air cells without bony erosion or overlying soft tissue swelling is of doubtful acute significance. 2. No acute  intracranial abnormalities. 3. No fracture or traumatic malalignment in the cervical spine. Degenerative changes. Electronically Signed   By: Dorise Bullion III M.D.   On: 06/26/2021 12:13   CARDIAC CATHETERIZATION  Result Date: 06/28/2021   Ramus lesion is 95% stenosed.   Balloon angioplasty was performed using a BALLOON SAPPHIRE 2.0X12.   Post intervention, there is a 10% residual stenosis. 1.  Single-vessel coronary artery disease with severe thrombotic stenosis at the bifurcation of the intermediate branch, treated successfully with balloon angioplasty using a 2 mm balloon.  Intermediate subbranch occlusion noted following angioplasty, likely from distal embolization 2.  Patent left main, LAD, AV circumflex, and RCA without significant stenosis 3.  Normal LV function evaluated by echo with normal LVEDP Recommend: Aggressive medical therapy, tobacco cessation, dual antiplatelet therapy with aspirin and ticagrelor as tolerated for 12 months for treatment of ACS/non-STEMI   DG Chest Portable 1 View  Result Date: 06/26/2021 CLINICAL DATA:  Chest pain. EXAM: PORTABLE CHEST 1 VIEW COMPARISON:  November 13, 2020. FINDINGS: The heart size and mediastinal contours are within normal limits. Both lungs are clear. The visualized skeletal structures are unremarkable. IMPRESSION: No active disease. Electronically Signed   By: Marijo Conception M.D.   On: 06/26/2021 11:37   ECHOCARDIOGRAM COMPLETE  Result Date: 06/27/2021    ECHOCARDIOGRAM REPORT   Patient Name:   Katelyn Kelly Date of Exam: 06/27/2021 Medical Rec #:  [id number]         Height:       61.0 in Accession #:    [id number]        Weight:       181.9 lb Date of Birth:  September 02, 1961         BSA:          1.814 m Patient Age:    1 years          BP:           122/75 mmHg Patient Gender: F                 HR:           88 bpm. Exam Location:  Inpatient Procedure: 2D Echo, Cardiac Doppler and Color Doppler Indications:    NSTEMI I21.4  History:        Patient  has no prior history of Echocardiogram examinations.                 Risk Factors:Diabetes and Hypertension.  Sonographer:    Bernadene Person RDCS Referring Phys: [id number] [place]  1. Left ventricular ejection  fraction, by estimation, is 60 to 65%. The left ventricle has normal function. The left ventricle has no regional wall motion abnormalities. Left ventricular diastolic parameters are consistent with Grade I diastolic dysfunction (impaired relaxation).  2. Right ventricular systolic function is normal. The right ventricular size is normal. Tricuspid regurgitation signal is inadequate for assessing PA pressure.  3. The mitral valve is normal in structure. No evidence of mitral valve regurgitation. No evidence of mitral stenosis.  4. The aortic valve is tricuspid. Aortic valve regurgitation is not visualized. Mild aortic valve sclerosis is present, with no evidence of aortic valve stenosis.  5. The inferior vena cava is normal in size with greater than 50% respiratory variability, suggesting right atrial pressure of 3 mmHg. FINDINGS  Left Ventricle: Left ventricular ejection fraction, by estimation, is 60 to 65%. The left ventricle has normal function. The left ventricle has no regional wall motion abnormalities. The left ventricular internal cavity size was normal in size. There is  no left ventricular hypertrophy. Left ventricular diastolic parameters are consistent with Grade I diastolic dysfunction (impaired relaxation). Normal left ventricular filling pressure. Right Ventricle: The right ventricular size is normal. No increase in right ventricular wall thickness. Right ventricular systolic function is normal. Tricuspid regurgitation signal is inadequate for assessing PA pressure. Left Atrium: Left atrial size was normal in size. Right Atrium: Right atrial size was normal in size. Pericardium: There is no evidence of pericardial effusion. Mitral Valve: The mitral valve is normal in  structure. No evidence of mitral valve regurgitation. No evidence of mitral valve stenosis. Tricuspid Valve: The tricuspid valve is normal in structure. Tricuspid valve regurgitation is not demonstrated. No evidence of tricuspid stenosis. Aortic Valve: The aortic valve is tricuspid. Aortic valve regurgitation is not visualized. Mild aortic valve sclerosis is present, with no evidence of aortic valve stenosis. Pulmonic Valve: The pulmonic valve was normal in structure. Pulmonic valve regurgitation is not visualized. No evidence of pulmonic stenosis. Aorta: The aortic root is normal in size and structure. Venous: The inferior vena cava is normal in size with greater than 50% respiratory variability, suggesting right atrial pressure of 3 mmHg. IAS/Shunts: No atrial level shunt detected by color flow Doppler.  LEFT VENTRICLE PLAX 2D LVIDd:         4.20 cm  Diastology LVIDs:         3.00 cm  LV e' medial:    7.46 cm/s LV PW:         1.10 cm  LV E/e' medial:  13.8 LV IVS:        1.00 cm  LV e' lateral:   8.25 cm/s LVOT diam:     1.90 cm  LV E/e' lateral: 12.5 LV SV:         64 LV SV Index:   35 LVOT Area:     2.84 cm  RIGHT VENTRICLE RV S prime:     12.00 cm/s TAPSE (M-mode): 1.6 cm LEFT ATRIUM             Index       RIGHT ATRIUM           Index LA diam:        2.80 cm 1.54 cm/m  RA Area:     12.90 cm LA Vol (A2C):   35.1 ml 19.35 ml/m RA Volume:   29.40 ml  16.21 ml/m LA Vol (A4C):   36.1 ml 19.90 ml/m LA Biplane Vol: 36.0 ml 19.85 ml/m  AORTIC VALVE LVOT  Vmax:   115.00 cm/s LVOT Vmean:  77.600 cm/s LVOT VTI:    0.226 m  AORTA Ao Root diam: 2.70 cm Ao Asc diam:  2.70 cm MITRAL VALVE MV Area (PHT): 4.06 cm     SHUNTS MV Decel Time: 187 msec     Systemic VTI:  0.23 m MV E velocity: 103.00 cm/s  Systemic Diam: 1.90 cm MV A velocity: 116.00 cm/s MV E/A ratio:  0.89 Fransico Him MD Electronically signed by Fransico Him MD Signature Date/Time: 06/27/2021/10:20:53 AM    Final    Disposition   Pt is being discharged  home today in good condition.  Follow-up Plans & Appointments     Follow-up Information     Isaiah Serge, NP Follow up on 07/14/2021.   Specialties: Cardiology, Radiology Why: at 3:30pm for your follow up appt Contact information: [address] [place] [address] [phone]                Discharge Instructions     Amb Referral to Cardiac Rehabilitation   Complete by: As directed    Diagnosis:  NSTEMI PTCA     After initial evaluation and assessments completed: Virtual Based Care may be provided alone or in conjunction with Phase 2 Cardiac Rehab based on patient barriers.: Yes   Call MD for:  difficulty breathing, headache or visual disturbances   Complete by: As directed    Call MD for:  redness, tenderness, or signs of infection (pain, swelling, redness, odor or green/yellow discharge around incision site)   Complete by: As directed    Diet - low sodium heart healthy   Complete by: As directed    Discharge instructions   Complete by: As directed    Radial Site Care Refer to this sheet in the next few weeks. These instructions provide you with information on caring for yourself after your procedure. Your caregiver may also give you more specific instructions. Your treatment has been planned according to current medical practices, but problems sometimes occur. Call your caregiver if you have any problems or questions after your procedure. HOME CARE INSTRUCTIONS You may shower the day after the procedure. Remove the bandage (dressing) and gently wash the site with plain soap and water. Gently pat the site dry.  Do not apply powder or lotion to the site.  Do not submerge the affected site in water for 3 to 5 days.  Inspect the site at least twice daily.  Do not flex or bend the affected arm for 24 hours.  No lifting over 5 pounds (2.3 kg) for 5 days after your procedure.  Do not drive home if you are discharged the same day of the procedure. Have someone else drive  you.  You may drive 24 hours after the procedure unless otherwise instructed by your caregiver.  What to expect: Any bruising will usually fade within 1 to 2 weeks.  Blood that collects in the tissue (hematoma) may be painful to the touch. It should usually decrease in size and tenderness within 1 to 2 weeks.  SEEK IMMEDIATE MEDICAL CARE IF: You have unusual pain at the radial site.  You have redness, warmth, swelling, or pain at the radial site.  You have drainage (other than a small amount of blood on the dressing).  You have chills.  You have a fever or persistent symptoms for more than 72 hours.  You have a fever and your symptoms suddenly get worse.  Your arm becomes pale, cool,  tingly, or numb.  You have heavy bleeding from the site. Hold pressure on the site.   PLEASE DO NOT MISS ANY DOSES OF YOUR BRILINTIA!!!!! Also keep a log of you blood pressures and bring back to your follow up appt. Please call the office with any questions.   Patients taking blood thinners should generally stay away from medicines like ibuprofen, Advil, Motrin, naproxen, and Aleve due to risk of stomach bleeding. You may take Tylenol as directed or talk to your primary doctor about alternatives.  PLEASE ENSURE THAT YOU DO NOT RUN OUT OF YOUR BRILINTA. This medication is very important to remain on for at least one year. IF you have issues obtaining this medication due to cost please CALL the office 3-5 business days prior to running out in order to prevent missing doses of this medication.   Increase activity slowly   Complete by: As directed        Discharge Medications   Allergies as of 06/29/2021       Reactions   Meprobamate Hives, Swelling   Sinequan [doxepin Hcl] Anaphylaxis   Tranxene [clorazepate Dipotassium] Hives, Swelling   Albuterol Other (See Comments)   Patient does not remember the reaction. This was a record provided via [facility]   Haloperidol Other (See Comments)   Patient  does not remember the reaction. This was a record provided via [facility]   Sertraline Other (See Comments)   Patient does not remember the reaction. This was a record provided via [facility] Other (See Comments)   Patient does not remember the reaction. This was a record provided via [facility]        Medication List     STOP taking these medications    ketorolac 10 MG tablet Commonly known as: TORADOL   SUMAtriptan 50 MG tablet Commonly known as: IMITREX       TAKE these medications    ALPRAZolam 0.5 MG tablet Commonly known as: Xanax Take 1 tablet (0.5 mg total) by mouth in the morning, at noon, in the evening, and at bedtime.   Aspirin Low Dose 81 MG EC tablet Generic drug: aspirin Take 1 tablet (81 mg total) by mouth daily. Swallow whole.   atorvastatin 80 MG tablet Commonly known as: LIPITOR Take 1 tablet (80 mg total) by mouth at bedtime.   Biofreeze 10 % Liqd Generic drug: Menthol (Topical Analgesic) Apply 1 application topically daily as needed (pain).   Brilinta 90 MG Tabs tablet Generic drug: ticagrelor Take 1 tablet (90 mg total) by mouth 2 (two) times daily.   busPIRone 7.5 MG tablet Commonly known as: BUSPAR Take by mouth See admin instructions. Take 0.5 tablets (3.75 mg total) by mouth 3 times daily for 7 days, THEN 1 tablet (7.5 mg total) 3 times daily for 7 days, THEN 2 tablets (15 mg total) 3 times daily   cyclobenzaprine 5 MG tablet Commonly known as: FLEXERIL Take 5-10 mg by mouth 3 (three) times daily as needed for muscle spasms.   insulin lispro 100 UNIT/ML KwikPen Commonly known as: HUMALOG Inject 0-10 Units into the skin See admin instructions. Inject 2 units three times a day with meals. Inject additional units per sliding scale.   isosorbide mononitrate 30 MG 24 hr tablet Commonly known as: IMDUR Take 1 tablet (30 mg total) by mouth daily.   Jardiance 10 MG Tabs tablet Generic drug:  empagliflozin Take 1 tablet (10 mg total) by mouth daily before breakfast.  metoprolol tartrate 25 MG tablet Commonly known as: LOPRESSOR Take 0.5 tablets (12.5 mg total) by mouth 2 (two) times daily.   nicotine 21 mg/24hr patch Commonly known as: NICODERM CQ - dosed in mg/24 hours Place 1 patch (21 mg total) onto the skin daily. Start taking on: June 30, 2021   nitroGLYCERIN 0.4 MG SL tablet Commonly known as: NITROSTAT Place 1 tablet (0.4 mg total) under the tongue every 5 (five) minutes x 3 doses as needed for chest pain.   ondansetron 4 MG disintegrating tablet Commonly known as: ZOFRAN-ODT Take 1 tablet (4 mg total) by mouth every 8 (eight) hours as needed for nausea or vomiting.   pramipexole 0.25 MG tablet Commonly known as: MIRAPEX Take 0.25 mg by mouth at bedtime.   topiramate 50 MG tablet Commonly known as: TOPAMAX Take 50 mg by mouth 2 (two) times daily.   Tyler Aas FlexTouch 100 UNIT/ML FlexTouch Pen Generic drug: insulin degludec Inject 40 Units into the skin at bedtime.          Outstanding Labs/Studies   FLP/LFTs in 8 weeks  Duration of Discharge Encounter   Greater than 30 minutes including physician time.  Signed, Reino Bellis, NP 06/29/2021, 1:19 PM   ATTENDING ATTESTATION  I have seen, examined and evaluated the patient this AM on rounds along with Reino Bellis, NP-C.  After reviewing all the available data and chart, we discussed the patients laboratory, study & physical findings as well as symptoms in detail. I agree with her findings, examination as well as impression, summary &recommendations as per our discussion.    Attending adjustments noted in italics.   Non-STEMI with single-vessel disease involving a branch of an OM treated with PTCA only.  Unfortunately with some distal embolization down the other side branch, but no residual pain.  There was no significant recoil post angioplasty therefore treated with angioplasty alone.  I  am adding Imdur just to avoid potential recoil.  Would likely benefit from ARB in the outpatient setting.  Okay for discharge.    Glenetta Hew, M.D., M.S. Interventional Cardiologist   Pager # [phone] Phone # [phone] [address] [place], [place] [address]

## 2021-06-29 NOTE — Progress Notes (Signed)
CARDIAC REHAB PHASE I   PRE:  Rate/Rhythm: 91 SR  BP:  Supine:   Sitting: 140/92  Standing:    SaO2: 97%RA  MODE:  Ambulation: 470 ft   POST:  Rate/Rhythm: 107 ST  BP:  Supine:   Sitting: 130/78  Standing:    SaO2: 97-98%RA [id number] Pt walked 470 ft on RA with slow steady gait and tolerated well. Having some chest soreness that comes and goes. 2 -3 on pain scale that did not worsen with walk. Still having right arm pain and tingling that she had before admission and was to have tests by PCP. She discussed with cardiology NP. MI education completed with pt who voiced understanding. Stressed importance of brilinta, reviewed NTG use, MI restrictions, walking for ex, smoking cessation and gave diabetic and heart healthy diets. Gave smoking cessation handout. Pt quit by nicotine patches and did not smoke for 3 years. Plans to use patches again. Has called [facility]now before and knows she can call again. Highly recommend CRP 2 [place] as pt needs the supervised ex and dietary assistance.    Graylon Good, RN BSN  06/29/2021 9:35 AM

## 2021-06-29 NOTE — Discharge Instructions (Signed)
Information about your medication: Statin (cholesterol-lowering agent)  Generic Name (Brand): atorvastatin (Lipitor)  PURPOSE: You are taking this medication to lower your "bad" cholesterol (LDL) and to prevent heart attacks and strokes. Statins can also raise your "good" cholesterol (HDL).  Common SIDE EFFECTS you may experience include: muscle pain or weakness (especially in the legs) and upset stomach.  Take your medication exactly as prescribed. Do not eat large amounts of grapefruit or grapefruit juice while taking this medication.  Contact your health care provider if you experience: severe muscle pain that does not improve, dark urine, or yellowing of your skin or eyes. ----------------------------------------------------------------------------------------------------------------------  Information about your medication: Brilinta (anti-platelet agent)  Generic Name (Brand): ticagrelor (Brilinta), twice daily medication  PURPOSE: You are taking this medication along with aspirin to lower your chance of having a heart attack, stroke, or blood clots in your heart stent. These can be fatal. Brilinta and aspirin help prevent platelets from sticking together and forming a clot that can block an artery or your stent.   Common SIDE EFFECTS you may experience include: bruising or bleeding more easily, shortness of breath  Do not stop taking BRILINTA without talking to the doctor who prescribes it for you. People who are treated with a stent and stop taking Brilinta too soon, have a higher risk of getting a blood clot in the stent, having a heart attack, or dying. If you stop Brilinta because of bleeding, or for other reasons, your risk of a heart attack or stroke may increase.   Tell all of your doctors and dentists that you are taking Brilinta. They should talk to the doctor who prescribed Brilinta for you before you have any surgery or invasive procedure.   Contact your health care provider  if you experience: severe or uncontrollable bleeding, pink/red/brown urine, vomiting blood or vomit that looks like "coffee grounds", red or black stools (looks like tar), coughing up blood or blood clots ----------------------------------------------------------------------------------------------------------------------

## 2021-07-14 ENCOUNTER — Other Ambulatory Visit: Payer: Self-pay

## 2021-07-14 ENCOUNTER — Ambulatory Visit (INDEPENDENT_AMBULATORY_CARE_PROVIDER_SITE_OTHER): Payer: Medicare Other | Admitting: Cardiology

## 2021-07-14 ENCOUNTER — Encounter: Payer: Self-pay | Admitting: Cardiology

## 2021-07-14 VITALS — BP 126/70 | HR 88 | Ht 61.5 in | Wt 181.6 lb

## 2021-07-14 DIAGNOSIS — Z9861 Coronary angioplasty status: Secondary | ICD-10-CM

## 2021-07-14 DIAGNOSIS — I214 Non-ST elevation (NSTEMI) myocardial infarction: Secondary | ICD-10-CM

## 2021-07-14 DIAGNOSIS — Z720 Tobacco use: Secondary | ICD-10-CM

## 2021-07-14 DIAGNOSIS — E118 Type 2 diabetes mellitus with unspecified complications: Secondary | ICD-10-CM

## 2021-07-14 DIAGNOSIS — E1169 Type 2 diabetes mellitus with other specified complication: Secondary | ICD-10-CM | POA: Diagnosis not present

## 2021-07-14 DIAGNOSIS — I2510 Atherosclerotic heart disease of native coronary artery without angina pectoris: Secondary | ICD-10-CM | POA: Diagnosis not present

## 2021-07-14 DIAGNOSIS — G2581 Restless legs syndrome: Secondary | ICD-10-CM

## 2021-07-14 DIAGNOSIS — E785 Hyperlipidemia, unspecified: Secondary | ICD-10-CM

## 2021-07-14 DIAGNOSIS — I10 Essential (primary) hypertension: Secondary | ICD-10-CM

## 2021-07-14 DIAGNOSIS — Z794 Long term (current) use of insulin: Secondary | ICD-10-CM

## 2021-07-14 NOTE — Patient Instructions (Signed)
Medication Instructions:   Decrease the stress in your life.   *If you need a refill on your cardiac medications before your next appointment, please call your pharmacy*   Lab Work: Your physician recommends that you return for lab work in: 8 weeks just before your next visit.   If you have labs (blood work) drawn today and your tests are completely normal, you will receive your results only by: [place] (if you have MyChart) OR A paper copy in the mail If you have any lab test that is abnormal or we need to change your treatment, we will call you to review the results.   Testing/Procedures: NONE    Follow-Up: At [facility], you and your health needs are our priority.  As part of our continuing mission to provide you with exceptional heart care, we have created designated Provider Care Teams.  These Care Teams include your primary Cardiologist (physician) and Advanced Practice Providers (APPs -  Physician Assistants and Nurse Practitioners) who all work together to provide you with the care you need, when you need it.  We recommend signing up for the patient portal called "MyChart".  Sign up information is provided on this After Visit Summary.  MyChart is used to connect with patients for Virtual Visits (Telemedicine).  Patients are able to view lab/test results, encounter notes, upcoming appointments, etc.  Non-urgent messages can be sent to your provider as well.   To learn more about what you can do with MyChart, go to [email or web address].    Your next appointment:   8 week(s)  The format for your next appointment:   In Person  Provider:   You may see Carlyle Dolly, MD or one of the following Advanced Practice Providers on your designated Care Team:   Bernerd Pho, PA-C  Ermalinda Barrios, [place]    Other Instructions Thank you for choosing [facility]!

## 2021-07-14 NOTE — Progress Notes (Signed)
Cardiology Office Note   Date:  07/15/2021   ID:  Katelyn Kelly, DOB 09/03/1961, MRN [id number]  PCP:  Katelyn Major, MD  Cardiologist:  Dr. Harl Kelly    Chief Complaint  Patient presents with   Hospitalization Follow-up    NSTEMI      History of Present Illness: Katelyn Kelly is a 60 y.o. female who presents for post hospital.   She has hx of  DM2, MDD, + tobacco use, 1-2 PPD and GERD who presents with b/l arm pain that started 2 weeks go and worsened in severity now admitted for NSTEMI 06/26/21 to 06/29/21  troponin 4670 placed on IV heparin and underwent cardiac cath revealing single vessel CAD with severe thrombotic stenosis at bifurcation of intermediate branch. Treated with PCI angioplasty. Did have intermediate subbranch occlusion after angioplasty from distal embolization.   ASA/Brilinta for 1 year.  Echo with EF 60-65%, no RWMA, Imdur was added due to some chest discomfort and some possible spasm in PTCA site.   Potential titration of beta-blocker plus minus adding ARB on discharge.  With her diabetes, ARB would likely be a good option.  Can be initiated in the outpatient setting.  Today she has been doing well with occ brief -second of chest discomfort. Nothing like her admit pain.  She is emotional today has increased stress with her son living with her.  No SOB.  She has changed her diet, eating healthy now.  Stopped fried foods.  She is not wearing her nicotine patch because she is smoking but now half a pk per day down from 1-2 ppd.  She is trying to stop but home stress is causing difficulty.   Her restless legs are bothering her.  She cannot take whole lipitor so breaking in half which works for her.  She is interested in cardiac rehab so will make sure referral is there- .  Her PCP is following her DM and pt is now aware of importance and has changed diet.  She has Rt arm pain and PCP is managing.    Past Medical History:  Diagnosis Date   Anxiety and depression     Bilateral carpal tunnel syndrome    Chronic back pain    Chronic neck pain    Depression    Diabetes mellitus    GERD (gastroesophageal reflux disease)    H/O syncope    Headache    History of panic attacks    Hypertension    IBS (irritable bowel syndrome)    Manic depression ([place])    Migraine    Panic attack     Past Surgical History:  Procedure Laterality Date   ABDOMINAL HYSTERECTOMY     ABDOMINAL SURGERY     APPENDECTOMY     BIOPSY  09/05/2017   Procedure: BIOPSY;  Surgeon: Katelyn Binder, MD;  [facility];  Service: Endoscopy;;  random colon   BIOPSY  05/01/2018   Procedure: BIOPSY;  Surgeon: Katelyn Binder, MD;  [facility];  Service: Endoscopy;;  gastric biopsy   CARPAL TUNNEL RELEASE Bilateral    CHOLECYSTECTOMY     COLONOSCOPY WITH PROPOFOL N/A 09/05/2017   Procedure: COLONOSCOPY WITH PROPOFOL;  Surgeon: Katelyn Binder, MD; normal TI, 5 small polyps, diverticulosis in the cecum, internal and external hemorrhoids.  Random colon biopsies benign.  Colon polyps were hyperplastic.  Repeat in 2023.   CORONARY BALLOON ANGIOPLASTY N/A 06/28/2021   Procedure: CORONARY BALLOON ANGIOPLASTY;  Surgeon:  Katelyn Mocha, MD;  Location: [facility];  Service: Cardiovascular;  Laterality: N/A;   DILATION AND CURETTAGE OF UTERUS     ESOPHAGOGASTRODUODENOSCOPY (EGD) WITH PROPOFOL N/A 05/01/2018   Procedure: ESOPHAGOGASTRODUODENOSCOPY (EGD) WITH PROPOFOL;  Surgeon: Katelyn Binder, MD;  normal esophagus, small hiatal hernia, mild gastritis s/p biopsy, normal duodenum.  No H. pylori.    HERNIA REPAIR     INCISIONAL HERNIA REPAIR N/A 11/06/2020   Procedure: HERNIA REPAIR INCISIONAL, PRIMARY REPAIR;  Surgeon: Katelyn Cagey, MD;  Location: [facility];  Service: General;  Laterality: N/A;   LEFT HEART CATH AND CORONARY ANGIOGRAPHY N/A 06/28/2021   Procedure: LEFT HEART CATH AND CORONARY ANGIOGRAPHY;  Surgeon: Katelyn Mocha, MD;  Location: [facility];   Service: Cardiovascular;  Laterality: N/A;   POLYPECTOMY  09/05/2017   Procedure: POLYPECTOMY;  Surgeon: Katelyn Binder, MD;  [facility];  Service: Endoscopy;;  sigmoid and rectal   TUBAL LIGATION       Current Outpatient Medications  Medication Sig Dispense Refill   ALPRAZolam (XANAX) 0.5 MG tablet Take 1 tablet (0.5 mg total) by mouth in the morning, at noon, in the evening, and at bedtime.     aspirin 81 MG EC tablet Take 1 tablet (81 mg total) by mouth daily. Swallow whole. 90 tablet 2   atorvastatin (LIPITOR) 80 MG tablet Take 1 tablet (80 mg total) by mouth at bedtime. 90 tablet 1   busPIRone (BUSPAR) 7.5 MG tablet Take by mouth See admin instructions. Take 0.5 tablets (3.75 mg total) by mouth 3 times daily for 7 days, THEN 1 tablet (7.5 mg total) 3 times daily for 7 days, THEN 2 tablets (15 mg total) 3 times daily     cyclobenzaprine (FLEXERIL) 5 MG tablet Take 5-10 mg by mouth 3 (three) times daily as needed for muscle spasms.     empagliflozin (JARDIANCE) 10 MG TABS tablet Take 1 tablet (10 mg total) by mouth daily before breakfast. 30 tablet 11   insulin lispro (HUMALOG) 100 UNIT/ML KwikPen Inject 0-10 Units into the skin See admin instructions. Inject 2 units three times a day with meals. Inject additional units per sliding scale.     isosorbide mononitrate (IMDUR) 30 MG 24 hr tablet Take 1 tablet (30 mg total) by mouth daily. 90 tablet 0   Menthol, Topical Analgesic, (BIOFREEZE) 10 % LIQD Apply 1 application topically daily as needed (pain).     metoprolol tartrate (LOPRESSOR) 25 MG tablet Take 0.5 tablets (12.5 mg total) by mouth 2 (two) times daily. 90 tablet 0   nicotine (NICODERM CQ - DOSED IN MG/24 HOURS) 21 mg/24hr patch Place 1 patch (21 mg total) onto the skin daily. 28 patch 0   nitroGLYCERIN (NITROSTAT) 0.4 MG SL tablet Place 1 tablet (0.4 mg total) under the tongue every 5 (five) minutes x 3 doses as needed for chest pain. 25 tablet 2   ondansetron  (ZOFRAN-ODT) 4 MG disintegrating tablet Take 1 tablet (4 mg total) by mouth every 8 (eight) hours as needed for nausea or vomiting. 30 tablet 2   pramipexole (MIRAPEX) 0.25 MG tablet Take 0.25 mg by mouth at bedtime.     ticagrelor (BRILINTA) 90 MG TABS tablet Take 1 tablet (90 mg total) by mouth 2 (two) times daily. 180 tablet 2   topiramate (TOPAMAX) 50 MG tablet Take 50 mg by mouth 2 (two) times daily.     TRESIBA FLEXTOUCH 100 UNIT/ML SOPN FlexTouch Pen Inject 40 Units  into the skin at bedtime.     No current facility-administered medications for this visit.    Allergies:   Meprobamate, Sinequan [doxepin hcl], Tranxene [clorazepate dipotassium], Albuterol, Haloperidol, Sertraline, and Vortioxetine    Social History:  The patient  reports that she has been smoking cigarettes. She has a 40.00 pack-year smoking history. She has never used smokeless tobacco. She reports that she does not drink alcohol and does not use drugs.   Family History:  The patient's family history includes Bipolar disorder in her son; Colon cancer (age of onset: 1) in her brother; Colon cancer (age of onset: 102) in her father; Depression in her mother; Diabetes in her mother; Stroke in her mother.    ROS:  General:no colds or fevers, no weight changes Skin:no rashes or ulcers HEENT:no blurred vision, no congestion CV:see HPI PUL:see HPI GI:no diarrhea constipation or melena, no indigestion GU:no hematuria, no dysuria MS:no joint pain, no claudication, + restless legs Neuro:no syncope, no lightheadedness Endo:+ diabetes, no thyroid disease  Wt Readings from Last 3 Encounters:  07/14/21 181 lb 9.6 oz (82.4 kg)  06/28/21 179 lb 9.6 oz (81.5 kg)  12/17/20 181 lb (82.1 kg)     PHYSICAL EXAM: VS:  BP 126/70   Pulse 88   Ht 5' 1.5" (1.562 m)   Wt 181 lb 9.6 oz (82.4 kg)   SpO2 98%   BMI 33.76 kg/m  , BMI Body mass index is 33.76 kg/m. General:Pleasant affect, NAD Skin:Warm and dry, brisk capillary  refill HEENT:normocephalic, sclera clear, mucus membranes moist Neck:supple, no JVD, no bruits  Heart:S1S2 RRR without murmur, gallup, rub or click, rt wrist cath site without hematoma . Lungs:clear without rales, rhonchi, + wheezes in bases only [id number], non tender, + BS, do not palpate liver spleen or masses Ext:no lower ext edema, 2+ pedal pulses, 2+ radial pulses Neuro:alert and oriented X 3, MAE, follows commands, + facial symmetry    EKG:  EKG is NOT ordered today.    Recent Labs: 10/08/2020: ALT 17 06/26/2021: B Natriuretic Peptide 16.0; TSH 1.518 06/29/2021: BUN 13; Creatinine, Ser 1.13; Hemoglobin 13.2; Platelets 214; Potassium 3.7; Sodium 139    Lipid Panel    Component Value Date/Time   CHOL 193 06/26/2021 1827   TRIG 158 (H) 06/26/2021 1827   HDL 56 06/26/2021 1827   CHOLHDL 3.4 06/26/2021 1827   VLDL 32 06/26/2021 1827   LDLCALC 105 (H) 06/26/2021 1827       Other studies Reviewed: Additional studies/ records that were reviewed today include: . Cath: 06/28/21   Ramus lesion is 95% stenosed.   Balloon angioplasty was performed using a BALLOON SAPPHIRE 2.0X12.   Post intervention, there is a 10% residual stenosis.   1.  Single-vessel coronary artery disease with severe thrombotic stenosis at the bifurcation of the intermediate branch, treated successfully with balloon angioplasty using a 2 mm balloon.  Intermediate subbranch occlusion noted following angioplasty, likely from distal embolization 2.  Patent left main, LAD, AV circumflex, and RCA without significant stenosis 3.  Normal LV function evaluated by echo with normal LVEDP   Recommend: Aggressive medical therapy, tobacco cessation, dual antiplatelet therapy with aspirin and ticagrelor as tolerated for 12 months for treatment of ACS/non-STEMI   Diagnostic Dominance: Right Intervention     Echo: 06/27/21   IMPRESSIONS     1. Left ventricular ejection fraction, by estimation, is 60 to 65%. The   left ventricle has normal function. The left ventricle has no regional  wall  motion abnormalities. Left ventricular diastolic parameters are  consistent with Grade I diastolic  dysfunction (impaired relaxation).   2. Right ventricular systolic function is normal. The right ventricular  size is normal. Tricuspid regurgitation signal is inadequate for assessing  PA pressure.   3. The mitral valve is normal in structure. No evidence of mitral valve  regurgitation. No evidence of mitral stenosis.   4. The aortic valve is tricuspid. Aortic valve regurgitation is not  visualized. Mild aortic valve sclerosis is present, with no evidence of  aortic valve stenosis.   5. The inferior vena cava is normal in size with greater than 50%  respiratory variability, suggesting right atrial pressure of 3 mmHg.   FINDINGS   Left Ventricle: Left ventricular ejection fraction, by estimation, is 60  to 65%. The left ventricle has normal function. The left ventricle has no  regional wall motion abnormalities. The left ventricular internal cavity  size was normal in size. There is   no left ventricular hypertrophy. Left ventricular diastolic parameters  are consistent with Grade I diastolic dysfunction (impaired relaxation).  Normal left ventricular filling pressure.   Right Ventricle: The right ventricular size is normal. No increase in  right ventricular wall thickness. Right ventricular systolic function is  normal. Tricuspid regurgitation signal is inadequate for assessing PA  pressure.   Left Atrium: Left atrial size was normal in size.   Right Atrium: Right atrial size was normal in size.   Pericardium: There is no evidence of pericardial effusion.   Mitral Valve: The mitral valve is normal in structure. No evidence of  mitral valve regurgitation. No evidence of mitral valve stenosis.   Tricuspid Valve: The tricuspid valve is normal in structure. Tricuspid  valve regurgitation is not  demonstrated. No evidence of tricuspid  stenosis.   Aortic Valve: The aortic valve is tricuspid. Aortic valve regurgitation is  not visualized. Mild aortic valve sclerosis is present, with no evidence  of aortic valve stenosis.   Pulmonic Valve: The pulmonic valve was normal in structure. Pulmonic valve  regurgitation is not visualized. No evidence of pulmonic stenosis.   Aorta: The aortic root is normal in size and structure.   Venous: The inferior vena cava is normal in size with greater than 50%  respiratory variability, suggesting right atrial pressure of 3 mmHg.   IAS/Shunts: No atrial level shunt detected by color flow Doppler.  _____________    ASSESSMENT AND PLAN:  1.  Recent NSTEMI with single vessel CAD of Ram[place] artery severe thrombotic stenosis at the bifurcation of the intermediate branch, treated successfully with balloon angioplasty.  Now on ASA and Brilinta with recommendations to continue for 12 months.  She is on BB, at 12.5 BID imdur for possible spasm and statin.  Today with compliant of fatigue will not increase BB but would consider on next visit vs ARB.  Will send message to cardiac rehab phase 2 she would like to attend.  I reviewed hospital records, tests and labs.   2. HTN controlled see above.  3.  DM-2 poorly controlled on admit but she has changed diet and taking meds.  She is on GLPII  jardiance continue - PCP monitoring.  4.  HLD on lipitor 80 ok to break in half to take.  Need hepatic and lipid panel in 6-8 weeks to eval.  5. Restless legs per PCP but may need increase of pramipexole, this is fine pt made aware to monitor for hypotension, drowsiness.    6.  Tobacco  use, has decreased, still home stress, have asked her to avoid stress when possible.  She will resume nicotine patch when she can stop tobacco.     Current medicines are reviewed with the patient today.  The patient Has no concerns regarding medicines.  The following changes have been  made:  See above Labs/ tests ordered today include:see above  Disposition:   FU:  see above  Signed, Cecilie Kicks, NP  07/15/2021 1:00 PM    [facility], [place], [place], [place] Phone: [phone]; Fax: [phone]

## 2021-07-15 ENCOUNTER — Telehealth: Payer: Self-pay | Admitting: Cardiology

## 2021-07-15 ENCOUNTER — Encounter: Payer: Self-pay | Admitting: Cardiology

## 2021-07-15 ENCOUNTER — Other Ambulatory Visit (HOSPITAL_COMMUNITY): Payer: Self-pay

## 2021-07-15 ENCOUNTER — Telehealth (HOSPITAL_BASED_OUTPATIENT_CLINIC_OR_DEPARTMENT_OTHER): Payer: Self-pay | Admitting: Pharmacist

## 2021-07-15 ENCOUNTER — Other Ambulatory Visit (HOSPITAL_BASED_OUTPATIENT_CLINIC_OR_DEPARTMENT_OTHER): Payer: Self-pay

## 2021-07-15 NOTE — Telephone Encounter (Signed)
Please let pt know and I will place in chart for her PCP that if her restless leg medication is increased then monitor for lower BP, drowsiness and maybe increase of lower ext edema.    Also it is fine to break the lipitor in half to take.  Checked with our pharmacy for both.

## 2021-07-15 NOTE — Telephone Encounter (Signed)
Pharmacy Transitions of Care Follow-up Telephone Call  Date of discharge: 06/29/2021  Discharge Diagnosis: NSTEMI  How have you been since you were released from the hospital? Doing well overall. Struggling to get energy back, we discussed this is a normal process after her hospitalization.  She will start therapy soon. Discussed that she was taking a full tablet of metoprolol instead of 1/2 tablet. This was corrected after doing a medication management call with her home pharmacist. She is feeling better after this correction.  Medication changes made at discharge:  - START: Brilinta, ASA 81 mg   - STOPPED: ketorolac, sumatriptan   Medication changes verified by the patient? YES    Medication Accessibility:  Home Pharmacy: [facility]    Was the patient provided with refills on discharged medications? Yes   Have all prescriptions been transferred from [facility] to home pharmacy? Completed now.    Is the patient able to afford medications? Yes - insured; Brilinta $0 copay     Medication Review:  TICAGRELOR (BRILINTA) Ticagrelor 90 mg BID - Educated patient on expected potential duration of therapy with ASA. - Discussed importance of taking medication around the same time every day, - Reviewed potential DDIs with patient - Advised patient of medications to avoid (NSAIDs, aspirin maintenance doses>100 mg daily) - Educated that Tylenol (acetaminophen) will be the preferred analgesic to prevent risk of bleeding  - Emphasized importance of monitoring for signs and symptoms of bleeding (abnormal bruising, prolonged bleeding, nose bleeds, bleeding from gums, discolored urine, black tarry stools)  - Educated patient to notify doctor if shortness of breath or abnormal heartbeat occur - Advised patient to alert all providers of antiplatelet therapy prior to starting a new medication or having a procedure    Follow-up Appointments:  Family Medicine appointment on 08/03/2021  If their  condition worsens, is the pt aware to call PCP or go to the Emergency Dept.? Yes  Harriet Pho, PharmD Clinical Pharmacist [facility]  07/15/2021 1:04 PM

## 2021-07-16 NOTE — Telephone Encounter (Signed)
No answer, no machine-cc

## 2021-07-21 NOTE — Telephone Encounter (Signed)
Spoke with pt and she states that she spoke with her PCP and they decided to cut back on her Buspar first.

## 2021-07-21 NOTE — Telephone Encounter (Signed)
Called pt. No answer, left msg to return call.

## 2021-08-03 ENCOUNTER — Other Ambulatory Visit: Payer: Self-pay

## 2021-08-03 ENCOUNTER — Other Ambulatory Visit (HOSPITAL_COMMUNITY): Payer: Self-pay | Admitting: Family Medicine

## 2021-08-03 ENCOUNTER — Ambulatory Visit (HOSPITAL_COMMUNITY)
Admission: RE | Admit: 2021-08-03 | Discharge: 2021-08-03 | Disposition: A | Discharge status: Home / Self Care | Payer: Medicare Other | Source: Ambulatory Visit | Attending: Family Medicine | Admitting: Family Medicine

## 2021-08-03 DIAGNOSIS — R0609 Other forms of dyspnea: Secondary | ICD-10-CM | POA: Diagnosis present

## 2021-08-17 ENCOUNTER — Ambulatory Visit
Admission: EM | Admit: 2021-08-17 | Discharge: 2021-08-17 | Disposition: A | Discharge status: Home / Self Care | Payer: Medicare Other | Attending: Family Medicine | Admitting: Family Medicine

## 2021-08-17 ENCOUNTER — Other Ambulatory Visit: Payer: Self-pay

## 2021-08-17 DIAGNOSIS — R300 Dysuria: Secondary | ICD-10-CM | POA: Insufficient documentation

## 2021-08-17 LAB — POCT URINALYSIS DIP (MANUAL ENTRY)
Bilirubin, UA: NEGATIVE
Glucose, UA: NEGATIVE mg/dL
Ketones, POC UA: NEGATIVE mg/dL
Leukocytes, UA: NEGATIVE
Nitrite, UA: NEGATIVE
Protein Ur, POC: NEGATIVE mg/dL
Spec Grav, UA: 1.015 (ref 1.010–1.025)
Urobilinogen, UA: 0.2 E.U./dL
pH, UA: 7 (ref 5.0–8.0)

## 2021-08-17 MED ORDER — CEPHALEXIN 500 MG PO CAPS: 500.0000 mg | ORAL_CAPSULE | Freq: Two times a day (BID) | ORAL | 0 refills | Status: DC

## 2021-08-17 MED ORDER — FLUCONAZOLE 150 MG PO TABS: ORAL_TABLET | ORAL | 0 refills | Status: DC

## 2021-08-17 NOTE — Discharge Instructions (Addendum)
You have had labs (urine culture) sent today. We will call you with any significant abnormalities or if there is need to begin or change treatment or pursue further follow up.  You may also review your test results online through MyChart. If you do not have a MyChart account, instructions to sign up should be on your discharge paperwork.  

## 2021-08-17 NOTE — ED Triage Notes (Signed)
Pt presents with dysuria and "a Karelly Dewalt spot" at her vagina

## 2021-08-18 LAB — URINE CULTURE: Culture: <10000 — AB

## 2021-08-18 NOTE — ED Provider Notes (Signed)
[facility]    ASSESSMENT & PLAN:  1. Dysuria    Begin: Meds ordered this encounter  Medications   cephALEXin (KEFLEX) 500 MG capsule    Sig: Take 1 capsule (500 mg total) by mouth 2 (two) times daily.    Dispense:  10 capsule    Refill:  0   fluconazole (DIFLUCAN) 150 MG tablet    Sig: Take one tablet by mouth as a single dose. May repeat in 3 days if symptoms persist.    Dispense:  2 tablet    Refill:  0   Labs Reviewed  POCT URINALYSIS DIP (MANUAL ENTRY) - Abnormal; Notable for the following components:      Result Value   Blood, UA trace-intact (*)    All other components within normal limits  URINE CULTURE  -pending culture  No signs of pyelonephritis. Urine culture sent. Will follow up with her PCP or here if not showing improvement over the next 48 hours, sooner if needed.  Outlined signs and symptoms indicating need for more acute intervention. Patient verbalized understanding. After Visit Summary given.  SUBJECTIVE:  Katelyn Kelly is a 60 y.o. female who complains of urinary frequency, urgency and dysuria for the past few days. Without associated flank pain, fever, chills, vaginal discharge or bleeding. With vaginal irritation; not sexually active. Gross hematuria: not present. No specific aggravating or alleviating factors reported. No LE edema. Normal PO intake without n/v/d. Without specific abdominal pain. Ambulatory without difficulty. OTC treatment: none.  LMP: No LMP recorded. Patient has had a hysterectomy.   OBJECTIVE:  Vitals:   08/17/21 1716  BP: 104/69  Pulse: 94  Resp: 16  Temp: 98 F (36.7 C)  TempSrc: Oral  SpO2: 94%   General appearance: alert; no distress Lungs: unlabored respirations Abdomen: soft, non-tender; bowel sounds normal; no masses or organomegaly; no guarding or rebound tenderness Back: no CVA tenderness GU: external vaginal erythema/irritation Extremities: no edema; symmetrical with no gross  deformities Skin: warm and dry Neurologic: normal gait Psychological: alert and cooperative; normal mood and affect  Labs Reviewed  POCT URINALYSIS DIP (MANUAL ENTRY) - Abnormal; Notable for the following components:      Result Value   Blood, UA trace-intact (*)    All other components within normal limits  URINE CULTURE    Allergies  Allergen Reactions   Meprobamate Hives and Swelling   Sinequan [Doxepin Hcl] Anaphylaxis   Tranxene [Clorazepate Dipotassium] Hives and Swelling   Albuterol Other (See Comments)    Patient does not remember the reaction. This was a record provided via [facility]   Haloperidol Other (See Comments)    Patient does not remember the reaction. This was a record provided via [facility]   Sertraline Other (See Comments)    Patient does not remember the reaction. This was a record provided via [facility] Other (See Comments)    Patient does not remember the reaction. This was a record provided via [facility]    Past Medical History:  Diagnosis Date   Anxiety and depression    Bilateral carpal tunnel syndrome    Chronic back pain    Chronic neck pain    Depression    Diabetes mellitus    GERD (gastroesophageal reflux disease)    H/O syncope    Headache    History of panic attacks    Hypertension    IBS (irritable bowel syndrome)    Manic depression ([place])  Migraine    Panic attack    Social History   Socioeconomic History   Marital status: Legally Separated    Spouse name: Not on file   Number of children: Not on file   Years of education: Not on file   Highest education level: Not on file  Occupational History   Not on file  Tobacco Use   Smoking status: Every Day    Packs/day: 1.00    Years: 40.00    Pack years: 40.00    Types: Cigarettes   Smokeless tobacco: Never   Tobacco comments:    one pack a day  Vaping Use   Vaping Use: Never used  Substance and Sexual Activity   Alcohol use:  No   Drug use: No   Sexual activity: Not Currently    Birth control/protection: Surgical  Other Topics Concern   Not on file  Social History Narrative   Not on file   Social Determinants of Health   Financial Resource Strain: Not on file  Food Insecurity: Not on file  Transportation Needs: Not on file  Physical Activity: Not on file  Stress: Not on file  Social Connections: Not on file  Intimate Partner Violence: Not on file   Family History  Problem Relation Age of Onset   Diabetes Mother    Stroke Mother    Depression Mother    Colon cancer Father 14       Passed away from colon cancer   Colon cancer Brother 32       Passed away from colon cancer   Bipolar disorder Son         Vanessa Kick, MD 08/18/21 [phone]

## 2021-08-23 ENCOUNTER — Other Ambulatory Visit (HOSPITAL_COMMUNITY): Payer: Self-pay | Admitting: Family Medicine

## 2021-08-23 ENCOUNTER — Other Ambulatory Visit: Payer: Self-pay | Admitting: Family Medicine

## 2021-08-23 DIAGNOSIS — R918 Other nonspecific abnormal finding of lung field: Secondary | ICD-10-CM

## 2021-08-23 DIAGNOSIS — R0789 Other chest pain: Secondary | ICD-10-CM

## 2021-08-23 DIAGNOSIS — R062 Wheezing: Secondary | ICD-10-CM

## 2021-08-23 DIAGNOSIS — R0609 Other forms of dyspnea: Secondary | ICD-10-CM

## 2021-09-06 NOTE — Progress Notes (Signed)
Cardiology Office Note    Date:  09/13/2021   ID:  ZILLAH ALEXIE, DOB 01-05-1961, MRN [id number]   PCP:  Nickola Major, MD   [place]  Cardiologist:  Carlyle Dolly, MD   Advanced Practice Provider:  No care team member to display Electrophysiologist:  None   [phone]   Chief Complaint  Patient presents with   Follow-up     History of Present Illness:  Katelyn Kelly is a 60 y.o. female with history of DM2, tobacco abuse, CAD NSTEMI 06/26/2021 treated with PCI and angioplasty to a severe thrombotic stenosis of the bifurcation of intermediate branch.  Did have intermediate subbranch occlusion after angioplasty from distal embolization.  Plan for aspirin and Brilinta for 1 year.  Echo EF 60 to 65% no wall motion abnormality.  Imdur added for possible coronary spasm.  Patient was last seen in our office 07/14/2021 and had increased stress from her son living with her.  Was taking half a Lipitor.  Patient comes in for f/u. Has a constant pressure in her chest worse with a panic attack. PCP has decreased her alprazolam to twice daily and she's having more panic attacks. Trying to increase her Buspar as well. Feels very different than when she had her MI. She does get some relief occasional with NTG or rubbing chest.  Smoking 1 ppd down from 3 ppd. Going to start cardiac rehab. Walking 5-10 min daily-short of breath from smoking.     Past Medical History:  Diagnosis Date   Anxiety and depression    Bilateral carpal tunnel syndrome    Chronic back pain    Chronic neck pain    Depression    Diabetes mellitus    GERD (gastroesophageal reflux disease)    H/O syncope    Headache    History of panic attacks    Hypertension    IBS (irritable bowel syndrome)    Manic depression ([place])    Migraine    Panic attack     Past Surgical History:  Procedure Laterality Date   ABDOMINAL HYSTERECTOMY     ABDOMINAL SURGERY     APPENDECTOMY      BIOPSY  09/05/2017   Procedure: BIOPSY;  Surgeon: [name], MD;  [facility];  Service: Endoscopy;;  random colon   BIOPSY  05/01/2018   Procedure: BIOPSY;  Surgeon: [name], MD;  [facility];  Service: Endoscopy;;  gastric biopsy   CARPAL TUNNEL RELEASE Bilateral    CHOLECYSTECTOMY     COLONOSCOPY WITH PROPOFOL N/A 09/05/2017   Procedure: COLONOSCOPY WITH PROPOFOL;  Surgeon: [name], MD; normal TI, 5 small polyps, diverticulosis in the cecum, internal and external hemorrhoids.  Random colon biopsies benign.  Colon polyps were hyperplastic.  Repeat in 2023.   CORONARY BALLOON ANGIOPLASTY N/A 06/28/2021   Procedure: CORONARY BALLOON ANGIOPLASTY;  Surgeon: Sherren Mocha, MD;  Location: [facility];  Service: Cardiovascular;  Laterality: N/A;   DILATION AND CURETTAGE OF UTERUS     ESOPHAGOGASTRODUODENOSCOPY (EGD) WITH PROPOFOL N/A 05/01/2018   Procedure: ESOPHAGOGASTRODUODENOSCOPY (EGD) WITH PROPOFOL;  Surgeon: [name], MD;  normal esophagus, small hiatal hernia, mild gastritis s/p biopsy, normal duodenum.  No H. pylori.    HERNIA REPAIR     INCISIONAL HERNIA REPAIR N/A 11/06/2020   Procedure: HERNIA REPAIR INCISIONAL, PRIMARY REPAIR;  Surgeon: Virl Cagey, MD;  Location: [facility];  Service: General;  Laterality: N/A;  LEFT HEART CATH AND CORONARY ANGIOGRAPHY N/A 06/28/2021   Procedure: LEFT HEART CATH AND CORONARY ANGIOGRAPHY;  Surgeon: Sherren Mocha, MD;  Location: [facility];  Service: Cardiovascular;  Laterality: N/A;   POLYPECTOMY  09/05/2017   Procedure: POLYPECTOMY;  Surgeon: [name], MD;  [facility];  Service: Endoscopy;;  sigmoid and rectal   TUBAL LIGATION      Current Medications: Current Meds  Medication Sig   ALPRAZolam (XANAX) 0.5 MG tablet Take 1 tablet (0.5 mg total) by mouth in the morning, at noon, in the evening, and at bedtime.   aspirin 81 MG EC tablet Take 1 tablet (81 mg total) by  mouth daily. Swallow whole.   atorvastatin (LIPITOR) 80 MG tablet Take 1 tablet (80 mg total) by mouth at bedtime.   busPIRone (BUSPAR) 7.5 MG tablet Take by mouth See admin instructions. Take 0.5 tablets (3.75 mg total) by mouth 3 times daily for 7 days, THEN 1 tablet (7.5 mg total) 3 times daily for 7 days, THEN 2 tablets (15 mg total) 3 times daily   cyclobenzaprine (FLEXERIL) 5 MG tablet Take 5-10 mg by mouth 3 (three) times daily as needed for muscle spasms.   empagliflozin (JARDIANCE) 10 MG TABS tablet Take 1 tablet (10 mg total) by mouth daily before breakfast.   insulin lispro (HUMALOG) 100 UNIT/ML KwikPen Inject 0-10 Units into the skin See admin instructions. Inject 2 units three times a day with meals. Inject additional units per sliding scale.   isosorbide mononitrate (IMDUR) 30 MG 24 hr tablet Take 1 tablet (30 mg total) by mouth 2 (two) times daily.   Menthol, Topical Analgesic, (BIOFREEZE) 10 % LIQD Apply 1 application topically daily as needed (pain).   metoprolol tartrate (LOPRESSOR) 25 MG tablet Take 0.5 tablets (12.5 mg total) by mouth 2 (two) times daily.   nicotine (NICODERM CQ - DOSED IN MG/24 HOURS) 21 mg/24hr patch Place 1 patch (21 mg total) onto the skin daily.   nitroGLYCERIN (NITROSTAT) 0.4 MG SL tablet Place 1 tablet (0.4 mg total) under the tongue every 5 (five) minutes x 3 doses as needed for chest pain.   ondansetron (ZOFRAN-ODT) 4 MG disintegrating tablet Take 1 tablet (4 mg total) by mouth every 8 (eight) hours as needed for nausea or vomiting.   pramipexole (MIRAPEX) 0.25 MG tablet Take 0.25 mg by mouth at bedtime.   ticagrelor (BRILINTA) 90 MG TABS tablet Take 1 tablet (90 mg total) by mouth 2 (two) times daily.   topiramate (TOPAMAX) 50 MG tablet Take 50 mg by mouth 2 (two) times daily.   TRESIBA FLEXTOUCH 100 UNIT/ML SOPN FlexTouch Pen Inject 40 Units into the skin at bedtime.   [DISCONTINUED] isosorbide mononitrate (IMDUR) 30 MG 24 hr tablet Take 1 tablet (30 mg  total) by mouth daily.     Allergies:   Meprobamate, Sinequan [doxepin hcl], Tranxene [clorazepate dipotassium], Albuterol, Haloperidol, Sertraline, and Vortioxetine   Social History   Socioeconomic History   Marital status: Legally Separated    Spouse name: Not on file   Number of children: Not on file   Years of education: Not on file   Highest education level: Not on file  Occupational History   Not on file  Tobacco Use   Smoking status: Every Day    Packs/day: 1.00    Years: 40.00    Pack years: 40.00    Types: Cigarettes   Smokeless tobacco: Never   Tobacco comments:    one  pack a day  Vaping Use   Vaping Use: Never used  Substance and Sexual Activity   Alcohol use: No   Drug use: No   Sexual activity: Not Currently    Birth control/protection: Surgical  Other Topics Concern   Not on file  Social History Narrative   Not on file   Social Determinants of Health   Financial Resource Strain: Not on file  Food Insecurity: Not on file  Transportation Needs: Not on file  Physical Activity: Not on file  Stress: Not on file  Social Connections: Not on file     Family History:  The patient's  family history includes Bipolar disorder in her son; Colon cancer (age of onset: 69) in her brother; Colon cancer (age of onset: 53) in her father; Depression in her mother; Diabetes in her mother; Stroke in her mother.   ROS:   Please see the history of present illness.    ROS All other systems reviewed and are negative.   PHYSICAL EXAM:   VS:  BP 110/76   Pulse 94   Ht 5' 1.5" (1.562 m)   Wt 179 lb 3.2 oz (81.3 kg)   SpO2 97%   BMI 33.31 kg/m   Physical Exam  GEN:  Obese, in no acute distress  Neck: no JVD, carotid bruits, or masses Cardiac:RRR; no murmurs, rubs, or gallops  Respiratory:  clear to auscultation bilaterally, normal work of breathing GI: soft, nontender, nondistended, + BS Ext: without cyanosis, clubbing, or edema, Good distal pulses  bilaterally Neuro:  Alert and Oriented x 3, Psych: Anxious  Wt Readings from Last 3 Encounters:  09/13/21 179 lb 3.2 oz (81.3 kg)  07/14/21 181 lb 9.6 oz (82.4 kg)  06/28/21 179 lb 9.6 oz (81.5 kg)      Studies/Labs Reviewed:   EKG:  EKG is not ordered today.     Recent Labs: 10/08/2020: ALT 17 06/26/2021: B Natriuretic Peptide 16.0; TSH 1.518 06/29/2021: BUN 13; Creatinine, Ser 1.13; Hemoglobin 13.2; Platelets 214; Potassium 3.7; Sodium 139   Lipid Panel    Component Value Date/Time   CHOL 193 06/26/2021 1827   TRIG 158 (H) 06/26/2021 1827   HDL 56 06/26/2021 1827   CHOLHDL 3.4 06/26/2021 1827   VLDL 32 06/26/2021 1827   LDLCALC 105 (H) 06/26/2021 1827    Additional studies/ records that were reviewed today include:  Cath: 06/28/21   [place] lesion is 95% stenosed.   Balloon angioplasty was performed using a BALLOON SAPPHIRE 2.0X12.   Post intervention, there is a 10% residual stenosis.   1.  Single-vessel coronary artery disease with severe thrombotic stenosis at the bifurcation of the intermediate branch, treated successfully with balloon angioplasty using a 2 mm balloon.  Intermediate subbranch occlusion noted following angioplasty, likely from distal embolization 2.  Patent left main, LAD, AV circumflex, and RCA without significant stenosis 3.  Normal LV function evaluated by echo with normal LVEDP   Recommend: Aggressive medical therapy, tobacco cessation, dual antiplatelet therapy with aspirin and ticagrelor as tolerated for 12 months for treatment of ACS/non-STEMI  Echo: 06/27/21   IMPRESSIONS     1. Left ventricular ejection fraction, by estimation, is 60 to 65%. The  left ventricle has normal function. The left ventricle has no regional  wall motion abnormalities. Left ventricular diastolic parameters are  consistent with Grade I diastolic  dysfunction (impaired relaxation).   2. Right ventricular systolic function is normal. The right ventricular  size is  normal. Tricuspid regurgitation  signal is inadequate for assessing  PA pressure.   3. The mitral valve is normal in structure. No evidence of mitral valve  regurgitation. No evidence of mitral stenosis.   4. The aortic valve is tricuspid. Aortic valve regurgitation is not  visualized. Mild aortic valve sclerosis is present, with no evidence of  aortic valve stenosis.   5. The inferior vena cava is normal in size with greater than 50%  respiratory variability, suggesting right atrial pressure of 3 mmHg.   FINDINGS   Left Ventricle: Left ventricular ejection fraction, by estimation, is 60  to 65%. The left ventricle has normal function. The left ventricle has no  regional wall motion abnormalities. The left ventricular internal cavity  size was normal in size. There is   no left ventricular hypertrophy. Left ventricular diastolic parameters  are consistent with Grade I diastolic dysfunction (impaired relaxation).  Normal left ventricular filling pressure.   Right Ventricle: The right ventricular size is normal. No increase in  right ventricular wall thickness. Right ventricular systolic function is  normal. Tricuspid regurgitation signal is inadequate for assessing PA  pressure.   Left Atrium: Left atrial size was normal in size.   Right Atrium: Right atrial size was normal in size.   Pericardium: There is no evidence of pericardial effusion.   Mitral Valve: The mitral valve is normal in structure. No evidence of  mitral valve regurgitation. No evidence of mitral valve stenosis.   Tricuspid Valve: The tricuspid valve is normal in structure. Tricuspid  valve regurgitation is not demonstrated. No evidence of tricuspid  stenosis.   Aortic Valve: The aortic valve is tricuspid. Aortic valve regurgitation is  not visualized. Mild aortic valve sclerosis is present, with no evidence  of aortic valve stenosis.   Pulmonic Valve: The pulmonic valve was normal in structure. Pulmonic valve   regurgitation is not visualized. No evidence of pulmonic stenosis.   Aorta: The aortic root is normal in size and structure.   Venous: The inferior vena cava is normal in size with greater than 50%  respiratory variability, suggesting right atrial pressure of 3 mmHg.   IAS/Shunts: No atrial level shunt detected by color flow Doppler.  _____________     Risk Assessment/Calculations:         ASSESSMENT:    1. CAD in native artery   2. Essential hypertension   3. Hyperlipidemia associated with type 2 diabetes mellitus ([place])   4. Type 2 diabetes mellitus with complication, with long-term current use of insulin ([place])   5. Tobacco use   6. Medication management   7. Chronic obstructive pulmonary disease, unspecified COPD type ([place])      PLAN:  In order of problems listed above:  Chest pain associated with panic attacks and worsens alprazolam has been decreased by PCP.  Chest pain is constant and some relief from rubbing or nitroglycerin.  No exertional symptoms.  She says it is very different than her MI pain.  Will increase Imdur to 30 mg twice daily to see if this helps.  If it does not we will go back to 30 once daily.  CAD status post NSTEMI 06/26/2021 treated with PCI and angioplasty to a severe thrombotic stenosis of the bifurcation of the intermediate branch, this subbranch occlusion after angioplasty from distal embolization.  On aspirin and Brilinta for 1 year also Imdur for possible coronary spasm.  Increase Imdur to 30 mg twice daily to see if this helps.  Hypertension blood pressure well controlled  HLD on Lipitor.  Check fasting lipid panel  DM2 managed by PCP  Tobacco abuse back on smoking from 3 packs/day to 1 pack/day.  Recent CT scan shows smoking-related lung disease.  Will refer to pulmonary.  Anxiety/depression.  Meds being adjusted by PCP and she was told she needs a psychiatrist to receive any more medication.  She was referred to [facility] but they are  booked far out.  She like to be seen by someone here in town.  Shared Decision Making/Informed Consent        Medication Adjustments/Labs and Tests Ordered: Current medicines are reviewed at length with the patient today.  Concerns regarding medicines are outlined above.  Medication changes, Labs and Tests ordered today are listed in the Patient Instructions below. Patient Instructions  Medication Instructions:   Increase Imdur to 30 mg Two Times Daily   *If you need a refill on your cardiac medications before your next appointment, please call your pharmacy*   Lab Work: Your physician recommends that you return for lab work in: Fasting   If you have labs (blood work) drawn today and your tests are completely normal, you will receive your results only by: [place] (if you have MyChart) OR A paper copy in the mail If you have any lab test that is abnormal or we need to change your treatment, we will call you to review the results.   Testing/Procedures: NONE    Follow-Up: At [facility], you and your health needs are our priority.  As part of our continuing mission to provide you with exceptional heart care, we have created designated Provider Care Teams.  These Care Teams include your primary Cardiologist (physician) and Advanced Practice Providers (APPs -  Physician Assistants and Nurse Practitioners) who all work together to provide you with the care you need, when you need it.  We recommend signing up for the patient portal called "MyChart".  Sign up information is provided on this After Visit Summary.  MyChart is used to connect with patients for Virtual Visits (Telemedicine).  Patients are able to view lab/test results, encounter notes, upcoming appointments, etc.  Non-urgent messages can be sent to your provider as well.   To learn more about what you can do with MyChart, go to [email or web address].    Your next appointment:   2 month(s)  The format for your  next appointment:   In Person  Provider:   Carlyle Dolly, MD    Other Instructions Thank you for choosing [facility]!     Signed, Katelyn Barrios, PA-C  09/13/2021 2:18 PM    [facility] [address], [place], [place]  [address] Phone: [phone]; Fax: [phone]

## 2021-09-10 ENCOUNTER — Ambulatory Visit (HOSPITAL_COMMUNITY)
Admission: RE | Admit: 2021-09-10 | Discharge: 2021-09-10 | Disposition: A | Discharge status: Home / Self Care | Payer: Medicare Other | Source: Ambulatory Visit | Attending: Family Medicine | Admitting: Family Medicine

## 2021-09-10 ENCOUNTER — Other Ambulatory Visit: Payer: Self-pay

## 2021-09-10 DIAGNOSIS — R0789 Other chest pain: Secondary | ICD-10-CM | POA: Insufficient documentation

## 2021-09-10 DIAGNOSIS — R062 Wheezing: Secondary | ICD-10-CM | POA: Diagnosis present

## 2021-09-10 DIAGNOSIS — R918 Other nonspecific abnormal finding of lung field: Secondary | ICD-10-CM | POA: Diagnosis present

## 2021-09-10 DIAGNOSIS — R0609 Other forms of dyspnea: Secondary | ICD-10-CM | POA: Insufficient documentation

## 2021-09-13 ENCOUNTER — Encounter: Payer: Self-pay | Admitting: Physician Assistant

## 2021-09-13 ENCOUNTER — Ambulatory Visit (INDEPENDENT_AMBULATORY_CARE_PROVIDER_SITE_OTHER): Payer: Medicare Other | Admitting: Physician Assistant

## 2021-09-13 ENCOUNTER — Other Ambulatory Visit: Payer: Self-pay

## 2021-09-13 VITALS — BP 110/76 | HR 94 | Ht 61.5 in | Wt 179.2 lb

## 2021-09-13 DIAGNOSIS — E118 Type 2 diabetes mellitus with unspecified complications: Secondary | ICD-10-CM

## 2021-09-13 DIAGNOSIS — I2510 Atherosclerotic heart disease of native coronary artery without angina pectoris: Secondary | ICD-10-CM

## 2021-09-13 DIAGNOSIS — E1169 Type 2 diabetes mellitus with other specified complication: Secondary | ICD-10-CM | POA: Diagnosis not present

## 2021-09-13 DIAGNOSIS — J449 Chronic obstructive pulmonary disease, unspecified: Secondary | ICD-10-CM

## 2021-09-13 DIAGNOSIS — I10 Essential (primary) hypertension: Secondary | ICD-10-CM

## 2021-09-13 DIAGNOSIS — Z720 Tobacco use: Secondary | ICD-10-CM

## 2021-09-13 DIAGNOSIS — E785 Hyperlipidemia, unspecified: Secondary | ICD-10-CM

## 2021-09-13 DIAGNOSIS — Z79899 Other long term (current) drug therapy: Secondary | ICD-10-CM

## 2021-09-13 DIAGNOSIS — Z794 Long term (current) use of insulin: Secondary | ICD-10-CM

## 2021-09-13 MED ORDER — ISOSORBIDE MONONITRATE ER 30 MG PO TB24: 30.0000 mg | ORAL_TABLET | Freq: Two times a day (BID) | ORAL | 3 refills | Status: DC

## 2021-09-13 NOTE — Patient Instructions (Signed)
Medication Instructions:   Increase Imdur to 30 mg Two Times Daily   *If you need a refill on your cardiac medications before your next appointment, please call your pharmacy*   Lab Work: Your physician recommends that you return for lab work in: Fasting   If you have labs (blood work) drawn today and your tests are completely normal, you will receive your results only by: [place] (if you have MyChart) OR A paper copy in the mail If you have any lab test that is abnormal or we need to change your treatment, we will call you to review the results.   Testing/Procedures: NONE    Follow-Up: At [facility], you and your health needs are our priority.  As part of our continuing mission to provide you with exceptional heart care, we have created designated Provider Care Teams.  These Care Teams include your primary Cardiologist (physician) and Advanced Practice Providers (APPs -  Physician Assistants and Nurse Practitioners) who all work together to provide you with the care you need, when you need it.  We recommend signing up for the patient portal called "MyChart".  Sign up information is provided on this After Visit Summary.  MyChart is used to connect with patients for Virtual Visits (Telemedicine).  Patients are able to view lab/test results, encounter notes, upcoming appointments, etc.  Non-urgent messages can be sent to your provider as well.   To learn more about what you can do with MyChart, go to [email or web address].    Your next appointment:   2 month(s)  The format for your next appointment:   In Person  Provider:   Carlyle Dolly, MD    Other Instructions Thank you for choosing [facility]!

## 2021-09-16 ENCOUNTER — Other Ambulatory Visit: Payer: Self-pay

## 2021-09-16 ENCOUNTER — Encounter (HOSPITAL_COMMUNITY)
Admission: RE | Admit: 2021-09-16 | Discharge: 2021-09-16 | Disposition: A | Discharge status: Home / Self Care | Payer: Medicare Other | Source: Ambulatory Visit | Attending: Cardiology | Admitting: Cardiology

## 2021-09-16 ENCOUNTER — Other Ambulatory Visit: Payer: Self-pay | Admitting: *Deleted

## 2021-09-16 ENCOUNTER — Encounter (HOSPITAL_COMMUNITY): Payer: Self-pay

## 2021-09-16 VITALS — BP 124/70 | HR 81 | Ht 61.5 in | Wt 178.1 lb

## 2021-09-16 DIAGNOSIS — I214 Non-ST elevation (NSTEMI) myocardial infarction: Secondary | ICD-10-CM | POA: Insufficient documentation

## 2021-09-16 DIAGNOSIS — Z9861 Coronary angioplasty status: Secondary | ICD-10-CM | POA: Diagnosis present

## 2021-09-16 LAB — GLUCOSE, CAPILLARY: Glucose-Capillary: 200 mg/dL — ABNORMAL HIGH (ref 70–99)

## 2021-09-16 MED ORDER — METOPROLOL TARTRATE 25 MG PO TABS: 12.5000 mg | ORAL_TABLET | Freq: Two times a day (BID) | ORAL | 3 refills | Status: DC

## 2021-09-16 NOTE — Progress Notes (Signed)
Cardiac Individual Treatment Plan  Patient Details  Name: Katelyn Kelly MRN: [id number] Date of Birth: [birth year] Referring Provider:   Flowsheet Row CARDIAC REHAB PHASE II ORIENTATION from 09/16/2021 in [facility]  Referring Provider Dr. Harl Bowie       Initial Encounter Date:  Flowsheet Row CARDIAC REHAB PHASE II ORIENTATION from 09/16/2021 in [facility]  Date 09/16/21       Visit Diagnosis: NSTEMI (non-ST elevated myocardial infarction) ([place])  S/P PTCA (percutaneous transluminal coronary angioplasty)  Patient's Home Medications on Admission:  Current Outpatient Medications:    acetaminophen (TYLENOL) 650 MG CR tablet, Take 1,300 mg by mouth every 8 (eight) hours as needed for pain., Disp: , Rfl:    ALPRAZolam (XANAX) 0.5 MG tablet, Take 1 tablet (0.5 mg total) by mouth in the morning, at noon, in the evening, and at bedtime. (Patient taking differently: Take 0.5 mg by mouth 2 (two) times daily.), Disp: , Rfl:    aspirin 81 MG EC tablet, Take 1 tablet (81 mg total) by mouth daily. Swallow whole. (Patient taking differently: Take 81 mg by mouth at bedtime. Swallow whole.), Disp: 90 tablet, Rfl: 2   atorvastatin (LIPITOR) 80 MG tablet, Take 1 tablet (80 mg total) by mouth at bedtime., Disp: 90 tablet, Rfl: 1   busPIRone (BUSPAR) 7.5 MG tablet, Take by mouth See admin instructions. Take 0.5 tablets (3.75 mg total) by mouth 3 times daily for 7 days, THEN 1 tablet (7.5 mg total) 3 times daily for 7 days, THEN 2 tablets (15 mg total) 3 times daily, Disp: , Rfl:    calcium carbonate (TUMS - DOSED IN MG ELEMENTAL CALCIUM) 500 MG chewable tablet, Chew 1,000 mg by mouth daily as needed for indigestion or heartburn., Disp: , Rfl:    cyclobenzaprine (FLEXERIL) 5 MG tablet, Take 5 mg by mouth 3 (three) times daily as needed for muscle spasms., Disp: , Rfl:    empagliflozin (JARDIANCE) 10 MG TABS tablet, Take 1 tablet (10 mg total) by mouth daily  before breakfast., Disp: 30 tablet, Rfl: 11   insulin lispro (HUMALOG) 100 UNIT/ML KwikPen, Inject 2-4 Units into the skin See admin instructions. Inject 2 to 4 units 3 times daily. May inject a 4th dose as needed for high blood sugar, Disp: , Rfl:    isosorbide mononitrate (IMDUR) 30 MG 24 hr tablet, Take 1 tablet (30 mg total) by mouth 2 (two) times daily., Disp: 180 tablet, Rfl: 3   Menthol, Topical Analgesic, (BIOFREEZE) 10 % LIQD, Apply 1 application topically daily as needed (pain)., Disp: , Rfl:    metoprolol tartrate (LOPRESSOR) 25 MG tablet, Take 0.5 tablets (12.5 mg total) by mouth 2 (two) times daily., Disp: 90 tablet, Rfl: 0   nicotine (NICODERM CQ - DOSED IN MG/24 HOURS) 21 mg/24hr patch, Place 1 patch (21 mg total) onto the skin daily., Disp: 28 patch, Rfl: 0   nitroGLYCERIN (NITROSTAT) 0.4 MG SL tablet, Place 1 tablet (0.4 mg total) under the tongue every 5 (five) minutes x 3 doses as needed for chest pain., Disp: 25 tablet, Rfl: 2   ondansetron (ZOFRAN-ODT) 4 MG disintegrating tablet, Take 1 tablet (4 mg total) by mouth every 8 (eight) hours as needed for nausea or vomiting., Disp: 30 tablet, Rfl: 2   pramipexole (MIRAPEX) 0.25 MG tablet, Take 0.25 mg by mouth at bedtime., Disp: , Rfl:    ticagrelor (BRILINTA) 90 MG TABS tablet, Take 1 tablet (90 mg total) by mouth 2 (two) times  daily., Disp: 180 tablet, Rfl: 2   topiramate (TOPAMAX) 50 MG tablet, Take 50 mg by mouth 2 (two) times daily., Disp: , Rfl:    TRESIBA FLEXTOUCH 100 UNIT/ML SOPN FlexTouch Pen, Inject 40 Units into the skin at bedtime., Disp: , Rfl:    cephALEXin (KEFLEX) 500 MG capsule, Take 1 capsule (500 mg total) by mouth 2 (two) times daily. (Patient not taking: No sig reported), Disp: 10 capsule, Rfl: 0   fluconazole (DIFLUCAN) 150 MG tablet, Take one tablet by mouth as a single dose. May repeat in 3 days if symptoms persist. (Patient not taking: No sig reported), Disp: 2 tablet, Rfl: 0  Past Medical History: Past  Medical History:  Diagnosis Date   Anxiety and depression    Bilateral carpal tunnel syndrome    Chronic back pain    Chronic neck pain    Depression    Diabetes mellitus    GERD (gastroesophageal reflux disease)    H/O syncope    Headache    History of panic attacks    Hypertension    IBS (irritable bowel syndrome)    Manic depression (HCC)    Migraine    Panic attack     Tobacco Use: Social History   Tobacco Use  Smoking Status Every Day   Packs/day: 1.00   Years: 40.00   Pack years: 40.00   Types: Cigarettes  Smokeless Tobacco Never  Tobacco Comments   one pack a day. Pt has purchased nicotine patches. She has a plan to stop and is going to set a quit date.     Labs: Recent Review Flowsheet Data     Labs for ITP Cardiac and Pulmonary Rehab Latest Ref Rng & Units 10/21/2011 08/29/2017 10/13/2018 06/26/2021   Cholestrol 0 - 200 mg/dL - - - 193   LDLCALC 0 - 99 mg/dL - - - 105(H)   HDL >40 mg/dL - - - 56   Trlycerides <150 mg/dL - - - 158(H)   Hemoglobin A1c 4.8 - 5.6 % - 6.7(H) - 10.4(H)   TCO2 22 - 32 mmol/L 28 - 27 -       Capillary Blood Glucose: Lab Results  Component Value Date   GLUCAP 200 (H) 09/16/2021   GLUCAP 277 (H) 06/29/2021   GLUCAP 188 (H) 06/29/2021   GLUCAP 131 (H) 06/28/2021   GLUCAP 116 (H) 06/28/2021    POCT Glucose     Row Name 09/16/21 1448             POCT Blood Glucose   Pre-Exercise 200 mg/dL                Exercise Target Goals: Exercise Program Goal: Individual exercise prescription set using results from initial 6 min walk test and THRR while considering  patient's activity barriers and safety.   Exercise Prescription Goal: Starting with aerobic activity 30 plus minutes a day, 3 days per week for initial exercise prescription. Provide home exercise prescription and guidelines that participant acknowledges understanding prior to discharge.  Activity Barriers & Risk Stratification:  Activity Barriers & Cardiac  Risk Stratification - 09/16/21 1317       Activity Barriers & Cardiac Risk Stratification   Activity Barriers Back Problems;Deconditioning;Shortness of Breath;Chest Pain/Angina    Cardiac Risk Stratification High             6 Minute Walk:  6 Minute Walk     Row Name 09/16/21 1419 09/16/21 1441       6  Minute Walk   Phase Initial (P)  Initial    Distance 1000 feet (P)  1000 feet    Walk Time 6 minutes (P)  6 minutes    # of Rest Breaks 0 (P)  0    MPH 1.9 (P)  1.9    METS 2.6 (P)  2.6    RPE 12 (P)  12    VO2 Peak 9.11 (P)  9.11    Symptoms No (P)  No    Resting HR -- 81 bpm    Resting BP -- 124/70    Resting Oxygen Saturation  -- 98 %    Exercise Oxygen Saturation  during 6 min walk -- 98 %    Max Ex. HR -- 110 bpm    Max Ex. BP -- 140/75    2 Minute Post BP -- 128/70             Oxygen Initial Assessment:   Oxygen Re-Evaluation:   Oxygen Discharge (Final Oxygen Re-Evaluation):   Initial Exercise Prescription:  Initial Exercise Prescription - 09/16/21 1400       Date of Initial Exercise RX and Referring Provider   Date 09/16/21    Referring Provider Dr. Harl Bowie    Expected Discharge Date 12/10/21      NuStep   Level 1    SPM 60    Minutes 39      Prescription Details   Frequency (times per week) 3    Duration Progress to 30 minutes of continuous aerobic without signs/symptoms of physical distress      Intensity   THRR 40-80% of Max Heartrate 63-126    Ratings of Perceived Exertion 11-13    Perceived Dyspnea 0-4      Resistance Training   Training Prescription Yes    Weight 3    Reps 10-15             Perform Capillary Blood Glucose checks as needed.  Exercise Prescription Changes:   Exercise Comments:   Exercise Goals and Review:   Exercise Goals     Row Name 09/16/21 1445             Exercise Goals   Increase Physical Activity Yes       Intervention Provide advice, education, support and counseling about  physical activity/exercise needs.;Develop an individualized exercise prescription for aerobic and resistive training based on initial evaluation findings, risk stratification, comorbidities and participant's personal goals.       Expected Outcomes Short Term: Attend rehab on a regular basis to increase amount of physical activity.;Long Term: Add in home exercise to make exercise part of routine and to increase amount of physical activity.;Long Term: Exercising regularly at least 3-5 days a week.       Increase Strength and Stamina Yes       Intervention Provide advice, education, support and counseling about physical activity/exercise needs.;Develop an individualized exercise prescription for aerobic and resistive training based on initial evaluation findings, risk stratification, comorbidities and participant's personal goals.       Expected Outcomes Short Term: Increase workloads from initial exercise prescription for resistance, speed, and METs.;Short Term: Perform resistance training exercises routinely during rehab and add in resistance training at home;Long Term: Improve cardiorespiratory fitness, muscular endurance and strength as measured by increased METs and functional capacity (6MWT)       Able to understand and use rate of perceived exertion (RPE) scale Yes       Intervention Provide education and  explanation on how to use RPE scale       Expected Outcomes Short Term: Able to use RPE daily in rehab to express subjective intensity level;Long Term:  Able to use RPE to guide intensity level when exercising independently       Knowledge and understanding of Target Heart Rate Range (THRR) Yes       Intervention Provide education and explanation of THRR including how the numbers were predicted and where they are located for reference       Expected Outcomes Short Term: Able to state/look up THRR;Short Term: Able to use daily as guideline for intensity in rehab;Long Term: Able to use THRR to govern  intensity when exercising independently       Able to check pulse independently Yes       Intervention Provide education and demonstration on how to check pulse in carotid and radial arteries.;Review the importance of being able to check your own pulse for safety during independent exercise       Expected Outcomes Short Term: Able to explain why pulse checking is important during independent exercise;Long Term: Able to check pulse independently and accurately       Understanding of Exercise Prescription Yes       Intervention Provide education, explanation, and written materials on patient's individual exercise prescription       Expected Outcomes Short Term: Able to explain program exercise prescription;Long Term: Able to explain home exercise prescription to exercise independently                Exercise Goals Re-Evaluation :    Discharge Exercise Prescription (Final Exercise Prescription Changes):   Nutrition:  Target Goals: Understanding of nutrition guidelines, daily intake of sodium 1500mg , cholesterol 200mg , calories 30% from fat and 7% or less from saturated fats, daily to have 5 or more servings of fruits and vegetables.  Biometrics:  Pre Biometrics - 09/16/21 1440       Pre Biometrics   Height 5' 1.5" (1.562 m)    Weight 178 lb 2.1 oz (80.8 kg)    Waist Circumference 42.5 inches    Hip Circumference 45 inches    Waist to Hip Ratio 0.94 %    BMI (Calculated) 33.12    Triceps Skinfold 19 mm    % Body Fat 42.6 %    Grip Strength 20 kg    Flexibility 12 in    Single Leg Stand 12 seconds              Nutrition Therapy Plan and Nutrition Goals:   Nutrition Assessments:  Nutrition Assessments - 09/16/21 1323       MEDFICTS Scores   Pre Score 87            MEDIFICTS Score Key: ?70 Need to make dietary changes  40-70 Heart Healthy Diet ? 40 Therapeutic Level Cholesterol Diet   Picture Your Plate Scores: <10 Unhealthy dietary pattern with much  room for improvement. 41-50 Dietary pattern unlikely to meet recommendations for good health and room for improvement. 51-60 More healthful dietary pattern, with some room for improvement.  >60 Healthy dietary pattern, although there may be some specific behaviors that could be improved.    Nutrition Goals Re-Evaluation:   Nutrition Goals Discharge (Final Nutrition Goals Re-Evaluation):   Psychosocial: Target Goals: Acknowledge presence or absence of significant depression and/or stress, maximize coping skills, provide positive support system. Participant is able to verbalize types and ability to use techniques and skills needed for  reducing stress and depression.  Initial Review & Psychosocial Screening:  Initial Psych Review & Screening - 09/16/21 1319       Initial Review   Current issues with Current Depression;Current Anxiety/Panic;Current Sleep Concerns;Current Stress Concerns    Source of Stress Concerns Chronic Illness;Family    Comments Her brother and her son live with her. Her son is a recovering addict. Her brother has dementia and has poor hygiene. Her largest souce of stress is her health and her medications.      Family Dynamics   Good Support System? Yes    Comments She has one friend who supports her.      Barriers   Psychosocial barriers to participate in program The patient should benefit from training in stress management and relaxation.;Psychosocial barriers identified (see note)      Screening Interventions   Interventions Encouraged to exercise;To provide support and resources with identified psychosocial needs;Provide feedback about the scores to participant    Expected Outcomes Long Term goal: The participant improves quality of Life and PHQ9 Scores as seen by post scores and/or verbalization of changes;Short Term goal: Identification and review with participant of any Quality of Life or Depression concerns found by scoring the questionnaire.;Long Term Goal:  Stressors or current issues are controlled or eliminated.             Quality of Life Scores:  Quality of Life - 09/16/21 1446       Quality of Life   Select Quality of Life      Quality of Life Scores   Health/Function Pre 17.45 %    Socioeconomic Pre 18.5 %    Psych/Spiritual Pre 12.5 %    Family Pre 30 %    GLOBAL Pre 19.22 %            Scores of 19 and below usually indicate a poorer quality of life in these areas.  A difference of  2-3 points is a clinically meaningful difference.  A difference of 2-3 points in the total score of the Quality of Life Index has been associated with significant improvement in overall quality of life, self-image, physical symptoms, and general health in studies assessing change in quality of life.  PHQ-9: Recent Review Flowsheet Data     Depression screen [facility] 2/9 09/16/2021   Decreased Interest 3   Down, Depressed, Hopeless 3   PHQ - 2 Score 6   Altered sleeping 2   Tired, decreased energy 3   Change in appetite 3    Feeling bad or failure about yourself  3   Trouble concentrating 2   Moving slowly or fidgety/restless 0   Suicidal thoughts 0   PHQ-9 Score 19   Difficult doing work/chores Very difficult      Interpretation of Total Score  Total Score Depression Severity:  1-4 = Minimal depression, 5-9 = Mild depression, 10-14 = Moderate depression, 15-19 = Moderately severe depression, 20-27 = Severe depression   Psychosocial Evaluation and Intervention:  Psychosocial Evaluation - 09/16/21 1437       Psychosocial Evaluation & Interventions   Interventions Stress management education;Relaxation education;Encouraged to exercise with the program and follow exercise prescription    Comments Pt's transportation could be a barrier to participation in rehab. She reports that her car is unreliable but it should be okay to come to rehab. She has several identfiable psychosocial issues. She has anxiety and depression which is treated  with xanax and buspar. She reports that her PCP  recently cut her xanax prescription in half and that she has felt more depressed since this. She was seeing a psychiatrist in [place] but she reports that she had a panic attack during an appointment and that the psychiatrist will no longer see her. She has been referred to another psychiatrist in [place] but states that this is too far for her to drive. She also states that [facility] would be too far to drive as well. She scored a 19 on her PHQ-9. She reports little interest in doing things and feeling depressed almost everyday. Her only support system is one of her friend. She has several sources of stress. Her primary source is her health. Along with her heart issues, she also has poorly controlled DM and she has recently been referred to pulmonlogy due to a suspected pulmonary conditon. Her living situation causes her stress. Her son and brother both live with her. She reports that her son is a recovering addict and currently does not have a job to help pay bills. She reports that her brother has early stage dementia and also has terrible personal hygiene. She is hopeful that once her son gets a job that he can move out. She is currently smoking about a pack of cigarettes per day. She has cut back from three packs per day before her NSTEMI. She has a plan to stop. She just bought nicotine patches and will call 1800quitnow if she needs assistance. She is going to pick a quit date soon. She reports that both her brother and son smoke cigarettes in her house which could be trigger for her to relapse. She reports that she will tell them that it is her house and if they want to live there they will have to smoke outside. She in confident in her plan. She is very nervous about starting the program but she does have a positive outlook that it will help her.    Expected Outcomes Pt's anxiety and depression will be better contolled through medication and patient will  see a psychiatrist. Her stressors will be reduced or eliminated. She will follow through with her smoking cessation plan.    Continue Psychosocial Services  Follow up required by staff             Psychosocial Re-Evaluation:   Psychosocial Discharge (Final Psychosocial Re-Evaluation):   Vocational Rehabilitation: Provide vocational rehab assistance to qualifying candidates.   Vocational Rehab Evaluation & Intervention:  Vocational Rehab - 09/16/21 1335       Initial Vocational Rehab Evaluation & Intervention   Assessment shows need for Vocational Rehabilitation No             Education: Education Goals: Education classes will be provided on a weekly basis, covering required topics. Participant will state understanding/return demonstration of topics presented.  Learning Barriers/Preferences:  Learning Barriers/Preferences - 09/16/21 1329       Learning Barriers/Preferences   Learning Barriers Sight    Learning Preferences Audio             Education Topics: Hypertension, Hypertension Reduction -Define heart disease and high blood pressure. Discus how high blood pressure affects the body and ways to reduce high blood pressure.   Exercise and Your Heart -Discuss why it is important to exercise, the FITT principles of exercise, normal and abnormal responses to exercise, and how to exercise safely.   Angina -Discuss definition of angina, causes of angina, treatment of angina, and how to decrease risk of having angina.   Cardiac  Medications -Review what the following cardiac medications are used for, how they affect the body, and side effects that may occur when taking the medications.  Medications include Aspirin, Beta blockers, calcium channel blockers, ACE Inhibitors, angiotensin receptor blockers, diuretics, digoxin, and antihyperlipidemics.   Congestive Heart Failure -Discuss the definition of CHF, how to live with CHF, the signs and symptoms of CHF,  and how keep track of weight and sodium intake.   Heart Disease and Intimacy -Discus the effect sexual activity has on the heart, how changes occur during intimacy as we age, and safety during sexual activity.   Smoking Cessation / COPD -Discuss different methods to quit smoking, the health benefits of quitting smoking, and the definition of COPD.   Nutrition I: Fats -Discuss the types of cholesterol, what cholesterol does to the heart, and how cholesterol levels can be controlled.   Nutrition II: Labels -Discuss the different components of food labels and how to read food label   Heart Parts/Heart Disease and PAD -Discuss the anatomy of the heart, the pathway of blood circulation through the heart, and these are affected by heart disease.   Stress I: Signs and Symptoms -Discuss the causes of stress, how stress may lead to anxiety and depression, and ways to limit stress.   Stress II: Relaxation -Discuss different types of relaxation techniques to limit stress.   Warning Signs of Stroke / TIA -Discuss definition of a stroke, what the signs and symptoms are of a stroke, and how to identify when someone is having stroke.   Knowledge Questionnaire Score:  Knowledge Questionnaire Score - 09/16/21 1329       Knowledge Questionnaire Score   Pre Score 19/28             Core Components/Risk Factors/Patient Goals at Admission:  Personal Goals and Risk Factors at Admission - 09/16/21 1335       Core Components/Risk Factors/Patient Goals on Admission    Weight Management Yes;Weight Loss;Obesity    Intervention Weight Management/Obesity: Establish reasonable short term and long term weight goals.;Obesity: Provide education and appropriate resources to help participant work on and attain dietary goals.;Weight Management: Develop a combined nutrition and exercise program designed to reach desired caloric intake, while maintaining appropriate intake of nutrient and fiber,  sodium and fats, and appropriate energy expenditure required for the weight goal.;Weight Management: Provide education and appropriate resources to help participant work on and attain dietary goals.    Expected Outcomes Short Term: Continue to assess and modify interventions until short term weight is achieved;Long Term: Adherence to nutrition and physical activity/exercise program aimed toward attainment of established weight goal;Weight Loss: Understanding of general recommendations for a balanced deficit meal plan, which promotes 1-2 lb weight loss per week and includes a negative energy balance of [phone] kcal/d;Understanding recommendations for meals to include 15-35% energy as protein, 25-35% energy from fat, 35-60% energy from carbohydrates, less than 200mg  of dietary cholesterol, 20-35 gm of total fiber daily;Understanding of distribution of calorie intake throughout the day with the consumption of 4-5 meals/snacks;Weight Maintenance: Understanding of the daily nutrition guidelines, which includes 25-35% calories from fat, 7% or less cal from saturated fats, less than 200mg  cholesterol, less than 1.5gm of sodium, & 5 or more servings of fruits and vegetables daily    Tobacco Cessation Yes    Number of packs per day 1    Intervention Assist the participant in steps to quit. Provide individualized education and counseling about committing to Tobacco Cessation, relapse prevention, and  pharmacological support that can be provided by physician.;Advice worker, assist with locating and accessing local/national Quit Smoking programs, and support quit date choice.    Expected Outcomes Short Term: Will demonstrate readiness to quit, by selecting a quit date.;Short Term: Will quit all tobacco product use, adhering to prevention of relapse plan.;Long Term: Complete abstinence from all tobacco products for at least 12 months from quit date.    Improve shortness of breath with ADL's Yes     Intervention Provide education, individualized exercise plan and daily activity instruction to help decrease symptoms of SOB with activities of daily living.    Expected Outcomes Short Term: Improve cardiorespiratory fitness to achieve a reduction of symptoms when performing ADLs;Long Term: Be able to perform more ADLs without symptoms or delay the onset of symptoms    Diabetes Yes    Intervention Provide education about signs/symptoms and action to take for hypo/hyperglycemia.;Provide education about proper nutrition, including hydration, and aerobic/resistive exercise prescription along with prescribed medications to achieve blood glucose in normal ranges: Fasting glucose 65-99 mg/dL    Expected Outcomes Short Term: Participant verbalizes understanding of the signs/symptoms and immediate care of hyper/hypoglycemia, proper foot care and importance of medication, aerobic/resistive exercise and nutrition plan for blood glucose control.;Long Term: Attainment of HbA1C < 7%.    Hypertension Yes    Intervention Provide education on lifestyle modifcations including regular physical activity/exercise, weight management, moderate sodium restriction and increased consumption of fresh fruit, vegetables, and low fat dairy, alcohol moderation, and smoking cessation.;Monitor prescription use compliance.    Expected Outcomes Short Term: Continued assessment and intervention until BP is < 140/35mm HG in hypertensive participants. < 130/54mm HG in hypertensive participants with diabetes, heart failure or chronic kidney disease.;Long Term: Maintenance of blood pressure at goal levels.    Lipids Yes    Intervention Provide education and support for participant on nutrition & aerobic/resistive exercise along with prescribed medications to achieve LDL 70mg , HDL >40mg .    Expected Outcomes Short Term: Participant states understanding of desired cholesterol values and is compliant with medications prescribed. Participant is  following exercise prescription and nutrition guidelines.;Long Term: Cholesterol controlled with medications as prescribed, with individualized exercise RX and with personalized nutrition plan. Value goals: LDL < 70mg , HDL > 40 mg.    Stress Yes    Intervention Offer individual and/or small group education and counseling on adjustment to heart disease, stress management and health-related lifestyle change. Teach and support self-help strategies.    Expected Outcomes Short Term: Participant demonstrates changes in health-related behavior, relaxation and other stress management skills, ability to obtain effective social support, and compliance with psychotropic medications if prescribed.;Long Term: Emotional wellbeing is indicated by absence of clinically significant psychosocial distress or social isolation.             Core Components/Risk Factors/Patient Goals Review:    Core Components/Risk Factors/Patient Goals at Discharge (Final Review):    ITP Comments:   Comments: Patient arrived for 1st visit/orientation/education at 1230. Patient was referred to CR by Dr. Harl Bowie due to NSTEMI (I21.4) and S/P PTCA (Z98.61). During orientation advised patient on arrival and appointment times what to wear, what to do before, during and after exercise. Reviewed attendance and class policy.  Pt is scheduled to return Cardiac Rehab on 09/20/2021 at 1445. Pt was advised to come to class 15 minutes before class starts.  Discussed RPE/Dpysnea scales. Patient participated in warm up stretches. Patient was able to complete 6 minute walk test.  Telemetry: NSR  Patient was measured for the equipment. Discussed equipment safety with patient. Took patient pre-anthropometric measurements. Patient finished visit at 1415.

## 2021-09-20 ENCOUNTER — Encounter (HOSPITAL_COMMUNITY)
Admission: RE | Admit: 2021-09-20 | Discharge: 2021-09-20 | Disposition: A | Discharge status: Home / Self Care | Payer: Medicare Other | Source: Ambulatory Visit | Attending: Cardiology | Admitting: Cardiology

## 2021-09-20 VITALS — Wt 179.7 lb

## 2021-09-20 DIAGNOSIS — Z9861 Coronary angioplasty status: Secondary | ICD-10-CM

## 2021-09-20 DIAGNOSIS — I214 Non-ST elevation (NSTEMI) myocardial infarction: Secondary | ICD-10-CM

## 2021-09-20 LAB — GLUCOSE, CAPILLARY: Glucose-Capillary: 196 mg/dL — ABNORMAL HIGH (ref 70–99)

## 2021-09-20 NOTE — Progress Notes (Signed)
Daily Session Note  Patient Details  Name: GWENDELYN LANTING MRN: [id number] Date of Birth: 30-Aug-1961 Referring Provider:   Flowsheet Row CARDIAC REHAB PHASE II ORIENTATION from 09/16/2021 in [place]  Referring Provider Dr. Harl Bowie       Encounter Date: 09/20/2021  Check In:  Session Check In - 09/20/21 1445       Check-In   Supervising physician immediately available to respond to emergencies [facility] MD immediately available    Physician(s) Dr. Harl Bowie    Location [facility]    Staff Present Redge Gainer, BS, Exercise Physiologist;Markise Haymer Wynetta Emery, RN, BSN    Virtual Visit No    Medication changes reported     No    Fall or balance concerns reported    No    Tobacco Cessation No Change    Warm-up and Cool-down Performed as group-led instruction    Resistance Training Performed Yes    VAD Patient? No    PAD/SET Patient? No      Pain Assessment   Currently in Pain? No/denies    Pain Score 0-No pain    Multiple Pain Sites No             Capillary Blood Glucose: Results for orders placed or performed during the hospital encounter of 09/20/21 (from the past 24 hour(s))  Glucose, capillary     Status: Abnormal   Collection Time: 09/20/21  2:41 PM  Result Value Ref Range   Glucose-Capillary 196 (H) 70 - 99 mg/dL   Comment 1 Document in Chart       Social History   Tobacco Use  Smoking Status Every Day   Packs/day: 1.00   Years: 40.00   Pack years: 40.00   Types: Cigarettes  Smokeless Tobacco Never  Tobacco Comments   one pack a day. Pt has purchased nicotine patches. She has a plan to stop and is going to set a quit date.     Goals Met:  Independence with exercise equipment Exercise tolerated well No report of concerns or symptoms today Strength training completed today  Goals Unmet:  Not Applicable  Comments: Check out 1545.   Dr. Kathie Dike is Medical Director for [facility].

## 2021-09-22 ENCOUNTER — Encounter (HOSPITAL_COMMUNITY): Payer: Medicare Other

## 2021-09-24 ENCOUNTER — Encounter (HOSPITAL_COMMUNITY): Payer: Medicare Other

## 2021-09-27 ENCOUNTER — Encounter (HOSPITAL_COMMUNITY)
Admission: RE | Admit: 2021-09-27 | Discharge: 2021-09-27 | Disposition: A | Discharge status: Home / Self Care | Payer: Medicare Other | Source: Ambulatory Visit | Attending: Cardiology | Admitting: Cardiology

## 2021-09-27 DIAGNOSIS — Z9861 Coronary angioplasty status: Secondary | ICD-10-CM

## 2021-09-27 DIAGNOSIS — I214 Non-ST elevation (NSTEMI) myocardial infarction: Secondary | ICD-10-CM | POA: Diagnosis not present

## 2021-09-27 NOTE — Progress Notes (Signed)
Daily Session Note  Patient Details  Name: DANEKA LANTIGUA MRN: [id number] Date of Birth: [birth year] Referring Provider:   Flowsheet Row CARDIAC REHAB PHASE II ORIENTATION from 09/16/2021 in [place]  Referring Provider Dr. Harl Bowie       Encounter Date: 09/27/2021  Check In:  Session Check In - 09/27/21 1445       Check-In   Supervising physician immediately available to respond to emergencies [facility] MD immediately available    Physician(s) Dr. Johney Frame    Location [facility]    Staff Present Geanie Cooley, RN;Dalton Fletcher, MS, ACSM-CEP, Exercise Physiologist    Virtual Visit No    Medication changes reported     No    Fall or balance concerns reported    No    Tobacco Cessation No Change    Current number of cigarettes/nicotine per day     20    Warm-up and Cool-down Performed as group-led instruction    Resistance Training Performed Yes    VAD Patient? No    PAD/SET Patient? No      Pain Assessment   Currently in Pain? No/denies    Pain Score 0-No pain    Multiple Pain Sites No             Capillary Blood Glucose: No results found for this or any previous visit (from the past 24 hour(s)).    Social History   Tobacco Use  Smoking Status Every Day   Packs/day: 1.00   Years: 40.00   Pack years: 40.00   Types: Cigarettes  Smokeless Tobacco Never  Tobacco Comments   one pack a day. Pt has purchased nicotine patches. She has a plan to stop and is going to set a quit date.     Goals Met:  Independence with exercise equipment Exercise tolerated well No report of concerns or symptoms today Strength training completed today  Goals Unmet:  Not Applicable  Comments: check out @ 3:45pm   Dr. Kathie Dike is Medical Director for [facility].

## 2021-09-29 ENCOUNTER — Encounter (HOSPITAL_COMMUNITY)
Admission: RE | Admit: 2021-09-29 | Discharge: 2021-09-29 | Disposition: A | Discharge status: Home / Self Care | Payer: Medicare Other | Source: Ambulatory Visit | Attending: Cardiology | Admitting: Cardiology

## 2021-09-29 DIAGNOSIS — I214 Non-ST elevation (NSTEMI) myocardial infarction: Secondary | ICD-10-CM | POA: Diagnosis not present

## 2021-09-29 DIAGNOSIS — Z9861 Coronary angioplasty status: Secondary | ICD-10-CM

## 2021-09-29 NOTE — Progress Notes (Signed)
Daily Session Note  Patient Details  Name: Katelyn Kelly MRN: [id number] Date of Birth: 07/12/1961 Referring Provider:   Flowsheet Row CARDIAC REHAB PHASE II ORIENTATION from 09/16/2021 in [place] [facility]  Referring Provider Dr. Branch       Encounter Date: 09/29/2021  Check In:  Session Check In - 09/29/21 1443       Check-In   Supervising physician immediately available to respond to emergencies [facility] MD immediately available    Physician(s) Dr. McDowell    Location [facility]    Staff Present Phyllis Billingsley, RN;Heather Jachimiak, BS, Exercise Physiologist    Virtual Visit No    Medication changes reported     No    Fall or balance concerns reported    No    Tobacco Cessation No Change    Current number of cigarettes/nicotine per day     20    Warm-up and Cool-down Performed as group-led instruction    Resistance Training Performed Yes    VAD Patient? No    PAD/SET Patient? No      Pain Assessment   Currently in Pain? No/denies    Pain Score 0-No pain    Multiple Pain Sites No             Capillary Blood Glucose: No results found for this or any previous visit (from the past 24 hour(s)).    Social History   Tobacco Use  Smoking Status Every Day   Packs/day: 1.00   Years: 40.00   Pack years: 40.00   Types: Cigarettes  Smokeless Tobacco Never  Tobacco Comments   one pack a day. Pt has purchased nicotine patches. She has a plan to stop and is going to set a quit date.     Goals Met:  Independence with exercise equipment Exercise tolerated well No report of concerns or symptoms today Strength training completed today  Goals Unmet:  Not Applicable  Comments: check out at 3:45pm   Dr. Jehanzeb Memon is Medical Director for [facility]. 

## 2021-10-01 ENCOUNTER — Other Ambulatory Visit: Payer: Self-pay

## 2021-10-01 ENCOUNTER — Encounter (HOSPITAL_COMMUNITY)
Admission: RE | Admit: 2021-10-01 | Discharge: 2021-10-01 | Disposition: A | Discharge status: Home / Self Care | Payer: Medicare Other | Source: Ambulatory Visit | Attending: Cardiology | Admitting: Cardiology

## 2021-10-01 DIAGNOSIS — Z9861 Coronary angioplasty status: Secondary | ICD-10-CM | POA: Insufficient documentation

## 2021-10-01 DIAGNOSIS — I214 Non-ST elevation (NSTEMI) myocardial infarction: Secondary | ICD-10-CM | POA: Insufficient documentation

## 2021-10-01 NOTE — Progress Notes (Signed)
Daily Session Note  Patient Details  Name: Katelyn Kelly MRN: [id number] Date of Birth: [birth year] Referring Provider:   Flowsheet Row CARDIAC REHAB PHASE II ORIENTATION from 09/16/2021 in [place]  Referring Provider Dr. Harl Bowie       Encounter Date: 10/01/2021  Check In:  Session Check In - 10/01/21 1445       Check-In   Supervising physician immediately available to respond to emergencies [facility] MD immediately available    Physician(s) Dr. Domenic Polite    Location [facility]    Staff Present Geanie Cooley, RN;Heather Otho Ket, BS, Exercise Physiologist;Debra Wynetta Emery, RN, BSN    Virtual Visit No    Medication changes reported     No    Fall or balance concerns reported    No    Tobacco Cessation No Change    Current number of cigarettes/nicotine per day     15    Warm-up and Cool-down Performed as group-led instruction    Resistance Training Performed Yes    VAD Patient? No    PAD/SET Patient? No      Pain Assessment   Currently in Pain? No/denies    Pain Score 0-No pain    Multiple Pain Sites No             Capillary Blood Glucose: No results found for this or any previous visit (from the past 24 hour(s)).    Social History   Tobacco Use  Smoking Status Every Day   Packs/day: 1.00   Years: 40.00   Pack years: 40.00   Types: Cigarettes  Smokeless Tobacco Never  Tobacco Comments   one pack a day. Pt has purchased nicotine patches. She has a plan to stop and is going to set a quit date.     Goals Met:  Independence with exercise equipment Exercise tolerated well No report of concerns or symptoms today Strength training completed today  Goals Unmet:  Not Applicable  Comments: check out @  3:45pm   Dr. Kathie Dike is Medical Director for [facility] Pul[facility] Re[facility].

## 2021-10-04 ENCOUNTER — Encounter (HOSPITAL_COMMUNITY)
Admission: RE | Admit: 2021-10-04 | Discharge: 2021-10-04 | Disposition: A | Discharge status: Home / Self Care | Payer: Medicare Other | Source: Ambulatory Visit | Attending: Cardiology | Admitting: Cardiology

## 2021-10-04 ENCOUNTER — Other Ambulatory Visit: Payer: Self-pay

## 2021-10-04 VITALS — Wt 177.2 lb

## 2021-10-04 DIAGNOSIS — Z9861 Coronary angioplasty status: Secondary | ICD-10-CM | POA: Diagnosis not present

## 2021-10-04 DIAGNOSIS — I214 Non-ST elevation (NSTEMI) myocardial infarction: Secondary | ICD-10-CM

## 2021-10-04 NOTE — Progress Notes (Signed)
Daily Session Note  Patient Details  Name: Katelyn Kelly MRN: [id number] Date of Birth: [birth year] Referring Provider:   Flowsheet Row CARDIAC REHAB PHASE II ORIENTATION from 09/16/2021 in [place]  Referring Provider Dr. Harl Bowie       Encounter Date: 10/04/2021  Check In:  Session Check In - 10/04/21 1445       Check-In   Supervising physician immediately available to respond to emergencies [facility] MD immediately available    Physician(s) Dr. Harl Bowie    Location [facility]    Staff Present Geanie Cooley, RN;Ivery Nanney Otho Ket, BS, Exercise Physiologist;Dalton Kris Mouton, MS, ACSM-CEP, Exercise Physiologist    Virtual Visit No    Medication changes reported     No    Fall or balance concerns reported    No    Tobacco Cessation No Change    Warm-up and Cool-down Performed as group-led instruction    Resistance Training Performed Yes    VAD Patient? No    PAD/SET Patient? No      Pain Assessment   Currently in Pain? No/denies    Pain Score 0-No pain    Multiple Pain Sites No             Capillary Blood Glucose: No results found for this or any previous visit (from the past 24 hour(s)).    Social History   Tobacco Use  Smoking Status Every Day   Packs/day: 1.00   Years: 40.00   Pack years: 40.00   Types: Cigarettes  Smokeless Tobacco Never  Tobacco Comments   one pack a day. Pt has purchased nicotine patches. She has a plan to stop and is going to set a quit date.     Goals Met:  Independence with exercise equipment Exercise tolerated well No report of concerns or symptoms today Strength training completed today  Goals Unmet:  Not Applicable  Comments: check out 1545   Dr. Kathie Dike is Medical Director for [facility] Pul[facility] Re[facility].

## 2021-10-06 ENCOUNTER — Encounter (HOSPITAL_COMMUNITY)
Admission: RE | Admit: 2021-10-06 | Discharge: 2021-10-06 | Disposition: A | Discharge status: Home / Self Care | Payer: Medicare Other | Source: Ambulatory Visit | Attending: Cardiology | Admitting: Cardiology

## 2021-10-06 DIAGNOSIS — Z9861 Coronary angioplasty status: Secondary | ICD-10-CM | POA: Diagnosis not present

## 2021-10-06 DIAGNOSIS — I214 Non-ST elevation (NSTEMI) myocardial infarction: Secondary | ICD-10-CM

## 2021-10-06 NOTE — Progress Notes (Signed)
Daily Session Note  Patient Details  Name: Katelyn Kelly MRN: [id number] Date of Birth: [birth year] Referring Provider:   Flowsheet Row CARDIAC REHAB PHASE II ORIENTATION from 09/16/2021 in [place]  Referring Provider Dr. Harl Bowie       Encounter Date: 10/06/2021  Check In:  Session Check In - 10/06/21 1442       Check-In   Supervising physician immediately available to respond to emergencies [facility] MD immediately available    Physician(s) Dr. Harl Bowie    Location [facility]    Staff Present Geanie Cooley, RN;Heather Otho Ket, BS, Exercise Physiologist;Dalton Kris Mouton, MS, ACSM-CEP, Exercise Physiologist    Virtual Visit No    Medication changes reported     No    Fall or balance concerns reported    No    Tobacco Cessation No Change    Current number of cigarettes/nicotine per day     15    Warm-up and Cool-down Performed as group-led instruction    Resistance Training Performed Yes    VAD Patient? No    PAD/SET Patient? No      Pain Assessment   Currently in Pain? No/denies    Pain Score 0-No pain    Multiple Pain Sites No             Capillary Blood Glucose: No results found for this or any previous visit (from the past 24 hour(s)).    Social History   Tobacco Use  Smoking Status Every Day   Packs/day: 1.00   Years: 40.00   Pack years: 40.00   Types: Cigarettes  Smokeless Tobacco Never  Tobacco Comments   one pack a day. Pt has purchased nicotine patches. She has a plan to stop and is going to set a quit date.     Goals Met:  Independence with exercise equipment Exercise tolerated well No report of concerns or symptoms today Strength training completed today  Goals Unmet:  Not Applicable  Comments: check out @ 3:45pm   Dr. Kathie Dike is Medical Director for [facility] Pul[facility] Re[facility].

## 2021-10-06 NOTE — Progress Notes (Signed)
Cardiac Individual Treatment Plan  Patient Details  Name: Katelyn Kelly MRN: [id number] Date of Birth: 02/14/1961 Referring Provider:   Flowsheet Row CARDIAC REHAB PHASE II ORIENTATION from 09/16/2021 in [place]  Referring Provider Dr. Harl Bowie       Initial Encounter Date:  Flowsheet Row CARDIAC REHAB PHASE II ORIENTATION from 09/16/2021 in [place]  Date 09/16/21       Visit Diagnosis: S/P PTCA (percutaneous transluminal coronary angioplasty)  NSTEMI (non-ST elevated myocardial infarction) ([place])  Patient's Home Medications on Admission:  Current Outpatient Medications:    acetaminophen (TYLENOL) 650 MG CR tablet, Take 1,300 mg by mouth every 8 (eight) hours as needed for pain., Disp: , Rfl:    ALPRAZolam (XANAX) 0.5 MG tablet, Take 1 tablet (0.5 mg total) by mouth in the morning, at noon, in the evening, and at bedtime. (Patient taking differently: Take 0.5 mg by mouth 2 (two) times daily.), Disp: , Rfl:    aspirin 81 MG EC tablet, Take 1 tablet (81 mg total) by mouth daily. Swallow whole. (Patient taking differently: Take 81 mg by mouth at bedtime. Swallow whole.), Disp: 90 tablet, Rfl: 2   atorvastatin (LIPITOR) 80 MG tablet, Take 1 tablet (80 mg total) by mouth at bedtime., Disp: 90 tablet, Rfl: 1   busPIRone (BUSPAR) 7.5 MG tablet, Take by mouth See admin instructions. Take 0.5 tablets (3.75 mg total) by mouth 3 times daily for 7 days, THEN 1 tablet (7.5 mg total) 3 times daily for 7 days, THEN 2 tablets (15 mg total) 3 times daily, Disp: , Rfl:    calcium carbonate (TUMS - DOSED IN MG ELEMENTAL CALCIUM) 500 MG chewable tablet, Chew 1,000 mg by mouth daily as needed for indigestion or heartburn., Disp: , Rfl:    cephALEXin (KEFLEX) 500 MG capsule, Take 1 capsule (500 mg total) by mouth 2 (two) times daily. (Patient not taking: No sig reported), Disp: 10 capsule, Rfl: 0   cyclobenzaprine (FLEXERIL) 5 MG tablet, Take 5 mg by  mouth 3 (three) times daily as needed for muscle spasms., Disp: , Rfl:    empagliflozin (JARDIANCE) 10 MG TABS tablet, Take 1 tablet (10 mg total) by mouth daily before breakfast., Disp: 30 tablet, Rfl: 11   fluconazole (DIFLUCAN) 150 MG tablet, Take one tablet by mouth as a single dose. May repeat in 3 days if symptoms persist. (Patient not taking: No sig reported), Disp: 2 tablet, Rfl: 0   insulin lispro (HUMALOG) 100 UNIT/ML KwikPen, Inject 2-4 Units into the skin See admin instructions. Inject 2 to 4 units 3 times daily. May inject a 4th dose as needed for high blood sugar, Disp: , Rfl:    isosorbide mononitrate (IMDUR) 30 MG 24 hr tablet, Take 1 tablet (30 mg total) by mouth 2 (two) times daily., Disp: 180 tablet, Rfl: 3   Menthol, Topical Analgesic, (BIOFREEZE) 10 % LIQD, Apply 1 application topically daily as needed (pain)., Disp: , Rfl:    metoprolol tartrate (LOPRESSOR) 25 MG tablet, Take 0.5 tablets (12.5 mg total) by mouth 2 (two) times daily., Disp: 90 tablet, Rfl: 3   nicotine (NICODERM CQ - DOSED IN MG/24 HOURS) 21 mg/24hr patch, Place 1 patch (21 mg total) onto the skin daily., Disp: 28 patch, Rfl: 0   nitroGLYCERIN (NITROSTAT) 0.4 MG SL tablet, Place 1 tablet (0.4 mg total) under the tongue every 5 (five) minutes x 3 doses as needed for chest pain., Disp: 25 tablet, Rfl: 2   ondansetron (  ZOFRAN-ODT) 4 MG disintegrating tablet, Take 1 tablet (4 mg total) by mouth every 8 (eight) hours as needed for nausea or vomiting., Disp: 30 tablet, Rfl: 2   pramipexole (MIRAPEX) 0.25 MG tablet, Take 0.25 mg by mouth at bedtime., Disp: , Rfl:    ticagrelor (BRILINTA) 90 MG TABS tablet, Take 1 tablet (90 mg total) by mouth 2 (two) times daily., Disp: 180 tablet, Rfl: 2   topiramate (TOPAMAX) 50 MG tablet, Take 50 mg by mouth 2 (two) times daily., Disp: , Rfl:    TRESIBA FLEXTOUCH 100 UNIT/ML SOPN FlexTouch Pen, Inject 40 Units into the skin at bedtime., Disp: , Rfl:   Past Medical History: Past  Medical History:  Diagnosis Date   Anxiety and depression    Bilateral carpal tunnel syndrome    Chronic back pain    Chronic neck pain    Depression    Diabetes mellitus    GERD (gastroesophageal reflux disease)    H/O syncope    Headache    History of panic attacks    Hypertension    IBS (irritable bowel syndrome)    Manic depression (HCC)    Migraine    Panic attack     Tobacco Use: Social History   Tobacco Use  Smoking Status Every Day   Packs/day: 1.00   Years: 40.00   Pack years: 40.00   Types: Cigarettes  Smokeless Tobacco Never  Tobacco Comments   one pack a day. Pt has purchased nicotine patches. She has a plan to stop and is going to set a quit date.     Labs: Recent Review Flowsheet Data     Labs for ITP Cardiac and Pulmonary Rehab Latest Ref Rng & Units 10/21/2011 08/29/2017 10/13/2018 06/26/2021   Cholestrol 0 - 200 mg/dL - - - 193   LDLCALC 0 - 99 mg/dL - - - 105(H)   HDL >40 mg/dL - - - 56   Trlycerides <150 mg/dL - - - 158(H)   Hemoglobin A1c 4.8 - 5.6 % - 6.7(H) - 10.4(H)   TCO2 22 - 32 mmol/L 28 - 27 -       Capillary Blood Glucose: Lab Results  Component Value Date   GLUCAP 196 (H) 09/20/2021   GLUCAP 200 (H) 09/16/2021   GLUCAP 277 (H) 06/29/2021   GLUCAP 188 (H) 06/29/2021   GLUCAP 131 (H) 06/28/2021    POCT Glucose     Row Name 09/16/21 1448             POCT Blood Glucose   Pre-Exercise 200 mg/dL                Exercise Target Goals: Exercise Program Goal: Individual exercise prescription set using results from initial 6 min walk test and THRR while considering  patient's activity barriers and safety.   Exercise Prescription Goal: Starting with aerobic activity 30 plus minutes a day, 3 days per week for initial exercise prescription. Provide home exercise prescription and guidelines that participant acknowledges understanding prior to discharge.  Activity Barriers & Risk Stratification:  Activity Barriers & Cardiac  Risk Stratification - 09/16/21 1317       Activity Barriers & Cardiac Risk Stratification   Activity Barriers Back Problems;Deconditioning;Shortness of Breath;Chest Pain/Angina    Cardiac Risk Stratification High             6 Minute Walk:  6 Minute Walk     Row Name 09/16/21 1419 09/16/21 1441       6  Minute Walk   Phase Initial (P)  Initial    Distance 1000 feet (P)  1000 feet    Walk Time 6 minutes (P)  6 minutes    # of Rest Breaks 0 (P)  0    MPH 1.9 (P)  1.9    METS 2.6 (P)  2.6    RPE 12 (P)  12    VO2 Peak 9.11 (P)  9.11    Symptoms No (P)  No    Resting HR -- 81 bpm    Resting BP -- 124/70    Resting Oxygen Saturation  -- 98 %    Exercise Oxygen Saturation  during 6 min walk -- 98 %    Max Ex. HR -- 110 bpm    Max Ex. BP -- 140/75    2 Minute Post BP -- 128/70             Oxygen Initial Assessment:   Oxygen Re-Evaluation:   Oxygen Discharge (Final Oxygen Re-Evaluation):   Initial Exercise Prescription:  Initial Exercise Prescription - 09/16/21 1400       Date of Initial Exercise RX and Referring Provider   Date 09/16/21    Referring Provider Dr. Harl Bowie    Expected Discharge Date 12/10/21      NuStep   Level 1    SPM 60    Minutes 39      Prescription Details   Frequency (times per week) 3    Duration Progress to 30 minutes of continuous aerobic without signs/symptoms of physical distress      Intensity   THRR 40-80% of Max Heartrate 63-126    Ratings of Perceived Exertion 11-13    Perceived Dyspnea 0-4      Resistance Training   Training Prescription Yes    Weight 3    Reps 10-15             Perform Capillary Blood Glucose checks as needed.  Exercise Prescription Changes:   Exercise Prescription Changes     Row Name 09/20/21 1544 10/04/21 1534           Response to Exercise   Blood Pressure (Admit) 115/60 102/60      Blood Pressure (Exercise) 102/56 130/62      Blood Pressure (Exit) 108/62 96/58      Heart  Rate (Admit) 100 bpm 78 bpm      Heart Rate (Exercise) 120 bpm 100 bpm      Heart Rate (Exit) 109 bpm 87 bpm      Rating of Perceived Exertion (Exercise) 13 12      Duration Continue with 30 min of aerobic exercise without signs/symptoms of physical distress. Continue with 30 min of aerobic exercise without signs/symptoms of physical distress.      Intensity THRR unchanged THRR unchanged        Progression   Progression Continue to progress workloads to maintain intensity without signs/symptoms of physical distress. Continue to progress workloads to maintain intensity without signs/symptoms of physical distress.        Resistance Training   Training Prescription Yes Yes      Weight 3 3      Reps 10-15 10-15      Time 103 Minutes 10 Minutes        NuStep   Level 1 2      SPM 96 103      Minutes 36 39      METs 1.79 2.01  Exercise Comments:   Exercise Goals and Review:   Exercise Goals     Row Name 09/16/21 1445 10/05/21 1015           Exercise Goals   Increase Physical Activity Yes Yes      Intervention Provide advice, education, support and counseling about physical activity/exercise needs.;Develop an individualized exercise prescription for aerobic and resistive training based on initial evaluation findings, risk stratification, comorbidities and participant's personal goals. Provide advice, education, support and counseling about physical activity/exercise needs.;Develop an individualized exercise prescription for aerobic and resistive training based on initial evaluation findings, risk stratification, comorbidities and participant's personal goals.      Expected Outcomes Short Term: Attend rehab on a regular basis to increase amount of physical activity.;Long Term: Add in home exercise to make exercise part of routine and to increase amount of physical activity.;Long Term: Exercising regularly at least 3-5 days a week. Short Term: Attend rehab on a regular  basis to increase amount of physical activity.;Long Term: Add in home exercise to make exercise part of routine and to increase amount of physical activity.;Long Term: Exercising regularly at least 3-5 days a week.      Increase Strength and Stamina Yes Yes      Intervention Provide advice, education, support and counseling about physical activity/exercise needs.;Develop an individualized exercise prescription for aerobic and resistive training based on initial evaluation findings, risk stratification, comorbidities and participant's personal goals. Provide advice, education, support and counseling about physical activity/exercise needs.;Develop an individualized exercise prescription for aerobic and resistive training based on initial evaluation findings, risk stratification, comorbidities and participant's personal goals.      Expected Outcomes Short Term: Increase workloads from initial exercise prescription for resistance, speed, and METs.;Short Term: Perform resistance training exercises routinely during rehab and add in resistance training at home;Long Term: Improve cardiorespiratory fitness, muscular endurance and strength as measured by increased METs and functional capacity (6MWT) Short Term: Increase workloads from initial exercise prescription for resistance, speed, and METs.;Short Term: Perform resistance training exercises routinely during rehab and add in resistance training at home;Long Term: Improve cardiorespiratory fitness, muscular endurance and strength as measured by increased METs and functional capacity (6MWT)      Able to understand and use rate of perceived exertion (RPE) scale Yes Yes      Intervention Provide education and explanation on how to use RPE scale Provide education and explanation on how to use RPE scale      Expected Outcomes Short Term: Able to use RPE daily in rehab to express subjective intensity level;Long Term:  Able to use RPE to guide intensity level when  exercising independently Short Term: Able to use RPE daily in rehab to express subjective intensity level;Long Term:  Able to use RPE to guide intensity level when exercising independently      Knowledge and understanding of Target Heart Rate Range (THRR) Yes Yes      Intervention Provide education and explanation of THRR including how the numbers were predicted and where they are located for reference Provide education and explanation of THRR including how the numbers were predicted and where they are located for reference      Expected Outcomes Short Term: Able to state/look up THRR;Short Term: Able to use daily as guideline for intensity in rehab;Long Term: Able to use THRR to govern intensity when exercising independently Short Term: Able to state/look up THRR;Short Term: Able to use daily as guideline for intensity in rehab;Long Term: Able to use THRR  to govern intensity when exercising independently      Able to check pulse independently Yes Yes      Intervention Provide education and demonstration on how to check pulse in carotid and radial arteries.;Review the importance of being able to check your own pulse for safety during independent exercise Provide education and demonstration on how to check pulse in carotid and radial arteries.;Review the importance of being able to check your own pulse for safety during independent exercise      Expected Outcomes Short Term: Able to explain why pulse checking is important during independent exercise;Long Term: Able to check pulse independently and accurately Short Term: Able to explain why pulse checking is important during independent exercise;Long Term: Able to check pulse independently and accurately      Understanding of Exercise Prescription Yes Yes      Intervention Provide education, explanation, and written materials on patient's individual exercise prescription Provide education, explanation, and written materials on patient's individual exercise  prescription      Expected Outcomes Short Term: Able to explain program exercise prescription;Long Term: Able to explain home exercise prescription to exercise independently Short Term: Able to explain program exercise prescription;Long Term: Able to explain home exercise prescription to exercise independently               Exercise Goals Re-Evaluation :  Exercise Goals Re-Evaluation     [place] Name 10/04/21 1535             Exercise Goal Re-Evaluation   Exercise Goals Review Increase Physical Activity;Increase Strength and Stamina;Able to understand and use rate of perceived exertion (RPE) scale;Knowledge and understanding of Target Heart Rate Range (THRR);Able to check pulse independently;Understanding of Exercise Prescription       Comments Pt has completed 6 sessions of cardiac rehab. She seems to be motivated to improve her health and has progressed her workload during class. She is currently working at 2.09 METs in the stepper. Will continue to monitor and progress as able.       Expected Outcomes Through exercise at home and at rehab, the patient will meet their stated goals.                 Discharge Exercise Prescription (Final Exercise Prescription Changes):  Exercise Prescription Changes - 10/04/21 1534       Response to Exercise   Blood Pressure (Admit) 102/60    Blood Pressure (Exercise) 130/62    Blood Pressure (Exit) 96/58    Heart Rate (Admit) 78 bpm    Heart Rate (Exercise) 100 bpm    Heart Rate (Exit) 87 bpm    Rating of Perceived Exertion (Exercise) 12    Duration Continue with 30 min of aerobic exercise without signs/symptoms of physical distress.    Intensity THRR unchanged      Progression   Progression Continue to progress workloads to maintain intensity without signs/symptoms of physical distress.      Resistance Training   Training Prescription Yes    Weight 3    Reps 10-15    Time 10 Minutes      NuStep   Level 2    SPM 103    Minutes 39     METs 2.01             Nutrition:  Target Goals: Understanding of nutrition guidelines, daily intake of sodium 1500mg , cholesterol 200mg , calories 30% from fat and 7% or less from saturated fats, daily to have 5 or more servings  of fruits and vegetables.  Biometrics:  Pre Biometrics - 09/16/21 1440       Pre Biometrics   Height 5' 1.5" (1.562 m)    Weight 80.8 kg    Waist Circumference 42.5 inches    Hip Circumference 45 inches    Waist to Hip Ratio 0.94 %    BMI (Calculated) 33.12    Triceps Skinfold 19 mm    % Body Fat 42.6 %    Grip Strength 20 kg    Flexibility 12 in    Single Leg Stand 12 seconds              Nutrition Therapy Plan and Nutrition Goals:   Nutrition Assessments:  Nutrition Assessments - 09/16/21 1323       MEDFICTS Scores   Pre Score 87            MEDIFICTS Score Key: ?70 Need to make dietary changes  40-70 Heart Healthy Diet ? 40 Therapeutic Level Cholesterol Diet   Picture Your Plate Scores: <18 Unhealthy dietary pattern with much room for improvement. 41-50 Dietary pattern unlikely to meet recommendations for good health and room for improvement. 51-60 More healthful dietary pattern, with some room for improvement.  >60 Healthy dietary pattern, although there may be some specific behaviors that could be improved.    Nutrition Goals Re-Evaluation:   Nutrition Goals Discharge (Final Nutrition Goals Re-Evaluation):   Psychosocial: Target Goals: Acknowledge presence or absence of significant depression and/or stress, maximize coping skills, provide positive support system. Participant is able to verbalize types and ability to use techniques and skills needed for reducing stress and depression.  Initial Review & Psychosocial Screening:  Initial Psych Review & Screening - 09/27/21 0827       Initial Review   Source of Stress Concerns --    Comments --             Quality of Life Scores:  Quality of Life -  09/16/21 1446       Quality of Life   Select Quality of Life      Quality of Life Scores   Health/Function Pre 17.45 %    Socioeconomic Pre 18.5 %    Psych/Spiritual Pre 12.5 %    Family Pre 30 %    GLOBAL Pre 19.22 %            Scores of 19 and below usually indicate a poorer quality of life in these areas.  A difference of  2-3 points is a clinically meaningful difference.  A difference of 2-3 points in the total score of the Quality of Life Index has been associated with significant improvement in overall quality of life, self-image, physical symptoms, and general health in studies assessing change in quality of life.  PHQ-9: Recent Review Flowsheet Data     Depression screen [facility] 2/9 09/16/2021   Decreased Interest 3   Down, Depressed, Hopeless 3   PHQ - 2 Score 6   Altered sleeping 2   Tired, decreased energy 3   Change in appetite 3    Feeling bad or failure about yourself  3   Trouble concentrating 2   Moving slowly or fidgety/restless 0   Suicidal thoughts 0   PHQ-9 Score 19   Difficult doing work/chores Very difficult      Interpretation of Total Score  Total Score Depression Severity:  1-4 = Minimal depression, 5-9 = Mild depression, 10-14 = Moderate depression, 15-19 = Moderately severe depression, 20-27 =  Severe depression   Psychosocial Evaluation and Intervention:  Psychosocial Evaluation - 09/16/21 1437       Psychosocial Evaluation & Interventions   Interventions Stress management education;Relaxation education;Encouraged to exercise with the program and follow exercise prescription    Comments Pt's transportation could be a barrier to participation in rehab. She reports that her car is unreliable but it should be okay to come to rehab. She has several identfiable psychosocial issues. She has anxiety and depression which is treated with xanax and buspar. She reports that her PCP recently cut her xanax prescription in half and that she has felt more  depressed since this. She was seeing a psychiatrist in [place] but she reports that she had a panic attack during an appointment and that the psychiatrist will no longer see her. She has been referred to another psychiatrist in [place] but states that this is too far for her to drive. She also states that [facility] would be too far to drive as well. She scored a 19 on her PHQ-9. She reports little interest in doing things and feeling depressed almost everyday. Her only support system is one of her friend. She has several sources of stress. Her primary source is her health. Along with her heart issues, she also has poorly controlled DM and she has recently been referred to pulmonlogy due to a suspected pulmonary conditon. Her living situation causes her stress. Her son and brother both live with her. She reports that her son is a recovering addict and currently does not have a job to help pay bills. She reports that her brother has early stage dementia and also has terrible personal hygiene. She is hopeful that once her son gets a job that he can move out. She is currently smoking about a pack of cigarettes per day. She has cut back from three packs per day before her NSTEMI. She has a plan to stop. She just bought nicotine patches and will call 1800quitnow if she needs assistance. She is going to pick a quit date soon. She reports that both her brother and son smoke cigarettes in her house which could be trigger for her to relapse. She reports that she will tell them that it is her house and if they want to live there they will have to smoke outside. She in confident in her plan. She is very nervous about starting the program but she does have a positive outlook that it will help her.    Expected Outcomes Pt's anxiety and depression will be better contolled through medication and patient will see a psychiatrist. Her stressors will be reduced or eliminated. She will follow through with her smoking cessation  plan.    Continue Psychosocial Services  Follow up required by staff             Psychosocial Re-Evaluation:  Psychosocial Re-Evaluation     [place] Name 09/27/21 0827             Psychosocial Re-Evaluation   Current issues with Current Depression;Current Anxiety/Panic;Current Stress Concerns       Comments Patient is new to the program completing 1 sessions. She continues on Alprazolam and Buspar to manage her depression and anxiety. We will continue to monitor her progress and management of psychosocial issures.       Expected Outcomes Patient's psychosocial issues will continue to be managed with no barriers identiified.       Interventions Stress management education;Relaxation education;Encouraged to attend Cardiac Rehabilitation for the exercise  Psychosocial Discharge (Final Psychosocial Re-Evaluation):  Psychosocial Re-Evaluation - 09/27/21 0827       Psychosocial Re-Evaluation   Current issues with Current Depression;Current Anxiety/Panic;Current Stress Concerns    Comments Patient is new to the program completing 1 sessions. She continues on Alprazolam and Buspar to manage her depression and anxiety. We will continue to monitor her progress and management of psychosocial issures.    Expected Outcomes Patient's psychosocial issues will continue to be managed with no barriers identiified.    Interventions Stress management education;Relaxation education;Encouraged to attend Cardiac Rehabilitation for the exercise             Vocational Rehabilitation: Provide vocational rehab assistance to qualifying candidates.   Vocational Rehab Evaluation & Intervention:  Vocational Rehab - 09/16/21 1335       Initial Vocational Rehab Evaluation & Intervention   Assessment shows need for Vocational Rehabilitation No             Education: Education Goals: Education classes will be provided on a weekly basis, covering required topics. Participant will  state understanding/return demonstration of topics presented.  Learning Barriers/Preferences:  Learning Barriers/Preferences - 09/16/21 1329       Learning Barriers/Preferences   Learning Barriers Sight    Learning Preferences Audio             Education Topics: Hypertension, Hypertension Reduction -Define heart disease and high blood pressure. Discus how high blood pressure affects the body and ways to reduce high blood pressure.   Exercise and Your Heart -Discuss why it is important to exercise, the FITT principles of exercise, normal and abnormal responses to exercise, and how to exercise safely.   Angina -Discuss definition of angina, causes of angina, treatment of angina, and how to decrease risk of having angina.   Cardiac Medications -Review what the following cardiac medications are used for, how they affect the body, and side effects that may occur when taking the medications.  Medications include Aspirin, Beta blockers, calcium channel blockers, ACE Inhibitors, angiotensin receptor blockers, diuretics, digoxin, and antihyperlipidemics.   Congestive Heart Failure -Discuss the definition of CHF, how to live with CHF, the signs and symptoms of CHF, and how keep track of weight and sodium intake.   Heart Disease and Intimacy -Discus the effect sexual activity has on the heart, how changes occur during intimacy as we age, and safety during sexual activity.   Smoking Cessation / COPD -Discuss different methods to quit smoking, the health benefits of quitting smoking, and the definition of COPD.   Nutrition I: Fats -Discuss the types of cholesterol, what cholesterol does to the heart, and how cholesterol levels can be controlled.   Nutrition II: Labels -Discuss the different components of food labels and how to read food label   Heart Parts/Heart Disease and PAD -Discuss the anatomy of the heart, the pathway of blood circulation through the heart, and these  are affected by heart disease.   Stress I: Signs and Symptoms -Discuss the causes of stress, how stress may lead to anxiety and depression, and ways to limit stress.   Stress II: Relaxation -Discuss different types of relaxation techniques to limit stress.   Warning Signs of Stroke / TIA -Discuss definition of a stroke, what the signs and symptoms are of a stroke, and how to identify when someone is having stroke.   Knowledge Questionnaire Score:  Knowledge Questionnaire Score - 09/16/21 1329       Knowledge Questionnaire Score   Pre Score 19/28  Core Components/Risk Factors/Patient Goals at Admission:  Personal Goals and Risk Factors at Admission - 09/16/21 1335       Core Components/Risk Factors/Patient Goals on Admission    Weight Management Yes;Weight Loss;Obesity    Intervention Weight Management/Obesity: Establish reasonable short term and long term weight goals.;Obesity: Provide education and appropriate resources to help participant work on and attain dietary goals.;Weight Management: Develop a combined nutrition and exercise program designed to reach desired caloric intake, while maintaining appropriate intake of nutrient and fiber, sodium and fats, and appropriate energy expenditure required for the weight goal.;Weight Management: Provide education and appropriate resources to help participant work on and attain dietary goals.    Expected Outcomes Short Term: Continue to assess and modify interventions until short term weight is achieved;Long Term: Adherence to nutrition and physical activity/exercise program aimed toward attainment of established weight goal;Weight Loss: Understanding of general recommendations for a balanced deficit meal plan, which promotes 1-2 lb weight loss per week and includes a negative energy balance of [phone] kcal/d;Understanding recommendations for meals to include 15-35% energy as protein, 25-35% energy from fat, 35-60% energy  from carbohydrates, less than 200mg  of dietary cholesterol, 20-35 gm of total fiber daily;Understanding of distribution of calorie intake throughout the day with the consumption of 4-5 meals/snacks;Weight Maintenance: Understanding of the daily nutrition guidelines, which includes 25-35% calories from fat, 7% or less cal from saturated fats, less than 200mg  cholesterol, less than 1.5gm of sodium, & 5 or more servings of fruits and vegetables daily    Tobacco Cessation Yes    Number of packs per day 1    Intervention Assist the participant in steps to quit. Provide individualized education and counseling about committing to Tobacco Cessation, relapse prevention, and pharmacological support that can be provided by physician.;Advice worker, assist with locating and accessing local/national Quit Smoking programs, and support quit date choice.    Expected Outcomes Short Term: Will demonstrate readiness to quit, by selecting a quit date.;Short Term: Will quit all tobacco product use, adhering to prevention of relapse plan.;Long Term: Complete abstinence from all tobacco products for at least 12 months from quit date.    Improve shortness of breath with ADL's Yes    Intervention Provide education, individualized exercise plan and daily activity instruction to help decrease symptoms of SOB with activities of daily living.    Expected Outcomes Short Term: Improve cardiorespiratory fitness to achieve a reduction of symptoms when performing ADLs;Long Term: Be able to perform more ADLs without symptoms or delay the onset of symptoms    Diabetes Yes    Intervention Provide education about signs/symptoms and action to take for hypo/hyperglycemia.;Provide education about proper nutrition, including hydration, and aerobic/resistive exercise prescription along with prescribed medications to achieve blood glucose in normal ranges: Fasting glucose 65-99 mg/dL    Expected Outcomes Short Term: Participant  verbalizes understanding of the signs/symptoms and immediate care of hyper/hypoglycemia, proper foot care and importance of medication, aerobic/resistive exercise and nutrition plan for blood glucose control.;Long Term: Attainment of HbA1C < 7%.    Hypertension Yes    Intervention Provide education on lifestyle modifcations including regular physical activity/exercise, weight management, moderate sodium restriction and increased consumption of fresh fruit, vegetables, and low fat dairy, alcohol moderation, and smoking cessation.;Monitor prescription use compliance.    Expected Outcomes Short Term: Continued assessment and intervention until BP is < 140/48mm HG in hypertensive participants. < 130/18mm HG in hypertensive participants with diabetes, heart failure or chronic kidney disease.;Long Term: Maintenance of blood  pressure at goal levels.    Lipids Yes    Intervention Provide education and support for participant on nutrition & aerobic/resistive exercise along with prescribed medications to achieve LDL 70mg , HDL >40mg .    Expected Outcomes Short Term: Participant states understanding of desired cholesterol values and is compliant with medications prescribed. Participant is following exercise prescription and nutrition guidelines.;Long Term: Cholesterol controlled with medications as prescribed, with individualized exercise RX and with personalized nutrition plan. Value goals: LDL < 70mg , HDL > 40 mg.    Stress Yes    Intervention Offer individual and/or small group education and counseling on adjustment to heart disease, stress management and health-related lifestyle change. Teach and support self-help strategies.    Expected Outcomes Short Term: Participant demonstrates changes in health-related behavior, relaxation and other stress management skills, ability to obtain effective social support, and compliance with psychotropic medications if prescribed.;Long Term: Emotional wellbeing is indicated by  absence of clinically significant psychosocial distress or social isolation.             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 Harl Bowie. She has multiple risk factors for CAD and is participating in the program for risk modification. She has completed 1 session. Her personal goals for the program are to quit smoking; lose weight; gain strength; and be able to cope with stress better. We will continue to monitor her progress as she works towards meeting these goals.       Expected Outcomes Patient will complete the program meeting both personal and program goals.                Core Components/Risk Factors/Patient Goals at Discharge (Final Review):   Goals and Risk Factor Review - 09/27/21 0832       Core Components/Risk Factors/Patient Goals Review   Personal Goals Review Weight Management/Obesity;Diabetes;Tobacco Cessation    Review Patient was referred to CR with NSTEMI and S/P PTCA by Dr. Harl Bowie. She has multiple risk factors for CAD and is participating in the program for risk modification. She has completed 1 session. Her personal goals for the program are to quit smoking; lose weight; gain strength; and be able to cope with stress better. We will continue to monitor her progress as she works towards meeting these goals.    Expected Outcomes Patient will complete the program meeting both personal and program goals.             ITP Comments:   Comments: ITP REVIEW Pt is making expected progress toward Cardiac Rehab goals after completing 6 sessions. Recommend continued exercise, life style modification, education, and increased stamina and strength.

## 2021-10-08 ENCOUNTER — Encounter (HOSPITAL_COMMUNITY)
Admission: RE | Admit: 2021-10-08 | Discharge: 2021-10-08 | Disposition: A | Discharge status: Home / Self Care | Payer: Medicare Other | Source: Ambulatory Visit | Attending: Cardiology | Admitting: Cardiology

## 2021-10-08 DIAGNOSIS — Z9861 Coronary angioplasty status: Secondary | ICD-10-CM | POA: Diagnosis not present

## 2021-10-08 DIAGNOSIS — I214 Non-ST elevation (NSTEMI) myocardial infarction: Secondary | ICD-10-CM

## 2021-10-08 NOTE — Progress Notes (Signed)
Daily Session Note  Patient Details  Name: Katelyn Kelly MRN: [id number] Date of Birth: [birth year] Referring Provider:   Flowsheet Row CARDIAC REHAB PHASE II ORIENTATION from 09/16/2021 in [place]  Referring Provider Dr. Harl Bowie       Encounter Date: 10/08/2021  Check In:  Session Check In - 10/08/21 1445       Check-In   Supervising physician immediately available to respond to emergencies [facility] MD immediately available    Physician(s) Dr. Harl Bowie    Location [facility]    Staff Present Geanie Cooley, RN;Dalton Fletcher, MS, ACSM-CEP, Exercise Physiologist    Virtual Visit No    Medication changes reported     No    Fall or balance concerns reported    No    Tobacco Cessation No Change    Current number of cigarettes/nicotine per day     15    Warm-up and Cool-down Performed as group-led instruction    Resistance Training Performed Yes    VAD Patient? No    PAD/SET Patient? No      Pain Assessment   Currently in Pain? No/denies    Pain Score 0-No pain    Multiple Pain Sites No             Capillary Blood Glucose: No results found for this or any previous visit (from the past 24 hour(s)).    Social History   Tobacco Use  Smoking Status Every Day   Packs/day: 1.00   Years: 40.00   Pack years: 40.00   Types: Cigarettes  Smokeless Tobacco Never  Tobacco Comments   one pack a day. Pt has purchased nicotine patches. She has a plan to stop and is going to set a quit date.     Goals Met:  Independence with exercise equipment Exercise tolerated well No report of concerns or symptoms today Strength training completed today  Goals Unmet:  Not Applicable  Comments: check out @ 3:45pm   Dr. Kathie Dike is Medical Director for [facility].

## 2021-10-11 ENCOUNTER — Encounter (HOSPITAL_COMMUNITY): Payer: Medicare Other

## 2021-10-13 ENCOUNTER — Encounter (HOSPITAL_COMMUNITY)
Admission: RE | Admit: 2021-10-13 | Discharge: 2021-10-13 | Disposition: A | Discharge status: Home / Self Care | Payer: Medicare Other | Source: Ambulatory Visit | Attending: Cardiology | Admitting: Cardiology

## 2021-10-13 DIAGNOSIS — I214 Non-ST elevation (NSTEMI) myocardial infarction: Secondary | ICD-10-CM

## 2021-10-13 DIAGNOSIS — Z9861 Coronary angioplasty status: Secondary | ICD-10-CM

## 2021-10-13 NOTE — Progress Notes (Signed)
Daily Session Note  Patient Details  Name: Katelyn Kelly MRN: [id number] Date of Birth: 1961-10-31 Referring Provider:   Flowsheet Row CARDIAC REHAB PHASE II ORIENTATION from 09/16/2021 in [place]  Referring Provider Dr. Harl Bowie       Encounter Date: 10/13/2021  Check In:  Session Check In - 10/13/21 1439       Check-In   Supervising physician immediately available to respond to emergencies [facility] MD immediately available    Physician(s) Dr. Johney Frame    Location [facility]    Staff Present Geanie Cooley, RN;Heather Otho Ket, BS, Exercise Physiologist    Virtual Visit No    Medication changes reported     No    Fall or balance concerns reported    No    Tobacco Cessation Use Decreased    Current number of cigarettes/nicotine per day     15    Warm-up and Cool-down Performed as group-led instruction    Resistance Training Performed Yes    VAD Patient? No    PAD/SET Patient? No      Pain Assessment   Currently in Pain? No/denies    Pain Score 0-No pain    Multiple Pain Sites No             Capillary Blood Glucose: No results found for this or any previous visit (from the past 24 hour(s)).    Social History   Tobacco Use  Smoking Status Every Day   Packs/day: 1.00   Years: 40.00   Pack years: 40.00   Types: Cigarettes  Smokeless Tobacco Never  Tobacco Comments   one pack a day. Pt has purchased nicotine patches. She has a plan to stop and is going to set a quit date.     Goals Met:  Independence with exercise equipment Exercise tolerated well No report of concerns or symptoms today Strength training completed today  Goals Unmet:  Not Applicable  Comments: check out @ 3:45pm   Dr. Kathie Dike is Medical Director for [facility] Pulmonary Rehab.

## 2021-10-15 ENCOUNTER — Encounter (HOSPITAL_COMMUNITY)
Admission: RE | Admit: 2021-10-15 | Discharge: 2021-10-15 | Disposition: A | Discharge status: Home / Self Care | Payer: Medicare Other | Source: Ambulatory Visit | Attending: Cardiology | Admitting: Cardiology

## 2021-10-15 ENCOUNTER — Other Ambulatory Visit: Payer: Self-pay

## 2021-10-15 DIAGNOSIS — Z9861 Coronary angioplasty status: Secondary | ICD-10-CM

## 2021-10-15 DIAGNOSIS — I214 Non-ST elevation (NSTEMI) myocardial infarction: Secondary | ICD-10-CM

## 2021-10-15 NOTE — Progress Notes (Signed)
Daily Session Note  Patient Details  Name: Katelyn Kelly MRN: [id number] Date of Birth: [birth year] Referring Provider:   Flowsheet Row CARDIAC REHAB PHASE II ORIENTATION from 09/16/2021 in [place]  Referring Provider Dr. Harl Bowie       Encounter Date: 10/15/2021  Check In:  Session Check In - 10/15/21 1437       Check-In   Supervising physician immediately available to respond to emergencies [facility] MD immediately available    Physician(s) Dr. Domenic Polite    Staff Present Geanie Cooley, RN;Heather Otho Ket, BS, Exercise Physiologist;Debra Wynetta Emery, RN, BSN    Virtual Visit No    Medication changes reported     No    Fall or balance concerns reported    No    Tobacco Cessation Use Decreased    Current number of cigarettes/nicotine per day     15    Warm-up and Cool-down Performed as group-led instruction    Resistance Training Performed Yes    VAD Patient? No    PAD/SET Patient? No      Pain Assessment   Currently in Pain? No/denies    Pain Score 0-No pain    Multiple Pain Sites No             Capillary Blood Glucose: No results found for this or any previous visit (from the past 24 hour(s)).    Social History   Tobacco Use  Smoking Status Every Day   Packs/day: 1.00   Years: 40.00   Pack years: 40.00   Types: Cigarettes  Smokeless Tobacco Never  Tobacco Comments   one pack a day. Pt has purchased nicotine patches. She has a plan to stop and is going to set a quit date.     Goals Met:  Independence with exercise equipment Exercise tolerated well No report of concerns or symptoms today Strength training completed today  Goals Unmet:  Not Applicable  Comments: check out @ 3:45pm   Dr. Kathie Dike is Medical Director for [facility] Pulmonary Rehab.

## 2021-10-18 ENCOUNTER — Encounter (HOSPITAL_COMMUNITY)
Admission: RE | Admit: 2021-10-18 | Discharge: 2021-10-18 | Disposition: A | Discharge status: Home / Self Care | Payer: Medicare Other | Source: Ambulatory Visit | Attending: Cardiology | Admitting: Cardiology

## 2021-10-18 VITALS — Wt 174.4 lb

## 2021-10-18 DIAGNOSIS — I214 Non-ST elevation (NSTEMI) myocardial infarction: Secondary | ICD-10-CM

## 2021-10-18 DIAGNOSIS — Z9861 Coronary angioplasty status: Secondary | ICD-10-CM | POA: Diagnosis not present

## 2021-10-18 NOTE — Progress Notes (Signed)
Daily Session Note  Patient Details  Name: Katelyn Kelly MRN: [id number] Date of Birth: Jan 16, 1961 Referring Provider:   Flowsheet Row CARDIAC REHAB PHASE II ORIENTATION from 09/16/2021 in [place]  Referring Provider Dr. Harl Bowie       Encounter Date: 10/18/2021  Check In:  Session Check In - 10/18/21 1444       Check-In   Supervising physician immediately available to respond to emergencies [facility] MD immediately available    Physician(s) Dr. Johnsie Cancel    Location [facility]    Staff Present Hoy Register, MS, ACSM-CEP, Exercise Physiologist;Vona Whiters Zigmund Daniel, Exercise Physiologist    Virtual Visit No    Medication changes reported     No    Fall or balance concerns reported    No    Tobacco Cessation No Change    Warm-up and Cool-down Performed as group-led instruction    Resistance Training Performed Yes    VAD Patient? No    PAD/SET Patient? No      Pain Assessment   Currently in Pain? No/denies    Pain Score 0-No pain    Multiple Pain Sites No             Capillary Blood Glucose: No results found for this or any previous visit (from the past 24 hour(s)).    Social History   Tobacco Use  Smoking Status Every Day   Packs/day: 1.00   Years: 40.00   Pack years: 40.00   Types: Cigarettes  Smokeless Tobacco Never  Tobacco Comments   one pack a day. Pt has purchased nicotine patches. She has a plan to stop and is going to set a quit date.     Goals Met:  Independence with exercise equipment Exercise tolerated well No report of concerns or symptoms today Strength training completed today  Goals Unmet:  Not Applicable  Comments: check out 1545   Dr. Kathie Dike is Medical Director for [facility] Pulmonary Rehab.

## 2021-10-20 ENCOUNTER — Encounter (HOSPITAL_COMMUNITY)
Admission: RE | Admit: 2021-10-20 | Discharge: 2021-10-20 | Disposition: A | Discharge status: Home / Self Care | Payer: Medicare Other | Source: Ambulatory Visit | Attending: Cardiology | Admitting: Cardiology

## 2021-10-20 DIAGNOSIS — Z9861 Coronary angioplasty status: Secondary | ICD-10-CM | POA: Diagnosis not present

## 2021-10-20 DIAGNOSIS — I214 Non-ST elevation (NSTEMI) myocardial infarction: Secondary | ICD-10-CM

## 2021-10-20 NOTE — Progress Notes (Signed)
Daily Session Note  Patient Details  Name: Katelyn Kelly MRN: [id number] Date of Birth: June 16, 1961 Referring Provider:   Flowsheet Row CARDIAC REHAB PHASE II ORIENTATION from 09/16/2021 in [place]  Referring Provider Dr. Harl Bowie       Encounter Date: 10/20/2021  Check In:  Session Check In - 10/20/21 1445       Check-In   Supervising physician immediately available to respond to emergencies [facility] MD immediately available    Physician(s) Dr. Harrington Challenger    Location [facility]    Staff Present Redge Gainer, BS, Exercise Physiologist;Laira Penninger Wynetta Emery, RN, BSN    Fall or balance concerns reported    No    Tobacco Cessation No Change    Warm-up and Cool-down Performed as group-led instruction    Resistance Training Performed Yes    VAD Patient? No    PAD/SET Patient? No      Pain Assessment   Currently in Pain? No/denies    Pain Score 0-No pain    Multiple Pain Sites No             Capillary Blood Glucose: No results found for this or any previous visit (from the past 24 hour(s)).    Social History   Tobacco Use  Smoking Status Every Day   Packs/day: 1.00   Years: 40.00   Pack years: 40.00   Types: Cigarettes  Smokeless Tobacco Never  Tobacco Comments   one pack a day. Pt has purchased nicotine patches. She has a plan to stop and is going to set a quit date.     Goals Met:  Independence with exercise equipment Exercise tolerated well No report of concerns or symptoms today Strength training completed today  Goals Unmet:  Not Applicable  Comments: Check out 1545.   Dr. Kathie Dike is Medical Director for [facility] Pulmonary Re[facility].

## 2021-10-22 ENCOUNTER — Encounter (HOSPITAL_COMMUNITY): Payer: Medicare Other

## 2021-10-25 ENCOUNTER — Encounter (HOSPITAL_COMMUNITY): Payer: Medicare Other

## 2021-10-27 ENCOUNTER — Encounter (HOSPITAL_COMMUNITY)
Admission: RE | Admit: 2021-10-27 | Discharge: 2021-10-27 | Disposition: A | Discharge status: Home / Self Care | Payer: Medicare Other | Source: Ambulatory Visit | Attending: Cardiology | Admitting: Cardiology

## 2021-10-27 DIAGNOSIS — Z9861 Coronary angioplasty status: Secondary | ICD-10-CM

## 2021-10-27 DIAGNOSIS — I214 Non-ST elevation (NSTEMI) myocardial infarction: Secondary | ICD-10-CM

## 2021-10-27 NOTE — Progress Notes (Signed)
Daily Session Note  Patient Details  Name: Katelyn Kelly MRN: [id number] Date of Birth: 1961-09-02 Referring Provider:   Flowsheet Row CARDIAC REHAB PHASE II ORIENTATION from 09/16/2021 in [place]  Referring Provider Dr. Harl Bowie       Encounter Date: 10/27/2021  Check In:  Session Check In - 10/27/21 1442       Check-In   Supervising physician immediately available to respond to emergencies [facility] MD immediately available    Physician(s) Dr. Johnsie Cancel    Location [facility]    Staff Present Hoy Register, MS, ACSM-CEP, Exercise Physiologist;Jenetta Wease Zigmund Daniel, Exercise Physiologist    Virtual Visit No    Medication changes reported     No    Fall or balance concerns reported    No    Tobacco Cessation No Change    Warm-up and Cool-down Performed as group-led instruction    Resistance Training Performed Yes    VAD Patient? No    PAD/SET Patient? No      Pain Assessment   Currently in Pain? No/denies    Pain Score 0-No pain    Multiple Pain Sites No             Capillary Blood Glucose: No results found for this or any previous visit (from the past 24 hour(s)).    Social History   Tobacco Use  Smoking Status Every Day   Packs/day: 1.00   Years: 40.00   Pack years: 40.00   Types: Cigarettes  Smokeless Tobacco Never  Tobacco Comments   one pack a day. Pt has purchased nicotine patches. She has a plan to stop and is going to set a quit date.     Goals Met:  Improved SOB with ADL's Exercise tolerated well No report of concerns or symptoms today Strength training completed today  Goals Unmet:  Not Applicable  Comments: check out 1545   Dr. Kathie Dike is Medical Director for [facility] Pulmonary Rehab.

## 2021-10-27 NOTE — Progress Notes (Signed)
I have reviewed a Home Exercise Prescription with Katelyn Kelly . [name] is not currently exercising at home.  The patient was advised to walk 5 days a week for 30-45 minutes.  [name] and I discussed how to progress their exercise prescription.  The patient stated that their goals were lose weight and maintain health.  The patient stated that they understand the exercise prescription.  We reviewed exercise guidelines, target heart rate during exercise, RPE Scale, weather conditions, NTG use, endpoints for exercise, warmup and cool down.  Patient is encouraged to come to me with any questions. I will continue to follow up with the patient to assist them with progression and safety.

## 2021-10-28 ENCOUNTER — Institutional Professional Consult (permissible substitution): Payer: Medicare Other | Admitting: Internal Medicine

## 2021-10-29 ENCOUNTER — Other Ambulatory Visit: Payer: Self-pay

## 2021-10-29 ENCOUNTER — Encounter (HOSPITAL_COMMUNITY)
Admission: RE | Admit: 2021-10-29 | Discharge: 2021-10-29 | Disposition: A | Discharge status: Home / Self Care | Payer: Medicare Other | Source: Ambulatory Visit | Attending: Cardiology | Admitting: Cardiology

## 2021-10-29 DIAGNOSIS — I214 Non-ST elevation (NSTEMI) myocardial infarction: Secondary | ICD-10-CM

## 2021-10-29 DIAGNOSIS — Z9861 Coronary angioplasty status: Secondary | ICD-10-CM

## 2021-10-29 NOTE — Progress Notes (Signed)
Daily Session Note  Patient Details  Name: Katelyn Kelly MRN: [id number] Date of Birth: June 17, 1961 Referring Provider:   Flowsheet Row CARDIAC REHAB PHASE II ORIENTATION from 09/16/2021 in [place]  Referring Provider Dr. Harl Bowie       Encounter Date: 10/29/2021  Check In:  Session Check In - 10/29/21 1436       Check-In   Supervising physician immediately available to respond to emergencies [facility] MD immediately available    Physician(s) Dr. Johnsie Cancel    Location [facility]    Staff Present Hoy Register, MS, ACSM-CEP, Exercise Physiologist;Heather Zigmund Daniel, Exercise Physiologist;Other    Virtual Visit No    Medication changes reported     No    Fall or balance concerns reported    No    Tobacco Cessation No Change    Current number of cigarettes/nicotine per day     15    Warm-up and Cool-down Performed as group-led instruction    Resistance Training Performed Yes    VAD Patient? No    PAD/SET Patient? No      Pain Assessment   Currently in Pain? No/denies    Pain Score 0-No pain    Multiple Pain Sites No             Capillary Blood Glucose: No results found for this or any previous visit (from the past 24 hour(s)).    Social History   Tobacco Use  Smoking Status Every Day   Packs/day: 1.00   Years: 40.00   Pack years: 40.00   Types: Cigarettes  Smokeless Tobacco Never  Tobacco Comments   one pack a day. Pt has purchased nicotine patches. She has a plan to stop and is going to set a quit date.     Goals Met:  Independence with exercise equipment Exercise tolerated well No report of concerns or symptoms today Strength training completed today  Goals Unmet:  Not Applicable  Comments: checkout time is 1545   Dr. Kathie Dike is Medical Director for [facility] Pulmonary Rehab.

## 2021-11-01 ENCOUNTER — Encounter (HOSPITAL_COMMUNITY): Payer: Medicare Other

## 2021-11-03 ENCOUNTER — Encounter (HOSPITAL_COMMUNITY): Payer: Medicare Other

## 2021-11-03 ENCOUNTER — Other Ambulatory Visit: Payer: Self-pay | Admitting: Neurosurgery

## 2021-11-03 DIAGNOSIS — M4722 Other spondylosis with radiculopathy, cervical region: Secondary | ICD-10-CM

## 2021-11-03 NOTE — Progress Notes (Signed)
Cardiac Individual Treatment Plan  Patient Details  Name: Katelyn Kelly MRN: [id number] Date of Birth: 05-11-1961 Referring Provider:   Flowsheet Row CARDIAC REHAB PHASE II ORIENTATION from 09/16/2021 in [facility]  Referring Provider Dr. Harl Bowie       Initial Encounter Date:  Flowsheet Row CARDIAC REHAB PHASE II ORIENTATION from 09/16/2021 in [facility]  Date 09/16/21       Visit Diagnosis: S/P PTCA (percutaneous transluminal coronary angioplasty)  NSTEMI (non-ST elevated myocardial infarction) ([place])  Patient's Home Medications on Admission:  Current Outpatient Medications:    acetaminophen (TYLENOL) 650 MG CR tablet, Take 1,300 mg by mouth every 8 (eight) hours as needed for pain., Disp: , Rfl:    ALPRAZolam (XANAX) 0.5 MG tablet, Take 1 tablet (0.5 mg total) by mouth in the morning, at noon, in the evening, and at bedtime. (Patient taking differently: Take 0.5 mg by mouth 2 (two) times daily.), Disp: , Rfl:    aspirin 81 MG EC tablet, Take 1 tablet (81 mg total) by mouth daily. Swallow whole. (Patient taking differently: Take 81 mg by mouth at bedtime. Swallow whole.), Disp: 90 tablet, Rfl: 2   atorvastatin (LIPITOR) 80 MG tablet, Take 1 tablet (80 mg total) by mouth at bedtime., Disp: 90 tablet, Rfl: 1   busPIRone (BUSPAR) 7.5 MG tablet, Take by mouth See admin instructions. Take 0.5 tablets (3.75 mg total) by mouth 3 times daily for 7 days, THEN 1 tablet (7.5 mg total) 3 times daily for 7 days, THEN 2 tablets (15 mg total) 3 times daily, Disp: , Rfl:    calcium carbonate (TUMS - DOSED IN MG ELEMENTAL CALCIUM) 500 MG chewable tablet, Chew 1,000 mg by mouth daily as needed for indigestion or heartburn., Disp: , Rfl:    cephALEXin (KEFLEX) 500 MG capsule, Take 1 capsule (500 mg total) by mouth 2 (two) times daily. (Patient not taking: No sig reported), Disp: 10 capsule, Rfl: 0   cyclobenzaprine (FLEXERIL) 5 MG tablet, Take 5 mg by  mouth 3 (three) times daily as needed for muscle spasms., Disp: , Rfl:    empagliflozin (JARDIANCE) 10 MG TABS tablet, Take 1 tablet (10 mg total) by mouth daily before breakfast., Disp: 30 tablet, Rfl: 11   fluconazole (DIFLUCAN) 150 MG tablet, Take one tablet by mouth as a single dose. May repeat in 3 days if symptoms persist. (Patient not taking: No sig reported), Disp: 2 tablet, Rfl: 0   insulin lispro (HUMALOG) 100 UNIT/ML KwikPen, Inject 2-4 Units into the skin See admin instructions. Inject 2 to 4 units 3 times daily. May inject a 4th dose as needed for high blood sugar, Disp: , Rfl:    isosorbide mononitrate (IMDUR) 30 MG 24 hr tablet, Take 1 tablet (30 mg total) by mouth 2 (two) times daily., Disp: 180 tablet, Rfl: 3   Menthol, Topical Analgesic, (BIOFREEZE) 10 % LIQD, Apply 1 application topically daily as needed (pain)., Disp: , Rfl:    metoprolol tartrate (LOPRESSOR) 25 MG tablet, Take 0.5 tablets (12.5 mg total) by mouth 2 (two) times daily., Disp: 90 tablet, Rfl: 3   nicotine (NICODERM CQ - DOSED IN MG/24 HOURS) 21 mg/24hr patch, Place 1 patch (21 mg total) onto the skin daily., Disp: 28 patch, Rfl: 0   nitroGLYCERIN (NITROSTAT) 0.4 MG SL tablet, Place 1 tablet (0.4 mg total) under the tongue every 5 (five) minutes x 3 doses as needed for chest pain., Disp: 25 tablet, Rfl: 2   ondansetron (  ZOFRAN-ODT) 4 MG disintegrating tablet, Take 1 tablet (4 mg total) by mouth every 8 (eight) hours as needed for nausea or vomiting., Disp: 30 tablet, Rfl: 2   pramipexole (MIRAPEX) 0.25 MG tablet, Take 0.25 mg by mouth at bedtime., Disp: , Rfl:    ticagrelor (BRILINTA) 90 MG TABS tablet, Take 1 tablet (90 mg total) by mouth 2 (two) times daily., Disp: 180 tablet, Rfl: 2   topiramate (TOPAMAX) 50 MG tablet, Take 50 mg by mouth 2 (two) times daily., Disp: , Rfl:    TRESIBA FLEXTOUCH 100 UNIT/ML SOPN FlexTouch Pen, Inject 40 Units into the skin at bedtime., Disp: , Rfl:   Past Medical History: Past  Medical History:  Diagnosis Date   Anxiety and depression    Bilateral carpal tunnel syndrome    Chronic back pain    Chronic neck pain    Depression    Diabetes mellitus    GERD (gastroesophageal reflux disease)    H/O syncope    Headache    History of panic attacks    Hypertension    IBS (irritable bowel syndrome)    Manic depression (HCC)    Migraine    Panic attack     Tobacco Use: Social History   Tobacco Use  Smoking Status Every Day   Packs/day: 1.00   Years: 40.00   Pack years: 40.00   Types: Cigarettes  Smokeless Tobacco Never  Tobacco Comments   one pack a day. Pt has purchased nicotine patches. She has a plan to stop and is going to set a quit date.     Labs: Recent Review Flowsheet Data     Labs for ITP Cardiac and Pulmonary Rehab Latest Ref Rng & Units 10/21/2011 08/29/2017 10/13/2018 06/26/2021   Cholestrol 0 - 200 mg/dL - - - 193   LDLCALC 0 - 99 mg/dL - - - 105(H)   HDL >40 mg/dL - - - 56   Trlycerides <150 mg/dL - - - 158(H)   Hemoglobin A1c 4.8 - 5.6 % - 6.7(H) - 10.4(H)   TCO2 22 - 32 mmol/L 28 - 27 -       Capillary Blood Glucose: Lab Results  Component Value Date   GLUCAP 196 (H) 09/20/2021   GLUCAP 200 (H) 09/16/2021   GLUCAP 277 (H) 06/29/2021   GLUCAP 188 (H) 06/29/2021   GLUCAP 131 (H) 06/28/2021    POCT Glucose     Row Name 09/16/21 1448             POCT Blood Glucose   Pre-Exercise 200 mg/dL                Exercise Target Goals: Exercise Program Goal: Individual exercise prescription set using results from initial 6 min walk test and THRR while considering  patients activity barriers and safety.   Exercise Prescription Goal: Starting with aerobic activity 30 plus minutes a day, 3 days per week for initial exercise prescription. Provide home exercise prescription and guidelines that participant acknowledges understanding prior to discharge.  Activity Barriers & Risk Stratification:  Activity Barriers & Cardiac  Risk Stratification - 09/16/21 1317       Activity Barriers & Cardiac Risk Stratification   Activity Barriers Back Problems;Deconditioning;Shortness of Breath;Chest Pain/Angina    Cardiac Risk Stratification High             6 Minute Walk:  6 Minute Walk     Row Name 09/16/21 1419 09/16/21 1441       6  Minute Walk   Phase Initial (P)  Initial    Distance 1000 feet (P)  1000 feet    Walk Time 6 minutes (P)  6 minutes    # of Rest Breaks 0 (P)  0    MPH 1.9 (P)  1.9    METS 2.6 (P)  2.6    RPE 12 (P)  12    VO2 Peak 9.11 (P)  9.11    Symptoms No (P)  No    Resting HR -- 81 bpm    Resting BP -- 124/70    Resting Oxygen Saturation  -- 98 %    Exercise Oxygen Saturation  during 6 min walk -- 98 %    Max Ex. HR -- 110 bpm    Max Ex. BP -- 140/75    2 Minute Post BP -- 128/70             Oxygen Initial Assessment:   Oxygen Re-Evaluation:   Oxygen Discharge (Final Oxygen Re-Evaluation):   Initial Exercise Prescription:  Initial Exercise Prescription - 09/16/21 1400       Date of Initial Exercise RX and Referring Provider   Date 09/16/21    Referring Provider Dr. Harl Bowie    Expected Discharge Date 12/10/21      NuStep   Level 1    SPM 60    Minutes 39      Prescription Details   Frequency (times per week) 3    Duration Progress to 30 minutes of continuous aerobic without signs/symptoms of physical distress      Intensity   THRR 40-80% of Max Heartrate 63-126    Ratings of Perceived Exertion 11-13    Perceived Dyspnea 0-4      Resistance Training   Training Prescription Yes    Weight 3    Reps 10-15             Perform Capillary Blood Glucose checks as needed.  Exercise Prescription Changes:   Exercise Prescription Changes     Row Name 09/20/21 1544 10/04/21 1534 10/18/21 1445 10/27/21 1500 10/29/21 1445     Response to Exercise   Blood Pressure (Admit) 115/60 102/60 100/60 -- 125/60   Blood Pressure (Exercise) 102/56 130/62 135/70  -- 118/72   Blood Pressure (Exit) 108/62 96/58 110/65 -- 122/72   Heart Rate (Admit) 100 bpm 78 bpm 98 bpm -- 80 bpm   Heart Rate (Exercise) 120 bpm 100 bpm 105 bpm -- 106 bpm   Heart Rate (Exit) 109 bpm 87 bpm 104 bpm -- 89 bpm   Rating of Perceived Exertion (Exercise) 13 12 11  -- 11   Duration Continue with 30 min of aerobic exercise without signs/symptoms of physical distress. Continue with 30 min of aerobic exercise without signs/symptoms of physical distress. Continue with 30 min of aerobic exercise without signs/symptoms of physical distress. -- Continue with 30 min of aerobic exercise without signs/symptoms of physical distress.   Intensity THRR unchanged THRR unchanged THRR unchanged -- THRR unchanged     Progression   Progression Continue to progress workloads to maintain intensity without signs/symptoms of physical distress. Continue to progress workloads to maintain intensity without signs/symptoms of physical distress. -- -- Continue to progress workloads to maintain intensity without signs/symptoms of physical distress.     Resistance Training   Training Prescription Yes Yes Yes -- Yes   Weight 3 3 3  -- 3   Reps 10-15 10-15 10-15 -- 10-15   Time 103 Minutes 10  Minutes 10 Minutes -- 10 Minutes     NuStep   Level 1 2 2  -- 1   SPM 96 103 116 -- 142   Minutes 36 39 39 -- 39   METs 1.79 2.01 2.36 -- 2.16     Home Exercise Plan   Plans to continue exercise at -- -- -- Home (comment) --   Frequency -- -- -- Add 2 additional days to program exercise sessions. --   Initial Home Exercises Provided -- -- -- 10/27/21 --            Exercise Comments:   Exercise Comments     Row Name 10/27/21 1502           Exercise Comments home exercised reviewed                Exercise Goals and Review:   Exercise Goals     [place] Name 09/16/21 1445 10/05/21 1015 11/02/21 1352         Exercise Goals   Increase Physical Activity Yes Yes Yes     Intervention Provide advice,  education, support and counseling about physical activity/exercise needs.;Develop an individualized exercise prescription for aerobic and resistive training based on initial evaluation findings, risk stratification, comorbidities and participant's personal goals. Provide advice, education, support and counseling about physical activity/exercise needs.;Develop an individualized exercise prescription for aerobic and resistive training based on initial evaluation findings, risk stratification, comorbidities and participant's personal goals. Provide advice, education, support and counseling about physical activity/exercise needs.;Develop an individualized exercise prescription for aerobic and resistive training based on initial evaluation findings, risk stratification, comorbidities and participant's personal goals.     Expected Outcomes Short Term: Attend rehab on a regular basis to increase amount of physical activity.;Long Term: Add in home exercise to make exercise part of routine and to increase amount of physical activity.;Long Term: Exercising regularly at least 3-5 days a week. Short Term: Attend rehab on a regular basis to increase amount of physical activity.;Long Term: Add in home exercise to make exercise part of routine and to increase amount of physical activity.;Long Term: Exercising regularly at least 3-5 days a week. Short Term: Attend rehab on a regular basis to increase amount of physical activity.;Long Term: Add in home exercise to make exercise part of routine and to increase amount of physical activity.;Long Term: Exercising regularly at least 3-5 days a week.     Increase Strength and Stamina Yes Yes Yes     Intervention Provide advice, education, support and counseling about physical activity/exercise needs.;Develop an individualized exercise prescription for aerobic and resistive training based on initial evaluation findings, risk stratification, comorbidities and participant's personal  goals. Provide advice, education, support and counseling about physical activity/exercise needs.;Develop an individualized exercise prescription for aerobic and resistive training based on initial evaluation findings, risk stratification, comorbidities and participant's personal goals. Provide advice, education, support and counseling about physical activity/exercise needs.;Develop an individualized exercise prescription for aerobic and resistive training based on initial evaluation findings, risk stratification, comorbidities and participant's personal goals.     Expected Outcomes Short Term: Increase workloads from initial exercise prescription for resistance, speed, and METs.;Short Term: Perform resistance training exercises routinely during rehab and add in resistance training at home;Long Term: Improve cardiorespiratory fitness, muscular endurance and strength as measured by increased METs and functional capacity (6MWT) Short Term: Increase workloads from initial exercise prescription for resistance, speed, and METs.;Short Term: Perform resistance training exercises routinely during rehab and add in resistance training at home;Long Term:  Improve cardiorespiratory fitness, muscular endurance and strength as measured by increased METs and functional capacity (6MWT) Short Term: Increase workloads from initial exercise prescription for resistance, speed, and METs.;Short Term: Perform resistance training exercises routinely during rehab and add in resistance training at home;Long Term: Improve cardiorespiratory fitness, muscular endurance and strength as measured by increased METs and functional capacity (6MWT)     Able to understand and use rate of perceived exertion (RPE) scale Yes Yes Yes     Intervention Provide education and explanation on how to use RPE scale Provide education and explanation on how to use RPE scale Provide education and explanation on how to use RPE scale     Expected Outcomes Short Term:  Able to use RPE daily in rehab to express subjective intensity level;Long Term:  Able to use RPE to guide intensity level when exercising independently Short Term: Able to use RPE daily in rehab to express subjective intensity level;Long Term:  Able to use RPE to guide intensity level when exercising independently Short Term: Able to use RPE daily in rehab to express subjective intensity level;Long Term:  Able to use RPE to guide intensity level when exercising independently     Knowledge and understanding of Target Heart Rate Range (THRR) Yes Yes Yes     Intervention Provide education and explanation of THRR including how the numbers were predicted and where they are located for reference Provide education and explanation of THRR including how the numbers were predicted and where they are located for reference Provide education and explanation of THRR including how the numbers were predicted and where they are located for reference     Expected Outcomes Short Term: Able to state/look up THRR;Short Term: Able to use daily as guideline for intensity in rehab;Long Term: Able to use THRR to govern intensity when exercising independently Short Term: Able to state/look up THRR;Short Term: Able to use daily as guideline for intensity in rehab;Long Term: Able to use THRR to govern intensity when exercising independently Short Term: Able to state/look up THRR;Short Term: Able to use daily as guideline for intensity in rehab;Long Term: Able to use THRR to govern intensity when exercising independently     Able to check pulse independently Yes Yes Yes     Intervention Provide education and demonstration on how to check pulse in carotid and radial arteries.;Review the importance of being able to check your own pulse for safety during independent exercise Provide education and demonstration on how to check pulse in carotid and radial arteries.;Review the importance of being able to check your own pulse for safety during  independent exercise Provide education and demonstration on how to check pulse in carotid and radial arteries.;Review the importance of being able to check your own pulse for safety during independent exercise     Expected Outcomes Short Term: Able to explain why pulse checking is important during independent exercise;Long Term: Able to check pulse independently and accurately Short Term: Able to explain why pulse checking is important during independent exercise;Long Term: Able to check pulse independently and accurately Short Term: Able to explain why pulse checking is important during independent exercise;Long Term: Able to check pulse independently and accurately     Understanding of Exercise Prescription Yes Yes Yes     Intervention Provide education, explanation, and written materials on patient's individual exercise prescription Provide education, explanation, and written materials on patient's individual exercise prescription Provide education, explanation, and written materials on patient's individual exercise prescription  Expected Outcomes Short Term: Able to explain program exercise prescription;Long Term: Able to explain home exercise prescription to exercise independently Short Term: Able to explain program exercise prescription;Long Term: Able to explain home exercise prescription to exercise independently Short Term: Able to explain program exercise prescription;Long Term: Able to explain home exercise prescription to exercise independently              Exercise Goals Re-Evaluation :  Exercise Goals Re-Evaluation     Row Name 10/04/21 1535 11/02/21 1353           Exercise Goal Re-Evaluation   Exercise Goals Review Increase Physical Activity;Increase Strength and Stamina;Able to understand and use rate of perceived exertion (RPE) scale;Knowledge and understanding of Target Heart Rate Range (THRR);Able to check pulse independently;Understanding of Exercise Prescription Increase  Physical Activity;Increase Strength and Stamina;Able to understand and use rate of perceived exertion (RPE) scale;Knowledge and understanding of Target Heart Rate Range (THRR);Able to check pulse independently;Understanding of Exercise Prescription      Comments Pt has completed 6 sessions of cardiac rehab. She seems to be motivated to improve her health and has progressed her workload during class. She is currently working at 2.09 METs in the stepper. Will continue to monitor and progress as able. Pt has completed 15 sessions of cardiac rehab. She has been advised by a vascular surgeon due to her carotid artery blockage to back off from level 2 to 1 on the stepper. She still continues to smokes. She is currently exercising at 2.16 METs on the stepper. Will continue to monitor and progress as able.      Expected Outcomes Through exercise at home and at rehab, the patient will meet their stated goals. Through exercise at home and at rehab, the patient will meet their stated goals.                Discharge Exercise Prescription (Final Exercise Prescription Changes):  Exercise Prescription Changes - 10/29/21 1445       Response to Exercise   Blood Pressure (Admit) 125/60    Blood Pressure (Exercise) 118/72    Blood Pressure (Exit) 122/72    Heart Rate (Admit) 80 bpm    Heart Rate (Exercise) 106 bpm    Heart Rate (Exit) 89 bpm    Rating of Perceived Exertion (Exercise) 11    Duration Continue with 30 min of aerobic exercise without signs/symptoms of physical distress.    Intensity THRR unchanged      Progression   Progression Continue to progress workloads to maintain intensity without signs/symptoms of physical distress.      Resistance Training   Training Prescription Yes    Weight 3    Reps 10-15    Time 10 Minutes      NuStep   Level 1    SPM 142    Minutes 39    METs 2.16             Nutrition:  Target Goals: Understanding of nutrition guidelines, daily intake of  sodium 1500mg , cholesterol 200mg , calories 30% from fat and 7% or less from saturated fats, daily to have 5 or more servings of fruits and vegetables.  Biometrics:  Pre Biometrics - 09/16/21 1440       Pre Biometrics   Height 5' 1.5" (1.562 m)    Weight 80.8 kg    Waist Circumference 42.5 inches    Hip Circumference 45 inches    Waist to Hip Ratio 0.94 %    BMI (  Calculated) 33.12    Triceps Skinfold 19 mm    % Body Fat 42.6 %    Grip Strength 20 kg    Flexibility 12 in    Single Leg Stand 12 seconds              Nutrition Therapy Plan and Nutrition Goals:   Nutrition Assessments:  Nutrition Assessments - 09/16/21 1323       MEDFICTS Scores   Pre Score 87            MEDIFICTS Score Key: ?70 Need to make dietary changes  40-70 Heart Healthy Diet ? 40 Therapeutic Level Cholesterol Diet   Picture Your Plate Scores: <69 Unhealthy dietary pattern with much room for improvement. 41-50 Dietary pattern unlikely to meet recommendations for good health and room for improvement. 51-60 More healthful dietary pattern, with some room for improvement.  >60 Healthy dietary pattern, although there may be some specific behaviors that could be improved.    Nutrition Goals Re-Evaluation:   Nutrition Goals Discharge (Final Nutrition Goals Re-Evaluation):   Psychosocial: Target Goals: Acknowledge presence or absence of significant depression and/or stress, maximize coping skills, provide positive support system. Participant is able to verbalize types and ability to use techniques and skills needed for reducing stress and depression.  Initial Review & Psychosocial Screening:  Initial Psych Review & Screening - 09/27/21 0827       Initial Review   Source of Stress Concerns --    Comments --             Quality of Life Scores:  Quality of Life - 09/16/21 1446       Quality of Life   Select Quality of Life      Quality of Life Scores   Health/Function Pre  17.45 %    Socioeconomic Pre 18.5 %    Psych/Spiritual Pre 12.5 %    Family Pre 30 %    GLOBAL Pre 19.22 %            Scores of 19 and below usually indicate a poorer quality of life in these areas.  A difference of  2-3 points is a clinically meaningful difference.  A difference of 2-3 points in the total score of the Quality of Life Index has been associated with significant improvement in overall quality of life, self-image, physical symptoms, and general health in studies assessing change in quality of life.  PHQ-9: Recent Review Flowsheet Data     Depression screen [facility] 2/9 09/16/2021   Decreased Interest 3   Down, Depressed, Hopeless 3   PHQ - 2 Score 6   Altered sleeping 2   Tired, decreased energy 3   Change in appetite 3    Feeling bad or failure about yourself  3   Trouble concentrating 2   Moving slowly or fidgety/restless 0   Suicidal thoughts 0   PHQ-9 Score 19   Difficult doing work/chores Very difficult      Interpretation of Total Score  Total Score Depression Severity:  1-4 = Minimal depression, 5-9 = Mild depression, 10-14 = Moderate depression, 15-19 = Moderately severe depression, 20-27 = Severe depression   Psychosocial Evaluation and Intervention:  Psychosocial Evaluation - 09/16/21 1437       Psychosocial Evaluation & Interventions   Interventions Stress management education;Relaxation education;Encouraged to exercise with the program and follow exercise prescription    Comments Pt's transportation could be a barrier to participation in rehab. She reports that her car  is unreliable but it should be okay to come to rehab. She has several identfiable psychosocial issues. She has anxiety and depression which is treated with xanax and buspar. She reports that her PCP recently cut her xanax prescription in half and that she has felt more depressed since this. She was seeing a psychiatrist in [place] but she reports that she had a panic attack during an  appointment and that the psychiatrist will no longer see her. She has been referred to another psychiatrist in [place] but states that this is too far for her to drive. She also states that [facility] would be too far to drive as well. She scored a 19 on her PHQ-9. She reports little interest in doing things and feeling depressed almost everyday. Her only support system is one of her friend. She has several sources of stress. Her primary source is her health. Along with her heart issues, she also has poorly controlled DM and she has recently been referred to pulmonlogy due to a suspected pulmonary conditon. Her living situation causes her stress. Her son and brother both live with her. She reports that her son is a recovering addict and currently does not have a job to help pay bills. She reports that her brother has early stage dementia and also has terrible personal hygiene. She is hopeful that once her son gets a job that he can move out. She is currently smoking about a pack of cigarettes per day. She has cut back from three packs per day before her NSTEMI. She has a plan to stop. She just bought nicotine patches and will call 1800quitnow if she needs assistance. She is going to pick a quit date soon. She reports that both her brother and son smoke cigarettes in her house which could be trigger for her to relapse. She reports that she will tell them that it is her house and if they want to live there they will have to smoke outside. She in confident in her plan. She is very nervous about starting the program but she does have a positive outlook that it will help her.    Expected Outcomes Pt's anxiety and depression will be better contolled through medication and patient will see a psychiatrist. Her stressors will be reduced or eliminated. She will follow through with her smoking cessation plan.    Continue Psychosocial Services  Follow up required by staff             Psychosocial Re-Evaluation:   Psychosocial Re-Evaluation     [place] Name 09/27/21 0827 10/21/21 0809           Psychosocial Re-Evaluation   Current issues with Current Depression;Current Anxiety/Panic;Current Stress Concerns Current Depression;Current Anxiety/Panic;Current Stress Concerns      Comments Patient is new to the program completing 1 sessions. She continues on Alprazolam and Buspar to manage her depression and anxiety. We will continue to monitor her progress and management of psychosocial issures. Patient has completed 11 sessions. Her pcp manages her psychosocial issues of depression and anxiety. She saw him 12/6 and decreased her Buspar and she conitnues on Alprazolam. He feels her depressions and anxiety are improved stating she was not as tearful during the visit. Patient feels her psychosocial issues are managed. She seems to enjoy coming to the program and demonstrates an interest in improving her health. We will continue to monitor.      Expected Outcomes Patient's psychosocial issues will continue to be managed with no barriers identiified. Patient's  psychosocial issues will continue to be managed with no barriers identiified.      Interventions Stress management education;Relaxation education;Encouraged to attend Cardiac Rehabilitation for the exercise Stress management education;Relaxation education;Encouraged to attend Cardiac Rehabilitation for the exercise      Continue Psychosocial Services  -- No Follow up required        Initial Review   Source of Stress Concerns -- Chronic Illness;Family      Comments -- Her brother and her son live with her. Her son is a recovering addict. Her brother has dementia and has poor hygiene. Her largest souce of stress is her health and her medications.               Psychosocial Discharge (Final Psychosocial Re-Evaluation):  Psychosocial Re-Evaluation - 10/21/21 0809       Psychosocial Re-Evaluation   Current issues with Current Depression;Current  Anxiety/Panic;Current Stress Concerns    Comments Patient has completed 11 sessions. Her pcp manages her psychosocial issues of depression and anxiety. She saw him 12/6 and decreased her Buspar and she conitnues on Alprazolam. He feels her depressions and anxiety are improved stating she was not as tearful during the visit. Patient feels her psychosocial issues are managed. She seems to enjoy coming to the program and demonstrates an interest in improving her health. We will continue to monitor.    Expected Outcomes Patient's psychosocial issues will continue to be managed with no barriers identiified.    Interventions Stress management education;Relaxation education;Encouraged to attend Cardiac Rehabilitation for the exercise    Continue Psychosocial Services  No Follow up required      Initial Review   Source of Stress Concerns Chronic Illness;Family    Comments Her brother and her son live with her. Her son is a recovering addict. Her brother has dementia and has poor hygiene. Her largest souce of stress is her health and her medications.             Vocational Rehabilitation: Provide vocational rehab assistance to qualifying candidates.   Vocational Rehab Evaluation & Intervention:  Vocational Rehab - 09/16/21 1335       Initial Vocational Rehab Evaluation & Intervention   Assessment shows need for Vocational Rehabilitation No             Education: Education Goals: Education classes will be provided on a weekly basis, covering required topics. Participant will state understanding/return demonstration of topics presented.  Learning Barriers/Preferences:  Learning Barriers/Preferences - 09/16/21 1329       Learning Barriers/Preferences   Learning Barriers Sight    Learning Preferences Audio             Education Topics: Hypertension, Hypertension Reduction -Define heart disease and high blood pressure. Discus how high blood pressure affects the body and ways to  reduce high blood pressure.   Exercise and Your Heart -Discuss why it is important to exercise, the FITT principles of exercise, normal and abnormal responses to exercise, and how to exercise safely.   Angina -Discuss definition of angina, causes of angina, treatment of angina, and how to decrease risk of having angina.   Cardiac Medications -Review what the following cardiac medications are used for, how they affect the body, and side effects that may occur when taking the medications.  Medications include Aspirin, Beta blockers, calcium channel blockers, ACE Inhibitors, angiotensin receptor blockers, diuretics, digoxin, and antihyperlipidemics.   Congestive Heart Failure -Discuss the definition of CHF, how to live with CHF, the signs and symptoms  of CHF, and how keep track of weight and sodium intake.   Heart Disease and Intimacy -Discus the effect sexual activity has on the heart, how changes occur during intimacy as we age, and safety during sexual activity.   Smoking Cessation / COPD -Discuss different methods to quit smoking, the health benefits of quitting smoking, and the definition of COPD.   Nutrition I: Fats -Discuss the types of cholesterol, what cholesterol does to the heart, and how cholesterol levels can be controlled.   Nutrition II: Labels -Discuss the different components of food labels and how to read food label [place] from 10/27/2021 in [place]  Date 10/06/21  Educator pb  Instruction Review Code 1- Verbalizes Understanding       Heart Parts/Heart Disease and PAD -Discuss the anatomy of the heart, the pathway of blood circulation through the heart, and these are affected by heart disease.   Stress I: Signs and Symptoms -Discuss the causes of stress, how stress may lead to anxiety and depression, and ways to limit stress. Flowsheet Row CARDIAC REHAB PHASE II EXERCISE from 10/27/2021 in  [place]  Date 10/20/21  Educator DJ  Instruction Review Code 1- Verbalizes Understanding       Stress II: Relaxation -Discuss different types of relaxation techniques to limit stress. Flowsheet Row CARDIAC REHAB PHASE II EXERCISE from 10/27/2021 in [place]  Date 10/27/21  Educator hj  Instruction Review Code 2- Demonstrated Understanding       Warning Signs of Stroke / TIA -Discuss definition of a stroke, what the signs and symptoms are of a stroke, and how to identify when someone is having stroke.   Knowledge Questionnaire Score:  Knowledge Questionnaire Score - 09/16/21 1329       Knowledge Questionnaire Score   Pre Score 19/28             Core Components/Risk Factors/Patient Goals at Admission:  Personal Goals and Risk Factors at Admission - 09/16/21 1335       Core Components/Risk Factors/Patient Goals on Admission    Weight Management Yes;Weight Loss;Obesity    Intervention Weight Management/Obesity: Establish reasonable short term and long term weight goals.;Obesity: Provide education and appropriate resources to help participant work on and attain dietary goals.;Weight Management: Develop a combined nutrition and exercise program designed to reach desired caloric intake, while maintaining appropriate intake of nutrient and fiber, sodium and fats, and appropriate energy expenditure required for the weight goal.;Weight Management: Provide education and appropriate resources to help participant work on and attain dietary goals.    Expected Outcomes Short Term: Continue to assess and modify interventions until short term weight is achieved;Long Term: Adherence to nutrition and physical activity/exercise program aimed toward attainment of established weight goal;Weight Loss: Understanding of general recommendations for a balanced deficit meal plan, which promotes 1-2 lb weight loss per week and includes a negative energy  balance of [phone] kcal/d;Understanding recommendations for meals to include 15-35% energy as protein, 25-35% energy from fat, 35-60% energy from carbohydrates, less than 200mg  of dietary cholesterol, 20-35 gm of total fiber daily;Understanding of distribution of calorie intake throughout the day with the consumption of 4-5 meals/snacks;Weight Maintenance: Understanding of the daily nutrition guidelines, which includes 25-35% calories from fat, 7% or less cal from saturated fats, less than 200mg  cholesterol, less than 1.5gm of sodium, & 5 or more servings of fruits and vegetables daily    Tobacco Cessation Yes  Number of packs per day 1    Intervention Assist the participant in steps to quit. Provide individualized education and counseling about committing to Tobacco Cessation, relapse prevention, and pharmacological support that can be provided by physician.;Advice worker, assist with locating and accessing local/national Quit Smoking programs, and support quit date choice.    Expected Outcomes Short Term: Will demonstrate readiness to quit, by selecting a quit date.;Short Term: Will quit all tobacco product use, adhering to prevention of relapse plan.;Long Term: Complete abstinence from all tobacco products for at least 12 months from quit date.    Improve shortness of breath with ADL's Yes    Intervention Provide education, individualized exercise plan and daily activity instruction to help decrease symptoms of SOB with activities of daily living.    Expected Outcomes Short Term: Improve cardiorespiratory fitness to achieve a reduction of symptoms when performing ADLs;Long Term: Be able to perform more ADLs without symptoms or delay the onset of symptoms    Diabetes Yes    Intervention Provide education about signs/symptoms and action to take for hypo/hyperglycemia.;Provide education about proper nutrition, including hydration, and aerobic/resistive exercise prescription along with  prescribed medications to achieve blood glucose in normal ranges: Fasting glucose 65-99 mg/dL    Expected Outcomes Short Term: Participant verbalizes understanding of the signs/symptoms and immediate care of hyper/hypoglycemia, proper foot care and importance of medication, aerobic/resistive exercise and nutrition plan for blood glucose control.;Long Term: Attainment of HbA1C < 7%.    Hypertension Yes    Intervention Provide education on lifestyle modifcations including regular physical activity/exercise, weight management, moderate sodium restriction and increased consumption of fresh fruit, vegetables, and low fat dairy, alcohol moderation, and smoking cessation.;Monitor prescription use compliance.    Expected Outcomes Short Term: Continued assessment and intervention until BP is < 140/55mm HG in hypertensive participants. < 130/35mm HG in hypertensive participants with diabetes, heart failure or chronic kidney disease.;Long Term: Maintenance of blood pressure at goal levels.    Lipids Yes    Intervention Provide education and support for participant on nutrition & aerobic/resistive exercise along with prescribed medications to achieve LDL 70mg , HDL >40mg .    Expected Outcomes Short Term: Participant states understanding of desired cholesterol values and is compliant with medications prescribed. Participant is following exercise prescription and nutrition guidelines.;Long Term: Cholesterol controlled with medications as prescribed, with individualized exercise RX and with personalized nutrition plan. Value goals: LDL < 70mg , HDL > 40 mg.    Stress Yes    Intervention Offer individual and/or small group education and counseling on adjustment to heart disease, stress management and health-related lifestyle change. Teach and support self-help strategies.    Expected Outcomes Short Term: Participant demonstrates changes in health-related behavior, relaxation and other stress management skills, ability to  obtain effective social support, and compliance with psychotropic medications if prescribed.;Long Term: Emotional wellbeing is indicated by absence of clinically significant psychosocial distress or social isolation.             Core Components/Risk Factors/Patient Goals Review:   Goals and Risk Factor Review     Row Name 09/27/21 0832 10/21/21 0811           Core Components/Risk Factors/Patient Goals Review   Personal Goals Review Weight Management/Obesity;Diabetes;Tobacco Cessation Weight Management/Obesity;Diabetes;Tobacco Cessation      Review Patient was referred to [facility] with NSTEMI and S/P PTCA by Dr. Harl Bowie. She has multiple risk factors for CAD and is participating in the program for risk modification. She has completed 1 session.  Her personal goals for the program are to quit smoking; lose weight; gain strength; and be able to cope with stress better. We will continue to monitor her progress as she works towards meeting these goals. Patient has completed 11 sessions. Her initial weight was 178.0 lbs and her current weight is 174.4 lbs. She is doing well in the program with progressions and consistent attendance. Her blood pressure is well controlled. She saw her pcp 12/6 for a routine check. Her HgbA1C was 9.2% which is trending down from 10.6%. Her increased both her mealtime and bedtime insulin dosage and she continues on Jardiance for DM controll. He also decreased her Lipitor. She is trying to quit smoking. She is using nicotine patches to help with smoking cessation. She is currently smoking 3 cigerattes/day down from 10/day when she started the program. She is pleased with her progress in the program. Her personal goals continue to be to quit smoking; lose weight; gain strength and be able to cope with stress better. We will continue to monitor her progress as she works towards meeting these goals.      Expected Outcomes Patient will complete the program meeting both personal and  program goals. Patient will complete the program meeting both personal and program goals.               Core Components/Risk Factors/Patient Goals at Discharge (Final Review):   Goals and Risk Factor Review - 10/21/21 0811       Core Components/Risk Factors/Patient Goals Review   Personal Goals Review Weight Management/Obesity;Diabetes;Tobacco Cessation    Review Patient has completed 11 sessions. Her initial weight was 178.0 lbs and her current weight is 174.4 lbs. She is doing well in the program with progressions and consistent attendance. Her blood pressure is well controlled. She saw her pcp 12/6 for a routine check. Her HgbA1C was 9.2% which is trending down from 10.6%. Her increased both her mealtime and bedtime insulin dosage and she continues on Jardiance for DM controll. He also decreased her Lipitor. She is trying to quit smoking. She is using nicotine patches to help with smoking cessation. She is currently smoking 3 cigerattes/day down from 10/day when she started the program. She is pleased with her progress in the program. Her personal goals continue to be to quit smoking; lose weight; gain strength and be able to cope with stress better. We will continue to monitor her progress as she works towards meeting these goals.    Expected Outcomes Patient will complete the program meeting both personal and program goals.             ITP Comments:   Comments: ITP REVIEW Pt is making expected progress toward Cardiac Rehab goals after completing 14 sessions. Recommend continued exercise, life style modification, education, and increased stamina and strength.

## 2021-11-05 ENCOUNTER — Encounter (HOSPITAL_COMMUNITY): Payer: Medicare Other

## 2021-11-08 ENCOUNTER — Encounter (HOSPITAL_COMMUNITY)
Admission: RE | Admit: 2021-11-08 | Discharge: 2021-11-08 | Disposition: A | Discharge status: Home / Self Care | Payer: Medicare Other | Source: Ambulatory Visit | Attending: Cardiology | Admitting: Cardiology

## 2021-11-08 DIAGNOSIS — Z9861 Coronary angioplasty status: Secondary | ICD-10-CM | POA: Insufficient documentation

## 2021-11-08 DIAGNOSIS — I214 Non-ST elevation (NSTEMI) myocardial infarction: Secondary | ICD-10-CM | POA: Insufficient documentation

## 2021-11-08 NOTE — Progress Notes (Signed)
Daily Session Note  Patient Details  Name: Katelyn Kelly MRN: [id number] Date of Birth: January 08, 1961 Referring Provider:   Flowsheet Row CARDIAC REHAB PHASE II ORIENTATION from 09/16/2021 in [place]  Referring Provider Dr. Harl Bowie       Encounter Date: 11/08/2021  Check In:  Session Check In - 11/08/21 1445       Check-In   Supervising physician immediately available to respond to emergencies [facility] MD immediately available    Physician(s) Dr. Harrington Challenger    Location [facility]    Staff Present Redge Gainer, BS, Exercise Physiologist;Other    Virtual Visit No    Medication changes reported     No    Fall or balance concerns reported    No    Tobacco Cessation No Change    Warm-up and Cool-down Performed as group-led instruction    Resistance Training Performed Yes    VAD Patient? No    PAD/SET Patient? No      Pain Assessment   Currently in Pain? No/denies    Pain Score 0-No pain    Multiple Pain Sites No             Capillary Blood Glucose: No results found for this or any previous visit (from the past 24 hour(s)).    Social History   Tobacco Use  Smoking Status Every Day   Packs/day: 1.00   Years: 40.00   Pack years: 40.00   Types: Cigarettes  Smokeless Tobacco Never  Tobacco Comments   one pack a day. Pt has purchased nicotine patches. She has a plan to stop and is going to set a quit date.     Goals Met:  Independence with exercise equipment Exercise tolerated well No report of concerns or symptoms today Strength training completed today  Goals Unmet:  Not Applicable  Comments: check out 1545   Dr. Kathie Dike is Medical Director for [facility].

## 2021-11-10 ENCOUNTER — Encounter (HOSPITAL_COMMUNITY)
Admission: RE | Admit: 2021-11-10 | Discharge: 2021-11-10 | Disposition: A | Discharge status: Home / Self Care | Payer: Medicare Other | Source: Ambulatory Visit | Attending: Cardiology | Admitting: Cardiology

## 2021-11-10 DIAGNOSIS — I214 Non-ST elevation (NSTEMI) myocardial infarction: Secondary | ICD-10-CM

## 2021-11-10 DIAGNOSIS — Z9861 Coronary angioplasty status: Secondary | ICD-10-CM

## 2021-11-10 NOTE — Progress Notes (Signed)
Daily Session Note  Patient Details  Name: Katelyn Kelly MRN: [id number] Date of Birth: [birth year] Referring Provider:   Flowsheet Row CARDIAC REHAB PHASE II ORIENTATION from 09/16/2021 in [place]  Referring Provider Dr. Harl Bowie       Encounter Date: 11/10/2021  Check In:  Session Check In - 11/10/21 1443       Check-In   Supervising physician immediately available to respond to emergencies [facility] MD immediately available    Physician(s) Dr. Gardiner Rhyme    Location [facility]    Staff Present Redge Gainer, BS, Exercise Physiologist;Debra Wynetta Emery, RN, BSN    Virtual Visit No    Medication changes reported     No    Fall or balance concerns reported    No    Tobacco Cessation No Change    Warm-up and Cool-down Performed as group-led instruction    Resistance Training Performed Yes    VAD Patient? No    PAD/SET Patient? No      Pain Assessment   Currently in Pain? No/denies    Pain Score 0-No pain    Multiple Pain Sites No             Capillary Blood Glucose: No results found for this or any previous visit (from the past 24 hour(s)).    Social History   Tobacco Use  Smoking Status Every Day   Packs/day: 1.00   Years: 40.00   Pack years: 40.00   Types: Cigarettes  Smokeless Tobacco Never  Tobacco Comments   one pack a day. Pt has purchased nicotine patches. She has a plan to stop and is going to set a quit date.     Goals Met:  Independence with exercise equipment Exercise tolerated well No report of concerns or symptoms today Strength training completed today  Goals Unmet:  Not Applicable  Comments: check out 1545   Dr. Kathie Dike is Medical Director for [facility] Pul[facility] Re[facility].

## 2021-11-12 ENCOUNTER — Encounter (HOSPITAL_COMMUNITY)
Admission: RE | Admit: 2021-11-12 | Discharge: 2021-11-12 | Disposition: A | Discharge status: Home / Self Care | Payer: Medicare Other | Source: Ambulatory Visit | Attending: Cardiology | Admitting: Cardiology

## 2021-11-12 DIAGNOSIS — Z9861 Coronary angioplasty status: Secondary | ICD-10-CM | POA: Diagnosis not present

## 2021-11-12 DIAGNOSIS — I214 Non-ST elevation (NSTEMI) myocardial infarction: Secondary | ICD-10-CM

## 2021-11-12 NOTE — Progress Notes (Signed)
Daily Session Note  Patient Details  Name: Katelyn Kelly MRN: [id number] Date of Birth: 02/20/1961 Referring Provider:   Flowsheet Row CARDIAC REHAB PHASE II ORIENTATION from 09/16/2021 in [place]  Referring Provider Dr. Harl Bowie       Encounter Date: 11/12/2021  Check In:  Session Check In - 11/12/21 1438       Check-In   Supervising physician immediately available to respond to emergencies [facility] MD immediately available    Physician(s) Dr. Domenic Polite    Location [facility]    Staff Present Redge Gainer, BS, Exercise Physiologist;Debra Wynetta Emery, RN, Bjorn Loser, MS, ACSM-CEP, Exercise Physiologist    Virtual Visit No    Medication changes reported     No    Fall or balance concerns reported    No    Tobacco Cessation No Change    Warm-up and Cool-down Performed as group-led instruction    Resistance Training Performed Yes    VAD Patient? No    PAD/SET Patient? No      Pain Assessment   Currently in Pain? No/denies    Pain Score 0-No pain    Multiple Pain Sites No             Capillary Blood Glucose: No results found for this or any previous visit (from the past 24 hour(s)).    Social History   Tobacco Use  Smoking Status Every Day   Packs/day: 1.00   Years: 40.00   Pack years: 40.00   Types: Cigarettes  Smokeless Tobacco Never  Tobacco Comments   one pack a day. Pt has purchased nicotine patches. She has a plan to stop and is going to set a quit date.     Goals Met:  Independence with exercise equipment Exercise tolerated well No report of concerns or symptoms today Strength training completed today  Goals Unmet:  Not Applicable  Comments: checkout time is 1545   Dr. Kathie Dike is Medical Director for [facility] Pulmonary Re[facility].

## 2021-11-15 ENCOUNTER — Encounter (HOSPITAL_COMMUNITY)
Admission: RE | Admit: 2021-11-15 | Discharge: 2021-11-15 | Disposition: A | Discharge status: Home / Self Care | Payer: Medicare Other | Source: Ambulatory Visit | Attending: Cardiology | Admitting: Cardiology

## 2021-11-15 ENCOUNTER — Other Ambulatory Visit: Payer: Self-pay

## 2021-11-15 ENCOUNTER — Other Ambulatory Visit (HOSPITAL_COMMUNITY)
Admission: RE | Admit: 2021-11-15 | Discharge: 2021-11-15 | Disposition: A | Discharge status: Home / Self Care | Payer: Medicare Other | Source: Ambulatory Visit | Attending: Physician Assistant | Admitting: Physician Assistant

## 2021-11-15 VITALS — Wt 178.4 lb

## 2021-11-15 DIAGNOSIS — E1169 Type 2 diabetes mellitus with other specified complication: Secondary | ICD-10-CM | POA: Insufficient documentation

## 2021-11-15 DIAGNOSIS — Z9861 Coronary angioplasty status: Secondary | ICD-10-CM

## 2021-11-15 DIAGNOSIS — E785 Hyperlipidemia, unspecified: Secondary | ICD-10-CM | POA: Diagnosis present

## 2021-11-15 DIAGNOSIS — I214 Non-ST elevation (NSTEMI) myocardial infarction: Secondary | ICD-10-CM

## 2021-11-15 LAB — LIPID PANEL
Cholesterol: 122 mg/dL (ref 0–200)
HDL: 53 mg/dL (ref >40)
LDL Cholesterol: 44 mg/dL (ref 0–99)
Total CHOL/HDL Ratio: 2.3 RATIO
Triglycerides: 125 mg/dL (ref <150)
VLDL: 25 mg/dL (ref 0–40)

## 2021-11-15 NOTE — Progress Notes (Signed)
Daily Session Note  Patient Details  Name: Katelyn Kelly MRN: [id number] Date of Birth: 05-Nov-1960 Referring Provider:   Flowsheet Row CARDIAC REHAB PHASE II ORIENTATION from 09/16/2021 in [place]  Referring Provider Dr. Harl Bowie       Encounter Date: 11/15/2021  Check In:  Session Check In - 11/15/21 1443       Check-In   Supervising physician immediately available to respond to emergencies [facility] MD immediately available    Physician(s) Dr. Harl Bowie    Location [facility]    Staff Present Geanie Cooley, RN;Dalton Kris Mouton, MS, ACSM-CEP, Exercise Physiologist;Heather Zigmund Daniel, Exercise Physiologist    Virtual Visit No    Medication changes reported     No    Fall or balance concerns reported    No    Tobacco Cessation Use Decreased    Current number of cigarettes/nicotine per day     15    Warm-up and Cool-down Performed as group-led instruction    Resistance Training Performed Yes    VAD Patient? No    PAD/SET Patient? No      Pain Assessment   Currently in Pain? No/denies    Pain Score 0-No pain    Multiple Pain Sites No             Capillary Blood Glucose: Results for orders placed or performed during the hospital encounter of 11/15/21 (from the past 24 hour(s))  Lipid panel     Status: None   Collection Time: 11/15/21  9:23 AM  Result Value Ref Range   Cholesterol 122 0 - 200 mg/dL   Triglycerides 125 <150 mg/dL   HDL 53 >40 mg/dL   Total CHOL/HDL Ratio 2.3 RATIO   VLDL 25 0 - 40 mg/dL   LDL Cholesterol 44 0 - 99 mg/dL      Social History   Tobacco Use  Smoking Status Every Day   Packs/day: 1.00   Years: 40.00   Pack years: 40.00   Types: Cigarettes  Smokeless Tobacco Never  Tobacco Comments   one pack a day. Pt has purchased nicotine patches. She has a plan to stop and is going to set a quit date.     Goals Met:  Independence with exercise equipment Exercise tolerated well No report of  concerns or symptoms today Strength training completed today  Goals Unmet:  Not Applicable  Comments: check out @ 3;45pm   Dr. Kathie Dike is Medical Director for [facility] Re[facility].

## 2021-11-17 ENCOUNTER — Encounter (HOSPITAL_COMMUNITY)
Admission: RE | Admit: 2021-11-17 | Discharge: 2021-11-17 | Disposition: A | Discharge status: Home / Self Care | Payer: Medicare Other | Source: Ambulatory Visit | Attending: Cardiology | Admitting: Cardiology

## 2021-11-17 DIAGNOSIS — Z9861 Coronary angioplasty status: Secondary | ICD-10-CM

## 2021-11-17 DIAGNOSIS — I214 Non-ST elevation (NSTEMI) myocardial infarction: Secondary | ICD-10-CM

## 2021-11-17 NOTE — Progress Notes (Signed)
Daily Session Note  Patient Details  Name: Katelyn Kelly MRN: [id number] Date of Birth: 02-Aug-1961 Referring Provider:   Flowsheet Row CARDIAC REHAB PHASE II ORIENTATION from 09/16/2021 in [place]  Referring Provider Dr. Harl Bowie       Encounter Date: 11/17/2021  Check In:  Session Check In - 11/17/21 1445       Check-In   Supervising physician immediately available to respond to emergencies [facility] MD immediately available    Physician(s) Dr. Gardiner Rhyme    Location [facility]    Staff Present Redge Gainer, BS, Exercise Physiologist;Kartier Bennison Kris Mouton, MS, ACSM-CEP, Exercise Physiologist;Other    Virtual Visit No    Medication changes reported     No    Fall or balance concerns reported    No    Tobacco Cessation Use Decreased    Current number of cigarettes/nicotine per day     15    Warm-up and Cool-down Performed as group-led instruction    Resistance Training Performed Yes    VAD Patient? No    PAD/SET Patient? No      Pain Assessment   Currently in Pain? No/denies    Pain Score 0-No pain    Multiple Pain Sites No             Capillary Blood Glucose: No results found for this or any previous visit (from the past 24 hour(s)).    Social History   Tobacco Use  Smoking Status Every Day   Packs/day: 1.00   Years: 40.00   Pack years: 40.00   Types: Cigarettes  Smokeless Tobacco Never  Tobacco Comments   one pack a day. Pt has purchased nicotine patches. She has a plan to stop and is going to set a quit date.     Goals Met:  Independence with exercise equipment Exercise tolerated well No report of concerns or symptoms today Strength training completed today  Goals Unmet:  Not Applicable  Comments: checkout time is 1545   Dr. Kathie Dike is Medical Director for [facility].

## 2021-11-19 ENCOUNTER — Encounter: Payer: Self-pay | Admitting: Cardiology

## 2021-11-19 ENCOUNTER — Other Ambulatory Visit: Payer: Self-pay

## 2021-11-19 ENCOUNTER — Ambulatory Visit (INDEPENDENT_AMBULATORY_CARE_PROVIDER_SITE_OTHER): Payer: Medicare Other | Admitting: Cardiology

## 2021-11-19 ENCOUNTER — Encounter (HOSPITAL_COMMUNITY)
Admission: RE | Admit: 2021-11-19 | Discharge: 2021-11-19 | Disposition: A | Discharge status: Home / Self Care | Payer: Medicare Other | Source: Ambulatory Visit | Attending: Cardiology | Admitting: Cardiology

## 2021-11-19 VITALS — BP 120/70 | HR 90 | Ht 61.5 in | Wt 177.6 lb

## 2021-11-19 DIAGNOSIS — E782 Mixed hyperlipidemia: Secondary | ICD-10-CM | POA: Diagnosis not present

## 2021-11-19 DIAGNOSIS — Z9861 Coronary angioplasty status: Secondary | ICD-10-CM | POA: Diagnosis not present

## 2021-11-19 DIAGNOSIS — I2510 Atherosclerotic heart disease of native coronary artery without angina pectoris: Secondary | ICD-10-CM | POA: Diagnosis not present

## 2021-11-19 DIAGNOSIS — I214 Non-ST elevation (NSTEMI) myocardial infarction: Secondary | ICD-10-CM

## 2021-11-19 MED ORDER — ISOSORBIDE MONONITRATE ER 60 MG PO TB24: 60.0000 mg | ORAL_TABLET | Freq: Two times a day (BID) | ORAL | 3 refills | Status: DC

## 2021-11-19 NOTE — Patient Instructions (Signed)
Medication Instructions:   INCREASE Imdur to 60 mg twice a day  Labwork: None   Testing/Procedures: None   Follow-Up: 6 months  Any Other Special Instructions Will Be Listed Below (If Applicable).  If you need a refill on your cardiac medications before your next appointment, please call your pharmacy.

## 2021-11-19 NOTE — Progress Notes (Signed)
Daily Session Note  Patient Details  Name: Katelyn Kelly MRN: [id number] Date of Birth: Apr 21, 1961 Referring Provider:   Flowsheet Row CARDIAC REHAB PHASE II ORIENTATION from 09/16/2021 in [place]  Referring Provider Dr. Harl Bowie       Encounter Date: 11/19/2021  Check In:  Session Check In - 11/19/21 1437       Check-In   Supervising physician immediately available to respond to emergencies [facility] MD immediately available    Physician(s) O'Neal    Location [facility]    Staff Present Redge Gainer, BS, Exercise Physiologist;Custer Pimenta Wynetta Emery, RN, BSN    Virtual Visit No    Medication changes reported     No    Fall or balance concerns reported    No    Tobacco Cessation Use Increase   Patient not smoking 1ppd.   Current number of cigarettes/nicotine per day     25    Warm-up and Cool-down Performed as group-led instruction    Resistance Training Performed Yes    VAD Patient? No    PAD/SET Patient? No      Pain Assessment   Currently in Pain? No/denies    Pain Score 0-No pain    Multiple Pain Sites No             Capillary Blood Glucose: No results found for this or any previous visit (from the past 24 hour(s)).    Social History   Tobacco Use  Smoking Status Every Day   Packs/day: 1.00   Years: 40.00   Pack years: 40.00   Types: Cigarettes  Smokeless Tobacco Never  Tobacco Comments   one pack a day. Pt has purchased nicotine patches. She has a plan to stop and is going to set a quit date.     Goals Met:  Independence with exercise equipment Exercise tolerated well No report of concerns or symptoms today Strength training completed today  Goals Unmet:  Not Applicable  Comments: Check out 1545.   Dr. Kathie Dike is Medical Director for [facility].

## 2021-11-19 NOTE — Progress Notes (Signed)
Clinical Summary Ms. Cranor is a 61 y.o.female last seen by PA Lenze, this is our first visit together. Seen for the following medical problems.  1.CAD - admitted 05/2021 with NSTEMI - 05/2021 echo LVEF 60-65%, grade I dd - 05/2021 95% ramuse, received PTCA only.  - participating in cardiac rehab  - some recent chest pains - sharp/pressure midchest. Could occur at rest or with exertion. 7-8/10 in severity. +SOB, panic like feeling. Radiated in bewteen shoulder pain. Would last few minutes. Could awake her from sleep. Last episode was a few weeks ago, has improved with cardiac rehab. Would improve with NG  - left neck pain radiates down into shoulder, arm, and left hand. Tightness/tinging feeling. Not positional. Can last all day long.      2.DM2 - followed by pcp   3. Hyperlipidemia Jan 2022 TC 122 TG 125 HDL 53 LDL 44    4. Anxiety/depression  Past Medical History:  Diagnosis Date   Anxiety and depression    Bilateral carpal tunnel syndrome    Chronic back pain    Chronic neck pain    Depression    Diabetes mellitus    GERD (gastroesophageal reflux disease)    H/O syncope    Headache    History of panic attacks    Hypertension    IBS (irritable bowel syndrome)    Manic depression (HCC)    Migraine    Panic attack      Allergies  Allergen Reactions   Meprobamate Hives and Swelling   Sinequan [Doxepin Hcl] Anaphylaxis   Tranxene [Clorazepate Dipotassium] Hives and Swelling   Albuterol Other (See Comments)    Patient does not remember the reaction. This was a record provided via [facility]   Haloperidol Other (See Comments)    Patient does not remember the reaction. This was a record provided via [facility]   Sertraline Other (See Comments)    Patient does not remember the reaction. This was a record provided via [facility] Other (See Comments)    Patient does not remember the reaction. This was a record provided via  [facility]     Current Outpatient Medications  Medication Sig Dispense Refill   acetaminophen (TYLENOL) 650 MG CR tablet Take 1,300 mg by mouth every 8 (eight) hours as needed for pain.     ALPRAZolam (XANAX) 0.5 MG tablet Take 1 tablet (0.5 mg total) by mouth in the morning, at noon, in the evening, and at bedtime. (Patient taking differently: Take 0.5 mg by mouth 2 (two) times daily.)     aspirin 81 MG EC tablet Take 1 tablet (81 mg total) by mouth daily. Swallow whole. (Patient taking differently: Take 81 mg by mouth at bedtime. Swallow whole.) 90 tablet 2   atorvastatin (LIPITOR) 80 MG tablet Take 1 tablet (80 mg total) by mouth at bedtime. 90 tablet 1   busPIRone (BUSPAR) 7.5 MG tablet Take by mouth See admin instructions. Take 0.5 tablets (3.75 mg total) by mouth 3 times daily for 7 days, THEN 1 tablet (7.5 mg total) 3 times daily for 7 days, THEN 2 tablets (15 mg total) 3 times daily     calcium carbonate (TUMS - DOSED IN MG ELEMENTAL CALCIUM) 500 MG chewable tablet Chew 1,000 mg by mouth daily as needed for indigestion or heartburn.     cephALEXin (KEFLEX) 500 MG capsule Take 1 capsule (500 mg total) by mouth 2 (two) times daily. (Patient not taking:  No sig reported) 10 capsule 0   cyclobenzaprine (FLEXERIL) 5 MG tablet Take 5 mg by mouth 3 (three) times daily as needed for muscle spasms.     empagliflozin (JARDIANCE) 10 MG TABS tablet Take 1 tablet (10 mg total) by mouth daily before breakfast. 30 tablet 11   fluconazole (DIFLUCAN) 150 MG tablet Take one tablet by mouth as a single dose. May repeat in 3 days if symptoms persist. (Patient not taking: No sig reported) 2 tablet 0   insulin lispro (HUMALOG) 100 UNIT/ML KwikPen Inject 2-4 Units into the skin See admin instructions. Inject 2 to 4 units 3 times daily. May inject a 4th dose as needed for high blood sugar     isosorbide mononitrate (IMDUR) 30 MG 24 hr tablet Take 1 tablet (30 mg total) by mouth 2 (two) times daily. 180 tablet  3   Menthol, Topical Analgesic, (BIOFREEZE) 10 % LIQD Apply 1 application topically daily as needed (pain).     metoprolol tartrate (LOPRESSOR) 25 MG tablet Take 0.5 tablets (12.5 mg total) by mouth 2 (two) times daily. 90 tablet 3   nicotine (NICODERM CQ - DOSED IN MG/24 HOURS) 21 mg/24hr patch Place 1 patch (21 mg total) onto the skin daily. 28 patch 0   nitroGLYCERIN (NITROSTAT) 0.4 MG SL tablet Place 1 tablet (0.4 mg total) under the tongue every 5 (five) minutes x 3 doses as needed for chest pain. 25 tablet 2   ondansetron (ZOFRAN-ODT) 4 MG disintegrating tablet Take 1 tablet (4 mg total) by mouth every 8 (eight) hours as needed for nausea or vomiting. 30 tablet 2   pramipexole (MIRAPEX) 0.25 MG tablet Take 0.25 mg by mouth at bedtime.     ticagrelor (BRILINTA) 90 MG TABS tablet Take 1 tablet (90 mg total) by mouth 2 (two) times daily. 180 tablet 2   topiramate (TOPAMAX) 50 MG tablet Take 50 mg by mouth 2 (two) times daily.     TRESIBA FLEXTOUCH 100 UNIT/ML SOPN FlexTouch Pen Inject 40 Units into the skin at bedtime.     No current facility-administered medications for this visit.     Past Surgical History:  Procedure Laterality Date   ABDOMINAL HYSTERECTOMY     ABDOMINAL SURGERY     APPENDECTOMY     BIOPSY  09/05/2017   Procedure: BIOPSY;  Surgeon: [name], MD;  [facility];  Service: Endoscopy;;  random colon   BIOPSY  05/01/2018   Procedure: BIOPSY;  Surgeon: [name], MD;  [facility];  Service: Endoscopy;;  gastric biopsy   CARPAL TUNNEL RELEASE Bilateral    CHOLECYSTECTOMY     COLONOSCOPY WITH PROPOFOL N/A 09/05/2017   Procedure: COLONOSCOPY WITH PROPOFOL;  Surgeon: [name], MD; normal TI, 5 small polyps, diverticulosis in the cecum, internal and external hemorrhoids.  Random colon biopsies benign.  Colon polyps were hyperplastic.  Repeat in 2023.   CORONARY BALLOON ANGIOPLASTY N/A 06/28/2021   Procedure: CORONARY BALLOON ANGIOPLASTY;   Surgeon: Sherren Mocha, MD;  Location: [place] [facility];  Service: Cardiovascular;  Laterality: N/A;   DILATION AND CURETTAGE OF UTERUS     ESOPHAGOGASTRODUODENOSCOPY (EGD) WITH PROPOFOL N/A 05/01/2018   Procedure: ESOPHAGOGASTRODUODENOSCOPY (EGD) WITH PROPOFOL;  Surgeon: [name], MD;  normal esophagus, small hiatal hernia, mild gastritis s/p biopsy, normal duodenum.  No H. pylori.    HERNIA REPAIR     INCISIONAL HERNIA REPAIR N/A 11/06/2020   Procedure: HERNIA REPAIR INCISIONAL, PRIMARY REPAIR;  Surgeon: Virl Cagey, MD;  Location: [facility];  Service: General;  Laterality: N/A;   LEFT HEART CATH AND CORONARY ANGIOGRAPHY N/A 06/28/2021   Procedure: LEFT HEART CATH AND CORONARY ANGIOGRAPHY;  Surgeon: Sherren Mocha, MD;  Location: [place] [facility];  Service: Cardiovascular;  Laterality: N/A;   POLYPECTOMY  09/05/2017   Procedure: POLYPECTOMY;  Surgeon: [name], MD;  [facility];  Service: Endoscopy;;  sigmoid and rectal   TUBAL LIGATION       Allergies  Allergen Reactions   Meprobamate Hives and Swelling   Sinequan [Doxepin Hcl] Anaphylaxis   Tranxene [Clorazepate Dipotassium] Hives and Swelling   Albuterol Other (See Comments)    Patient does not remember the reaction. This was a record provided via [facility]   Haloperidol Other (See Comments)    Patient does not remember the reaction. This was a record provided via [facility]   Sertraline Other (See Comments)    Patient does not remember the reaction. This was a record provided via [facility] Other (See Comments)    Patient does not remember the reaction. This was a record provided via [facility]      Family History  Problem Relation Age of Onset   Diabetes Mother    Stroke Mother    Depression Mother    Colon cancer Father 42       Passed away from colon cancer   Colon cancer Brother 43       Passed away from colon cancer   Bipolar disorder Son       Social History Ms. Grego reports that she has been smoking cigarettes. She has a 40.00 pack-year smoking history. She has never used smokeless tobacco. Ms. Simerly reports no history of alcohol use.   Review of Systems CONSTITUTIONAL: No weight loss, fever, chills, weakness or fatigue.  HEENT: Eyes: No visual loss, blurred vision, double vision or yellow sclerae.No hearing loss, sneezing, congestion, runny nose or sore throat.  SKIN: No rash or itching.  CARDIOVASCULAR: per hpi RESPIRATORY: No shortness of breath, cough or sputum.  GASTROINTESTINAL: No anorexia, nausea, vomiting or diarrhea. No abdominal pain or blood.  GENITOURINARY: No burning on urination, no polyuria NEUROLOGICAL: No headache, dizziness, syncope, paralysis, ataxia, numbness or tingling in the extremities. No change in bowel or bladder control.  MUSCULOSKELETAL: No muscle, back pain, joint pain or stiffness.  LYMPHATICS: No enlarged nodes. No history of splenectomy.  PSYCHIATRIC: No history of depression or anxiety.  ENDOCRINOLOGIC: No reports of sweating, cold or heat intolerance. No polyuria or polydipsia.  Marland Kitchen   Physical Examination Today's Vitals   11/19/21 1526  BP: 120/70  Pulse: 90  SpO2: 98%  Weight: 177 lb 9.6 oz (80.6 kg)  Height: 5' 1.5" (1.562 m)   Body mass index is 33.01 kg/m.  Gen: resting comfortably, no acute distress HEENT: no scleral icterus, pupils equal round and reactive, no palptable cervical adenopathy,  CV: RRR, no m/r/g no jvd Resp: Clear to auscultation bilaterally GI: abdomen is soft, non-tender, non-distended, normal bowel sounds, no hepatosplenomegaly MSK: extremities are warm, no edema.  Skin: warm, no rash Neuro:  no focal deficits Psych: appropriate affect   Diagnostic Studies  05/2021 echo IMPRESSIONS     1. Left ventricular ejection fraction, by estimation, is 60 to 65%. The  left ventricle has normal function. The left ventricle has no regional  wall  motion abnormalities. Left ventricular diastolic parameters are  consistent with Grade  I diastolic  dysfunction (impaired relaxation).   2. Right ventricular systolic function is normal. The right ventricular  size is normal. Tricuspid regurgitation signal is inadequate for assessing  PA pressure.   3. The mitral valve is normal in structure. No evidence of mitral valve  regurgitation. No evidence of mitral stenosis.   4. The aortic valve is tricuspid. Aortic valve regurgitation is not  visualized. Mild aortic valve sclerosis is present, with no evidence of  aortic valve stenosis.   5. The inferior vena cava is normal in size with greater than 50%  respiratory variability, suggesting right atrial pressure of 3 mmHg.   05/2021 cath  Ramus lesion is 95% stenosed.   Balloon angioplasty was performed using a BALLOON SAPPHIRE 2.0X12.   Post intervention, there is a 10% residual stenosis.   1.  Single-vessel coronary artery disease with severe thrombotic stenosis at the bifurcation of the intermediate Jaiquan Temme, treated successfully with balloon angioplasty using a 2 mm balloon.  Intermediate subbranch occlusion noted following angioplasty, likely from distal embolization 2.  Patent left main, LAD, AV circumflex, and RCA without significant stenosis 3.  Normal LV function evaluated by echo with normal LVEDP   Recommend: Aggressive medical therapy, tobacco cessation, dual antiplatelet therapy with aspirin and ticagrelor as tolerated for 12 months for treatment of ACS/non-STEMI  04/2020 carotid US IMPRESSION: 1. Less than 50% stenosis of the right ICA. 2. Normal appearance of the left ICA. 3. Antegrade flow is noted within both vertebral arteries. 4. There are few mildly enlarged left cervical lymph nodes. These are favored to be reactive in etiology. Correlation with physical exam is recommended.  Assessment and Plan   1.CAD - recent chest pain has resolved. Participating in cardiac rehab  without exertional symptoms - increase imdur to 60mg  bid and monitor symptoms  2. Hyperlipidemia - at goal, continue current meds       Arnoldo Lenis, M.D.

## 2021-11-22 ENCOUNTER — Encounter (HOSPITAL_COMMUNITY)
Admission: RE | Admit: 2021-11-22 | Discharge: 2021-11-22 | Disposition: A | Discharge status: Home / Self Care | Payer: Medicare Other | Source: Ambulatory Visit | Attending: Cardiology | Admitting: Cardiology

## 2021-11-22 ENCOUNTER — Other Ambulatory Visit: Payer: Self-pay

## 2021-11-22 DIAGNOSIS — I214 Non-ST elevation (NSTEMI) myocardial infarction: Secondary | ICD-10-CM

## 2021-11-22 DIAGNOSIS — Z9861 Coronary angioplasty status: Secondary | ICD-10-CM

## 2021-11-22 NOTE — Progress Notes (Signed)
Daily Session Note  Patient Details  Name: Katelyn Kelly MRN: [id number] Date of Birth: February 07, 1961 Referring Provider:   Flowsheet Row CARDIAC REHAB PHASE II ORIENTATION from 09/16/2021 in [place]  Referring Provider Dr. Harl Bowie       Encounter Date: 11/22/2021  Check In:  Session Check In - 11/22/21 1445       Check-In   Supervising physician immediately available to respond to emergencies [facility] MD immediately available    Physician(s) Dr. Radford Pax    Location [facility]    Staff Present Hoy Register, MS, ACSM-CEP, Exercise Physiologist;Other    Virtual Visit No    Medication changes reported     No    Fall or balance concerns reported    No    Tobacco Cessation Use Increase    Current number of cigarettes/nicotine per day     25    Warm-up and Cool-down Performed as group-led instruction    Resistance Training Performed Yes    VAD Patient? No    PAD/SET Patient? No      Pain Assessment   Currently in Pain? No/denies    Pain Score 0-No pain    Multiple Pain Sites No             Capillary Blood Glucose: No results found for this or any previous visit (from the past 24 hour(s)).    Social History   Tobacco Use  Smoking Status Every Day   Packs/day: 1.00   Years: 40.00   Pack years: 40.00   Types: Cigarettes  Smokeless Tobacco Never  Tobacco Comments   one pack a day. Pt has purchased nicotine patches. She has a plan to stop and is going to set a quit date.     Goals Met:  Independence with exercise equipment Exercise tolerated well No report of concerns or symptoms today Strength training completed today  Goals Unmet:  Not Applicable  Comments: checkout time of 1545   Dr. Kathie Dike is Medical Director for [facility].

## 2021-11-24 ENCOUNTER — Encounter (HOSPITAL_COMMUNITY)
Admission: RE | Admit: 2021-11-24 | Discharge: 2021-11-24 | Disposition: A | Discharge status: Home / Self Care | Payer: Medicare Other | Source: Ambulatory Visit | Attending: Cardiology | Admitting: Cardiology

## 2021-11-24 ENCOUNTER — Other Ambulatory Visit: Payer: Self-pay

## 2021-11-24 ENCOUNTER — Ambulatory Visit
Admission: RE | Admit: 2021-11-24 | Discharge: 2021-11-24 | Disposition: A | Discharge status: Home / Self Care | Payer: Commercial Managed Care - HMO | Source: Ambulatory Visit | Attending: Neurosurgery | Admitting: Neurosurgery

## 2021-11-24 DIAGNOSIS — M4722 Other spondylosis with radiculopathy, cervical region: Secondary | ICD-10-CM

## 2021-11-24 DIAGNOSIS — Z9861 Coronary angioplasty status: Secondary | ICD-10-CM | POA: Diagnosis not present

## 2021-11-24 NOTE — Progress Notes (Signed)
Daily Session Note  Patient Details  Name: Katelyn Kelly MRN: [id number] Date of Birth: Sep 11, 1961 Referring Provider:   Flowsheet Row CARDIAC REHAB PHASE II ORIENTATION from 09/16/2021 in [place]  Referring Provider Dr. Harl Bowie       Encounter Date: 11/24/2021  Check In:  Session Check In - 11/24/21 1442       Check-In   Supervising physician immediately available to respond to emergencies [facility] MD immediately available    Physician(s) Dr. Domenic Polite    Location [facility]    Staff Present Redge Gainer, BS, Exercise Physiologist;Other    Virtual Visit No    Medication changes reported     No    Fall or balance concerns reported    No    Tobacco Cessation No Change    Current number of cigarettes/nicotine per day     25    Warm-up and Cool-down Performed as group-led instruction    Resistance Training Performed Yes    VAD Patient? No    PAD/SET Patient? No      Pain Assessment   Currently in Pain? No/denies    Pain Score 0-No pain    Multiple Pain Sites No             Capillary Blood Glucose: No results found for this or any previous visit (from the past 24 hour(s)).    Social History   Tobacco Use  Smoking Status Every Day   Packs/day: 1.00   Years: 40.00   Pack years: 40.00   Types: Cigarettes  Smokeless Tobacco Never  Tobacco Comments   one pack a day. Pt has purchased nicotine patches. She has a plan to stop and is going to set a quit date.     Goals Met:  Independence with exercise equipment Exercise tolerated well No report of concerns or symptoms today Strength training completed today  Goals Unmet:  Not Applicable  Comments: check out 1545   Dr. Kathie Dike is Medical Director for [facility].

## 2021-11-26 ENCOUNTER — Encounter (HOSPITAL_COMMUNITY): Payer: Medicare Other

## 2021-11-29 ENCOUNTER — Encounter (HOSPITAL_COMMUNITY)
Admission: RE | Admit: 2021-11-29 | Discharge: 2021-11-29 | Disposition: A | Discharge status: Home / Self Care | Payer: Medicare Other | Source: Ambulatory Visit | Attending: Cardiology | Admitting: Cardiology

## 2021-11-29 VITALS — Wt 178.4 lb

## 2021-11-29 DIAGNOSIS — Z9861 Coronary angioplasty status: Secondary | ICD-10-CM | POA: Diagnosis not present

## 2021-11-29 DIAGNOSIS — I214 Non-ST elevation (NSTEMI) myocardial infarction: Secondary | ICD-10-CM

## 2021-11-29 NOTE — Progress Notes (Signed)
Daily Session Note  Patient Details  Name: Katelyn Kelly MRN: [id number] Date of Birth: 04-08-1961 Referring Provider:   Flowsheet Row CARDIAC REHAB PHASE II ORIENTATION from 09/16/2021 in [place]  Referring Provider Dr. Harl Bowie       Encounter Date: 11/29/2021  Check In:  Session Check In - 11/29/21 1445       Check-In   Supervising physician immediately available to respond to emergencies [facility] MD immediately available    Physician(s) Dr. Johney Frame    Location [facility]    Staff Present Geanie Cooley, RN;Dalton Kris Mouton, MS, ACSM-CEP, Exercise Physiologist;Debra Wynetta Emery, RN, BSN    Virtual Visit No    Medication changes reported     No    Fall or balance concerns reported    No    Tobacco Cessation No Change    Current number of cigarettes/nicotine per day     25    Warm-up and Cool-down Performed as group-led instruction    Resistance Training Performed Yes    VAD Patient? No    PAD/SET Patient? No      Pain Assessment   Currently in Pain? No/denies    Pain Score 0-No pain    Multiple Pain Sites No             Capillary Blood Glucose: No results found for this or any previous visit (from the past 24 hour(s)).    Social History   Tobacco Use  Smoking Status Every Day   Packs/day: 1.00   Years: 40.00   Pack years: 40.00   Types: Cigarettes  Smokeless Tobacco Never  Tobacco Comments   one pack a day. Pt has purchased nicotine patches. She has a plan to stop and is going to set a quit date.     Goals Met:  Independence with exercise equipment Exercise tolerated well No report of concerns or symptoms today Strength training completed today  Goals Unmet:  Not Applicable  Comments: check out @ 3:45pm   Dr. Kathie Dike is Medical Director for [facility].

## 2021-12-01 ENCOUNTER — Encounter (HOSPITAL_COMMUNITY)
Admission: RE | Admit: 2021-12-01 | Discharge: 2021-12-01 | Disposition: A | Discharge status: Home / Self Care | Payer: Medicare Other | Source: Ambulatory Visit | Attending: Cardiology | Admitting: Cardiology

## 2021-12-01 ENCOUNTER — Telehealth: Payer: Self-pay | Admitting: Cardiology

## 2021-12-01 DIAGNOSIS — I214 Non-ST elevation (NSTEMI) myocardial infarction: Secondary | ICD-10-CM | POA: Diagnosis present

## 2021-12-01 DIAGNOSIS — Z9861 Coronary angioplasty status: Secondary | ICD-10-CM | POA: Diagnosis not present

## 2021-12-01 MED ORDER — ISOSORBIDE MONONITRATE ER 60 MG PO TB24: 60.0000 mg | ORAL_TABLET | Freq: Two times a day (BID) | ORAL | 3 refills | Status: DC

## 2021-12-01 NOTE — Telephone Encounter (Signed)
Patient walked in the office stating that [place] they have not received RX for her isosorbide mononitrate (IMDUR) 60 MG 24 hr tablet.

## 2021-12-01 NOTE — Progress Notes (Signed)
Daily Session Note  Patient Details  Name: RHYA SHAN MRN: [id number] Date of Birth: 1960-11-12 Referring Provider:   Flowsheet Row CARDIAC REHAB PHASE II ORIENTATION from 09/16/2021 in [place]  Referring Provider Dr. Harl Bowie       Encounter Date: 12/01/2021  Check In:  Session Check In - 12/01/21 1443       Check-In   Supervising physician immediately available to respond to emergencies [facility] MD immediately available    Physician(s) Dr. Harl Bowie    Location [facility]    Staff Present Redge Gainer, BS, Exercise Physiologist;Dalton Kris Mouton, MS, ACSM-CEP, Exercise Physiologist;Other    Virtual Visit No    Medication changes reported     No    Fall or balance concerns reported    No    Tobacco Cessation No Change    Warm-up and Cool-down Performed as group-led instruction    Resistance Training Performed Yes    VAD Patient? No    PAD/SET Patient? No      Pain Assessment   Currently in Pain? No/denies    Pain Score 0-No pain    Multiple Pain Sites No             Capillary Blood Glucose: No results found for this or any previous visit (from the past 24 hour(s)).    Social History   Tobacco Use  Smoking Status Every Day   Packs/day: 1.00   Years: 40.00   Pack years: 40.00   Types: Cigarettes  Smokeless Tobacco Never  Tobacco Comments   one pack a day. Pt has purchased nicotine patches. She has a plan to stop and is going to set a quit date.     Goals Met:  Independence with exercise equipment Exercise tolerated well No report of concerns or symptoms today Strength training completed today  Goals Unmet:  Not Applicable  Comments: check out 1545   Dr. Kathie Dike is Medical Director for [facility].

## 2021-12-01 NOTE — Progress Notes (Signed)
Cardiac Individual Treatment Plan  Patient Details  Name: Katelyn Kelly MRN: [id number] Date of Birth: 09-12-1961 Referring Provider:   Flowsheet Row CARDIAC REHAB PHASE II ORIENTATION from 09/16/2021 in [facility]  Referring Provider Dr. Harl Bowie       Initial Encounter Date:  Flowsheet Row CARDIAC REHAB PHASE II ORIENTATION from 09/16/2021 in [facility]  Date 09/16/21       Visit Diagnosis: S/P PTCA (percutaneous transluminal coronary angioplasty)  NSTEMI (non-ST elevated myocardial infarction) ([place])  Patient's Home Medications on Admission:  Current Outpatient Medications:    acetaminophen (TYLENOL) 650 MG CR tablet, Take 1,300 mg by mouth every 8 (eight) hours as needed for pain., Disp: , Rfl:    ALPRAZolam (XANAX) 0.5 MG tablet, Take 1 tablet (0.5 mg total) by mouth in the morning, at noon, in the evening, and at bedtime. (Patient taking differently: Take 0.5 mg by mouth 3 (three) times daily.), Disp: , Rfl:    aspirin 81 MG EC tablet, Take 1 tablet (81 mg total) by mouth daily. Swallow whole. (Patient taking differently: Take 81 mg by mouth at bedtime. Swallow whole.), Disp: 90 tablet, Rfl: 2   atorvastatin (LIPITOR) 80 MG tablet, Take 1 tablet (80 mg total) by mouth at bedtime., Disp: 90 tablet, Rfl: 1   busPIRone (BUSPAR) 7.5 MG tablet, Take by mouth See admin instructions. Take 0.5 tablets (3.75 mg total) by mouth 3 times daily for 7 days, THEN 1 tablet (7.5 mg total) 3 times daily for 7 days, THEN 2 tablets (15 mg total) 3 times daily, Disp: , Rfl:    calcium carbonate (TUMS - DOSED IN MG ELEMENTAL CALCIUM) 500 MG chewable tablet, Chew 1,000 mg by mouth daily as needed for indigestion or heartburn., Disp: , Rfl:    cyclobenzaprine (FLEXERIL) 5 MG tablet, Take 5 mg by mouth 3 (three) times daily as needed for muscle spasms., Disp: , Rfl:    insulin lispro (HUMALOG) 100 UNIT/ML KwikPen, Inject 2-4 Units into the skin See admin  instructions. Inject 2 to 4 units 3 times daily. May inject a 4th dose as needed for high blood sugar, Disp: , Rfl:    isosorbide mononitrate (IMDUR) 60 MG 24 hr tablet, Take 1 tablet (60 mg total) by mouth in the morning and at bedtime., Disp: 180 tablet, Rfl: 3   Menthol, Topical Analgesic, (BIOFREEZE) 10 % LIQD, Apply 1 application topically daily as needed (pain)., Disp: , Rfl:    metoprolol tartrate (LOPRESSOR) 25 MG tablet, Take 0.5 tablets (12.5 mg total) by mouth 2 (two) times daily., Disp: 90 tablet, Rfl: 3   nicotine (NICODERM CQ - DOSED IN MG/24 HOURS) 21 mg/24hr patch, Place 1 patch (21 mg total) onto the skin daily., Disp: 28 patch, Rfl: 0   nitroGLYCERIN (NITROSTAT) 0.4 MG SL tablet, Place 1 tablet (0.4 mg total) under the tongue every 5 (five) minutes x 3 doses as needed for chest pain., Disp: 25 tablet, Rfl: 2   ondansetron (ZOFRAN-ODT) 4 MG disintegrating tablet, Take 1 tablet (4 mg total) by mouth every 8 (eight) hours as needed for nausea or vomiting., Disp: 30 tablet, Rfl: 2   pramipexole (MIRAPEX) 0.25 MG tablet, Take 0.25 mg by mouth at bedtime., Disp: , Rfl:    ticagrelor (BRILINTA) 90 MG TABS tablet, Take 1 tablet (90 mg total) by mouth 2 (two) times daily., Disp: 180 tablet, Rfl: 2   topiramate (TOPAMAX) 50 MG tablet, Take 50 mg by mouth 2 (two) times daily.,  Disp: , Rfl:    TRESIBA FLEXTOUCH 100 UNIT/ML SOPN FlexTouch Pen, Inject 46 Units into the skin at bedtime., Disp: , Rfl:   Past Medical History: Past Medical History:  Diagnosis Date   Anxiety and depression    Bilateral carpal tunnel syndrome    Chronic back pain    Chronic neck pain    Depression    Diabetes mellitus    GERD (gastroesophageal reflux disease)    H/O syncope    Headache    History of panic attacks    Hypertension    IBS (irritable bowel syndrome)    Manic depression (HCC)    Migraine    Panic attack     Tobacco Use: Social History   Tobacco Use  Smoking Status Every Day   Packs/day:  1.00   Years: 40.00   Pack years: 40.00   Types: Cigarettes  Smokeless Tobacco Never  Tobacco Comments   one pack a day. Pt has purchased nicotine patches. She has a plan to stop and is going to set a quit date.     Labs: Recent Review Flowsheet Data     Labs for ITP Cardiac and Pulmonary Rehab Latest Ref Rng & Units 10/21/2011 08/29/2017 10/13/2018 06/26/2021 11/15/2021   Cholestrol 0 - 200 mg/dL - - - 193 122   LDLCALC 0 - 99 mg/dL - - - 105(H) 44   HDL >40 mg/dL - - - 56 53   Trlycerides <150 mg/dL - - - 158(H) 125   Hemoglobin A1c 4.8 - 5.6 % - 6.7(H) - 10.4(H) -   TCO2 22 - 32 mmol/L 28 - 27 - -       Capillary Blood Glucose: Lab Results  Component Value Date   GLUCAP 196 (H) 09/20/2021   GLUCAP 200 (H) 09/16/2021   GLUCAP 277 (H) 06/29/2021   GLUCAP 188 (H) 06/29/2021   GLUCAP 131 (H) 06/28/2021    POCT Glucose     Row Name 09/16/21 1448             POCT Blood Glucose   Pre-Exercise 200 mg/dL                Exercise Target Goals: Exercise Program Goal: Individual exercise prescription set using results from initial 6 min walk test and THRR while considering  patients activity barriers and safety.   Exercise Prescription Goal: Starting with aerobic activity 30 plus minutes a day, 3 days per week for initial exercise prescription. Provide home exercise prescription and guidelines that participant acknowledges understanding prior to discharge.  Activity Barriers & Risk Stratification:  Activity Barriers & Cardiac Risk Stratification - 09/16/21 1317       Activity Barriers & Cardiac Risk Stratification   Activity Barriers Back Problems;Deconditioning;Shortness of Breath;Chest Pain/Angina    Cardiac Risk Stratification High             6 Minute Walk:  6 Minute Walk     Row Name 09/16/21 1419 09/16/21 1441       6 Minute Walk   Phase Initial (P)  Initial    Distance 1000 feet (P)  1000 feet    Walk Time 6 minutes (P)  6 minutes    # of  Rest Breaks 0 (P)  0    MPH 1.9 (P)  1.9    METS 2.6 (P)  2.6    RPE 12 (P)  12    VO2 Peak 9.11 (P)  9.11    Symptoms No (P)  No    Resting HR -- 81 bpm    Resting BP -- 124/70    Resting Oxygen Saturation  -- 98 %    Exercise Oxygen Saturation  during 6 min walk -- 98 %    Max Ex. HR -- 110 bpm    Max Ex. BP -- 140/75    2 Minute Post BP -- 128/70             Oxygen Initial Assessment:   Oxygen Re-Evaluation:   Oxygen Discharge (Final Oxygen Re-Evaluation):   Initial Exercise Prescription:  Initial Exercise Prescription - 09/16/21 1400       Date of Initial Exercise RX and Referring Provider   Date 09/16/21    Referring Provider Dr. Harl Bowie    Expected Discharge Date 12/10/21      NuStep   Level 1    SPM 60    Minutes 39      Prescription Details   Frequency (times per week) 3    Duration Progress to 30 minutes of continuous aerobic without signs/symptoms of physical distress      Intensity   THRR 40-80% of Max Heartrate 63-126    Ratings of Perceived Exertion 11-13    Perceived Dyspnea 0-4      Resistance Training   Training Prescription Yes    Weight 3    Reps 10-15             Perform Capillary Blood Glucose checks as needed.  Exercise Prescription Changes:   Exercise Prescription Changes     Row Name 09/20/21 1544 10/04/21 1534 10/18/21 1445 10/27/21 1500 10/29/21 1445     Response to Exercise   Blood Pressure (Admit) 115/60 102/60 100/60 -- 125/60   Blood Pressure (Exercise) 102/56 130/62 135/70 -- 118/72   Blood Pressure (Exit) 108/62 96/58 110/65 -- 122/72   Heart Rate (Admit) 100 bpm 78 bpm 98 bpm -- 80 bpm   Heart Rate (Exercise) 120 bpm 100 bpm 105 bpm -- 106 bpm   Heart Rate (Exit) 109 bpm 87 bpm 104 bpm -- 89 bpm   Rating of Perceived Exertion (Exercise) 13 12 11  -- 11   Duration Continue with 30 min of aerobic exercise without signs/symptoms of physical distress. Continue with 30 min of aerobic exercise without  signs/symptoms of physical distress. Continue with 30 min of aerobic exercise without signs/symptoms of physical distress. -- Continue with 30 min of aerobic exercise without signs/symptoms of physical distress.   Intensity THRR unchanged THRR unchanged THRR unchanged -- THRR unchanged     Progression   Progression Continue to progress workloads to maintain intensity without signs/symptoms of physical distress. Continue to progress workloads to maintain intensity without signs/symptoms of physical distress. -- -- Continue to progress workloads to maintain intensity without signs/symptoms of physical distress.     Resistance Training   Training Prescription Yes Yes Yes -- Yes   Weight 3 3 3  -- 3   Reps 10-15 10-15 10-15 -- 10-15   Time 103 Minutes 10 Minutes 10 Minutes -- 10 Minutes     NuStep   Level 1 2 2  -- 1   SPM 96 103 116 -- 142   Minutes 36 39 39 -- 39   METs 1.79 2.01 2.36 -- 2.16     Home Exercise Plan   Plans to continue exercise at -- -- -- Home (comment) --   Frequency -- -- -- Add 2 additional days to program exercise sessions. --   Initial  Home Exercises Provided -- -- -- 10/27/21 --    [place] Name 11/15/21 1500 11/29/21 1445           Response to Exercise   Blood Pressure (Admit) 115/60 98/58      Blood Pressure (Exercise) 126/60 132/62      Blood Pressure (Exit) 102/64 110/60      Heart Rate (Admit) 72 bpm 93 bpm      Heart Rate (Exercise) 103 bpm 111 bpm      Heart Rate (Exit) 81 bpm 102 bpm      Rating of Perceived Exertion (Exercise) 11 11      Duration Continue with 30 min of aerobic exercise without signs/symptoms of physical distress. Continue with 30 min of aerobic exercise without signs/symptoms of physical distress.      Intensity THRR unchanged THRR unchanged        Progression   Progression Continue to progress workloads to maintain intensity without signs/symptoms of physical distress. Continue to progress workloads to maintain intensity without  signs/symptoms of physical distress.        Resistance Training   Training Prescription Yes Yes      Weight 3 3      Reps 10-15 10-15      Time 10 Minutes 10 Minutes        NuStep   Level 2 3      SPM 156 138      Minutes 39 39      METs 2.38 1.9               Exercise Comments:   Exercise Comments     Row Name 10/27/21 1502           Exercise Comments home exercised reviewed                Exercise Goals and Review:   Exercise Goals     Row Name 09/16/21 1445 10/05/21 1015 11/02/21 1352 11/30/21 1258       Exercise Goals   Increase Physical Activity Yes Yes Yes Yes    Intervention Provide advice, education, support and counseling about physical activity/exercise needs.;Develop an individualized exercise prescription for aerobic and resistive training based on initial evaluation findings, risk stratification, comorbidities and participant's personal goals. Provide advice, education, support and counseling about physical activity/exercise needs.;Develop an individualized exercise prescription for aerobic and resistive training based on initial evaluation findings, risk stratification, comorbidities and participant's personal goals. Provide advice, education, support and counseling about physical activity/exercise needs.;Develop an individualized exercise prescription for aerobic and resistive training based on initial evaluation findings, risk stratification, comorbidities and participant's personal goals. Provide advice, education, support and counseling about physical activity/exercise needs.;Develop an individualized exercise prescription for aerobic and resistive training based on initial evaluation findings, risk stratification, comorbidities and participant's personal goals.    Expected Outcomes Short Term: Attend rehab on a regular basis to increase amount of physical activity.;Long Term: Add in home exercise to make exercise part of routine and to increase amount  of physical activity.;Long Term: Exercising regularly at least 3-5 days a week. Short Term: Attend rehab on a regular basis to increase amount of physical activity.;Long Term: Add in home exercise to make exercise part of routine and to increase amount of physical activity.;Long Term: Exercising regularly at least 3-5 days a week. Short Term: Attend rehab on a regular basis to increase amount of physical activity.;Long Term: Add in home exercise to make exercise part of routine and to increase  amount of physical activity.;Long Term: Exercising regularly at least 3-5 days a week. Short Term: Attend rehab on a regular basis to increase amount of physical activity.;Long Term: Add in home exercise to make exercise part of routine and to increase amount of physical activity.;Long Term: Exercising regularly at least 3-5 days a week.    Increase Strength and Stamina Yes Yes Yes Yes    Intervention Provide advice, education, support and counseling about physical activity/exercise needs.;Develop an individualized exercise prescription for aerobic and resistive training based on initial evaluation findings, risk stratification, comorbidities and participant's personal goals. Provide advice, education, support and counseling about physical activity/exercise needs.;Develop an individualized exercise prescription for aerobic and resistive training based on initial evaluation findings, risk stratification, comorbidities and participant's personal goals. Provide advice, education, support and counseling about physical activity/exercise needs.;Develop an individualized exercise prescription for aerobic and resistive training based on initial evaluation findings, risk stratification, comorbidities and participant's personal goals. Provide advice, education, support and counseling about physical activity/exercise needs.;Develop an individualized exercise prescription for aerobic and resistive training based on initial evaluation  findings, risk stratification, comorbidities and participant's personal goals.    Expected Outcomes Short Term: Increase workloads from initial exercise prescription for resistance, speed, and METs.;Short Term: Perform resistance training exercises routinely during rehab and add in resistance training at home;Long Term: Improve cardiorespiratory fitness, muscular endurance and strength as measured by increased METs and functional capacity (6MWT) Short Term: Increase workloads from initial exercise prescription for resistance, speed, and METs.;Short Term: Perform resistance training exercises routinely during rehab and add in resistance training at home;Long Term: Improve cardiorespiratory fitness, muscular endurance and strength as measured by increased METs and functional capacity (6MWT) Short Term: Increase workloads from initial exercise prescription for resistance, speed, and METs.;Short Term: Perform resistance training exercises routinely during rehab and add in resistance training at home;Long Term: Improve cardiorespiratory fitness, muscular endurance and strength as measured by increased METs and functional capacity (6MWT) Short Term: Increase workloads from initial exercise prescription for resistance, speed, and METs.;Short Term: Perform resistance training exercises routinely during rehab and add in resistance training at home;Long Term: Improve cardiorespiratory fitness, muscular endurance and strength as measured by increased METs and functional capacity (6MWT)    Able to understand and use rate of perceived exertion (RPE) scale Yes Yes Yes Yes    Intervention Provide education and explanation on how to use RPE scale Provide education and explanation on how to use RPE scale Provide education and explanation on how to use RPE scale Provide education and explanation on how to use RPE scale    Expected Outcomes Short Term: Able to use RPE daily in rehab to express subjective intensity level;Long Term:   Able to use RPE to guide intensity level when exercising independently Short Term: Able to use RPE daily in rehab to express subjective intensity level;Long Term:  Able to use RPE to guide intensity level when exercising independently Short Term: Able to use RPE daily in rehab to express subjective intensity level;Long Term:  Able to use RPE to guide intensity level when exercising independently Short Term: Able to use RPE daily in rehab to express subjective intensity level;Long Term:  Able to use RPE to guide intensity level when exercising independently    Knowledge and understanding of Target Heart Rate Range (THRR) Yes Yes Yes Yes    Intervention Provide education and explanation of THRR including how the numbers were predicted and where they are located for reference Provide education and explanation of THRR  including how the numbers were predicted and where they are located for reference Provide education and explanation of THRR including how the numbers were predicted and where they are located for reference Provide education and explanation of THRR including how the numbers were predicted and where they are located for reference    Expected Outcomes Short Term: Able to state/look up THRR;Short Term: Able to use daily as guideline for intensity in rehab;Long Term: Able to use THRR to govern intensity when exercising independently Short Term: Able to state/look up THRR;Short Term: Able to use daily as guideline for intensity in rehab;Long Term: Able to use THRR to govern intensity when exercising independently Short Term: Able to state/look up THRR;Short Term: Able to use daily as guideline for intensity in rehab;Long Term: Able to use THRR to govern intensity when exercising independently Short Term: Able to state/look up THRR;Short Term: Able to use daily as guideline for intensity in rehab;Long Term: Able to use THRR to govern intensity when exercising independently    Able to check pulse  independently Yes Yes Yes Yes    Intervention Provide education and demonstration on how to check pulse in carotid and radial arteries.;Review the importance of being able to check your own pulse for safety during independent exercise Provide education and demonstration on how to check pulse in carotid and radial arteries.;Review the importance of being able to check your own pulse for safety during independent exercise Provide education and demonstration on how to check pulse in carotid and radial arteries.;Review the importance of being able to check your own pulse for safety during independent exercise Provide education and demonstration on how to check pulse in carotid and radial arteries.;Review the importance of being able to check your own pulse for safety during independent exercise    Expected Outcomes Short Term: Able to explain why pulse checking is important during independent exercise;Long Term: Able to check pulse independently and accurately Short Term: Able to explain why pulse checking is important during independent exercise;Long Term: Able to check pulse independently and accurately Short Term: Able to explain why pulse checking is important during independent exercise;Long Term: Able to check pulse independently and accurately Short Term: Able to explain why pulse checking is important during independent exercise;Long Term: Able to check pulse independently and accurately    Understanding of Exercise Prescription Yes Yes Yes Yes    Intervention Provide education, explanation, and written materials on patient's individual exercise prescription Provide education, explanation, and written materials on patient's individual exercise prescription Provide education, explanation, and written materials on patient's individual exercise prescription Provide education, explanation, and written materials on patient's individual exercise prescription    Expected Outcomes Short Term: Able to explain  program exercise prescription;Long Term: Able to explain home exercise prescription to exercise independently Short Term: Able to explain program exercise prescription;Long Term: Able to explain home exercise prescription to exercise independently Short Term: Able to explain program exercise prescription;Long Term: Able to explain home exercise prescription to exercise independently Short Term: Able to explain program exercise prescription;Long Term: Able to explain home exercise prescription to exercise independently             Exercise Goals Re-Evaluation :  Exercise Goals Re-Evaluation     Row Name 10/04/21 1535 11/02/21 1353 11/30/21 1258         Exercise Goal Re-Evaluation   Exercise Goals Review Increase Physical Activity;Increase Strength and Stamina;Able to understand and use rate of perceived exertion (RPE) scale;Knowledge and understanding of Target Heart  Rate Range (THRR);Able to check pulse independently;Understanding of Exercise Prescription Increase Physical Activity;Increase Strength and Stamina;Able to understand and use rate of perceived exertion (RPE) scale;Knowledge and understanding of Target Heart Rate Range (THRR);Able to check pulse independently;Understanding of Exercise Prescription Increase Physical Activity;Increase Strength and Stamina;Able to understand and use rate of perceived exertion (RPE) scale;Knowledge and understanding of Target Heart Rate Range (THRR);Able to check pulse independently;Understanding of Exercise Prescription     Comments Pt has completed 6 sessions of cardiac rehab. She seems to be motivated to improve her health and has progressed her workload during class. She is currently working at 2.09 METs in the stepper. Will continue to monitor and progress as able. Pt has completed 15 sessions of cardiac rehab. She has been advised by a vascular surgeon due to her carotid artery blockage to back off from level 2 to 1 on the stepper. She still continues  to smokes. She is currently exercising at 2.16 METs on the stepper. Will continue to monitor and progress as able. Pt has completed 24 sessions of cardiac rehab. She has been able to progress her workloads and is motivated in class. She has been having back pain and has been referred to PT for this. She continues to smoke. She is currently exercising at 1.9 METs. Will continue to monitor and progress as able.     Expected Outcomes Through exercise at home and at rehab, the patient will meet their stated goals. Through exercise at home and at rehab, the patient will meet their stated goals. Through exercise at home and at rehab, the patient will meet their stated goals.               Discharge Exercise Prescription (Final Exercise Prescription Changes):  Exercise Prescription Changes - 11/29/21 1445       Response to Exercise   Blood Pressure (Admit) 98/58    Blood Pressure (Exercise) 132/62    Blood Pressure (Exit) 110/60    Heart Rate (Admit) 93 bpm    Heart Rate (Exercise) 111 bpm    Heart Rate (Exit) 102 bpm    Rating of Perceived Exertion (Exercise) 11    Duration Continue with 30 min of aerobic exercise without signs/symptoms of physical distress.    Intensity THRR unchanged      Progression   Progression Continue to progress workloads to maintain intensity without signs/symptoms of physical distress.      Resistance Training   Training Prescription Yes    Weight 3    Reps 10-15    Time 10 Minutes      NuStep   Level 3    SPM 138    Minutes 39    METs 1.9             Nutrition:  Target Goals: Understanding of nutrition guidelines, daily intake of sodium 1500mg , cholesterol 200mg , calories 30% from fat and 7% or less from saturated fats, daily to have 5 or more servings of fruits and vegetables.  Biometrics:  Pre Biometrics - 09/16/21 1440       Pre Biometrics   Height 5' 1.5" (1.562 m)    Weight 80.8 kg    Waist Circumference 42.5 inches    Hip  Circumference 45 inches    Waist to Hip Ratio 0.94 %    BMI (Calculated) 33.12    Triceps Skinfold 19 mm    % Body Fat 42.6 %    Grip Strength 20 kg    Flexibility 12 in  Single Leg Stand 12 seconds              Nutrition Therapy Plan and Nutrition Goals:  Nutrition Therapy & Goals - 11/22/21 0820       Personal Nutrition Goals   Comments Patient scored 87 on her diet assessment. We offer 2 educational sessions on heart healthy nutrition with handouts and assistance with RD referral if patient is interested.      Intervention Plan   Intervention Nutrition handout(s) given to patient.    Expected Outcomes Short Term Goal: Understand basic principles of dietary content, such as calories, fat, sodium, cholesterol and nutrients.             Nutrition Assessments:  Nutrition Assessments - 09/16/21 1323       MEDFICTS Scores   Pre Score 87            MEDIFICTS Score Key: ?70 Need to make dietary changes  40-70 Heart Healthy Diet ? 40 Therapeutic Level Cholesterol Diet   Picture Your Plate Scores: <22 Unhealthy dietary pattern with much room for improvement. 41-50 Dietary pattern unlikely to meet recommendations for good health and room for improvement. 51-60 More healthful dietary pattern, with some room for improvement.  >60 Healthy dietary pattern, although there may be some specific behaviors that could be improved.    Nutrition Goals Re-Evaluation:   Nutrition Goals Discharge (Final Nutrition Goals Re-Evaluation):   Psychosocial: Target Goals: Acknowledge presence or absence of significant depression and/or stress, maximize coping skills, provide positive support system. Participant is able to verbalize types and ability to use techniques and skills needed for reducing stress and depression.  Initial Review & Psychosocial Screening:  Initial Psych Review & Screening - 09/27/21 0827       Initial Review   Source of Stress Concerns --    Comments  --             Quality of Life Scores:  Quality of Life - 09/16/21 1446       Quality of Life   Select Quality of Life      Quality of Life Scores   Health/Function Pre 17.45 %    Socioeconomic Pre 18.5 %    Psych/Spiritual Pre 12.5 %    Family Pre 30 %    GLOBAL Pre 19.22 %            Scores of 19 and below usually indicate a poorer quality of life in these areas.  A difference of  2-3 points is a clinically meaningful difference.  A difference of 2-3 points in the total score of the Quality of Life Index has been associated with significant improvement in overall quality of life, self-image, physical symptoms, and general health in studies assessing change in quality of life.  PHQ-9: Recent Review Flowsheet Data     Depression screen [facility] 2/9 09/16/2021   Decreased Interest 3   Down, Depressed, Hopeless 3   PHQ - 2 Score 6   Altered sleeping 2   Tired, decreased energy 3   Change in appetite 3    Feeling bad or failure about yourself  3   Trouble concentrating 2   Moving slowly or fidgety/restless 0   Suicidal thoughts 0   PHQ-9 Score 19   Difficult doing work/chores Very difficult      Interpretation of Total Score  Total Score Depression Severity:  1-4 = Minimal depression, 5-9 = Mild depression, 10-14 = Moderate depression, 15-19 = Moderately severe depression,  20-27 = Severe depression   Psychosocial Evaluation and Intervention:  Psychosocial Evaluation - 09/16/21 1437       Psychosocial Evaluation & Interventions   Interventions Stress management education;Relaxation education;Encouraged to exercise with the program and follow exercise prescription    Comments Pt's transportation could be a barrier to participation in rehab. She reports that her car is unreliable but it should be okay to come to rehab. She has several identfiable psychosocial issues. She has anxiety and depression which is treated with xanax and buspar. She reports that her PCP recently  cut her xanax prescription in half and that she has felt more depressed since this. She was seeing a psychiatrist in [place] but she reports that she had a panic attack during an appointment and that the psychiatrist will no longer see her. She has been referred to another psychiatrist in [place] but states that this is too far for her to drive. She also states that [facility] would be too far to drive as well. She scored a 19 on her PHQ-9. She reports little interest in doing things and feeling depressed almost everyday. Her only support system is one of her friend. She has several sources of stress. Her primary source is her health. Along with her heart issues, she also has poorly controlled DM and she has recently been referred to pulmonlogy due to a suspected pulmonary conditon. Her living situation causes her stress. Her son and brother both live with her. She reports that her son is a recovering addict and currently does not have a job to help pay bills. She reports that her brother has early stage dementia and also has terrible personal hygiene. She is hopeful that once her son gets a job that he can move out. She is currently smoking about a pack of cigarettes per day. She has cut back from three packs per day before her NSTEMI. She has a plan to stop. She just bought nicotine patches and will call 1800quitnow if she needs assistance. She is going to pick a quit date soon. She reports that both her brother and son smoke cigarettes in her house which could be trigger for her to relapse. She reports that she will tell them that it is her house and if they want to live there they will have to smoke outside. She in confident in her plan. She is very nervous about starting the program but she does have a positive outlook that it will help her.    Expected Outcomes Pt's anxiety and depression will be better contolled through medication and patient will see a psychiatrist. Her stressors will be reduced or  eliminated. She will follow through with her smoking cessation plan.    Continue Psychosocial Services  Follow up required by staff             Psychosocial Re-Evaluation:  Psychosocial Re-Evaluation     [place] Name 09/27/21 0827 10/21/21 0809 11/22/21 3329         Psychosocial Re-Evaluation   Current issues with Current Depression;Current Anxiety/Panic;Current Stress Concerns Current Depression;Current Anxiety/Panic;Current Stress Concerns Current Depression;Current Anxiety/Panic;Current Stress Concerns     Comments Patient is new to the program completing 1 sessions. She continues on Alprazolam and Buspar to manage her depression and anxiety. We will continue to monitor her progress and management of psychosocial issures. Patient has completed 11 sessions. Her pcp manages her psychosocial issues of depression and anxiety. She saw him 12/6 and decreased her Buspar and she conitnues on Alprazolam.  He feels her depressions and anxiety are improved stating she was not as tearful during the visit. Patient feels her psychosocial issues are managed. She seems to enjoy coming to the program and demonstrates an interest in improving her health. We will continue to monitor. Patient has completed 20 sessions. Her pcp continues to manage her psychosocial issues of depression and anxiety. She continues taking Buspar and Alprazolam and  feels her psychosocial issues are managed. She seems to enjoy coming to the program and demonstrates an interest in improving her health. We will continue to monitor.     Expected Outcomes Patient's psychosocial issues will continue to be managed with no barriers identiified. Patient's psychosocial issues will continue to be managed with no barriers identiified. Patient's psychosocial issues will continue to be managed with no barriers identiified.     Interventions Stress management education;Relaxation education;Encouraged to attend Cardiac Rehabilitation for the exercise  Stress management education;Relaxation education;Encouraged to attend Cardiac Rehabilitation for the exercise Stress management education;Relaxation education;Encouraged to attend Cardiac Rehabilitation for the exercise     Continue Psychosocial Services  -- No Follow up required No Follow up required       Initial Review   Source of Stress Concerns -- Chronic Illness;Family Chronic Illness;Family     Comments -- Her brother and her son live with her. Her son is a recovering addict. Her brother has dementia and has poor hygiene. Her largest souce of stress is her health and her medications. Her brother and her son live with her. Her son is a recovering addict. Her brother has dementia and has poor hygiene. Her largest souce of stress is her health and her medications.              Psychosocial Discharge (Final Psychosocial Re-Evaluation):  Psychosocial Re-Evaluation - 11/22/21 3825       Psychosocial Re-Evaluation   Current issues with Current Depression;Current Anxiety/Panic;Current Stress Concerns    Comments Patient has completed 20 sessions. Her pcp continues to manage her psychosocial issues of depression and anxiety. She continues taking Buspar and Alprazolam and  feels her psychosocial issues are managed. She seems to enjoy coming to the program and demonstrates an interest in improving her health. We will continue to monitor.    Expected Outcomes Patient's psychosocial issues will continue to be managed with no barriers identiified.    Interventions Stress management education;Relaxation education;Encouraged to attend Cardiac Rehabilitation for the exercise    Continue Psychosocial Services  No Follow up required      Initial Review   Source of Stress Concerns Chronic Illness;Family    Comments Her brother and her son live with her. Her son is a recovering addict. Her brother has dementia and has poor hygiene. Her largest souce of stress is her health and her medications.              Vocational Rehabilitation: Provide vocational rehab assistance to qualifying candidates.   Vocational Rehab Evaluation & Intervention:  Vocational Rehab - 09/16/21 1335       Initial Vocational Rehab Evaluation & Intervention   Assessment shows need for Vocational Rehabilitation No             Education: Education Goals: Education classes will be provided on a weekly basis, covering required topics. Participant will state understanding/return demonstration of topics presented.  Learning Barriers/Preferences:  Learning Barriers/Preferences - 09/16/21 1329       Learning Barriers/Preferences   Learning Barriers Sight    Learning Preferences Audio  Education Topics: Hypertension, Hypertension Reduction -Define heart disease and high blood pressure. Discus how high blood pressure affects the body and ways to reduce high blood pressure. Flowsheet Row CARDIAC REHAB PHASE II EXERCISE from 11/17/2021 in [place]  Date 11/10/21  Educator DJ  Instruction Review Code 2- Demonstrated Understanding       Exercise and Your Heart -Discuss why it is important to exercise, the FITT principles of exercise, normal and abnormal responses to exercise, and how to exercise safely. Flowsheet Row CARDIAC REHAB PHASE II EXERCISE from 11/17/2021 in [place]  Date 11/17/21  Educator DF  Instruction Review Code 2- Demonstrated Understanding       Angina -Discuss definition of angina, causes of angina, treatment of angina, and how to decrease risk of having angina. Flowsheet Row CARDIAC REHAB PHASE II EXERCISE from 11/24/2021 in [place]  Date 11/24/21  Educator [place]  Instruction Review Code 2- Demonstrated Understanding       Cardiac Medications -Review what the following cardiac medications are used for, how they affect the body, and side effects that may occur when taking the medications.   Medications include Aspirin, Beta blockers, calcium channel blockers, ACE Inhibitors, angiotensin receptor blockers, diuretics, digoxin, and antihyperlipidemics.   Congestive Heart Failure -Discuss the definition of CHF, how to live with CHF, the signs and symptoms of CHF, and how keep track of weight and sodium intake.   Heart Disease and Intimacy -Discus the effect sexual activity has on the heart, how changes occur during intimacy as we age, and safety during sexual activity.   Smoking Cessation / COPD -Discuss different methods to quit smoking, the health benefits of quitting smoking, and the definition of COPD.   Nutrition I: Fats -Discuss the types of cholesterol, what cholesterol does to the heart, and how cholesterol levels can be controlled.   Nutrition II: Labels -Discuss the different components of food labels and how to read food label [place] from 11/17/2021 in [place]  Date 10/06/21  Educator pb  Instruction Review Code 1- Verbalizes Understanding       Heart Parts/Heart Disease and PAD -Discuss the anatomy of the heart, the pathway of blood circulation through the heart, and these are affected by heart disease.   Stress I: Signs and Symptoms -Discuss the causes of stress, how stress may lead to anxiety and depression, and ways to limit stress. Flowsheet Row CARDIAC REHAB PHASE II EXERCISE from 11/17/2021 in [place]  Date 10/20/21  Educator DJ  Instruction Review Code 1- Verbalizes Understanding       Stress II: Relaxation -Discuss different types of relaxation techniques to limit stress. Flowsheet Row CARDIAC REHAB PHASE II EXERCISE from 11/17/2021 in [place]  Date 10/27/21  Educator hj  Instruction Review Code 2- Demonstrated Understanding       Warning Signs of Stroke / TIA -Discuss definition of a stroke, what the signs and symptoms  are of a stroke, and how to identify when someone is having stroke.   Knowledge Questionnaire Score:  Knowledge Questionnaire Score - 09/16/21 1329       Knowledge Questionnaire Score   Pre Score 19/28             Core Components/Risk Factors/Patient Goals at Admission:  Personal Goals and Risk Factors at Admission - 09/16/21 1335       Core Components/Risk Factors/Patient Goals on Admission  Weight Management Yes;Weight Loss;Obesity    Intervention Weight Management/Obesity: Establish reasonable short term and long term weight goals.;Obesity: Provide education and appropriate resources to help participant work on and attain dietary goals.;Weight Management: Develop a combined nutrition and exercise program designed to reach desired caloric intake, while maintaining appropriate intake of nutrient and fiber, sodium and fats, and appropriate energy expenditure required for the weight goal.;Weight Management: Provide education and appropriate resources to help participant work on and attain dietary goals.    Expected Outcomes Short Term: Continue to assess and modify interventions until short term weight is achieved;Long Term: Adherence to nutrition and physical activity/exercise program aimed toward attainment of established weight goal;Weight Loss: Understanding of general recommendations for a balanced deficit meal plan, which promotes 1-2 lb weight loss per week and includes a negative energy balance of [phone] kcal/d;Understanding recommendations for meals to include 15-35% energy as protein, 25-35% energy from fat, 35-60% energy from carbohydrates, less than 200mg  of dietary cholesterol, 20-35 gm of total fiber daily;Understanding of distribution of calorie intake throughout the day with the consumption of 4-5 meals/snacks;Weight Maintenance: Understanding of the daily nutrition guidelines, which includes 25-35% calories from fat, 7% or less cal from saturated fats, less than 200mg   cholesterol, less than 1.5gm of sodium, & 5 or more servings of fruits and vegetables daily    Tobacco Cessation Yes    Number of packs per day 1    Intervention Assist the participant in steps to quit. Provide individualized education and counseling about committing to Tobacco Cessation, relapse prevention, and pharmacological support that can be provided by physician.;Advice worker, assist with locating and accessing local/national Quit Smoking programs, and support quit date choice.    Expected Outcomes Short Term: Will demonstrate readiness to quit, by selecting a quit date.;Short Term: Will quit all tobacco product use, adhering to prevention of relapse plan.;Long Term: Complete abstinence from all tobacco products for at least 12 months from quit date.    Improve shortness of breath with ADL's Yes    Intervention Provide education, individualized exercise plan and daily activity instruction to help decrease symptoms of SOB with activities of daily living.    Expected Outcomes Short Term: Improve cardiorespiratory fitness to achieve a reduction of symptoms when performing ADLs;Long Term: Be able to perform more ADLs without symptoms or delay the onset of symptoms    Diabetes Yes    Intervention Provide education about signs/symptoms and action to take for hypo/hyperglycemia.;Provide education about proper nutrition, including hydration, and aerobic/resistive exercise prescription along with prescribed medications to achieve blood glucose in normal ranges: Fasting glucose 65-99 mg/dL    Expected Outcomes Short Term: Participant verbalizes understanding of the signs/symptoms and immediate care of hyper/hypoglycemia, proper foot care and importance of medication, aerobic/resistive exercise and nutrition plan for blood glucose control.;Long Term: Attainment of HbA1C < 7%.    Hypertension Yes    Intervention Provide education on lifestyle modifcations including regular physical  activity/exercise, weight management, moderate sodium restriction and increased consumption of fresh fruit, vegetables, and low fat dairy, alcohol moderation, and smoking cessation.;Monitor prescription use compliance.    Expected Outcomes Short Term: Continued assessment and intervention until BP is < 140/78mm HG in hypertensive participants. < 130/97mm HG in hypertensive participants with diabetes, heart failure or chronic kidney disease.;Long Term: Maintenance of blood pressure at goal levels.    Lipids Yes    Intervention Provide education and support for participant on nutrition & aerobic/resistive exercise along with prescribed medications to achieve LDL 70mg ,  HDL >40mg .    Expected Outcomes Short Term: Participant states understanding of desired cholesterol values and is compliant with medications prescribed. Participant is following exercise prescription and nutrition guidelines.;Long Term: Cholesterol controlled with medications as prescribed, with individualized exercise RX and with personalized nutrition plan. Value goals: LDL < 70mg , HDL > 40 mg.    Stress Yes    Intervention Offer individual and/or small group education and counseling on adjustment to heart disease, stress management and health-related lifestyle change. Teach and support self-help strategies.    Expected Outcomes Short Term: Participant demonstrates changes in health-related behavior, relaxation and other stress management skills, ability to obtain effective social support, and compliance with psychotropic medications if prescribed.;Long Term: Emotional wellbeing is indicated by absence of clinically significant psychosocial distress or social isolation.             Core Components/Risk Factors/Patient Goals Review:   Goals and Risk Factor Review     [place] Name 09/27/21 4010 10/21/21 0811 11/22/21 0827         Core Components/Risk Factors/Patient Goals Review   Personal Goals Review Weight  Management/Obesity;Diabetes;Tobacco Cessation Weight Management/Obesity;Diabetes;Tobacco Cessation Weight Management/Obesity;Diabetes;Tobacco Cessation     Review Patient was referred to CR with NSTEMI and S/P PTCA by Dr. Harl Bowie. She has multiple risk factors for CAD and is participating in the program for risk modification. She has completed 1 session. Her personal goals for the program are to quit smoking; lose weight; gain strength; and be able to cope with stress better. We will continue to monitor her progress as she works towards meeting these goals. Patient has completed 11 sessions. Her initial weight was 178.0 lbs and her current weight is 174.4 lbs. She is doing well in the program with progressions and consistent attendance. Her blood pressure is well controlled. She saw her pcp 12/6 for a routine check. Her HgbA1C was 9.2% which is trending down from 10.6%. Her increased both her mealtime and bedtime insulin dosage and she continues on Jardiance for DM controll. He also decreased her Lipitor. She is trying to quit smoking. She is using nicotine patches to help with smoking cessation. She is currently smoking 3 cigerattes/day down from 10/day when she started the program. She is pleased with her progress in the program. Her personal goals continue to be to quit smoking; lose weight; gain strength and be able to cope with stress better. We will continue to monitor her progress as she works towards meeting these goals. Patient has completed 20 sessions. Her initial weight was 178.0 lbs and her current weight is 178.4 lbs. She continues to do well in the program with progressions and consistent attendance. Her blood pressure is well controlled. Her HgbA1C was 9.2% which is trending down from 10.6%.  She is trying to quit smoking. She was using nicotine patches to help with smoking cessation. She reports that her smoking has increased to 1 ppd. She lives with a smoker which makes it harder to abstain. She is  pleased with her progress in the program. She saw Dr. Harl Bowie for the first time 11/19/21 for a routine check. She reported several episodes of chest pain to him with increased SOB and panic feelings which occurred at rest or with exertion. She has never complained to CP in CR. He increased her Imdur to 60 mg BID and she is to monitor her progress. Her personal goals continue to be to quit smoking; lose weight; gain strength and be able to cope with stress better. We  will continue to monitor her progress as she works towards meeting these goals.     Expected Outcomes Patient will complete the program meeting both personal and program goals. Patient will complete the program meeting both personal and program goals. Patient will complete the program meeting both personal and program goals.              Core Components/Risk Factors/Patient Goals at Discharge (Final Review):   Goals and Risk Factor Review - 11/22/21 0827       Core Components/Risk Factors/Patient Goals Review   Personal Goals Review Weight Management/Obesity;Diabetes;Tobacco Cessation    Review Patient has completed 20 sessions. Her initial weight was 178.0 lbs and her current weight is 178.4 lbs. She continues to do well in the program with progressions and consistent attendance. Her blood pressure is well controlled. Her HgbA1C was 9.2% which is trending down from 10.6%.  She is trying to quit smoking. She was using nicotine patches to help with smoking cessation. She reports that her smoking has increased to 1 ppd. She lives with a smoker which makes it harder to abstain. She is pleased with her progress in the program. She saw Dr. Harl Bowie for the first time 11/19/21 for a routine check. She reported several episodes of chest pain to him with increased SOB and panic feelings which occurred at rest or with exertion. She has never complained to CP in CR. He increased her Imdur to 60 mg BID and she is to monitor her progress. Her personal  goals continue to be to quit smoking; lose weight; gain strength and be able to cope with stress better. We will continue to monitor her progress as she works towards meeting these goals.    Expected Outcomes Patient will complete the program meeting both personal and program goals.             ITP Comments:   Comments: ITP REVIEW Pt is making expected progress toward Cardiac Rehab goals after completing 23 sessions. Recommend continued exercise, life style modification, education, and increased stamina and strength.

## 2021-12-01 NOTE — Telephone Encounter (Signed)
Resent medication to [employer].

## 2021-12-03 ENCOUNTER — Encounter (HOSPITAL_COMMUNITY)
Admission: RE | Admit: 2021-12-03 | Discharge: 2021-12-03 | Disposition: A | Discharge status: Home / Self Care | Payer: Medicare Other | Source: Ambulatory Visit | Attending: Cardiology | Admitting: Cardiology

## 2021-12-03 DIAGNOSIS — Z9861 Coronary angioplasty status: Secondary | ICD-10-CM | POA: Diagnosis not present

## 2021-12-03 DIAGNOSIS — I214 Non-ST elevation (NSTEMI) myocardial infarction: Secondary | ICD-10-CM

## 2021-12-03 NOTE — Progress Notes (Signed)
Daily Session Note  Patient Details  Name: Katelyn Kelly MRN: [id number] Date of Birth: 10-27-1961 Referring Provider:   Flowsheet Row CARDIAC REHAB PHASE II ORIENTATION from 09/16/2021 in [place]  Referring Provider Dr. Harl Bowie       Encounter Date: 12/03/2021  Check In:  Session Check In - 12/03/21 1445       Check-In   Supervising physician immediately available to respond to emergencies [facility] MD immediately available    Physician(s) Dr. Harrington Challenger    Location [facility]    Staff Present Redge Gainer, BS, Exercise Physiologist;Quindarrius [place] Wynetta Emery, RN, BSN    Virtual Visit No    Medication changes reported     No    Fall or balance concerns reported    No    Tobacco Cessation No Change    Warm-up and Cool-down Performed as group-led instruction    Resistance Training Performed Yes    VAD Patient? No    PAD/SET Patient? No      Pain Assessment   Currently in Pain? No/denies    Pain Score 0-No pain    Multiple Pain Sites No             Capillary Blood Glucose: No results found for this or any previous visit (from the past 24 hour(s)).    Social History   Tobacco Use  Smoking Status Every Day   Packs/day: 1.00   Years: 40.00   Pack years: 40.00   Types: Cigarettes  Smokeless Tobacco Never  Tobacco Comments   one pack a day. Pt has purchased nicotine patches. She has a plan to stop and is going to set a quit date.     Goals Met:  Independence with exercise equipment Exercise tolerated well No report of concerns or symptoms today Strength training completed today  Goals Unmet:  Not Applicable  Comments: Check out 1545   Dr. Carlyle Dolly is Medical Director for [place]

## 2021-12-06 ENCOUNTER — Encounter (HOSPITAL_COMMUNITY)
Admission: RE | Admit: 2021-12-06 | Discharge: 2021-12-06 | Disposition: A | Discharge status: Home / Self Care | Payer: Medicare Other | Source: Ambulatory Visit | Attending: Cardiology | Admitting: Cardiology

## 2021-12-06 DIAGNOSIS — Z9861 Coronary angioplasty status: Secondary | ICD-10-CM

## 2021-12-06 DIAGNOSIS — I214 Non-ST elevation (NSTEMI) myocardial infarction: Secondary | ICD-10-CM

## 2021-12-06 NOTE — Progress Notes (Signed)
Daily Session Note  Patient Details  Name: Katelyn Kelly MRN: [id number] Date of Birth: Jul 12, 1961 Referring Provider:   Flowsheet Row CARDIAC REHAB PHASE II ORIENTATION from 09/16/2021 in [place]  Referring Provider Dr. Harl Bowie       Encounter Date: 12/06/2021  Check In:  Session Check In - 12/06/21 1445       Check-In   Supervising physician immediately available to respond to emergencies [facility] MD immediately available    Physician(s) Dr. Audie Box    Location [facility]    Staff Present Redge Gainer, BS, Exercise Physiologist;Debra Wynetta Emery, RN, Bjorn Loser, MS, ACSM-CEP, Exercise Physiologist    Virtual Visit No    Medication changes reported     No    Fall or balance concerns reported    No    Tobacco Cessation No Change    Current number of cigarettes/nicotine per day     25    Warm-up and Cool-down Performed as group-led instruction    Resistance Training Performed Yes    VAD Patient? No    PAD/SET Patient? No      Pain Assessment   Currently in Pain? No/denies    Pain Score 0-No pain    Multiple Pain Sites No             Capillary Blood Glucose: No results found for this or any previous visit (from the past 24 hour(s)).    Social History   Tobacco Use  Smoking Status Every Day   Packs/day: 1.00   Years: 40.00   Pack years: 40.00   Types: Cigarettes  Smokeless Tobacco Never  Tobacco Comments   one pack a day. Pt has purchased nicotine patches. She has a plan to stop and is going to set a quit date.     Goals Met:  Independence with exercise equipment Exercise tolerated well No report of concerns or symptoms today Strength training completed today  Goals Unmet:  Not Applicable  Comments: check out 1545   Dr. Carlyle Dolly is Medical Director for [place]

## 2021-12-07 IMAGING — CT CT CHEST HIGH RESOLUTION
2 of 5 series · 14 of 36 positions shown, 17 images · non-contrast
Comparison: Chest CTA 06/26/2021.

CLINICAL DATA: 60-year-old female with history of shortness of
breath on exertion for the past several months.

EXAM:
CT CHEST WITHOUT CONTRAST
TECHNIQUE: Multidetector CT imaging of the chest was performed following the
standard protocol without intravenous contrast. High resolution
imaging of the lungs, as well as inspiratory and expiratory imaging,
was performed.

[Series 3: standard chest · axial · 0.81mm/px · z∈[+1026,+1290]mm · 11 of 153 slices shown, 14 images]
[im 14/153  mediastinal]
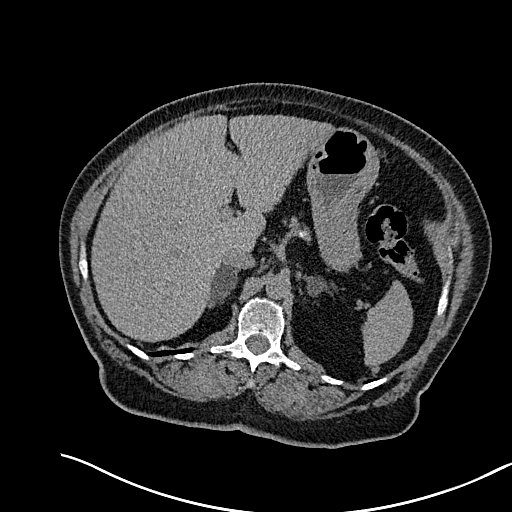
[im 14/153  lung]
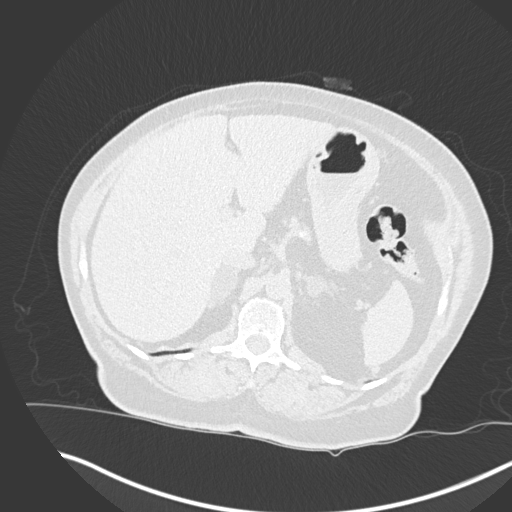
[im 27/153  lung]
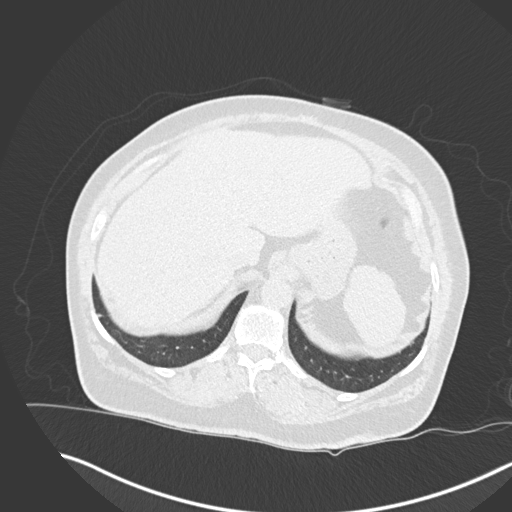
[im 40/153  lung]
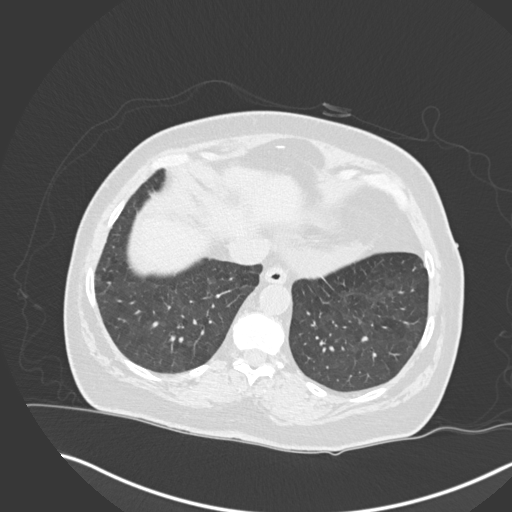
[im 53/153  lung]
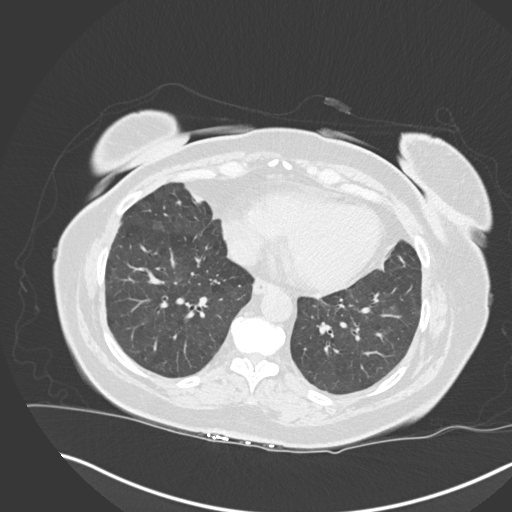
[im 67/153  mediastinal]
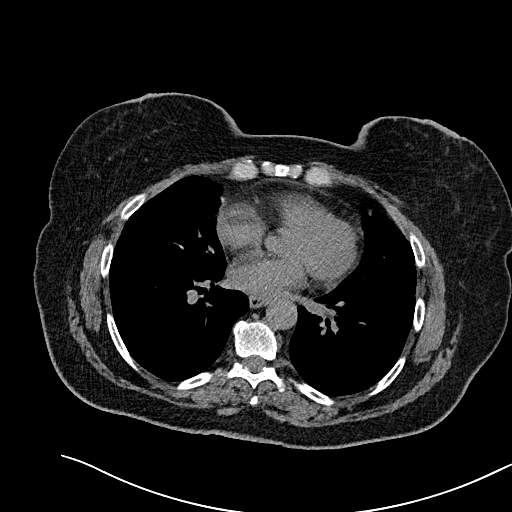
[im 67/153  lung]
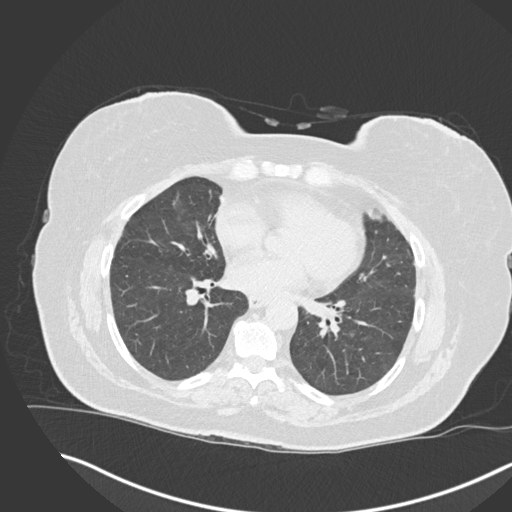
[im 80/153  lung]
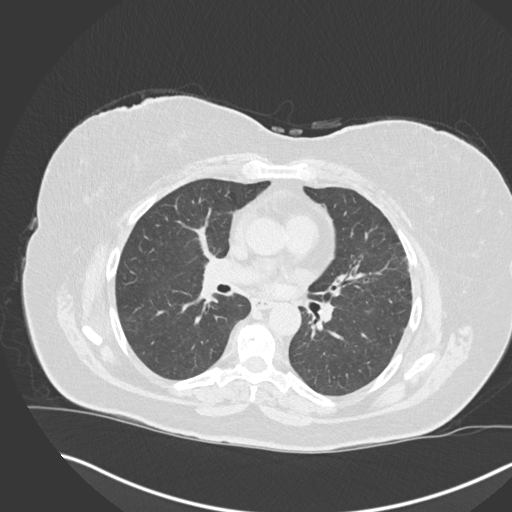
[im 93/153  lung]
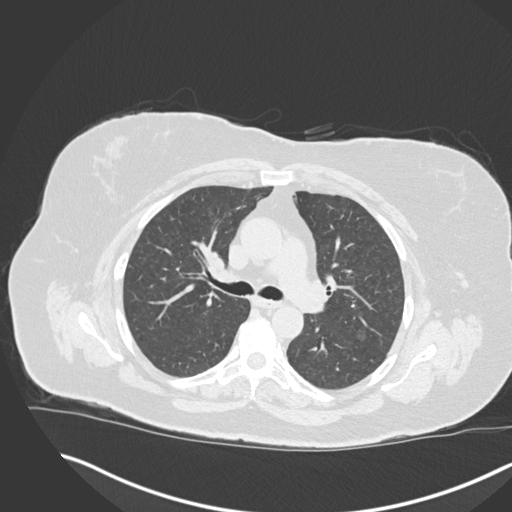
[im 106/153  lung]
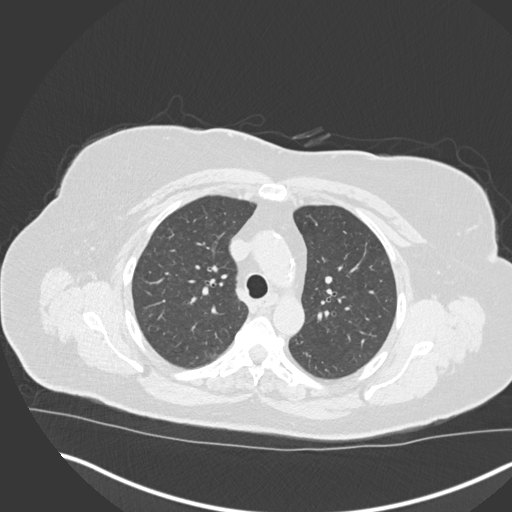
[im 119/153  mediastinal]
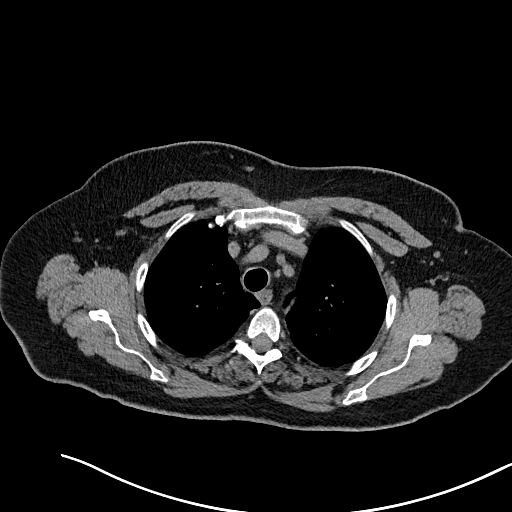
[im 119/153  lung]
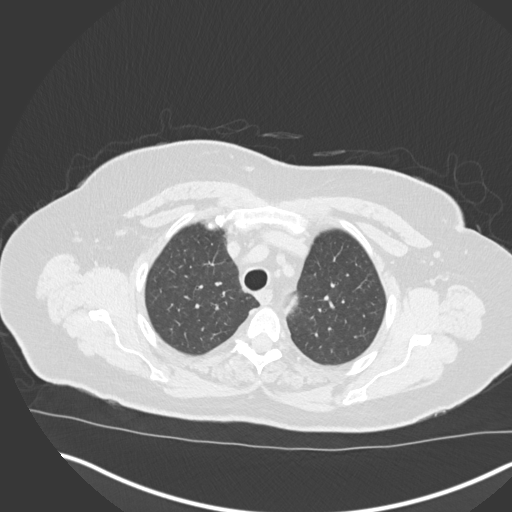
[im 133/153  lung]
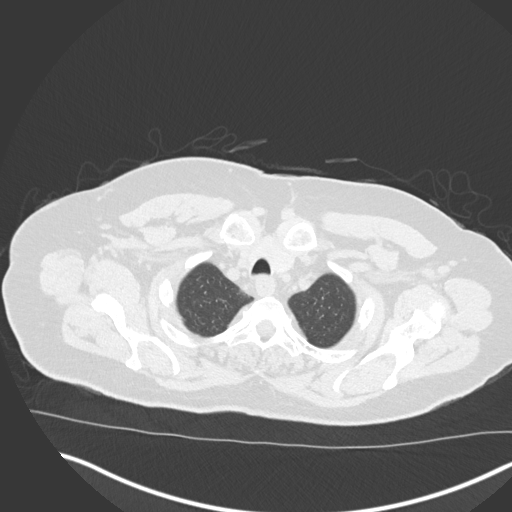
[im 146/153  lung]
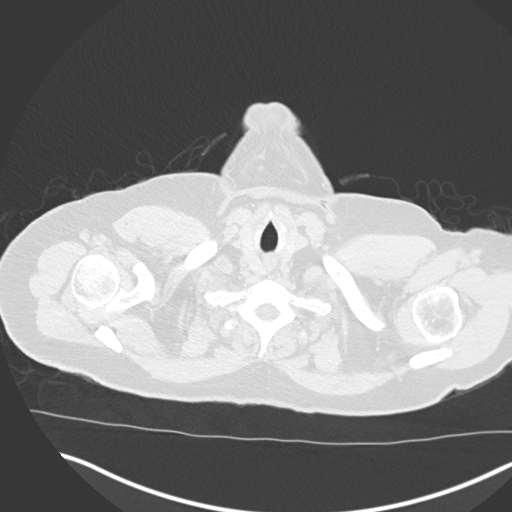

[Series 7: coronal · coronal · 0.60mm/px · 3 of 151 slices shown]
[im 31/151  lung]
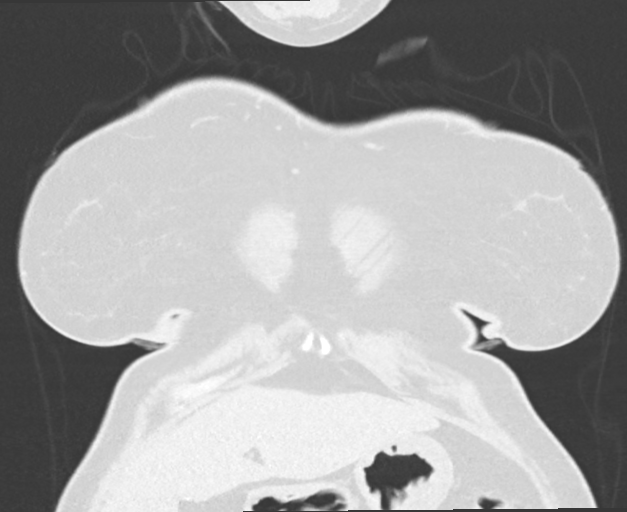
[im 61/151  lung]
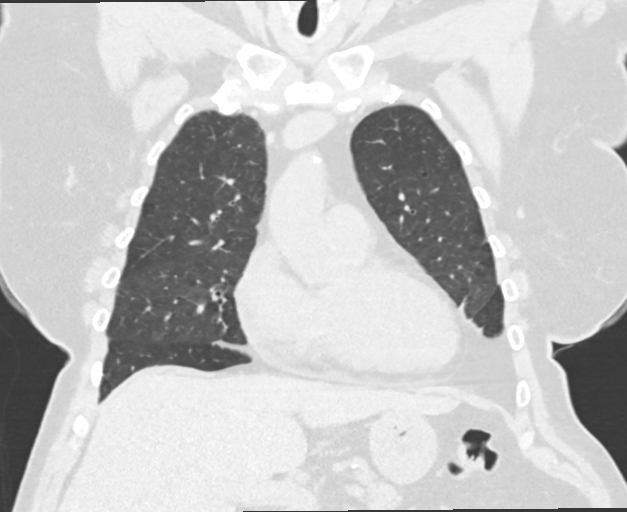
[im 91/151  lung]
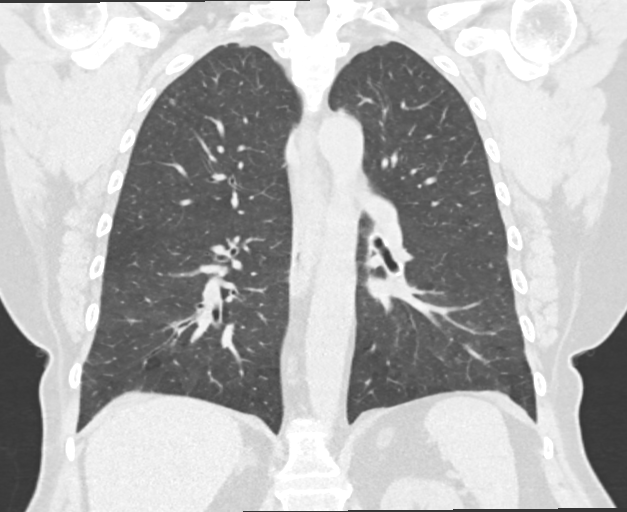

[14 of 36 positions shown; findings below may reference images not displayed]

FINDINGS: Cardiovascular: Heart size is normal. There is no significant
pericardial fluid, thickening or pericardial calcification. Aortic
atherosclerosis. No definite coronary artery calcifications.

Mediastinum/Nodes: No pathologically enlarged mediastinal or hilar
lymph nodes. Please note that accurate exclusion of hilar adenopathy
is limited on noncontrast CT scans. Esophagus is unremarkable in
appearance. No axillary lymphadenopathy.

Lungs/Pleura: High-resolution images demonstrates some very mild
rather generalized ground-glass attenuation. There is also a pattern
of centrilobular ground-glass attenuation micro nodularity
throughout the lungs bilaterally. Minimal septal thickening is noted
in the lung bases. In the medial segment of the right middle lobe
and inferior segment of the lingula there is some very mild
cylindrical bronchiectasis with thickening of the
peribronchovascular interstitium and regional architectural
distortion and volume loss, most compatible with areas of chronic
post infectious or inflammatory scarring. No other regions of
traction bronchiectasis or honeycombing identified. Inspiratory and
expiratory imaging demonstrates some moderate air trapping
indicative of small airways disease.

Upper Abdomen: [DATE] x 2.1 cm low-attenuation (-9 HU) right adrenal
nodule and 1.8 x 1.3 cm low-attenuation (-12 HU) left adrenal nodule
are similar to prior examination and compatible with benign adrenal
adenomas. Status post cholecystectomy. Aortic atherosclerosis.

Musculoskeletal: There are no aggressive appearing lytic or blastic
lesions noted in the visualized portions of the skeleton.
IMPRESSION: 1. The appearance of the lungs is most suggestive of an alternative
diagnosis (not usual interstitial pneumonia) per current ATS
guidelines. Specifically, imaging findings are most suggestive of a
smoking related disease such as respiratory bronchiolitis (MEEKOSHA), or
if the patient is symptomatic, respiratory bronchiolitis
interstitial lung disease (MYDUL).
2. Aortic atherosclerosis.
3. Bilateral adrenal adenomas, as above.

Aortic Atherosclerosis ([id number]-[id number].E).

## 2021-12-08 ENCOUNTER — Encounter (HOSPITAL_COMMUNITY): Payer: Medicare Other

## 2021-12-10 ENCOUNTER — Encounter (HOSPITAL_COMMUNITY)
Admission: RE | Admit: 2021-12-10 | Discharge: 2021-12-10 | Disposition: A | Discharge status: Home / Self Care | Payer: Medicare Other | Source: Ambulatory Visit | Attending: Cardiology | Admitting: Cardiology

## 2021-12-10 VITALS — Ht 61.5 in | Wt 180.8 lb

## 2021-12-10 DIAGNOSIS — Z9861 Coronary angioplasty status: Secondary | ICD-10-CM

## 2021-12-10 DIAGNOSIS — I214 Non-ST elevation (NSTEMI) myocardial infarction: Secondary | ICD-10-CM

## 2021-12-10 NOTE — Progress Notes (Signed)
Daily Session Note  Patient Details  Name: Katelyn Kelly MRN: [id number] Date of Birth: 1961/05/07 Referring Provider:   Flowsheet Row CARDIAC REHAB PHASE II ORIENTATION from 09/16/2021 in [place]  Referring Provider Dr. Harl Bowie       Encounter Date: 12/10/2021  Check In:  Session Check In - 12/10/21 1445       Check-In   Supervising physician immediately available to respond to emergencies [facility] MD immediately available    Physician(s) Dr Marlou Porch    Location [facility]    Staff Present Aundra Dubin, RN, Madlyn Frankel, RN, Bjorn Loser, MS, ACSM-CEP, Exercise Physiologist;Heather Zigmund Troyce Febo, Exercise Physiologist    Virtual Visit No    Medication changes reported     No    Fall or balance concerns reported    No    Tobacco Cessation No Change    Warm-up and Cool-down Performed as group-led instruction    Resistance Training Performed Yes    VAD Patient? No    PAD/SET Patient? No      Pain Assessment   Currently in Pain? No/denies    Pain Score 0-No pain             Capillary Blood Glucose: No results found for this or any previous visit (from the past 24 hour(s)).    Social History   Tobacco Use  Smoking Status Every Day   Packs/day: 1.00   Years: 40.00   Pack years: 40.00   Types: Cigarettes  Smokeless Tobacco Never  Tobacco Comments   one pack a day. Pt has purchased nicotine patches. She has a plan to stop and is going to set a quit date.     Goals Met:  Independence with exercise equipment Exercise tolerated well No report of concerns or symptoms today  Goals Unmet:  Not Applicable  Comments: checkout 1545    Dr. Carlyle Dolly is Medical Director for [place]

## 2021-12-16 NOTE — Progress Notes (Signed)
Discharge Progress Report  Patient Details  Name: Katelyn Kelly MRN: [id number] Date of Birth: 03/29/1961 Referring Provider:   Flowsheet Row CARDIAC REHAB PHASE II ORIENTATION from 09/16/2021 in [place]  Referring Provider Dr. Harl Bowie        Number of Visits: 28  Reason for Discharge:  Patient reached a stable level of exercise. Patient independent in their exercise. Patient has met program and personal goals.  Smoking History:  Social History   Tobacco Use  Smoking Status Every Day   Packs/day: 1.00   Years: 40.00   Pack years: 40.00   Types: Cigarettes  Smokeless Tobacco Never  Tobacco Comments   one pack a day. Pt has purchased nicotine patches. She has a plan to stop and is going to set a quit date.     Diagnosis:  NSTEMI (non-ST elevated myocardial infarction) ([place])  S/P PTCA (percutaneous transluminal coronary angioplasty)  ADL [facility]:   Initial Exercise Prescription:  Initial Exercise Prescription - 09/16/21 1400       Date of Initial Exercise RX and Referring Provider   Date 09/16/21    Referring Provider Dr. Harl Bowie    Expected Discharge Date 12/10/21      NuStep   Level 1    SPM 60    Minutes 39      Prescription Details   Frequency (times per week) 3    Duration Progress to 30 minutes of continuous aerobic without signs/symptoms of physical distress      Intensity   THRR 40-80% of Max Heartrate 63-126    Ratings of Perceived Exertion 11-13    Perceived Dyspnea 0-4      Resistance Training   Training Prescription Yes    Weight 3    Reps 10-15             Discharge Exercise Prescription (Final Exercise Prescription Changes):  Exercise Prescription Changes - 11/29/21 1445       Response to Exercise   Blood Pressure (Admit) 98/58    Blood Pressure (Exercise) 132/62    Blood Pressure (Exit) 110/60    Heart Rate (Admit) 93 bpm    Heart Rate (Exercise) 111 bpm    Heart Rate (Exit) 102 bpm    Rating of  Perceived Exertion (Exercise) 11    Duration Continue with 30 min of aerobic exercise without signs/symptoms of physical distress.    Intensity THRR unchanged      Progression   Progression Continue to progress workloads to maintain intensity without signs/symptoms of physical distress.      Resistance Training   Training Prescription Yes    Weight 3    Reps 10-15    Time 10 Minutes      NuStep   Level 3    SPM 138    Minutes 39    METs 1.9             Functional Capacity:  6 Minute Walk     Row Name 09/16/21 1419 09/16/21 1441 12/10/21 1511     6 Minute Walk   Phase Initial (P)  Initial Discharge   Distance 1000 feet (P)  1000 feet 1250 feet   Walk Time 6 minutes (P)  6 minutes 6 minutes   # of Rest Breaks 0 (P)  0 0   MPH 1.9 (P)  1.9 2.4   METS 2.6 (P)  2.6 3.38   RPE 12 (P)  12 12   VO2 Peak 9.11 (P)  9.11 11.85   Symptoms No (P)  No No   Resting HR -- 81 bpm 88 bpm   Resting BP -- 124/70 110/58   Resting Oxygen Saturation  -- 98 % 98 %   Exercise Oxygen Saturation  during 6 min walk -- 98 % 98 %   Max Ex. HR -- 110 bpm 136 bpm   Max Ex. BP -- 140/75 150/58   2 Minute Post BP -- 128/70 120/58            Psychological, QOL, Others - Outcomes: PHQ 2/9: Depression screen [facility] 2/9 12/16/2021 09/16/2021  Decreased Interest 3 3  Down, Depressed, Hopeless 3 3  PHQ - 2 Score 6 6  Altered sleeping 3 2  Tired, decreased energy 3 3  Change in appetite 3 3  Feeling bad or failure about yourself  3 3  Trouble concentrating 3 2  Moving slowly or fidgety/restless 0 0  Suicidal thoughts 0 0  PHQ-9 Score 21 19  Difficult doing work/chores - Very difficult    Quality of Life:  Quality of Life - 12/10/21 1512       Quality of Life   Select Quality of Life      Quality of Life Scores   Health/Function Pre 17.45 %    Health/Function Post 25.33 %    Health/Function % Change 45.16 %    Socioeconomic Pre 18.5 %    Socioeconomic Post 28 %    Socioeconomic  % Change  51.35 %    Psych/Spiritual Pre 12.5 %    Psych/Spiritual Post 30 %    Psych/Spiritual % Change 140 %    Family Pre 30 %    GLOBAL Pre 19.22 %    GLOBAL Post 26.31 %    GLOBAL % Change 36.89 %             Personal Goals: Goals established at orientation with interventions provided to work toward goal.  Personal Goals and Risk Factors at Admission - 09/16/21 1335       Core Components/Risk Factors/Patient Goals on Admission    Weight Management Yes;Weight Loss;Obesity    Intervention Weight Management/Obesity: Establish reasonable short term and long term weight goals.;Obesity: Provide education and appropriate resources to help participant work on and attain dietary goals.;Weight Management: Develop a combined nutrition and exercise program designed to reach desired caloric intake, while maintaining appropriate intake of nutrient and fiber, sodium and fats, and appropriate energy expenditure required for the weight goal.;Weight Management: Provide education and appropriate resources to help participant work on and attain dietary goals.    Expected Outcomes Short Term: Continue to assess and modify interventions until short term weight is achieved;Long Term: Adherence to nutrition and physical activity/exercise program aimed toward attainment of established weight goal;Weight Loss: Understanding of general recommendations for a balanced deficit meal plan, which promotes 1-2 lb weight loss per week and includes a negative energy balance of [phone] kcal/d;Understanding recommendations for meals to include 15-35% energy as protein, 25-35% energy from fat, 35-60% energy from carbohydrates, less than $RemoveB'200mg'WvrpqGMV$  of dietary cholesterol, 20-35 gm of total fiber daily;Understanding of distribution of calorie intake throughout the day with the consumption of 4-5 meals/snacks;Weight Maintenance: Understanding of the daily nutrition guidelines, which includes 25-35% calories from fat, 7% or less cal  from saturated fats, less than $RemoveB'200mg'jntDxAoy$  cholesterol, less than 1.5gm of sodium, & 5 or more servings of fruits and vegetables daily    Tobacco Cessation Yes    Number of packs  per day 1    Intervention Assist the participant in steps to quit. Provide individualized education and counseling about committing to Tobacco Cessation, relapse prevention, and pharmacological support that can be provided by physician.;Advice worker, assist with locating and accessing local/national Quit Smoking programs, and support quit date choice.    Expected Outcomes Short Term: Will demonstrate readiness to quit, by selecting a quit date.;Short Term: Will quit all tobacco product use, adhering to prevention of relapse plan.;Long Term: Complete abstinence from all tobacco products for at least 12 months from quit date.    Improve shortness of breath with ADL's Yes    Intervention Provide education, individualized exercise plan and daily activity instruction to help decrease symptoms of SOB with activities of daily living.    Expected Outcomes Short Term: Improve cardiorespiratory fitness to achieve a reduction of symptoms when performing ADLs;Long Term: Be able to perform more ADLs without symptoms or delay the onset of symptoms    Diabetes Yes    Intervention Provide education about signs/symptoms and action to take for hypo/hyperglycemia.;Provide education about proper nutrition, including hydration, and aerobic/resistive exercise prescription along with prescribed medications to achieve blood glucose in normal ranges: Fasting glucose 65-99 mg/dL    Expected Outcomes Short Term: Participant verbalizes understanding of the signs/symptoms and immediate care of hyper/hypoglycemia, proper foot care and importance of medication, aerobic/resistive exercise and nutrition plan for blood glucose control.;Long Term: Attainment of HbA1C < 7%.    Hypertension Yes    Intervention Provide education on lifestyle modifcations  including regular physical activity/exercise, weight management, moderate sodium restriction and increased consumption of fresh fruit, vegetables, and low fat dairy, alcohol moderation, and smoking cessation.;Monitor prescription use compliance.    Expected Outcomes Short Term: Continued assessment and intervention until BP is < 140/26mm HG in hypertensive participants. < 130/77mm HG in hypertensive participants with diabetes, heart failure or chronic kidney disease.;Long Term: Maintenance of blood pressure at goal levels.    Lipids Yes    Intervention Provide education and support for participant on nutrition & aerobic/resistive exercise along with prescribed medications to achieve LDL '70mg'$ , HDL >$Remo'40mg'DHBgN$ .    Expected Outcomes Short Term: Participant states understanding of desired cholesterol values and is compliant with medications prescribed. Participant is following exercise prescription and nutrition guidelines.;Long Term: Cholesterol controlled with medications as prescribed, with individualized exercise RX and with personalized nutrition plan. Value goals: LDL < $Rem'70mg'FPAe$ , HDL > 40 mg.    Stress Yes    Intervention Offer individual and/or small group education and counseling on adjustment to heart disease, stress management and health-related lifestyle change. Teach and support self-help strategies.    Expected Outcomes Short Term: Participant demonstrates changes in health-related behavior, relaxation and other stress management skills, ability to obtain effective social support, and compliance with psychotropic medications if prescribed.;Long Term: Emotional wellbeing is indicated by absence of clinically significant psychosocial distress or social isolation.              Personal Goals Discharge:  Goals and Risk Factor Review     Row Name 09/27/21 8638 10/21/21 0811 11/22/21 0827 12/16/21 1519       Core Components/Risk Factors/Patient Goals Review   Personal Goals Review Weight  Management/Obesity;Diabetes;Tobacco Cessation Weight Management/Obesity;Diabetes;Tobacco Cessation Weight Management/Obesity;Diabetes;Tobacco Cessation Weight Management/Obesity;Diabetes;Tobacco Cessation    Review Patient was referred to [facility] with NSTEMI and S/P PTCA by Dr. Harl Bowie. She has multiple risk factors for CAD and is participating in the program for risk modification. She has completed 1 session. Her  personal goals for the program are to quit smoking; lose weight; gain strength; and be able to cope with stress better. We will continue to monitor her progress as she works towards meeting these goals. Patient has completed 11 sessions. Her initial weight was 178.0 lbs and her current weight is 174.4 lbs. She is doing well in the program with progressions and consistent attendance. Her blood pressure is well controlled. She saw her pcp 12/6 for a routine check. Her HgbA1C was 9.2% which is trending down from 10.6%. Her increased both her mealtime and bedtime insulin dosage and she continues on Jardiance for DM controll. He also decreased her Lipitor. She is trying to quit smoking. She is using nicotine patches to help with smoking cessation. She is currently smoking 3 cigerattes/day down from 10/day when she started the program. She is pleased with her progress in the program. Her personal goals continue to be to quit smoking; lose weight; gain strength and be able to cope with stress better. We will continue to monitor her progress as she works towards meeting these goals. Patient has completed 20 sessions. Her initial weight was 178.0 lbs and her current weight is 178.4 lbs. She continues to do well in the program with progressions and consistent attendance. Her blood pressure is well controlled. Her HgbA1C was 9.2% which is trending down from 10.6%.  She is trying to quit smoking. She was using nicotine patches to help with smoking cessation. She reports that her smoking has increased to 1 ppd. She lives  with a smoker which makes it harder to abstain. She is pleased with her progress in the program. She saw Dr. Harl Bowie for the first time 11/19/21 for a routine check. She reported several episodes of chest pain to him with increased SOB and panic feelings which occurred at rest or with exertion. She has never complained to CP in [facility]. He increased her Imdur to 60 mg BID and she is to monitor her progress. Her personal goals continue to be to quit smoking; lose weight; gain strength and be able to cope with stress better. We will continue to monitor her progress as she works towards meeting these goals. Pt graduated after completing 28 sessions. She gained 3.2 lbs while in the program. Her attendance was consistent and she gave good progress. Her vitals were WNL. Her HgbA1C decreased from 10.6% to 9.2%. She still continues to smoke and has cut back to about a pack per day. She has been using nicotine patches to help her cut back. She was able to obtain her goal of getting stronger and improving her stamina. Her walk test distance increased by 25%. She has been having pain in her cervical spine, and has been referred to PT for this. She reports still being stressed due to her family situation with both her son and brother living with her.    Expected Outcomes Patient will complete the program meeting both personal and program goals. Patient will complete the program meeting both personal and program goals. Patient will complete the program meeting both personal and program goals. Pt will continue to work towards their goals post discharge.             Exercise Goals and Review:  Exercise Goals     Row Name 09/16/21 1445 10/05/21 1015 11/02/21 1352 11/30/21 1258       Exercise Goals   Increase Physical Activity Yes Yes Yes Yes    Intervention Provide advice, education, support and counseling about  physical activity/exercise needs.;Develop an individualized exercise prescription for aerobic and resistive  training based on initial evaluation findings, risk stratification, comorbidities and participant's personal goals. Provide advice, education, support and counseling about physical activity/exercise needs.;Develop an individualized exercise prescription for aerobic and resistive training based on initial evaluation findings, risk stratification, comorbidities and participant's personal goals. Provide advice, education, support and counseling about physical activity/exercise needs.;Develop an individualized exercise prescription for aerobic and resistive training based on initial evaluation findings, risk stratification, comorbidities and participant's personal goals. Provide advice, education, support and counseling about physical activity/exercise needs.;Develop an individualized exercise prescription for aerobic and resistive training based on initial evaluation findings, risk stratification, comorbidities and participant's personal goals.    Expected Outcomes Short Term: Attend rehab on a regular basis to increase amount of physical activity.;Long Term: Add in home exercise to make exercise part of routine and to increase amount of physical activity.;Long Term: Exercising regularly at least 3-5 days a week. Short Term: Attend rehab on a regular basis to increase amount of physical activity.;Long Term: Add in home exercise to make exercise part of routine and to increase amount of physical activity.;Long Term: Exercising regularly at least 3-5 days a week. Short Term: Attend rehab on a regular basis to increase amount of physical activity.;Long Term: Add in home exercise to make exercise part of routine and to increase amount of physical activity.;Long Term: Exercising regularly at least 3-5 days a week. Short Term: Attend rehab on a regular basis to increase amount of physical activity.;Long Term: Add in home exercise to make exercise part of routine and to increase amount of physical activity.;Long Term:  Exercising regularly at least 3-5 days a week.    Increase Strength and Stamina Yes Yes Yes Yes    Intervention Provide advice, education, support and counseling about physical activity/exercise needs.;Develop an individualized exercise prescription for aerobic and resistive training based on initial evaluation findings, risk stratification, comorbidities and participant's personal goals. Provide advice, education, support and counseling about physical activity/exercise needs.;Develop an individualized exercise prescription for aerobic and resistive training based on initial evaluation findings, risk stratification, comorbidities and participant's personal goals. Provide advice, education, support and counseling about physical activity/exercise needs.;Develop an individualized exercise prescription for aerobic and resistive training based on initial evaluation findings, risk stratification, comorbidities and participant's personal goals. Provide advice, education, support and counseling about physical activity/exercise needs.;Develop an individualized exercise prescription for aerobic and resistive training based on initial evaluation findings, risk stratification, comorbidities and participant's personal goals.    Expected Outcomes Short Term: Increase workloads from initial exercise prescription for resistance, speed, and METs.;Short Term: Perform resistance training exercises routinely during rehab and add in resistance training at home;Long Term: Improve cardiorespiratory fitness, muscular endurance and strength as measured by increased METs and functional capacity (6MWT) Short Term: Increase workloads from initial exercise prescription for resistance, speed, and METs.;Short Term: Perform resistance training exercises routinely during rehab and add in resistance training at home;Long Term: Improve cardiorespiratory fitness, muscular endurance and strength as measured by increased METs and functional capacity  (6MWT) Short Term: Increase workloads from initial exercise prescription for resistance, speed, and METs.;Short Term: Perform resistance training exercises routinely during rehab and add in resistance training at home;Long Term: Improve cardiorespiratory fitness, muscular endurance and strength as measured by increased METs and functional capacity (6MWT) Short Term: Increase workloads from initial exercise prescription for resistance, speed, and METs.;Short Term: Perform resistance training exercises routinely during rehab and add in resistance training at home;Long Term: Improve cardiorespiratory fitness,  muscular endurance and strength as measured by increased METs and functional capacity (6MWT)    Able to understand and use rate of perceived exertion (RPE) scale Yes Yes Yes Yes    Intervention Provide education and explanation on how to use RPE scale Provide education and explanation on how to use RPE scale Provide education and explanation on how to use RPE scale Provide education and explanation on how to use RPE scale    Expected Outcomes Short Term: Able to use RPE daily in rehab to express subjective intensity level;Long Term:  Able to use RPE to guide intensity level when exercising independently Short Term: Able to use RPE daily in rehab to express subjective intensity level;Long Term:  Able to use RPE to guide intensity level when exercising independently Short Term: Able to use RPE daily in rehab to express subjective intensity level;Long Term:  Able to use RPE to guide intensity level when exercising independently Short Term: Able to use RPE daily in rehab to express subjective intensity level;Long Term:  Able to use RPE to guide intensity level when exercising independently    Knowledge and understanding of Target Heart Rate Range (THRR) Yes Yes Yes Yes    Intervention Provide education and explanation of THRR including how the numbers were predicted and where they are located for reference  Provide education and explanation of THRR including how the numbers were predicted and where they are located for reference Provide education and explanation of THRR including how the numbers were predicted and where they are located for reference Provide education and explanation of THRR including how the numbers were predicted and where they are located for reference    Expected Outcomes Short Term: Able to state/look up THRR;Short Term: Able to use daily as guideline for intensity in rehab;Long Term: Able to use THRR to govern intensity when exercising independently Short Term: Able to state/look up THRR;Short Term: Able to use daily as guideline for intensity in rehab;Long Term: Able to use THRR to govern intensity when exercising independently Short Term: Able to state/look up THRR;Short Term: Able to use daily as guideline for intensity in rehab;Long Term: Able to use THRR to govern intensity when exercising independently Short Term: Able to state/look up THRR;Short Term: Able to use daily as guideline for intensity in rehab;Long Term: Able to use THRR to govern intensity when exercising independently    Able to check pulse independently Yes Yes Yes Yes    Intervention Provide education and demonstration on how to check pulse in carotid and radial arteries.;Review the importance of being able to check your own pulse for safety during independent exercise Provide education and demonstration on how to check pulse in carotid and radial arteries.;Review the importance of being able to check your own pulse for safety during independent exercise Provide education and demonstration on how to check pulse in carotid and radial arteries.;Review the importance of being able to check your own pulse for safety during independent exercise Provide education and demonstration on how to check pulse in carotid and radial arteries.;Review the importance of being able to check your own pulse for safety during independent  exercise    Expected Outcomes Short Term: Able to explain why pulse checking is important during independent exercise;Long Term: Able to check pulse independently and accurately Short Term: Able to explain why pulse checking is important during independent exercise;Long Term: Able to check pulse independently and accurately Short Term: Able to explain why pulse checking is important during independent exercise;Long Term:  Able to check pulse independently and accurately Short Term: Able to explain why pulse checking is important during independent exercise;Long Term: Able to check pulse independently and accurately    Understanding of Exercise Prescription Yes Yes Yes Yes    Intervention Provide education, explanation, and written materials on patient's individual exercise prescription Provide education, explanation, and written materials on patient's individual exercise prescription Provide education, explanation, and written materials on patient's individual exercise prescription Provide education, explanation, and written materials on patient's individual exercise prescription    Expected Outcomes Short Term: Able to explain program exercise prescription;Long Term: Able to explain home exercise prescription to exercise independently Short Term: Able to explain program exercise prescription;Long Term: Able to explain home exercise prescription to exercise independently Short Term: Able to explain program exercise prescription;Long Term: Able to explain home exercise prescription to exercise independently Short Term: Able to explain program exercise prescription;Long Term: Able to explain home exercise prescription to exercise independently             Exercise Goals Re-Evaluation:  Exercise Goals Re-Evaluation     Row Name 10/04/21 1535 11/02/21 1353 11/30/21 1258         Exercise Goal Re-Evaluation   Exercise Goals Review Increase Physical Activity;Increase Strength and Stamina;Able to  understand and use rate of perceived exertion (RPE) scale;Knowledge and understanding of Target Heart Rate Range (THRR);Able to check pulse independently;Understanding of Exercise Prescription Increase Physical Activity;Increase Strength and Stamina;Able to understand and use rate of perceived exertion (RPE) scale;Knowledge and understanding of Target Heart Rate Range (THRR);Able to check pulse independently;Understanding of Exercise Prescription Increase Physical Activity;Increase Strength and Stamina;Able to understand and use rate of perceived exertion (RPE) scale;Knowledge and understanding of Target Heart Rate Range (THRR);Able to check pulse independently;Understanding of Exercise Prescription     Comments Pt has completed 6 sessions of cardiac rehab. She seems to be motivated to improve her health and has progressed her workload during class. She is currently working at 2.09 METs in the stepper. Will continue to monitor and progress as able. Pt has completed 15 sessions of cardiac rehab. She has been advised by a vascular surgeon due to her carotid artery blockage to back off from level 2 to 1 on the stepper. She still continues to smokes. She is currently exercising at 2.16 METs on the stepper. Will continue to monitor and progress as able. Pt has completed 24 sessions of cardiac rehab. She has been able to progress her workloads and is motivated in class. She has been having back pain and has been referred to PT for this. She continues to smoke. She is currently exercising at 1.9 METs. Will continue to monitor and progress as able.     Expected Outcomes Through exercise at home and at rehab, the patient will meet their stated goals. Through exercise at home and at rehab, the patient will meet their stated goals. Through exercise at home and at rehab, the patient will meet their stated goals.              Nutrition & Weight - Outcomes:  Pre Biometrics - 09/16/21 1440       Pre Biometrics    Height 5' 1.5" (1.562 m)    Weight 178 lb 2.1 oz (80.8 kg)    Waist Circumference 42.5 inches    Hip Circumference 45 inches    Waist to Hip Ratio 0.94 %    BMI (Calculated) 33.12    Triceps Skinfold 19 mm    %  Body Fat 42.6 %    Grip Strength 20 kg    Flexibility 12 in    Single Leg Stand 12 seconds             Post Biometrics - 12/10/21 1513        Post  Biometrics   Height 5' 1.5" (1.562 m)    Weight 180 lb 12.4 oz (82 kg)    Waist Circumference 41.5 inches    Hip Circumference 45 inches    Waist to Hip Ratio 0.92 %    BMI (Calculated) 33.61    Triceps Skinfold 15 mm    % Body Fat 41.2 %    Grip Strength 29.7 kg    Flexibility 10 in    Single Leg Stand 30 seconds             Nutrition:  Nutrition Therapy & Goals - 11/22/21 0820       Personal Nutrition Goals   Comments Patient scored 87 on her diet assessment. We offer 2 educational sessions on heart healthy nutrition with handouts and assistance with RD referral if patient is interested.      Intervention Plan   Intervention Nutrition handout(s) given to patient.    Expected Outcomes Short Term Goal: Understand basic principles of dietary content, such as calories, fat, sodium, cholesterol and nutrients.             Nutrition Discharge:  Nutrition Assessments - 12/16/21 1512       MEDFICTS Scores   Pre Score 87    Post Score 12    Score Difference -75             Education Questionnaire Score:  Knowledge Questionnaire Score - 12/16/21 1511       Knowledge Questionnaire Score   Pre Score 19/28    Post Score 21/28             Goals reviewed with patient; copy given to patient. Pt graduated from CR after 28 sessions. She was able to improve her walk test distance by 25%. Her MET level was 2.86 at graduation. She reports that she is considering joining a gym to continue her exercise post rehab. We have given her information about our [employer] program and encouraged  her to consider joining.

## 2021-12-27 ENCOUNTER — Ambulatory Visit (HOSPITAL_COMMUNITY): Payer: Medicare Other | Attending: Physical Therapy | Admitting: Physical Therapy

## 2022-04-17 ENCOUNTER — Emergency Department (HOSPITAL_COMMUNITY)
Admission: EM | Admit: 2022-04-17 | Discharge: 2022-04-18 | Disposition: A | Discharge status: Home / Self Care | Payer: Medicare Other | Attending: Emergency Medicine | Admitting: Emergency Medicine

## 2022-04-17 ENCOUNTER — Other Ambulatory Visit: Payer: Self-pay

## 2022-04-17 ENCOUNTER — Encounter (HOSPITAL_COMMUNITY): Payer: Self-pay

## 2022-04-17 DIAGNOSIS — Z79899 Other long term (current) drug therapy: Secondary | ICD-10-CM | POA: Insufficient documentation

## 2022-04-17 DIAGNOSIS — F410 Panic disorder [episodic paroxysmal anxiety] without agoraphobia: Secondary | ICD-10-CM | POA: Insufficient documentation

## 2022-04-17 DIAGNOSIS — I2510 Atherosclerotic heart disease of native coronary artery without angina pectoris: Secondary | ICD-10-CM | POA: Diagnosis not present

## 2022-04-17 DIAGNOSIS — Z794 Long term (current) use of insulin: Secondary | ICD-10-CM | POA: Insufficient documentation

## 2022-04-17 DIAGNOSIS — R079 Chest pain, unspecified: Secondary | ICD-10-CM

## 2022-04-17 DIAGNOSIS — F419 Anxiety disorder, unspecified: Secondary | ICD-10-CM | POA: Diagnosis not present

## 2022-04-17 DIAGNOSIS — R0789 Other chest pain: Secondary | ICD-10-CM | POA: Insufficient documentation

## 2022-04-17 DIAGNOSIS — I25110 Atherosclerotic heart disease of native coronary artery with unstable angina pectoris: Secondary | ICD-10-CM

## 2022-04-17 DIAGNOSIS — Z7982 Long term (current) use of aspirin: Secondary | ICD-10-CM | POA: Insufficient documentation

## 2022-04-17 MED ORDER — ALPRAZOLAM 0.5 MG PO TABS
1.0000 mg | ORAL_TABLET | Freq: Once | ORAL | Status: AC
  Administered 2022-04-18: 1 mg via ORAL
  Filled 2022-04-17: qty 2

## 2022-04-17 NOTE — ED Triage Notes (Signed)
Pt c/o chest pain that radiates down left arm that started 2200. Pt feels SOB and has been diaphoretic. States she had MI last year with stent placement. Pt is very anxious in triage.
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
## 2022-04-18 ENCOUNTER — Emergency Department (HOSPITAL_COMMUNITY): Payer: Medicare Other

## 2022-04-18 DIAGNOSIS — R0789 Other chest pain: Secondary | ICD-10-CM | POA: Diagnosis not present

## 2022-04-18 LAB — CBC WITH DIFFERENTIAL/PLATELET
Abs Immature Granulocytes: 0.06 10*3/uL (ref 0.00–0.07)
Basophils Absolute: 0.1 10*3/uL (ref 0.0–0.1)
Basophils Relative: 1 %
Eosinophils Absolute: 0.2 10*3/uL (ref 0.0–0.5)
Eosinophils Relative: 1 %
HCT: 48.5 % — ABNORMAL HIGH (ref 36.0–46.0)
Hemoglobin: 16.2 g/dL — ABNORMAL HIGH (ref 12.0–15.0)
Immature Granulocytes: 0 %
Lymphocytes Relative: 28 %
Lymphs Abs: 4.8 10*3/uL — ABNORMAL HIGH (ref 0.7–4.0)
MCH: 31.5 pg (ref 26.0–34.0)
MCHC: 33.4 g/dL (ref 30.0–36.0)
MCV: 94.4 fL (ref 80.0–100.0)
Monocytes Absolute: 1 10*3/uL (ref 0.1–1.0)
Monocytes Relative: 6 %
Neutro Abs: 10.8 10*3/uL — ABNORMAL HIGH (ref 1.7–7.7)
Neutrophils Relative %: 64 %
Platelets: 235 10*3/uL (ref 150–400)
RBC: 5.14 MIL/uL — ABNORMAL HIGH (ref 3.87–5.11)
RDW: 12.8 % (ref 11.5–15.5)
WBC: 16.9 10*3/uL — ABNORMAL HIGH (ref 4.0–10.5)
nRBC: 0 % (ref 0.0–0.2)

## 2022-04-18 LAB — TROPONIN I (HIGH SENSITIVITY)
Troponin I (High Sensitivity): 5 ng/L (ref <18)
Troponin I (High Sensitivity): 6 ng/L (ref <18)

## 2022-04-18 LAB — BASIC METABOLIC PANEL
Anion gap: 8 (ref 5–15)
BUN: 16 mg/dL (ref 8–23)
CO2: 21 mmol/L — ABNORMAL LOW (ref 22–32)
Calcium: 8.9 mg/dL (ref 8.9–10.3)
Chloride: 111 mmol/L (ref 98–111)
Creatinine, Ser: 1.13 mg/dL — ABNORMAL HIGH (ref 0.44–1.00)
GFR, Estimated: 55 mL/min — ABNORMAL LOW (ref >60)
Glucose, Bld: 151 mg/dL — ABNORMAL HIGH (ref 70–99)
Potassium: 3.5 mmol/L (ref 3.5–5.1)
Sodium: 140 mmol/L (ref 135–145)

## 2022-04-18 NOTE — ED Notes (Signed)
Patient placed on 2L O2 d/t oxygen saturation dropping to 89-90%.
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
## 2022-05-04 ENCOUNTER — Telehealth: Payer: Self-pay | Admitting: Cardiology

## 2022-05-04 NOTE — Telephone Encounter (Signed)
Spoke with patient's pcp Dr Daron Offer. Patient with ongoing issues with migraine headaches, we discussed possibly starting aimovig which from a cardiac standpoint would be ok, just need to monitor bp's. HR's and bp's have been fine, if needed could titrate lopressor as well for additional migraine management   Zandra Abts MD

## 2022-05-04 NOTE — Telephone Encounter (Signed)
Spoke with patient's pcp Dr Daron Offer. Ok from cardiac standpoint to start [place] for migraines from cardiac standpoint, would just need to monitor bp's Room to titrate beta blocker as well if that becomes needed.   Carlyle Dolly MD

## 2022-05-20 ENCOUNTER — Ambulatory Visit (INDEPENDENT_AMBULATORY_CARE_PROVIDER_SITE_OTHER): Payer: Medicare Other | Admitting: Cardiology

## 2022-05-20 ENCOUNTER — Encounter: Payer: Self-pay | Admitting: Cardiology

## 2022-05-20 VITALS — BP 118/64 | HR 90 | Ht 61.5 in | Wt 183.0 lb

## 2022-05-20 DIAGNOSIS — I10 Essential (primary) hypertension: Secondary | ICD-10-CM

## 2022-05-20 DIAGNOSIS — E782 Mixed hyperlipidemia: Secondary | ICD-10-CM | POA: Diagnosis not present

## 2022-05-20 DIAGNOSIS — I2510 Atherosclerotic heart disease of native coronary artery without angina pectoris: Secondary | ICD-10-CM | POA: Diagnosis not present

## 2022-05-20 MED ORDER — TICAGRELOR 90 MG PO TABS: 90.0000 mg | ORAL_TABLET | Freq: Two times a day (BID) | ORAL | Status: AC

## 2022-05-20 MED ORDER — ASPIRIN 81 MG PO TBEC: 81.0000 mg | DELAYED_RELEASE_TABLET | Freq: Every day | ORAL | Status: AC

## 2022-05-20 NOTE — Progress Notes (Signed)
Clinical Summary Katelyn Kelly is a 61 y.o.female seen for the following medical problems.   1.CAD - admitted 05/2021 with NSTEMI - 05/2021 echo LVEF 60-65%, grade I dd - 05/2021 95% ramus, received PTCA only.      - at prior visit she reported sharp/pressure midchest. Could occur at rest or with exertion. 7-8/10 in severity. +SOB, panic like feeling. Radiated in bewteen shoulder pain. Would last few minutes. Could awake her from sleep. Last episode was a few weeks ago, has improved with cardiac rehab. Would improve with NG - also has reported left neck pain radiates down into shoulder, arm, and left hand. Tightness/tinging feeling. Not positional. Can last all day long.       -ER visit 04/18/22 with chest pain - trop neg x 2, EKG benign.  - pain improved with xanax - reports no recent issues with significant chest pains.  - compliant with meds   2.DM2 - followed by pcp     3. Hyperlipidemia    Jan 2023 TC 122 TG 125 HDL 53 LDL 44   4. Anxiety/depression  5. Migraine headaches - followed by Dr Daron Offer   Past Medical History:  Diagnosis Date   Anxiety and depression    Bilateral carpal tunnel syndrome    Chronic back pain    Chronic neck pain    Depression    Diabetes mellitus    GERD (gastroesophageal reflux disease)    H/O syncope    Headache    History of panic attacks    Hypertension    IBS (irritable bowel syndrome)    Manic depression (HCC)    Migraine    Panic attack      Allergies  Allergen Reactions   Meprobamate Hives and Swelling   Sinequan [Doxepin Hcl] Anaphylaxis   Tranxene [Clorazepate Dipotassium] Hives and Swelling   Albuterol Other (See Comments)    Patient does not remember the reaction. This was a record provided via [facility]   Haloperidol Other (See Comments)    Patient does not remember the reaction. This was a record provided via [facility]   Sertraline Other (See Comments)    Patient does not remember the reaction.  This was a record provided via [place] Other (See Comments)    Patient does not remember the reaction. This was a record provided via [facility]     Current Outpatient Medications  Medication Sig Dispense Refill   acetaminophen (TYLENOL) 650 MG CR tablet Take 1,300 mg by mouth every 8 (eight) hours as needed for pain.     ALPRAZolam (XANAX) 0.5 MG tablet Take 1 tablet (0.5 mg total) by mouth in the morning, at noon, in the evening, and at bedtime. (Patient taking differently: Take 0.5 mg by mouth 3 (three) times daily.)     aspirin 81 MG EC tablet Take 1 tablet (81 mg total) by mouth daily. Swallow whole. (Patient taking differently: Take 81 mg by mouth at bedtime. Swallow whole.) 90 tablet 2   atorvastatin (LIPITOR) 80 MG tablet Take 1 tablet (80 mg total) by mouth at bedtime. 90 tablet 1   busPIRone (BUSPAR) 7.5 MG tablet Take by mouth See admin instructions. Take 0.5 tablets (3.75 mg total) by mouth 3 times daily for 7 days, THEN 1 tablet (7.5 mg total) 3 times daily for 7 days, THEN 2 tablets (15 mg total) 3 times daily     calcium carbonate (TUMS - DOSED IN MG ELEMENTAL CALCIUM)  500 MG chewable tablet Chew 1,000 mg by mouth daily as needed for indigestion or heartburn.     cyclobenzaprine (FLEXERIL) 5 MG tablet Take 5 mg by mouth 3 (three) times daily as needed for muscle spasms.     insulin lispro (HUMALOG) 100 UNIT/ML KwikPen Inject 2-4 Units into the skin See admin instructions. Inject 2 to 4 units 3 times daily. May inject a 4th dose as needed for high blood sugar     isosorbide mononitrate (IMDUR) 60 MG 24 hr tablet Take 1 tablet (60 mg total) by mouth in the morning and at bedtime. 180 tablet 3   Menthol, Topical Analgesic, (BIOFREEZE) 10 % LIQD Apply 1 application topically daily as needed (pain).     metoprolol tartrate (LOPRESSOR) 25 MG tablet Take 0.5 tablets (12.5 mg total) by mouth 2 (two) times daily. 90 tablet 3   nicotine (NICODERM CQ - DOSED IN  MG/24 HOURS) 21 mg/24hr patch Place 1 patch (21 mg total) onto the skin daily. 28 patch 0   nitroGLYCERIN (NITROSTAT) 0.4 MG SL tablet Place 1 tablet (0.4 mg total) under the tongue every 5 (five) minutes x 3 doses as needed for chest pain. 25 tablet 2   ondansetron (ZOFRAN-ODT) 4 MG disintegrating tablet Take 1 tablet (4 mg total) by mouth every 8 (eight) hours as needed for nausea or vomiting. 30 tablet 2   pramipexole (MIRAPEX) 0.25 MG tablet Take 0.25 mg by mouth at bedtime.     ticagrelor (BRILINTA) 90 MG TABS tablet Take 1 tablet (90 mg total) by mouth 2 (two) times daily. 180 tablet 2   topiramate (TOPAMAX) 50 MG tablet Take 50 mg by mouth 2 (two) times daily.     TRESIBA FLEXTOUCH 100 UNIT/ML SOPN FlexTouch Pen Inject 46 Units into the skin at bedtime.     No current facility-administered medications for this visit.     Past Surgical History:  Procedure Laterality Date   ABDOMINAL HYSTERECTOMY     ABDOMINAL SURGERY     APPENDECTOMY     BIOPSY  09/05/2017   Procedure: BIOPSY;  Surgeon: [name], MD;  Location: [facility];  Service: Endoscopy;;  random colon   BIOPSY  05/01/2018   Procedure: BIOPSY;  Surgeon: [name], MD;  Location: [facility];  Service: Endoscopy;;  gastric biopsy   CARPAL TUNNEL RELEASE Bilateral    CHOLECYSTECTOMY     COLONOSCOPY WITH PROPOFOL N/A 09/05/2017   Procedure: COLONOSCOPY WITH PROPOFOL;  Surgeon: [name], MD; normal TI, 5 small polyps, diverticulosis in the cecum, internal and external hemorrhoids.  Random colon biopsies benign.  Colon polyps were hyperplastic.  Repeat in 2023.   CORONARY BALLOON ANGIOPLASTY N/A 06/28/2021   Procedure: CORONARY BALLOON ANGIOPLASTY;  Surgeon: Sherren Mocha, MD;  Location: [facility];  Service: Cardiovascular;  Laterality: N/A;   DILATION AND CURETTAGE OF UTERUS     ESOPHAGOGASTRODUODENOSCOPY (EGD) WITH PROPOFOL N/A 05/01/2018   Procedure: ESOPHAGOGASTRODUODENOSCOPY (EGD) WITH PROPOFOL;   Surgeon: [name], MD;  normal esophagus, small hiatal hernia, mild gastritis s/p biopsy, normal duodenum.  No H. pylori.    HERNIA REPAIR     INCISIONAL HERNIA REPAIR N/A 11/06/2020   Procedure: HERNIA REPAIR INCISIONAL, PRIMARY REPAIR;  Surgeon: Virl Cagey, MD;  Location: [facility];  Service: General;  Laterality: N/A;   LEFT HEART CATH AND CORONARY ANGIOGRAPHY N/A 06/28/2021   Procedure: LEFT HEART CATH AND CORONARY ANGIOGRAPHY;  Surgeon: Sherren Mocha, MD;  Location:  [facility];  Service: Cardiovascular;  Laterality: N/A;   POLYPECTOMY  09/05/2017   Procedure: POLYPECTOMY;  Surgeon: [name], MD;  Location: [facility];  Service: Endoscopy;;  sigmoid and rectal   TUBAL LIGATION       Allergies  Allergen Reactions   Meprobamate Hives and Swelling   Sinequan [Doxepin Hcl] Anaphylaxis   Tranxene [Clorazepate Dipotassium] Hives and Swelling   Albuterol Other (See Comments)    Patient does not remember the reaction. This was a record provided via [facility]   Haloperidol Other (See Comments)    Patient does not remember the reaction. This was a record provided via [facility]   Sertraline Other (See Comments)    Patient does not remember the reaction. This was a record provided via [facility] Other (See Comments)    Patient does not remember the reaction. This was a record provided via [facility]      Family History  Problem Relation Age of Onset   Diabetes Mother    Stroke Mother    Depression Mother    Colon cancer Father 52       Passed away from colon cancer   Colon cancer Brother 50       Passed away from colon cancer   Bipolar disorder Son      Social History Ms. Peppel reports that she has been smoking cigarettes. She has a 40.00 pack-year smoking history. She has never used smokeless tobacco. Ms. Frankowski reports no history of alcohol use.   Review of Systems CONSTITUTIONAL: No weight loss, fever,  chills, weakness or fatigue.  HEENT: Eyes: No visual loss, blurred vision, double vision or yellow sclerae.No hearing loss, sneezing, congestion, runny nose or sore throat.  SKIN: No rash or itching.  CARDIOVASCULAR: per hpi RESPIRATORY: per hpi GASTROINTESTINAL: No anorexia, nausea, vomiting or diarrhea. No abdominal pain or blood.  GENITOURINARY: No burning on urination, no polyuria NEUROLOGICAL: No headache, dizziness, syncope, paralysis, ataxia, numbness or tingling in the extremities. No change in bowel or bladder control.  MUSCULOSKELETAL: No muscle, back pain, joint pain or stiffness.  LYMPHATICS: No enlarged nodes. No history of splenectomy.  PSYCHIATRIC: No history of depression or anxiety.  ENDOCRINOLOGIC: No reports of sweating, cold or heat intolerance. No polyuria or polydipsia.  Marland Kitchen   Physical Examination Today's Vitals   05/20/22 1033  BP: 118/64  Pulse: 90  SpO2: 96%  Weight: 183 lb (83 kg)  Height: 5' 1.5" (1.562 m)   Body mass index is 34.02 kg/m.  Gen: resting comfortably, no acute distress HEENT: no scleral icterus, pupils equal round and reactive, no palptable cervical adenopathy,  CVL: RRR, no m/r/ gno jvd Resp: Clear to auscultation bilaterally GI: abdomen is soft, non-tender, non-distended, normal bowel sounds, no hepatosplenomegaly MSK: extremities are warm, no edema.  Skin: warm, no rash Neuro:  no focal deficits Psych: appropriate affect   Diagnostic Studies  05/2021 echo IMPRESSIONS     1. Left ventricular ejection fraction, by estimation, is 60 to 65%. The  left ventricle has normal function. The left ventricle has no regional  wall motion abnormalities. Left ventricular diastolic parameters are  consistent with Grade I diastolic  dysfunction (impaired relaxation).   2. Right ventricular systolic function is normal. The right ventricular  size is normal. Tricuspid regurgitation signal is inadequate for assessing  PA pressure.   3. The  mitral valve is normal in structure. No evidence of mitral valve  regurgitation.  No evidence of mitral stenosis.   4. The aortic valve is tricuspid. Aortic valve regurgitation is not  visualized. Mild aortic valve sclerosis is present, with no evidence of  aortic valve stenosis.   5. The inferior vena cava is normal in size with greater than 50%  respiratory variability, suggesting right atrial pressure of 3 mmHg.    05/2021 cath  Ramus lesion is 95% stenosed.   Balloon angioplasty was performed using a BALLOON SAPPHIRE 2.0X12.   Post intervention, there is a 10% residual stenosis.   1.  Single-vessel coronary artery disease with severe thrombotic stenosis at the bifurcation of the intermediate [place], treated successfully with balloon angioplasty using a 2 mm balloon.  Intermediate subbranch occlusion noted following angioplasty, likely from distal embolization 2.  Patent left main, LAD, AV circumflex, and RCA without significant stenosis 3.  Normal LV function evaluated by echo with normal LVEDP   Recommend: Aggressive medical therapy, tobacco cessation, dual antiplatelet therapy with aspirin and ticagrelor as tolerated for 12 months for treatment of ACS/non-STEMI   04/2020 carotid US IMPRESSION: 1. Less than 50% stenosis of the right ICA. 2. Normal appearance of the left ICA. 3. Antegrade flow is noted within both vertebral arteries. 4. There are few mildly enlarged left cervical lymph nodes. These are favored to be reactive in etiology. Correlation with physical exam is recommended.   Assessment and Plan  1.CAD - prior atypical chest pains have resolved - continue aspirin and brillnita, can stop brillinta 06/28/22. If needs to hold brillinta for any procedures ok to do, would hold 5 days before and then resume the day after   2. Hyperlipidemia - she is at goal, continue current meds  3. HTN - at goal, continue current meds       Arnoldo Lenis, M.D.,

## 2022-05-20 NOTE — Patient Instructions (Addendum)
Medication Instructions:  Restart your Aspirin at '81mg'$  daily On 06/28/2022 - stop the Brilinta.  Continue all other medications.     Labwork: none  Testing/Procedures: none  Follow-Up: 6 months   Any Other Special Instructions Will Be Listed Below (If Applicable).   If you need a refill on your cardiac medications before your next appointment, please call your pharmacy.

## 2022-06-15 ENCOUNTER — Telehealth: Payer: Self-pay | Admitting: Cardiology

## 2022-06-15 NOTE — Telephone Encounter (Signed)
Pt c/o medication issue:  1. Name of Medication:   isosorbide mononitrate (IMDUR) 60 MG 24 hr tablet (Expired    ticagrelor (BRILINTA) 90 MG TABS tablet   2. How are you currently taking this medication (dosage and times per day)?   3. Are you having a reaction (difficulty breathing--STAT)? No  4. What is your medication issue? Pt states that she has been having what feels like a " pinching" sensations under her breast. She thinks that it may have something to do with medications. Please advise

## 2022-06-15 NOTE — Telephone Encounter (Signed)
Pt states that she is have a pinching sensation on the left side of her chest at her breast. Denies SOB, N/V. She reports that the pain started last night. She rates pain 3/10. Pt states that she did take a nitro last night and went to sleep. Denies pain is worse with movement.

## 2022-06-16 NOTE — Telephone Encounter (Signed)
Symptoms unclear as to what the cause is, from description not classic angina. Would monitor for any recurrence, particularly any specific exertional symptoms and keep [place] updated  Zandra Abts MD

## 2022-06-16 NOTE — Telephone Encounter (Signed)
Pt notified of Dr. Nelly Laurence note. Pt voiced understanding.

## 2022-07-16 ENCOUNTER — Other Ambulatory Visit: Payer: Self-pay | Admitting: Cardiology

## 2022-08-24 ENCOUNTER — Encounter: Payer: Self-pay | Admitting: Cardiology

## 2022-08-24 NOTE — Telephone Encounter (Signed)
Right arm pain not common for cardiac cause. How long does the pain last? Has it been constant without going away since Saturday? Is it worst with moving the arm or shoulder?    Zandra Abts MD

## 2022-08-25 NOTE — Telephone Encounter (Signed)
Thanks for update. Constant right arm pain lasting hours or days at a time worst with movement fortunately would not be consistent with any heart related cause. Agree with seeing her pcp for evalaution, would not warrant any cardiac testing at this time  Zandra Abts MD

## 2022-08-30 ENCOUNTER — Encounter: Payer: Self-pay | Admitting: *Deleted

## 2022-11-23 ENCOUNTER — Ambulatory Visit: Payer: 59 | Attending: Cardiology

## 2022-11-23 ENCOUNTER — Encounter: Payer: Self-pay | Admitting: Cardiology

## 2022-11-23 ENCOUNTER — Ambulatory Visit: Payer: 59 | Attending: Cardiology | Admitting: Cardiology

## 2022-11-23 VITALS — BP 120/70 | HR 92 | Ht 61.5 in | Wt 185.0 lb

## 2022-11-23 DIAGNOSIS — R002 Palpitations: Secondary | ICD-10-CM

## 2022-11-23 DIAGNOSIS — I10 Essential (primary) hypertension: Secondary | ICD-10-CM

## 2022-11-23 DIAGNOSIS — I2510 Atherosclerotic heart disease of native coronary artery without angina pectoris: Secondary | ICD-10-CM | POA: Diagnosis not present

## 2022-11-23 DIAGNOSIS — E782 Mixed hyperlipidemia: Secondary | ICD-10-CM | POA: Diagnosis not present

## 2022-11-23 NOTE — Progress Notes (Signed)
Clinical Summary Katelyn Kelly is a 62 y.o.female seen for the following medical problems.   1.CAD - admitted 05/2021 with NSTEMI - 05/2021 echo LVEF 60-65%, grade I dd - 05/2021 95% ramus, received PTCA only.        - at prior visit she reported sharp/pressure midchest. Could occur at rest or with exertion. 7-8/10 in severity. +SOB, panic like feeling. Radiated in bewteen shoulder pain. Would last few minutes. Could awake her from sleep. Last episode was a few weeks ago, has improved with cardiac rehab. Would improve with NG - also has reported left neck pain radiates down into shoulder, arm, and left hand. Tightness/tinging feeling. Not positional. Can last all day long.       -ER visit 04/18/22 with chest pain - trop neg x 2, EKG benign.  - pain improved with xanax   -chronic atypical left sided chest pain overall unchanged. Dull pain, can occur at rest or with activity. Can last a second or up to 1 hr. No other assocaited symptoms.    2.DM2 - followed by pcp     3. Hyperlipidemia    Jan 2023 TC 122 TG 125 HDL 53 LDL 44 - compliant with meds   4. Anxiety/depression   5. Migraine headaches - followed by Dr Daron Offer  6. Palpitations - episodes of heartfluttering, typically few a few week lasting several minutes - working to wean caffeine.  Past Medical History:  Diagnosis Date   Anxiety and depression    Bilateral carpal tunnel syndrome    Chronic back pain    Chronic neck pain    Depression    Diabetes mellitus    GERD (gastroesophageal reflux disease)    H/O syncope    Headache    History of panic attacks    Hypertension    IBS (irritable bowel syndrome)    Manic depression (HCC)    Migraine    Panic attack      Allergies  Allergen Reactions   Meprobamate Hives and Swelling   Sinequan [Doxepin Hcl] Anaphylaxis   Tranxene [Clorazepate Dipotassium] Hives and Swelling   Albuterol Other (See Comments)    Patient does not remember the reaction. This was a  record provided via [facility]   Haloperidol Other (See Comments)    Patient does not remember the reaction. This was a record provided via [facility]   Sertraline Other (See Comments)    Patient does not remember the reaction. This was a record provided via [place] Other (See Comments)    Patient does not remember the reaction. This was a record provided via [facility]   Elavil [Amitriptyline] Palpitations     Current Outpatient Medications  Medication Sig Dispense Refill   acetaminophen (TYLENOL) 650 MG CR tablet Take 1,300 mg by mouth every 8 (eight) hours as needed for pain.     ALPRAZolam (XANAX) 0.5 MG tablet Take 1 tablet (0.5 mg total) by mouth in the morning, at noon, in the evening, and at bedtime. (Patient taking differently: Take 0.5 mg by mouth 3 (three) times daily.)     aspirin EC 81 MG tablet Take 1 tablet (81 mg total) by mouth daily. Swallow whole.     atorvastatin (LIPITOR) 80 MG tablet Take 1 tablet (80 mg total) by mouth at bedtime. 90 tablet 1   busPIRone (BUSPAR) 7.5 MG tablet Take by mouth See admin instructions. Take 0.5 tablets (3.75 mg total) by mouth 3 times  daily for 7 days, THEN 1 tablet (7.5 mg total) 3 times daily for 7 days, THEN 2 tablets (15 mg total) 3 times daily     calcium carbonate (TUMS - DOSED IN MG ELEMENTAL CALCIUM) 500 MG chewable tablet Chew 1,000 mg by mouth daily as needed for indigestion or heartburn.     cyclobenzaprine (FLEXERIL) 5 MG tablet Take 5 mg by mouth 3 (three) times daily as needed for muscle spasms.     insulin lispro (HUMALOG) 100 UNIT/ML KwikPen Inject 2-4 Units into the skin See admin instructions. Inject 2 to 4 units 3 times daily. May inject a 4th dose as needed for high blood sugar     isosorbide mononitrate (IMDUR) 60 MG 24 hr tablet Take 1 tablet (60 mg total) by mouth in the morning and at bedtime. 180 tablet 3   Menthol, Topical Analgesic, (BIOFREEZE) 10 % LIQD Apply 1 application  topically daily as needed (pain).     metoprolol tartrate (LOPRESSOR) 25 MG tablet Take 0.5 tablets (12.5 mg total) by mouth 2 (two) times daily. 90 tablet 3   nicotine (NICODERM CQ - DOSED IN MG/24 HOURS) 21 mg/24hr patch Place 1 patch (21 mg total) onto the skin daily. 28 patch 0   nitroGLYCERIN (NITROSTAT) 0.4 MG SL tablet PLACE ONE TABLET UNDER THE TOUNGE EVERY FIVE MINUTES FOR 3 DOSES AS NEEDED FOR CHEST PAINS. 25 tablet 3   ondansetron (ZOFRAN-ODT) 4 MG disintegrating tablet Take 1 tablet (4 mg total) by mouth every 8 (eight) hours as needed for nausea or vomiting. 30 tablet 2   pramipexole (MIRAPEX) 0.25 MG tablet Take 0.25 mg by mouth at bedtime.     topiramate (TOPAMAX) 50 MG tablet Take 50 mg by mouth 2 (two) times daily.     TRESIBA FLEXTOUCH 100 UNIT/ML SOPN FlexTouch Pen Inject 46 Units into the skin at bedtime.     No current facility-administered medications for this visit.     Past Surgical History:  Procedure Laterality Date   ABDOMINAL HYSTERECTOMY     ABDOMINAL SURGERY     APPENDECTOMY     BIOPSY  09/05/2017   Procedure: BIOPSY;  Surgeon: [name], MD;  [facility]: [facility];  Service: Endoscopy;;  random colon   BIOPSY  05/01/2018   Procedure: BIOPSY;  Surgeon: [name], MD;  [facility]: [facility];  Service: Endoscopy;;  gastric biopsy   CARPAL TUNNEL RELEASE Bilateral    CHOLECYSTECTOMY     COLONOSCOPY WITH PROPOFOL N/A 09/05/2017   Procedure: COLONOSCOPY WITH PROPOFOL;  Surgeon: [name], MD; normal TI, 5 small polyps, diverticulosis in the cecum, internal and external hemorrhoids.  Random colon biopsies benign.  Colon polyps were hyperplastic.  Repeat in 2023.   CORONARY BALLOON ANGIOPLASTY N/A 06/28/2021   Procedure: CORONARY BALLOON ANGIOPLASTY;  Surgeon: Sherren Mocha, MD;  Location: [facility];  Service: Cardiovascular;  Laterality: N/A;   DILATION AND CURETTAGE OF UTERUS     ESOPHAGOGASTRODUODENOSCOPY (EGD) WITH PROPOFOL N/A  05/01/2018   Procedure: ESOPHAGOGASTRODUODENOSCOPY (EGD) WITH PROPOFOL;  Surgeon: [name], MD;  normal esophagus, small hiatal hernia, mild gastritis s/p biopsy, normal duodenum.  No H. pylori.    HERNIA REPAIR     INCISIONAL HERNIA REPAIR N/A 11/06/2020   Procedure: HERNIA REPAIR INCISIONAL, PRIMARY REPAIR;  Surgeon: Virl Cagey, MD;  Location: [facility];  Service: General;  Laterality: N/A;   LEFT HEART CATH AND CORONARY ANGIOGRAPHY N/A 06/28/2021   Procedure: LEFT HEART  CATH AND CORONARY ANGIOGRAPHY;  Surgeon: Sherren Mocha, MD;  Location: [facility];  Service: Cardiovascular;  Laterality: N/A;   POLYPECTOMY  09/05/2017   Procedure: POLYPECTOMY;  Surgeon: [name], MD;  Location[facility];  Service: Endoscopy;;  sigmoid and rectal   TUBAL LIGATION       Allergies  Allergen Reactions   Meprobamate Hives and Swelling   Sinequan [Doxepin Hcl] Anaphylaxis   Tranxene [Clorazepate Dipotassium] Hives and Swelling   Albuterol Other (See Comments)    Patient does not remember the reaction. This was a record provided via [facility]   Haloperidol Other (See Comments)    Patient does not remember the reaction. This was a record provided via [facility]   Sertraline Other (See Comments)    Patient does not remember the reaction. This was a record provided via [place] Other (See Comments)    Patient does not remember the reaction. This was a record provided via [facility]   Elavil [Amitriptyline] Palpitations      Family History  Problem Relation Age of Onset   Diabetes Mother    Stroke Mother    Depression Mother    Colon cancer Father 86       Passed away from colon cancer   Colon cancer Brother 74       Passed away from colon cancer   Bipolar disorder Son      Social History Ms. Gayman reports that she has been smoking cigarettes. She has a 40.00 pack-year smoking history. She has never used smokeless  tobacco. Ms. Ton reports no history of alcohol use.   Review of Systems CONSTITUTIONAL: No weight loss, fever, chills, weakness or fatigue.  HEENT: Eyes: No visual loss, blurred vision, double vision or yellow sclerae.No hearing loss, sneezing, congestion, runny nose or sore throat.  SKIN: No rash or itching.  CARDIOVASCULAR: per hpi RESPIRATORY: No shortness of breath, cough or sputum.  GASTROINTESTINAL: No anorexia, nausea, vomiting or diarrhea. No abdominal pain or blood.  GENITOURINARY: No burning on urination, no polyuria NEUROLOGICAL: No headache, dizziness, syncope, paralysis, ataxia, numbness or tingling in the extremities. No change in bowel or bladder control.  MUSCULOSKELETAL: No muscle, back pain, joint pain or stiffness.  LYMPHATICS: No enlarged nodes. No history of splenectomy.  PSYCHIATRIC: No history of depression or anxiety.  ENDOCRINOLOGIC: No reports of sweating, cold or heat intolerance. No polyuria or polydipsia.  Marland Kitchen   Physical Examination Today's Vitals   11/23/22 1047  BP: 120/70  Pulse: 92  SpO2: 95%  Weight: 185 lb (83.9 kg)  Height: 5' 1.5" (1.562 m)   Body mass index is 34.39 kg/m.  Gen: resting comfortably, no acute distress HEENT: no scleral icterus, pupils equal round and reactive, no palptable cervical adenopathy,  CV: RRR, no m/r/g, no jvd Resp: Clear to auscultation bilaterally GI: abdomen is soft, non-tender, non-distended, normal bowel sounds, no hepatosplenomegaly MSK: extremities are warm, no edema.  Skin: warm, no rash Neuro:  no focal deficits Psych: appropriate affect   Diagnostic Studies 05/2021 echo IMPRESSIONS     1. Left ventricular ejection fraction, by estimation, is 60 to 65%. The  left ventricle has normal function. The left ventricle has no regional  wall motion abnormalities. Left ventricular diastolic parameters are  consistent with Grade I diastolic  dysfunction (impaired relaxation).   2. Right ventricular  systolic function is normal. The right ventricular  size is normal. Tricuspid regurgitation signal is inadequate for  assessing  PA pressure.   3. The mitral valve is normal in structure. No evidence of mitral valve  regurgitation. No evidence of mitral stenosis.   4. The aortic valve is tricuspid. Aortic valve regurgitation is not  visualized. Mild aortic valve sclerosis is present, with no evidence of  aortic valve stenosis.   5. The inferior vena cava is normal in size with greater than 50%  respiratory variability, suggesting right atrial pressure of 3 mmHg.    05/2021 cath  Ramus lesion is 95% stenosed.   Balloon angioplasty was performed using a BALLOON SAPPHIRE 2.0X12.   Post intervention, there is a 10% residual stenosis.   1.  Single-vessel coronary artery disease with severe thrombotic stenosis at the bifurcation of the intermediate Katelyn Kelly, treated successfully with balloon angioplasty using a 2 mm balloon.  Intermediate subbranch occlusion noted following angioplasty, likely from distal embolization 2.  Patent left main, LAD, AV circumflex, and RCA without significant stenosis 3.  Normal LV function evaluated by echo with normal LVEDP   Recommend: Aggressive medical therapy, tobacco cessation, dual antiplatelet therapy with aspirin and ticagrelor as tolerated for 12 months for treatment of ACS/non-STEMI   04/2020 carotid US IMPRESSION: 1. Less than 50% stenosis of the right ICA. 2. Normal appearance of the left ICA. 3. Antegrade flow is noted within both vertebral arteries. 4. There are few mildly enlarged left cervical lymph nodes. These are favored to be reactive in etiology. Correlation with physical exam is recommended.      Assessment and Plan   1.CAD - atypical chest pains unchanged - continue current meds   2. Hyperlipidemia - at goal, continue current meds   3. HTN - at goal, she will continue current meds  4. Palpitations - EKG today shows NSR - plan  for 7 day zio patch    Arnoldo Lenis, M.D.

## 2022-11-23 NOTE — Patient Instructions (Addendum)
Medication Instructions:  Your physician recommends that you continue on your current medications as directed. Please refer to the Current Medication list given to you today.   Labwork: None  Testing/Procedures: None  Follow-Up: Follow up with Dr. Harl Bowie in 6 months.   Any Other Special Instructions Will Be Listed Below (If Applicable).     If you need a refill on your cardiac medications before your next appointment, please call your pharmacy.  ZIO XT- Long Term Monitor Instructions   Your physician has requested you wear your ZIO patch monitor___7____days.   This is a single patch monitor.  [employer] supplies one patch monitor per enrollment.  Additional stickers are not available.   Please do not apply patch if you will be having a Nuclear Stress Test, Echocardiogram, Cardiac CT, MRI, or Chest Xray during the time frame you would be wearing the monitor. The patch cannot be worn during these tests.  You cannot remove and re-apply the ZIO XT patch monitor.   Your ZIO patch monitor will be sent USPS Priority mail from [facility] directly to your home address. The monitor may also be mailed to a PO BOX if home delivery is not available.   It may take 3-5 days to receive your monitor after you have been enrolled.   Once you have received you monitor, please review enclosed instructions.  Your monitor has already been registered assigning a specific monitor serial # to you.   Applying the monitor   Shave hair from upper left chest.   Hold abrader disc by orange tab.  Rub abrader in 40 strokes over left upper chest as indicated in your monitor instructions.   Clean area with 4 enclosed alcohol pads .  Use all pads to assure are is cleaned thoroughly.  Let dry.   Apply patch as indicated in monitor instructions.  Patch will be place under collarbone on left side of chest with arrow pointing upward.   Rub patch adhesive wings for 2 minutes.Remove white label marked "1".   Remove white label marked "2".  Rub patch adhesive wings for 2 additional minutes.   While looking in a mirror, press and release button in center of patch.  A small green light will flash 3-4 times .  This will be your only indicator the monitor has been turned on.     Do not shower for the first 24 hours.  You may shower after the first 24 hours.   Press button if you feel a symptom. You will hear a small click.  Record Date, Time and Symptom in the Patient Log Book.   When you are ready to remove patch, follow instructions on last 2 pages of Patient Log Book.  Stick patch monitor onto last page of Patient Log Book.   Place Patient Log Book in [place] box.  Use locking tab on box and tape box closed securely.  The [employer] has [employer] on it.  Please place in mailbox as soon as possible.  Your physician should have your test results approximately 7 days after the monitor has been mailed back to [facility].   Call [place] at [phone] if you have questions regarding your ZIO XT patch monitor.  Call them immediately if you see an orange light blinking on your monitor.   If your monitor falls off in less than 4 days contact our Monitor department at [phone].  If your monitor becomes loose or falls off after 4 days  call [employer] at [phone] for suggestions on securing your monitor.

## 2022-11-24 ENCOUNTER — Telehealth: Payer: Self-pay | Admitting: Cardiology

## 2022-11-24 NOTE — Telephone Encounter (Signed)
Pt c/o of Chest Pain: STAT if CP now or developed within 24 hours  1. Are you having CP right now?   No  2. Are you experiencing any other symptoms (ex. SOB, nausea, vomiting, sweating)? No  3. How long have you been experiencing CP?  A couple of days ago  4. Is your CP continuous or coming and going?   Coming and going  5. Have you taken Nitroglycerin?  No  Patient stated she has some fluttering in the middle of her chest and a dull pressure above her left breast.  Patient would like to know if these episodes should be recorded in her booklet.

## 2022-11-24 NOTE — Telephone Encounter (Signed)
Spoke to pt and clarified when she should press button and record in her booklet. Patient notified and verbalized understanding.

## 2022-12-02 ENCOUNTER — Other Ambulatory Visit (HOSPITAL_COMMUNITY): Payer: Self-pay

## 2022-12-14 ENCOUNTER — Other Ambulatory Visit (HOSPITAL_COMMUNITY): Payer: Self-pay | Admitting: Family Medicine

## 2022-12-14 DIAGNOSIS — M533 Sacrococcygeal disorders, not elsewhere classified: Secondary | ICD-10-CM

## 2022-12-20 ENCOUNTER — Other Ambulatory Visit: Payer: Self-pay | Admitting: Cardiology

## 2022-12-22 ENCOUNTER — Encounter (HOSPITAL_COMMUNITY): Payer: Self-pay

## 2022-12-22 ENCOUNTER — Other Ambulatory Visit (HOSPITAL_COMMUNITY): Payer: Self-pay | Admitting: Family Medicine

## 2022-12-22 ENCOUNTER — Ambulatory Visit (HOSPITAL_COMMUNITY)
Admission: RE | Admit: 2022-12-22 | Discharge: 2022-12-22 | Disposition: A | Discharge status: Home / Self Care | Payer: 59 | Source: Ambulatory Visit | Attending: Family Medicine | Admitting: Family Medicine

## 2022-12-22 DIAGNOSIS — M533 Sacrococcygeal disorders, not elsewhere classified: Secondary | ICD-10-CM | POA: Insufficient documentation

## 2022-12-29 ENCOUNTER — Telehealth: Payer: Self-pay

## 2022-12-29 NOTE — Telephone Encounter (Signed)
Patient notified and verbalized understanding. Patient had no questions at this time. Pt stated that she is not having ongoing palpitations, but would let our office know if she started.

## 2022-12-29 NOTE — Telephone Encounter (Signed)
-----   Message from Arnoldo Lenis, MD sent at 12/28/2022  5:27 PM EST ----- Heart monitor just some occasoinal extra heart beats, no worrisome findings. If ongoing palpitations can increase lopressor to 79m bid  JZandra AbtsMD

## 2023-01-03 ENCOUNTER — Other Ambulatory Visit: Payer: Self-pay | Admitting: Neurosurgery

## 2023-01-03 DIAGNOSIS — M4722 Other spondylosis with radiculopathy, cervical region: Secondary | ICD-10-CM

## 2023-01-22 ENCOUNTER — Ambulatory Visit
Admission: RE | Admit: 2023-01-22 | Discharge: 2023-01-22 | Disposition: A | Discharge status: Home / Self Care | Payer: 59 | Source: Ambulatory Visit | Attending: Neurosurgery | Admitting: Neurosurgery

## 2023-01-22 DIAGNOSIS — M4722 Other spondylosis with radiculopathy, cervical region: Secondary | ICD-10-CM

## 2023-01-23 ENCOUNTER — Encounter: Payer: Self-pay | Admitting: Diagnostic Neuroimaging

## 2023-01-23 ENCOUNTER — Ambulatory Visit (INDEPENDENT_AMBULATORY_CARE_PROVIDER_SITE_OTHER): Payer: 59 | Admitting: Diagnostic Neuroimaging

## 2023-01-23 VITALS — BP 117/69 | HR 83 | Ht 61.0 in | Wt 187.8 lb

## 2023-01-23 DIAGNOSIS — G43109 Migraine with aura, not intractable, without status migrainosus: Secondary | ICD-10-CM

## 2023-01-23 DIAGNOSIS — G4440 Drug-induced headache, not elsewhere classified, not intractable: Secondary | ICD-10-CM | POA: Diagnosis not present

## 2023-01-23 DIAGNOSIS — R519 Headache, unspecified: Secondary | ICD-10-CM

## 2023-01-23 MED ORDER — AJOVY 225 MG/1.5ML ~~LOC~~ SOAJ: 225.0000 mg | SUBCUTANEOUS | 6 refills | Status: DC

## 2023-01-23 MED ORDER — NURTEC 75 MG PO TBDP: 75.0000 mg | ORAL_TABLET | Freq: Every day | ORAL | 6 refills | Status: DC | PRN

## 2023-01-23 NOTE — Progress Notes (Signed)
[facility]  PATIENT: Katelyn Kelly DOB: 23-Oct-1961  REFERRING CLINICIAN: Nickola Major, MD HISTORY FROM: patient  REASON FOR VISIT: new consult   HISTORICAL  CHIEF COMPLAINT:  Chief Complaint  Patient presents with   New Patient (Initial Visit)    Patient in room #7 and alone. Patient states she has pain and numbness from her right shoulder to her fingers tip.    HISTORY OF PRESENT ILLNESS:   62 year old female here for evaluation of migraine headaches.  Patient has had migraines since the 1980s with bilateral temporal, occipital and posterior neck pain, throbbing sensation, hearing sensitivity, sensitive to light and sound, nausea and vomiting.  Symptoms of blurred vision, sometimes seeing squiggly lines.  She has tried topiramate and amitriptyline which caused side effects and did not relieve symptoms.  Was having 2-3 migraines per week, now about once per week.  Also having daily headaches which she uses ibuprofen and Tylenol on daily basis.  Also with significant stress at home related to various factors including her son medical and mental health issues, her financial strain, transportation issues and her own history of traumatic experiences.   REVIEW OF SYSTEMS: Full 14 system review of systems performed and negative with exception of: as per HPI.  ALLERGIES: Allergies  Allergen Reactions   Meprobamate Hives and Swelling   Sinequan [Doxepin Hcl] Anaphylaxis   Tranxene [Clorazepate Dipotassium] Hives and Swelling   Albuterol Other (See Comments)    Patient does not remember the reaction. This was a record provided via [facility]   Haloperidol Other (See Comments)    Patient does not remember the reaction. This was a record provided via [facility]   Sertraline Other (See Comments)    Patient does not remember the reaction. This was a record provided via [facility] Other (See Comments)    Patient does not  remember the reaction. This was a record provided via [facility]   Elavil [Amitriptyline] Palpitations    HOME MEDICATIONS: Outpatient Medications Prior to Visit  Medication Sig Dispense Refill   acetaminophen (TYLENOL) 650 MG CR tablet Take 1,300 mg by mouth every 8 (eight) hours as needed for pain.     ALPRAZolam (XANAX) 0.5 MG tablet Take 1 tablet (0.5 mg total) by mouth in the morning, at noon, in the evening, and at bedtime. (Patient taking differently: Take 0.5 mg by mouth 3 (three) times daily.)     aspirin EC 81 MG tablet Take 1 tablet (81 mg total) by mouth daily. Swallow whole.     atorvastatin (LIPITOR) 80 MG tablet Take 1 tablet (80 mg total) by mouth at bedtime. 90 tablet 1   busPIRone (BUSPAR) 7.5 MG tablet Take by mouth See admin instructions. Take 0.5 tablets (3.75 mg total) by mouth 3 times daily for 7 days, THEN 1 tablet (7.5 mg total) 3 times daily for 7 days, THEN 2 tablets (15 mg total) 3 times daily     calcium carbonate (TUMS - DOSED IN MG ELEMENTAL CALCIUM) 500 MG chewable tablet Chew 1,000 mg by mouth daily as needed for indigestion or heartburn.     cyclobenzaprine (FLEXERIL) 5 MG tablet Take 5 mg by mouth 3 (three) times daily as needed for muscle spasms.     insulin lispro (HUMALOG) 100 UNIT/ML KwikPen Inject 2-4 Units into the skin See admin instructions. Inject 2 to 4 units 3 times daily. May inject a 4th dose as needed for high blood sugar  isosorbide mononitrate (IMDUR) 60 MG 24 hr tablet TAKE 1 TABLET BY MOUTH IN THE MORNING AND AT BEDTIME. 180 tablet 1   Menthol, Topical Analgesic, (BIOFREEZE) 10 % LIQD Apply 1 application topically daily as needed (pain).     metoprolol tartrate (LOPRESSOR) 25 MG tablet TAKE (1/2) TABLET BY MOUTH TWICE DAILY 90 tablet 1   nicotine (NICODERM CQ - DOSED IN MG/24 HOURS) 21 mg/24hr patch Place 1 patch (21 mg total) onto the skin daily. 28 patch 0   nitroGLYCERIN (NITROSTAT) 0.4 MG SL tablet PLACE ONE TABLET UNDER THE TOUNGE  EVERY FIVE MINUTES FOR 3 DOSES AS NEEDED FOR CHEST PAINS. 25 tablet 3   ondansetron (ZOFRAN-ODT) 4 MG disintegrating tablet Take 1 tablet (4 mg total) by mouth every 8 (eight) hours as needed for nausea or vomiting. 30 tablet 2   OZEMPIC, 1 MG/DOSE, 4 MG/3ML SOPN Inject 1 mg into the skin once a week.     pramipexole (MIRAPEX) 0.25 MG tablet Take 0.25 mg by mouth at bedtime.     TRESIBA FLEXTOUCH 100 UNIT/ML SOPN FlexTouch Pen Inject 46 Units into the skin at bedtime.     topiramate (TOPAMAX) 50 MG tablet Take 50 mg by mouth 2 (two) times daily. (Patient not taking: Reported on 01/23/2023)     No facility-administered medications prior to visit.    PAST MEDICAL HISTORY: Past Medical History:  Diagnosis Date   Anxiety and depression    Bilateral carpal tunnel syndrome    Chronic back pain    Chronic neck pain    Depression    Diabetes mellitus    GERD (gastroesophageal reflux disease)    H/O syncope    Headache    History of panic attacks    Hypertension    IBS (irritable bowel syndrome)    Manic depression ([place])    Migraine    Panic attack     PAST SURGICAL HISTORY: Past Surgical History:  Procedure Laterality Date   ABDOMINAL HYSTERECTOMY     ABDOMINAL SURGERY     APPENDECTOMY     BIOPSY  09/05/2017   Procedure: BIOPSY;  Surgeon: [name], MD;  [facility]: [facility];  Service: Endoscopy;;  random colon   BIOPSY  05/01/2018   Procedure: BIOPSY;  Surgeon: [name], MD;  [facility]: [facility];  Service: Endoscopy;;  gastric biopsy   CARPAL TUNNEL RELEASE Bilateral    CHOLECYSTECTOMY     COLONOSCOPY WITH PROPOFOL N/A 09/05/2017   Procedure: COLONOSCOPY WITH PROPOFOL;  Surgeon: [name], MD; normal TI, 5 small polyps, diverticulosis in the cecum, internal and external hemorrhoids.  Random colon biopsies benign.  Colon polyps were hyperplastic.  Repeat in 2023.   CORONARY BALLOON ANGIOPLASTY N/A 06/28/2021   Procedure: CORONARY BALLOON ANGIOPLASTY;  Surgeon:  Sherren Mocha, MD;  Location: [facility];  Service: Cardiovascular;  Laterality: N/A;   DILATION AND CURETTAGE OF UTERUS     ESOPHAGOGASTRODUODENOSCOPY (EGD) WITH PROPOFOL N/A 05/01/2018   Procedure: ESOPHAGOGASTRODUODENOSCOPY (EGD) WITH PROPOFOL;  Surgeon: [name], MD;  normal esophagus, small hiatal hernia, mild gastritis s/p biopsy, normal duodenum.  No H. pylori.    HERNIA REPAIR     INCISIONAL HERNIA REPAIR N/A 11/06/2020   Procedure: HERNIA REPAIR INCISIONAL, PRIMARY REPAIR;  Surgeon: Virl Cagey, MD;  Location: [facility];  Service: General;  Laterality: N/A;   LEFT HEART CATH AND CORONARY ANGIOGRAPHY N/A 06/28/2021   Procedure: LEFT HEART CATH AND CORONARY ANGIOGRAPHY;  Surgeon:  Sherren Mocha, MD;  Location: [facility];  Service: Cardiovascular;  Laterality: N/A;   POLYPECTOMY  09/05/2017   Procedure: POLYPECTOMY;  Surgeon: [name], MD;  [facility];  Service: Endoscopy;;  sigmoid and rectal   TUBAL LIGATION      FAMILY HISTORY: Family History  Problem Relation Age of Onset   Diabetes Mother    Stroke Mother    Depression Mother    Colon cancer Father 26       Passed away from colon cancer   Colon cancer Brother 24       Passed away from colon cancer   Bipolar disorder Son     SOCIAL HISTORY: Social History   Socioeconomic History   Marital status: Legally Separated    Spouse name: Not on file   Number of children: Not on file   Years of education: Not on file   Highest education level: Not on file  Occupational History   Not on file  Tobacco Use   Smoking status: Every Day    Packs/day: 1.00    Years: 40.00    Additional pack years: 0.00    Total pack years: 40.00    Types: Cigarettes   Smokeless tobacco: Never   Tobacco comments:    one pack a day. Pt has purchased nicotine patches. She has a plan to stop and is going to set a quit date.   Vaping Use   Vaping Use: Never used  Substance and Sexual Activity    Alcohol use: No   Drug use: No   Sexual activity: Not Currently    Birth control/protection: Surgical  Other Topics Concern   Not on file  Social History Narrative   Not on file   Social Determinants of Health   Financial Resource Strain: Not on file  Food Insecurity: Not on file  Transportation Needs: Not on file  Physical Activity: Not on file  Stress: Not on file  Social Connections: Not on file  Intimate Partner Violence: Not on file     PHYSICAL EXAM  GENERAL EXAM/CONSTITUTIONAL: Vitals:  Vitals:   01/23/23 0942  BP: 117/69  Pulse: 83  Weight: 187 lb 12.8 oz (85.2 kg)  Height: 5\' 1"  (1.549 m)   Body mass index is 35.48 kg/m. Wt Readings from Last 3 Encounters:  01/23/23 187 lb 12.8 oz (85.2 kg)  11/23/22 185 lb (83.9 kg)  05/20/22 183 lb (83 kg)   Patient is in no distress; well developed, nourished and groomed; neck is supple  CARDIOVASCULAR: Examination of carotid arteries is normal; no carotid bruits Regular rate and rhythm, no murmurs Examination of peripheral vascular system by observation and palpation is normal  EYES: Ophthalmoscopic exam of optic discs and posterior segments is normal; no papilledema or hemorrhages No results found.  MUSCULOSKELETAL: Gait, strength, tone, movements noted in Neurologic exam below  NEUROLOGIC: MENTAL STATUS:      No data to display         awake, alert, oriented to person, place and time recent and remote memory intact normal attention and concentration language fluent, comprehension intact, naming intact fund of knowledge appropriate  CRANIAL NERVE:  2nd - no papilledema on fundoscopic exam 2nd, 3rd, 4th, 6th - pupils equal and reactive to light, visual fields full to confrontation, extraocular muscles intact, no nystagmus 5th - facial sensation symmetric 7th - facial strength symmetric 8th - hearing intact 9th - palate elevates symmetrically, uvula midline 11th - shoulder shrug  symmetric 12th -  tongue protrusion midline  MOTOR:  normal bulk and tone, full strength in the BUE, BLE  SENSORY:  normal and symmetric to light touch, temperature, vibration  COORDINATION:  finger-nose-finger, fine finger movements normal  REFLEXES:  deep tendon reflexes TRACE and symmetric  GAIT/STATION:  narrow based gait     DIAGNOSTIC DATA (LABS, IMAGING, TESTING) - I reviewed patient records, labs, notes, testing and imaging myself where available.  Lab Results  Component Value Date   WBC 16.9 (H) 04/18/2022   HGB 16.2 (H) 04/18/2022   HCT 48.5 (H) 04/18/2022   MCV 94.4 04/18/2022   PLT 235 04/18/2022      Component Value Date/Time   NA 140 04/18/2022 0039   K 3.5 04/18/2022 0039   CL 111 04/18/2022 0039   CO2 21 (L) 04/18/2022 0039   GLUCOSE 151 (H) 04/18/2022 0039   BUN 16 04/18/2022 0039   CREATININE 1.13 (H) 04/18/2022 0039   CALCIUM 8.9 04/18/2022 0039   PROT 7.3 10/08/2020 1220   ALBUMIN 3.6 10/08/2020 1220   AST 17 10/08/2020 1220   ALT 17 10/08/2020 1220   ALKPHOS 124 10/08/2020 1220   BILITOT 0.6 10/08/2020 1220   GFRNONAA 55 (L) 04/18/2022 0039   GFRAA >60 11/18/2018 2221   Lab Results  Component Value Date   CHOL 122 11/15/2021   HDL 53 11/15/2021   LDLCALC 44 11/15/2021   TRIG 125 11/15/2021   CHOLHDL 2.3 11/15/2021   Lab Results  Component Value Date   HGBA1C 10.4 (H) 06/26/2021   No results found for: "VITAMINB12" Lab Results  Component Value Date   TSH 1.518 06/26/2021    11/24/21 MRI cervical spine  1. Diffuse cervical spine spondylosis as described above. 2.  No acute osseous injury of the cervical spine.    ASSESSMENT AND PLAN  62 y.o. year old female here with:   Dx:  1. Migraine with aura and without status migrainosus, not intractable   2. Medication overuse headache   3. Chronic daily headache      PLAN:  MIGRAINE WITH AURA (~8-12 per month)  MIGRAINE TREATMENT PLAN:  MIGRAINE PREVENTION  LIFESTYLE CHANGES -Stop  or avoid smoking -Decrease or avoid caffeine / alcohol -Eat and sleep on a regular schedule -Exercise several times per week TRIED: topiramate (not effective), amitriptyline (side effects) - start fremanezumab (Ajovy) 225mg  monthly injection  MIGRAINE RESCUE  - ibuprofen, tylenol as needed (limit to 5-10 doses per month) - TRIPTANS (cannot take due to history of heart attack) - trial of rimegepant (Nurtec) 75mg  as needed for breakthrough headache; max 8 per month  MEDICATION OVERUSE HEADACHE - ibuprofen, tylenol as needed (limit to 5-10 doses per month)  NECK PAIN / DEGENERATIVE SPINE DZ / RIGHT ARM PAIN - follow up per Dr. Cyndy Freeze (neurosurgery)  Meds ordered this encounter  Medications   Fremanezumab-vfrm (AJOVY) 225 MG/1.5ML SOAJ    Sig: Inject 225 mg into the skin every 30 (thirty) days.    Dispense:  1.68 mL    Refill:  6   Rimegepant Sulfate (NURTEC) 75 MG TBDP    Sig: Take 1 tablet (75 mg total) by mouth daily as needed.    Dispense:  8 tablet    Refill:  6   Return in about 6 months (around 07/26/2023) for with NP.    Penni Bombard, MD [id number], [id number] AM Certified in Neurology, Neurophysiology and [facility] [address], [place], [place] [address] [phone]  [phone]  

## 2023-01-23 NOTE — Patient Instructions (Signed)
MIGRAINE PREVENTION  LIFESTYLE CHANGES -Stop or avoid smoking -Decrease or avoid caffeine / alcohol -Eat and sleep on a regular schedule -Exercise several times per week TRIED: topiramate (not effective), amitriptyline (side effects) - start fremanezumab (Ajovy) 225mg  monthly injection   MIGRAINE RESCUE  - ibuprofen, tylenol as needed (limit to 5-10 doses per month) - TRIPTANS (cannot take due to history of heart attack) - trial of rimegepant (Nurtec) 75mg  as needed for breakthrough headache; max 8 per month  MEDICATION OVERUSE HEADACHE - ibuprofen, tylenol as needed (limit to 5-10 doses per month)

## 2023-01-24 ENCOUNTER — Telehealth: Payer: Self-pay

## 2023-01-24 NOTE — Telephone Encounter (Signed)
Sent PA to [facility] Key: [id number] Waiting on response.

## 2023-01-24 NOTE — Telephone Encounter (Signed)
Sent PA to [facility] Key: [id number] Waiting for response.

## 2023-02-22 ENCOUNTER — Encounter: Payer: Self-pay | Admitting: Emergency Medicine

## 2023-02-22 ENCOUNTER — Emergency Department (HOSPITAL_COMMUNITY): Payer: 59

## 2023-02-22 ENCOUNTER — Emergency Department (HOSPITAL_COMMUNITY)
Admission: EM | Admit: 2023-02-22 | Discharge: 2023-02-22 | Disposition: A | Discharge status: Home / Self Care | Payer: 59 | Attending: Emergency Medicine | Admitting: Emergency Medicine

## 2023-02-22 ENCOUNTER — Encounter (HOSPITAL_COMMUNITY): Payer: Self-pay | Admitting: *Deleted

## 2023-02-22 ENCOUNTER — Ambulatory Visit
Admission: EM | Admit: 2023-02-22 | Discharge: 2023-02-22 | Disposition: A | Discharge status: Home / Self Care | Payer: 59 | Attending: Family Medicine | Admitting: Family Medicine

## 2023-02-22 ENCOUNTER — Other Ambulatory Visit: Payer: Self-pay

## 2023-02-22 DIAGNOSIS — R1032 Left lower quadrant pain: Secondary | ICD-10-CM

## 2023-02-22 DIAGNOSIS — I10 Essential (primary) hypertension: Secondary | ICD-10-CM | POA: Diagnosis not present

## 2023-02-22 DIAGNOSIS — R109 Unspecified abdominal pain: Secondary | ICD-10-CM

## 2023-02-22 DIAGNOSIS — E119 Type 2 diabetes mellitus without complications: Secondary | ICD-10-CM | POA: Insufficient documentation

## 2023-02-22 DIAGNOSIS — Z794 Long term (current) use of insulin: Secondary | ICD-10-CM | POA: Diagnosis not present

## 2023-02-22 DIAGNOSIS — Z79899 Other long term (current) drug therapy: Secondary | ICD-10-CM | POA: Insufficient documentation

## 2023-02-22 DIAGNOSIS — D72829 Elevated white blood cell count, unspecified: Secondary | ICD-10-CM | POA: Diagnosis not present

## 2023-02-22 DIAGNOSIS — M545 Low back pain, unspecified: Secondary | ICD-10-CM | POA: Diagnosis not present

## 2023-02-22 DIAGNOSIS — R112 Nausea with vomiting, unspecified: Secondary | ICD-10-CM

## 2023-02-22 DIAGNOSIS — Z7982 Long term (current) use of aspirin: Secondary | ICD-10-CM | POA: Insufficient documentation

## 2023-02-22 DIAGNOSIS — I2510 Atherosclerotic heart disease of native coronary artery without angina pectoris: Secondary | ICD-10-CM | POA: Insufficient documentation

## 2023-02-22 DIAGNOSIS — Z87891 Personal history of nicotine dependence: Secondary | ICD-10-CM | POA: Diagnosis not present

## 2023-02-22 LAB — URINALYSIS, ROUTINE W REFLEX MICROSCOPIC
Bacteria, UA: NONE SEEN
Bilirubin Urine: NEGATIVE
Glucose, UA: NEGATIVE mg/dL
Ketones, ur: NEGATIVE mg/dL
Leukocytes,Ua: NEGATIVE
Nitrite: NEGATIVE
Protein, ur: NEGATIVE mg/dL
Specific Gravity, Urine: 1.008 (ref 1.005–1.030)
pH: 5 (ref 5.0–8.0)

## 2023-02-22 LAB — POCT URINALYSIS DIP (MANUAL ENTRY)
Bilirubin, UA: NEGATIVE
Blood, UA: NEGATIVE
Glucose, UA: NEGATIVE mg/dL
Ketones, POC UA: NEGATIVE mg/dL
Leukocytes, UA: NEGATIVE
Nitrite, UA: NEGATIVE
Protein Ur, POC: NEGATIVE mg/dL
Spec Grav, UA: 1.015 (ref 1.010–1.025)
Urobilinogen, UA: 0.2 E.U./dL
pH, UA: 5.5 (ref 5.0–8.0)

## 2023-02-22 LAB — COMPREHENSIVE METABOLIC PANEL
ALT: 18 U/L (ref 0–44)
AST: 13 U/L — ABNORMAL LOW (ref 15–41)
Albumin: 3.7 g/dL (ref 3.5–5.0)
Alkaline Phosphatase: 89 U/L (ref 38–126)
Anion gap: 7 (ref 5–15)
BUN: 14 mg/dL (ref 8–23)
CO2: 26 mmol/L (ref 22–32)
Calcium: 8.8 mg/dL — ABNORMAL LOW (ref 8.9–10.3)
Chloride: 106 mmol/L (ref 98–111)
Creatinine, Ser: 1.03 mg/dL — ABNORMAL HIGH (ref 0.44–1.00)
GFR, Estimated: >60 mL/min (ref >60)
Glucose, Bld: 121 mg/dL — ABNORMAL HIGH (ref 70–99)
Potassium: 4.2 mmol/L (ref 3.5–5.1)
Sodium: 139 mmol/L (ref 135–145)
Total Bilirubin: 0.6 mg/dL (ref 0.3–1.2)
Total Protein: 7.1 g/dL (ref 6.5–8.1)

## 2023-02-22 LAB — CBC
HCT: 51.1 % — ABNORMAL HIGH (ref 36.0–46.0)
Hemoglobin: 16.7 g/dL — ABNORMAL HIGH (ref 12.0–15.0)
MCH: 31.2 pg (ref 26.0–34.0)
MCHC: 32.7 g/dL (ref 30.0–36.0)
MCV: 95.5 fL (ref 80.0–100.0)
Platelets: 224 10*3/uL (ref 150–400)
RBC: 5.35 MIL/uL — ABNORMAL HIGH (ref 3.87–5.11)
RDW: 13.1 % (ref 11.5–15.5)
WBC: 13.3 10*3/uL — ABNORMAL HIGH (ref 4.0–10.5)
nRBC: 0 % (ref 0.0–0.2)

## 2023-02-22 LAB — LIPASE, BLOOD: Lipase: 53 U/L — ABNORMAL HIGH (ref 11–51)

## 2023-02-22 MED ORDER — IOHEXOL 300 MG/ML  SOLN
100.0000 mL | Freq: Once | INTRAMUSCULAR | Status: AC | PRN
  Administered 2023-02-22: 100 mL via INTRAVENOUS

## 2023-02-22 MED ORDER — MORPHINE SULFATE (PF) 4 MG/ML IV SOLN
4.0000 mg | Freq: Once | INTRAVENOUS | Status: AC
  Administered 2023-02-22: 4 mg via INTRAVENOUS
  Filled 2023-02-22: qty 1

## 2023-02-22 MED ORDER — METHYLPREDNISOLONE 4 MG PO TBPK: ORAL_TABLET | ORAL | 0 refills | Status: DC

## 2023-02-22 MED ORDER — HYDROCODONE-ACETAMINOPHEN 5-325 MG PO TABS: 1.0000 | ORAL_TABLET | ORAL | 0 refills | Status: DC | PRN

## 2023-02-22 MED ORDER — OXYCODONE-ACETAMINOPHEN 5-325 MG PO TABS
2.0000 | ORAL_TABLET | Freq: Once | ORAL | Status: AC
  Administered 2023-02-22: 2 via ORAL
  Filled 2023-02-22: qty 2

## 2023-02-22 NOTE — Discharge Instructions (Addendum)
You were seen in the ER for left sided back and abdominal pain.  Your blood work was reassuring. Your CT scan showed some inflammation of your intestine lining (enteritis), some wall thickening of your bladder (could be infection but urine sample was normal), and some stenosis in your lower spine.  I am giving you a short course of pain medication and some steroids. As we discussed, make sure you are monitoring your blood sugar carefully while on steroids. Discontinue the steroids if you cannot manage your blood sugar with your home medicine.   Continue to monitor how you're doing and return to the ER for new or worsening symptoms.

## 2023-02-22 NOTE — ED Notes (Signed)
Patient transported to CT 

## 2023-02-22 NOTE — ED Provider Notes (Signed)
[place] EMERGENCY DEPARTMENT AT [facility] Provider Note   CSN: [id number] Arrival date & time: 02/22/23  1200     History {Add pertinent medical, surgical, social history, OB history to HPI:1} Chief Complaint  Patient presents with   Flank Pain    Katelyn Kelly is a 62 y.o. female with history of HLD, T2DM, tobacco use, CAD, IBS, anxiety, depression, chronic back and neck pain, GERD, HTN who presents to the ER complaining of abdominal and flank pain. States this has been intermittent for "a while" but got worse in the last 3 days. Went to UC and sent here for further evaluation. Has had some associated nausea and vomiting. No diarrhea or bloody stools. Has tried ibuprofen without significant relief. Feels very anxious and scared about what is causing her symptoms.    Flank Pain Associated symptoms include abdominal pain.       Home Medications Prior to Admission medications   Medication Sig Start Date End Date Taking? Authorizing Provider  acetaminophen (TYLENOL) 650 MG CR tablet Take 1,300 mg by mouth every 8 (eight) hours as needed for pain.    [provider]  ALPRAZolam Prudy Feeler) 0.5 MG tablet Take 1 tablet (0.5 mg total) by mouth in the morning, at noon, in the evening, and at bedtime. Patient taking differently: Take 0.5 mg by mouth 3 (three) times daily. 06/29/21   Arty Baumgartner, NP  aspirin EC 81 MG tablet Take 1 tablet (81 mg total) by mouth daily. Swallow whole. 05/20/22   Antoine Poche, MD  atorvastatin (LIPITOR) 80 MG tablet Take 1 tablet (80 mg total) by mouth at bedtime. 06/29/21   Arty Baumgartner, NP  busPIRone (BUSPAR) 7.5 MG tablet Take by mouth See admin instructions. Take 0.5 tablets (3.75 mg total) by mouth 3 times daily for 7 days, THEN 1 tablet (7.5 mg total) 3 times daily for 7 days, THEN 2 tablets (15 mg total) 3 times daily 06/22/21   [provider]  calcium carbonate (TUMS - DOSED IN MG ELEMENTAL CALCIUM) 500 MG  chewable tablet Chew 1,000 mg by mouth daily as needed for indigestion or heartburn.    [provider]  cyclobenzaprine (FLEXERIL) 5 MG tablet Take 5 mg by mouth 3 (three) times daily as needed for muscle spasms. 06/02/21   [provider]  Fremanezumab-vfrm (AJOVY) 225 MG/1.5ML SOAJ Inject 225 mg into the skin every 30 (thirty) days. 01/23/23   Penumalli, Glenford Bayley, MD  insulin lispro (HUMALOG) 100 UNIT/ML KwikPen Inject 2-4 Units into the skin See admin instructions. Inject 2 to 4 units 3 times daily. May inject a 4th dose as needed for high blood sugar 03/09/19   [provider]  isosorbide mononitrate (IMDUR) 60 MG 24 hr tablet TAKE 1 TABLET BY MOUTH IN THE MORNING AND AT BEDTIME. 12/20/22   Antoine Poche, MD  Menthol, Topical Analgesic, (BIOFREEZE) 10 % LIQD Apply 1 application topically daily as needed (pain).    [provider]  metoprolol tartrate (LOPRESSOR) 25 MG tablet TAKE (1/2) TABLET BY MOUTH TWICE DAILY 12/20/22   Antoine Poche, MD  nicotine (NICODERM CQ - DOSED IN MG/24 HOURS) 21 mg/24hr patch Place 1 patch (21 mg total) onto the skin daily. 06/30/21   Arty Baumgartner, NP  nitroGLYCERIN (NITROSTAT) 0.4 MG SL tablet PLACE ONE TABLET UNDER THE TOUNGE EVERY FIVE MINUTES FOR 3 DOSES AS NEEDED FOR CHEST PAINS. 07/18/22   Antoine Poche, MD  ondansetron (ZOFRAN-ODT) 4  MG disintegrating tablet Take 1 tablet (4 mg total) by mouth every 8 (eight) hours as needed for nausea or vomiting. 05/28/20   Ermalinda Memos S, PA-C  OZEMPIC, 1 MG/DOSE, 4 MG/3ML SOPN Inject 1 mg into the skin once a week. 10/25/22 04/18/23  [provider]  pramipexole (MIRAPEX) 0.25 MG tablet Take 0.25 mg by mouth at bedtime. 05/01/21   [provider]  Rimegepant Sulfate (NURTEC) 75 MG TBDP Take 1 tablet (75 mg total) by mouth daily as needed. 01/23/23   Penumalli, Glenford Bayley, MD  TRESIBA FLEXTOUCH 100 UNIT/ML SOPN FlexTouch Pen Inject 46 Units into the skin at bedtime.  03/20/19   [provider]      Allergies    Meprobamate, Sinequan [doxepin hcl], Tranxene [clorazepate dipotassium], Albuterol, Haloperidol, Sertraline, Vortioxetine, and Elavil [amitriptyline]    Review of Systems   Review of Systems  Gastrointestinal:  Positive for abdominal pain, nausea and vomiting. Negative for diarrhea.  Genitourinary:  Positive for flank pain. Negative for dysuria and hematuria.    Physical Exam Updated Vital Signs BP (!) 140/57   Pulse 97   Temp 97.8 F (36.6 C) (Oral)   Resp 18   Ht  (1.549 m)   Wt 97.5 kg   SpO2 94%   BMI 40.62 kg/m  Physical Exam  ED Results / Procedures / Treatments   Labs (all labs ordered are listed, but only abnormal results are displayed) Labs Reviewed  LIPASE, BLOOD  COMPREHENSIVE METABOLIC PANEL  CBC    EKG None  Radiology No results found.  Procedures Procedures  {Document cardiac monitor, telemetry assessment procedure when appropriate:1}  Medications Ordered in ED Medications - No data to display  ED Course/ Medical Decision Making/ A&P   {   Click here for ABCD2, HEART and other calculatorsREFRESH Note before signing :1}                          Medical Decision Making Amount and/or Complexity of Data Reviewed Labs: ordered.   This patient is a 62 y.o. female  who presents to the ED for concern of LLQ abdominal and left flank pain.   Differential diagnoses prior to evaluation: The emergent differential diagnosis includes, but is not limited to,  AAA, mesenteric ischemia, appendicitis, diverticulitis, DKA, gastroenteritis, nephrolithiasis, pancreatitis, constipation, UTI, bowel obstruction, biliary disease, IBD, PUD, hepatitis, PID. This is not an exhaustive differential.   Past Medical History / Co-morbidities:  HLD, T2DM, tobacco use, CAD, IBS, anxiety, depression, chronic back and neck pain, GERD, HTN  Additional history: Chart reviewed. Pertinent results include: Reviewed urgent  care note from earlier today. Normal urinalysis. Recommended to go to ER for further evaluation.   Physical Exam: Physical exam performed. The pertinent findings include: ***  Lab Tests/Imaging studies: I personally interpreted labs/imaging and the pertinent results include:  leukocytosis of 13.3, hemoglobin at baseline at 16.7. Creatinine at baseline, normal electrolytes, normal liver function. Lipase 53. Reviewed urinalysis from UC this morning, not needing repeat at this time.Marland KitchenI agree with the radiologist interpretation.  Cardiac monitoring: EKG obtained and interpreted by my attending physician which shows: ***   Medications: I ordered medication including ***.  I have reviewed the patients home medicines and have made adjustments as needed.   Disposition: After consideration of the diagnostic results and the patients response to treatment, I feel that *** .   ***emergency department workup does not suggest an emergent condition requiring  admission or immediate intervention beyond what has been performed at this time. The plan is: ***. The patient is safe for discharge and has been instructed to return immediately for worsening symptoms, change in symptoms or any other concerns.  Final Clinical Impression(s) / ED Diagnoses Final diagnoses:  None    Rx / DC Orders ED Discharge Orders     None      Portions of this report may have been transcribed using voice recognition software. Every effort was made to ensure accuracy; however, inadvertent computerized transcription errors may be present.

## 2023-02-22 NOTE — ED Triage Notes (Signed)
Burning on urination and odor to urine.  Left lower back pain x a few days.

## 2023-02-22 NOTE — ED Triage Notes (Signed)
Pt with LLQ and left flank pain off and on for "awhile" but last 3 days worse.  Denies any diarrhea but admits to having N/V.

## 2023-02-22 NOTE — ED Notes (Signed)
Patient is being discharged from the Urgent Care and sent to the Emergency Department via POV . Per Mardella Layman M.D, patient is in need of higher level of care due to left flank pain. Patient is aware and verbalizes understanding of plan of care.  Vitals:   02/22/23 1127  BP: 104/61  Pulse: (!) 102  Resp: 18  Temp: 98.5 F (36.9 C)  SpO2: 93%

## 2023-02-22 NOTE — ED Provider Notes (Signed)
[facility]   [id number] 02/22/23 Arrival Time: 1116  ASSESSMENT & PLAN:  1. Abdominal pain, left lower quadrant   2. Left flank pain   3. Nausea and vomiting, unspecified vomiting type    She is quite tender over left lower abdomen and left side into left flank. Unclear etiology but worsening over past two days. With associated n/v now. Denies diarrhea or bloody stools. Afebrile. Limited resources here; will need further investigation; to ED via POV; stable upon discharge. U/A normal here.  Results for orders placed or performed during the hospital encounter of 02/22/23  POCT urinalysis dipstick  Result Value Ref Range   Color, UA yellow yellow   Clarity, UA clear clear   Glucose, UA negative negative mg/dL   Bilirubin, UA negative negative   Ketones, POC UA negative negative mg/dL   Spec Grav, UA 4.098 1.191 - 1.025   Blood, UA negative negative   pH, UA 5.5 5.0 - 8.0   Protein Ur, POC negative negative mg/dL   Urobilinogen, UA 0.2 0.2 or 1.0 E.U./dL   Nitrite, UA Negative Negative   Leukocytes, UA Negative Negative    Follow-up Information     Go to  [facility] Emergency Department at [facility].   Specialty: Emergency Medicine Contact information: [address] [id number] [address] [place] [address] [phone]               Reviewed expectations re: course of current medical issues. Questions answered. Outlined signs and symptoms indicating need for more acute intervention. Patient verbalized understanding. After Visit Summary given.   SUBJECTIVE: History from: patient. Katelyn Kelly is a 62 y.o. female who presents with complaint of left flank/side/abdomen pain; feels this has been present for a month or two but has become much worse over the past couple of days. Pain with movement also. Fairly persistent over past couple of days with associated non-bilious, non-bloody emesis. Denies diarrhea. Denies fever. Last PO  intake yesterday evening; none today. No appetite. Is ambulatory here. Does describe dysuria.  No tx PTA.  No LMP recorded. Patient has had a hysterectomy.  H/O appendectomy and cholecystectomy.  Past Surgical History:  Procedure Laterality Date   ABDOMINAL HYSTERECTOMY     ABDOMINAL SURGERY     APPENDECTOMY     BIOPSY  09/05/2017   Procedure: BIOPSY;  Surgeon: West Bali, MD;  Location: [facility];  Service: Endoscopy;;  random colon   BIOPSY  05/01/2018   Procedure: BIOPSY;  Surgeon: West Bali, MD;  Location: [facility];  Service: Endoscopy;;  gastric biopsy   CARPAL TUNNEL RELEASE Bilateral    CHOLECYSTECTOMY     COLONOSCOPY WITH PROPOFOL N/A 09/05/2017   Procedure: COLONOSCOPY WITH PROPOFOL;  Surgeon: West Bali, MD; normal TI, 5 small polyps, diverticulosis in the cecum, internal and external hemorrhoids.  Random colon biopsies benign.  Colon polyps were hyperplastic.  Repeat in 2023.   CORONARY BALLOON ANGIOPLASTY N/A 06/28/2021   Procedure: CORONARY BALLOON ANGIOPLASTY;  Surgeon: Tonny Bollman, MD;  Location: [facility] INVASIVE CV LAB;  Service: Cardiovascular;  Laterality: N/A;   DILATION AND CURETTAGE OF UTERUS     ESOPHAGOGASTRODUODENOSCOPY (EGD) WITH PROPOFOL N/A 05/01/2018   Procedure: ESOPHAGOGASTRODUODENOSCOPY (EGD) WITH PROPOFOL;  Surgeon: West Bali, MD;  normal esophagus, small hiatal hernia, mild gastritis s/p biopsy, normal duodenum.  No H. pylori.    HERNIA REPAIR     INCISIONAL HERNIA REPAIR N/A 11/06/2020   Procedure: HERNIA REPAIR  INCISIONAL, PRIMARY REPAIR;  Surgeon: Lucretia Roers, MD;  Location: [facility] OR[facility];  Service: General;  Laterality: N/A;   LEFT HEART CATH AND CORONARY ANGIOGRAPHY N/A 06/28/2021   Procedure: LEFT HEART CATH AND CORONARY ANGIOGRAPHY;  Surgeon: Tonny Bollman, MD;  Location: [facility] INVASIVE CV LAB;  Service: Cardiovascular;  Laterality: N/A;   POLYPECTOMY  09/05/2017   Procedure: POLYPECTOMY;  Surgeon: West Bali, MD;   Location: [facility];  Service: Endoscopy;;  sigmoid and rectal   TUBAL LIGATION     OBJECTIVE:  Vitals:   02/22/23 1127  BP: 104/61  Pulse: (!) 102  Resp: 18  Temp: 98.5 F (36.9 C)  TempSrc: Oral  SpO2: 93%    HR noted. General appearance: alert, oriented, no acute distress but appears uncomfortable HEENT: [place]; AT; oropharynx moist Lungs: unlabored respirations Abdomen: obese; soft; without distention; significant TTP over LEFT flank, mid-axillary abdomen, and lower abdomen; without masses or organomegaly; without frank guarding but she does pull away quickly with abdominal palpation Back: with LEFT CVA tenderness; FROM at waist Extremities: without LE edema; symmetrical; without gross deformities Skin: warm and dry Neurologic: normal gait Psychological: alert and cooperative; normal mood and affect  Labs: Results for orders placed or performed during the hospital encounter of 02/22/23  POCT urinalysis dipstick  Result Value Ref Range   Color, UA yellow yellow   Clarity, UA clear clear   Glucose, UA negative negative mg/dL   Bilirubin, UA negative negative   Ketones, POC UA negative negative mg/dL   Spec Grav, UA 1.610 9.604 - 1.025   Blood, UA negative negative   pH, UA 5.5 5.0 - 8.0   Protein Ur, POC negative negative mg/dL   Urobilinogen, UA 0.2 0.2 or 1.0 E.U./dL   Nitrite, UA Negative Negative   Leukocytes, UA Negative Negative   Labs Reviewed  POCT URINALYSIS DIP (MANUAL ENTRY)    Allergies  Allergen Reactions   Meprobamate Hives and Swelling   Sinequan [Doxepin Hcl] Anaphylaxis   Tranxene [Clorazepate Dipotassium] Hives and Swelling   Albuterol Other (See Comments)    Patient does not remember the reaction. This was a record provided via [facility]   Haloperidol Other (See Comments)    Patient does not remember the reaction. This was a record provided via [facility]   Sertraline Other (See Comments)    Patient does not remember the  reaction. This was a record provided via [facility]   Vortioxetine Other (See Comments)    Patient does not remember the reaction. This was a record provided via [facility]   Elavil [Amitriptyline] Palpitations                                               Past Medical History:  Diagnosis Date   Anxiety and depression    Bilateral carpal tunnel syndrome    Chronic back pain    Chronic neck pain    Depression    Diabetes mellitus    GERD (gastroesophageal reflux disease)    H/O syncope    Headache    History of panic attacks    Hypertension    IBS (irritable bowel syndrome)    Manic depression    Migraine    Panic attack     Social History   Socioeconomic History   Marital status: Legally Separated  Spouse name: Not on file   Number of children: Not on file   Years of education: Not on file   Highest education level: Not on file  Occupational History   Not on file  Tobacco Use   Smoking status: Every Day    Packs/day: 1.00    Years: 40.00    Additional pack years: 0.00    Total pack years: 40.00    Types: Cigarettes   Smokeless tobacco: Never   Tobacco comments:    one pack a day. Pt has purchased nicotine patches. She has a plan to stop and is going to set a quit date.   Vaping Use   Vaping Use: Never used  Substance and Sexual Activity   Alcohol use: No   Drug use: No   Sexual activity: Not Currently    Birth control/protection: Surgical  Other Topics Concern   Not on file  Social History Narrative   Not on file   Social Determinants of Health   Financial Resource Strain: Not on file  Food Insecurity: Not on file  Transportation Needs: Not on file  Physical Activity: Not on file  Stress: Not on file  Social Connections: Not on file  Intimate Partner Violence: Not on file    Family History  Problem Relation Age of Onset   Diabetes Mother    Stroke Mother    Depression Mother    Colon cancer Father 18       Passed away from colon  cancer   Colon cancer Brother 46       Passed away from colon cancer   Bipolar disorder Son      Mardella Layman, MD 02/22/23 1200

## 2023-03-13 ENCOUNTER — Emergency Department (HOSPITAL_COMMUNITY): Payer: 59

## 2023-03-13 ENCOUNTER — Other Ambulatory Visit: Payer: Self-pay

## 2023-03-13 ENCOUNTER — Emergency Department (HOSPITAL_COMMUNITY)
Admission: EM | Admit: 2023-03-13 | Discharge: 2023-03-13 | Disposition: A | Discharge status: Home / Self Care | Payer: 59 | Attending: Emergency Medicine | Admitting: Emergency Medicine

## 2023-03-13 DIAGNOSIS — R1032 Left lower quadrant pain: Secondary | ICD-10-CM | POA: Diagnosis present

## 2023-03-13 DIAGNOSIS — M545 Low back pain, unspecified: Secondary | ICD-10-CM | POA: Diagnosis not present

## 2023-03-13 DIAGNOSIS — R0602 Shortness of breath: Secondary | ICD-10-CM | POA: Diagnosis not present

## 2023-03-13 DIAGNOSIS — R109 Unspecified abdominal pain: Secondary | ICD-10-CM

## 2023-03-13 DIAGNOSIS — Z79899 Other long term (current) drug therapy: Secondary | ICD-10-CM | POA: Diagnosis not present

## 2023-03-13 DIAGNOSIS — Z7982 Long term (current) use of aspirin: Secondary | ICD-10-CM | POA: Insufficient documentation

## 2023-03-13 DIAGNOSIS — Z794 Long term (current) use of insulin: Secondary | ICD-10-CM | POA: Insufficient documentation

## 2023-03-13 DIAGNOSIS — R000 Tachycardia, unspecified: Secondary | ICD-10-CM | POA: Insufficient documentation

## 2023-03-13 DIAGNOSIS — R7309 Other abnormal glucose: Secondary | ICD-10-CM | POA: Diagnosis not present

## 2023-03-13 LAB — CBC WITH DIFFERENTIAL/PLATELET
Abs Immature Granulocytes: 0.05 10*3/uL (ref 0.00–0.07)
Basophils Absolute: 0.1 10*3/uL (ref 0.0–0.1)
Basophils Relative: 1 %
Eosinophils Absolute: 0.2 10*3/uL (ref 0.0–0.5)
Eosinophils Relative: 1 %
HCT: 48.7 % — ABNORMAL HIGH (ref 36.0–46.0)
Hemoglobin: 16.5 g/dL — ABNORMAL HIGH (ref 12.0–15.0)
Immature Granulocytes: 0 %
Lymphocytes Relative: 25 %
Lymphs Abs: 3.7 10*3/uL (ref 0.7–4.0)
MCH: 31.7 pg (ref 26.0–34.0)
MCHC: 33.9 g/dL (ref 30.0–36.0)
MCV: 93.7 fL (ref 80.0–100.0)
Monocytes Absolute: 0.9 10*3/uL (ref 0.1–1.0)
Monocytes Relative: 6 %
Neutro Abs: 10 10*3/uL — ABNORMAL HIGH (ref 1.7–7.7)
Neutrophils Relative %: 67 %
Platelets: 219 10*3/uL (ref 150–400)
RBC: 5.2 MIL/uL — ABNORMAL HIGH (ref 3.87–5.11)
RDW: 12.9 % (ref 11.5–15.5)
WBC: 15 10*3/uL — ABNORMAL HIGH (ref 4.0–10.5)
nRBC: 0 % (ref 0.0–0.2)

## 2023-03-13 LAB — URINALYSIS, ROUTINE W REFLEX MICROSCOPIC
Bilirubin Urine: NEGATIVE
Glucose, UA: NEGATIVE mg/dL
Hgb urine dipstick: NEGATIVE
Ketones, ur: NEGATIVE mg/dL
Leukocytes,Ua: NEGATIVE
Nitrite: NEGATIVE
Protein, ur: NEGATIVE mg/dL
Specific Gravity, Urine: 1.004 — ABNORMAL LOW (ref 1.005–1.030)
pH: 6 (ref 5.0–8.0)

## 2023-03-13 LAB — TROPONIN I (HIGH SENSITIVITY)
Troponin I (High Sensitivity): 3 ng/L (ref <18)
Troponin I (High Sensitivity): 3 ng/L (ref <18)

## 2023-03-13 LAB — COMPREHENSIVE METABOLIC PANEL
ALT: 24 U/L (ref 0–44)
AST: 18 U/L (ref 15–41)
Albumin: 3.9 g/dL (ref 3.5–5.0)
Alkaline Phosphatase: 100 U/L (ref 38–126)
Anion gap: 11 (ref 5–15)
BUN: 12 mg/dL (ref 8–23)
CO2: 24 mmol/L (ref 22–32)
Calcium: 8.8 mg/dL — ABNORMAL LOW (ref 8.9–10.3)
Chloride: 103 mmol/L (ref 98–111)
Creatinine, Ser: 1.04 mg/dL — ABNORMAL HIGH (ref 0.44–1.00)
GFR, Estimated: >60 mL/min (ref >60)
Glucose, Bld: 168 mg/dL — ABNORMAL HIGH (ref 70–99)
Potassium: 3.5 mmol/L (ref 3.5–5.1)
Sodium: 138 mmol/L (ref 135–145)
Total Bilirubin: 0.9 mg/dL (ref 0.3–1.2)
Total Protein: 7.2 g/dL (ref 6.5–8.1)

## 2023-03-13 LAB — LACTIC ACID, PLASMA: Lactic Acid, Venous: 1.8 mmol/L (ref 0.5–1.9)

## 2023-03-13 LAB — LIPASE, BLOOD: Lipase: 39 U/L (ref 11–51)

## 2023-03-13 MED ORDER — MORPHINE SULFATE (PF) 4 MG/ML IV SOLN
4.0000 mg | Freq: Once | INTRAVENOUS | Status: AC
  Administered 2023-03-13: 4 mg via INTRAVENOUS
  Filled 2023-03-13: qty 1

## 2023-03-13 MED ORDER — IOHEXOL 350 MG/ML SOLN
100.0000 mL | Freq: Once | INTRAVENOUS | Status: AC | PRN
  Administered 2023-03-13: 100 mL via INTRAVENOUS

## 2023-03-13 MED ORDER — METHOCARBAMOL 500 MG PO TABS: 500.0000 mg | ORAL_TABLET | Freq: Two times a day (BID) | ORAL | 0 refills | Status: DC | PRN

## 2023-03-13 NOTE — ED Triage Notes (Signed)
Pt c/o left flank pain, was seen a couple of weeks ago for same, CT scan was negative. Pain has been unbearable, causing patient chest pain and anxiety in triage.

## 2023-03-13 NOTE — Discharge Instructions (Signed)
You were seen in the emergency department for left flank low back and left lower abdomen pain.  You had blood work chest x-ray EKG and a CAT scan of your chest abdomen and pelvis that did not show an obvious explanation for your symptoms.  You can continue to use Tylenol and ibuprofen, we are prescribing you a muscle relaxant.  He can continue to use ice or heat to the area as tolerated.  Will be important for you to follow-up with your primary care doctor.  Return if any worsening or concerning symptoms.

## 2023-03-13 NOTE — ED Provider Notes (Signed)
[place] EMERGENCY DEPARTMENT AT [facility] Provider Note   CSN: [id number] Arrival date & time: 03/13/23  1444     History {Add pertinent medical, surgical, social history, OB history to HPI:1} Chief Complaint  Patient presents with   Flank Pain    FIA MCMURRY is a 62 y.o. female.  She is here with complaint of left low back pain radiating into her left lower quadrant that is been going on for weeks.  She says it comes and goes but today was severe.  Causes her to feel short of breath and has trouble walking due to pain.  She was seen here 3 weeks ago for same and had a negative workup.  She said she was unable to see her regular doctor due to transportation issues.  She was given steroids and pain medication which did not do anything.  She said she is felt a little constipated no urinary symptoms no numbness no weakness.  No known trauma no fevers.  No cough.  No vomiting or diarrhea.  The history is provided by the patient.  Flank Pain This is a new problem. The current episode started more than 1 week ago. The problem has been gradually worsening. Associated symptoms include abdominal pain and shortness of breath. Pertinent negatives include no chest pain and no headaches. The symptoms are aggravated by bending, twisting and walking. Nothing relieves the symptoms. She has tried rest (narcotics) for the symptoms. The treatment provided no relief.       Home Medications Prior to Admission medications   Medication Sig Start Date End Date Taking? Authorizing Provider  acetaminophen (TYLENOL) 650 MG CR tablet Take 1,300 mg by mouth every 8 (eight) hours as needed for pain.    [provider]  ALPRAZolam Prudy Feeler) 0.5 MG tablet Take 1 tablet (0.5 mg total) by mouth in the morning, at noon, in the evening, and at bedtime. Patient taking differently: Take 0.5 mg by mouth 3 (three) times daily. 06/29/21   Arty Baumgartner, NP  aspirin EC 81 MG tablet Take 1 tablet  (81 mg total) by mouth daily. Swallow whole. 05/20/22   Antoine Poche, MD  atorvastatin (LIPITOR) 80 MG tablet Take 1 tablet (80 mg total) by mouth at bedtime. Patient taking differently: Take 40 mg by mouth at bedtime. 06/29/21   Arty Baumgartner, NP  busPIRone (BUSPAR) 7.5 MG tablet Take by mouth See admin instructions. Take 0.5 tablets (3.75 mg total) by mouth 3 times daily for 7 days, THEN 1 tablet (7.5 mg total) 3 times daily for 7 days, THEN 2 tablets (15 mg total) 3 times daily 06/22/21   [provider]  calcium carbonate (TUMS - DOSED IN MG ELEMENTAL CALCIUM) 500 MG chewable tablet Chew 1,000 mg by mouth daily as needed for indigestion or heartburn.    [provider]  cyclobenzaprine (FLEXERIL) 5 MG tablet Take 5 mg by mouth 3 (three) times daily as needed for muscle spasms. 06/02/21   [provider]  Fremanezumab-vfrm (AJOVY) 225 MG/1.5ML SOAJ Inject 225 mg into the skin every 30 (thirty) days. 01/23/23   Penumalli, Glenford Bayley, MD  HYDROcodone-acetaminophen (NORCO/VICODIN) 5-325 MG tablet Take 1-2 tablets by mouth every 4 (four) hours as needed for severe pain. 02/22/23   Roemhildt, Lorin T, PA-C  insulin lispro (HUMALOG) 100 UNIT/ML KwikPen Inject 2-4 Units into the skin See admin instructions. Inject 2 to 4 units 3 times daily. May inject a 4th dose as needed for high  blood sugar 03/09/19   [provider]  isosorbide mononitrate (IMDUR) 60 MG 24 hr tablet TAKE 1 TABLET BY MOUTH IN THE MORNING AND AT BEDTIME. 12/20/22   Antoine Poche, MD  Menthol, Topical Analgesic, (BIOFREEZE) 10 % LIQD Apply 1 application topically daily as needed (pain).    [provider]  methylPREDNISolone (MEDROL DOSEPAK) 4 MG TBPK tablet Take per package instructions 02/22/23   Roemhildt, Lorin T, PA-C  metoprolol tartrate (LOPRESSOR) 25 MG tablet TAKE (1/2) TABLET BY MOUTH TWICE DAILY 12/20/22   Antoine Poche, MD  nitroGLYCERIN (NITROSTAT) 0.4 MG SL tablet PLACE ONE  TABLET UNDER THE TOUNGE EVERY FIVE MINUTES FOR 3 DOSES AS NEEDED FOR CHEST PAINS. 07/18/22   Antoine Poche, MD  ondansetron (ZOFRAN-ODT) 4 MG disintegrating tablet Take 1 tablet (4 mg total) by mouth every 8 (eight) hours as needed for nausea or vomiting. Patient not taking: Reported on 02/22/2023 05/28/20   Letta Median, PA-C  OZEMPIC, 1 MG/DOSE, 4 MG/3ML SOPN Inject 1 mg into the skin once a week. 10/25/22 04/18/23  [provider]  pramipexole (MIRAPEX) 0.25 MG tablet Take 0.25 mg by mouth at bedtime. 05/01/21   [provider]  Rimegepant Sulfate (NURTEC) 75 MG TBDP Take 1 tablet (75 mg total) by mouth daily as needed. 01/23/23   Penumalli, Glenford Bayley, MD  TRESIBA FLEXTOUCH 100 UNIT/ML SOPN FlexTouch Pen Inject 46 Units into the skin at bedtime. 03/20/19   [provider]      Allergies    Meprobamate, Sinequan [doxepin hcl], Tranxene [clorazepate dipotassium], Albuterol, Haloperidol, Sertraline, Vortioxetine, and Elavil [amitriptyline]    Review of Systems   Review of Systems  Constitutional:  Negative for fever.  Eyes:  Negative for visual disturbance.  Respiratory:  Positive for shortness of breath.   Cardiovascular:  Negative for chest pain.  Gastrointestinal:  Positive for abdominal pain and constipation. Negative for diarrhea, nausea and vomiting.  Genitourinary:  Positive for flank pain. Negative for dysuria.  Musculoskeletal:  Positive for back pain.  Skin:  Negative for rash.  Neurological:  Negative for weakness, numbness and headaches.    Physical Exam Updated Vital Signs BP 122/76 (BP Location: Left Arm)   Pulse (!) 105   Temp 98.6 F (37 C) (Oral)   Resp 18   Ht 5\' 1"  (1.549 m)   Wt 97.5 kg   SpO2 100%   BMI 40.61 kg/m  Physical Exam Vitals and nursing note reviewed.  Constitutional:      General: She is not in acute distress.    Appearance: Normal appearance. She is well-developed.  HENT:     Head: Normocephalic and atraumatic.   Eyes:     Conjunctiva/sclera: Conjunctivae normal.  Cardiovascular:     Rate and Rhythm: Regular rhythm. Tachycardia present.     Heart sounds: No murmur heard. Pulmonary:     Effort: Pulmonary effort is normal. No respiratory distress.     Breath sounds: Normal breath sounds.  Abdominal:     Palpations: Abdomen is soft.     Tenderness: There is abdominal tenderness (LLQ). There is no guarding or rebound.  Musculoskeletal:        General: Tenderness present. No swelling.     Cervical back: Neck supple.     Comments: She has no midline spine tenderness.  She is tender in her left paralumbar and her left lower quadrant  Skin:    General: Skin is warm and dry.     Capillary  Refill: Capillary refill takes less than 2 seconds.  Neurological:     General: No focal deficit present.     Mental Status: She is alert.     Sensory: No sensory deficit.     Motor: No weakness.     ED Results / Procedures / Treatments   Labs (all labs ordered are listed, but only abnormal results are displayed) Labs Reviewed  COMPREHENSIVE METABOLIC PANEL  LACTIC ACID, PLASMA  LACTIC ACID, PLASMA  CBC WITH DIFFERENTIAL/PLATELET  URINALYSIS, ROUTINE W REFLEX MICROSCOPIC  LIPASE, BLOOD  TROPONIN I (HIGH SENSITIVITY)    EKG EKG Interpretation  Date/Time:  Monday Mar 13 2023 15:07:38 EDT Ventricular Rate:  105 PR Interval:  158 QRS Duration: 70 QT Interval:  366 QTC Calculation: 483 R Axis:   72 Text Interpretation: Sinus tachycardia Right atrial enlargement Low voltage QRS Borderline ECG When compared with ECG of 17-Apr-2022 23:35, No significant change since last tracing Confirmed by Meridee Score [phone]) on 03/13/2023 3:14:00 PM  Radiology No results found.  Procedures Procedures  {Document cardiac monitor, telemetry assessment procedure when appropriate:1}  Medications Ordered in ED Medications  morphine (PF) 4 MG/ML injection 4 mg (has no administration in time range)    ED Course/  Medical Decision Making/ A&P   {   Click here for ABCD2, HEART and other calculatorsREFRESH Note before signing :1}                          Medical Decision Making Amount and/or Complexity of Data Reviewed Labs: ordered. Radiology: ordered.  Risk Prescription drug management.   This patient complains of ***; this involves an extensive number of treatment Options and is a complaint that carries with it a high risk of complications and morbidity. The differential includes ***  I ordered, reviewed and interpreted labs, which included *** I ordered medication *** and reviewed PMP when indicated. I ordered imaging studies which included *** and I independently    visualized and interpreted imaging which showed *** Additional history obtained from *** Previous records obtained and reviewed *** I consulted *** and discussed lab and imaging findings and discussed disposition.  Cardiac monitoring reviewed, *** Social determinants considered, *** Critical Interventions: ***  After the interventions stated above, I reevaluated the patient and found *** Admission and further testing considered, ***   {Document critical care time when appropriate:1} {Document review of labs and clinical decision tools ie heart score, Chads2Vasc2 etc:1}  {Document your independent review of radiology images, and any outside records:1} {Document your discussion with family members, caretakers, and with consultants:1} {Document social determinants of health affecting pt's care:1} {Document your decision making why or why not admission, treatments were needed:1} Final Clinical Impression(s) / ED Diagnoses Final diagnoses:  None    Rx / DC Orders ED Discharge Orders     None

## 2023-06-22 NOTE — Progress Notes (Signed)
Clinical Summary Ms. Heltzel is a 62 y.o.female seen for the following medical problems.   1.CAD - admitted 05/2021 with NSTEMI - 05/2021 echo LVEF 60-65%, grade I dd - 05/2021 95% ramus, received PTCA only.        - at prior visit she reported sharp/pressure midchest. Could occur at rest or with exertion. 7-8/10 in severity. +SOB, panic like feeling. Radiated in bewteen shoulder pain. Would last few minutes. Could awake her from sleep. Last episode was a few weeks ago, has improved with cardiac rehab. Would improve with NG - also has reported left neck pain radiates down into shoulder, arm, and left hand. Tightness/tinging feeling. Not positional. Can last all day long.       -ER visit 04/18/22 with chest pain - trop neg x 2, EKG benign.  - pain improved with xanax    - recent left sided chest pains, primarily radiate down left arm.  - can occur at rest or with activity. Lasts a few hours constant. Somewhat atypical but reports similar to pain she had when she had her MI.  Some recent DOE that has progressed over the last few weeks.      2.DM2 - followed by pcp     3. Hyperlipidemia    Jan 2023 TC 122 TG 125 HDL 53 LDL 44 -she is compliant with meds - labs per pcp   4. Anxiety/depression   5. Migraine headaches - followed by Dr Rene Kocher   6. Palpitations 12/2022 monitor: rare ectopy, 2 runs SVT longest 10 beats - mild symptoms , she is on lopressor   Past Medical History:  Diagnosis Date   Anxiety and depression    Bilateral carpal tunnel syndrome    Chronic back pain    Chronic neck pain    Depression    Diabetes mellitus    GERD (gastroesophageal reflux disease)    H/O syncope    Headache    History of panic attacks    Hypertension    IBS (irritable bowel syndrome)    Manic depression (HCC)    Migraine    Panic attack      Allergies  Allergen Reactions   Meprobamate Hives and Swelling   Sinequan [Doxepin Hcl] Anaphylaxis   Tranxene [Clorazepate  Dipotassium] Hives and Swelling   Albuterol Other (See Comments)    Patient does not remember the reaction. This was a record provided via [facility]   Haloperidol Other (See Comments)    Patient does not remember the reaction. This was a record provided via [facility]   Sertraline Other (See Comments)    Patient does not remember the reaction. This was a record provided via [facility]   Vortioxetine Other (See Comments)    Patient does not remember the reaction. This was a record provided via [facility]   Elavil [Amitriptyline] Palpitations     Current Outpatient Medications  Medication Sig Dispense Refill   acetaminophen (TYLENOL) 650 MG CR tablet Take 1,300 mg by mouth every 8 (eight) hours as needed for pain.     ALPRAZolam (XANAX) 0.5 MG tablet Take 1 tablet (0.5 mg total) by mouth in the morning, at noon, in the evening, and at bedtime. (Patient taking differently: Take 0.5 mg by mouth 3 (three) times daily.)     aspirin EC 81 MG tablet Take 1 tablet (81 mg total) by mouth daily. Swallow whole.     atorvastatin (LIPITOR) 80 MG tablet Take 1 tablet (80  mg total) by mouth at bedtime. (Patient taking differently: Take 40 mg by mouth at bedtime.) 90 tablet 1   busPIRone (BUSPAR) 7.5 MG tablet Take by mouth See admin instructions. Take 0.5 tablets (3.75 mg total) by mouth 3 times daily for 7 days, THEN 1 tablet (7.5 mg total) 3 times daily for 7 days, THEN 2 tablets (15 mg total) 3 times daily     calcium carbonate (TUMS - DOSED IN MG ELEMENTAL CALCIUM) 500 MG chewable tablet Chew 1,000 mg by mouth daily as needed for indigestion or heartburn.     Fremanezumab-vfrm (AJOVY) 225 MG/1.5ML SOAJ Inject 225 mg into the skin every 30 (thirty) days. 1.68 mL 6   HYDROcodone-acetaminophen (NORCO/VICODIN) 5-325 MG tablet Take 1-2 tablets by mouth every 4 (four) hours as needed for severe pain. 10 tablet 0   insulin lispro (HUMALOG) 100 UNIT/ML KwikPen Inject 2-4 Units into the  skin See admin instructions. Inject 2 to 4 units 3 times daily. May inject a 4th dose as needed for high blood sugar     isosorbide mononitrate (IMDUR) 60 MG 24 hr tablet TAKE 1 TABLET BY MOUTH IN THE MORNING AND AT BEDTIME. 180 tablet 1   Menthol, Topical Analgesic, (BIOFREEZE) 10 % LIQD Apply 1 application topically daily as needed (pain).     methocarbamol (ROBAXIN) 500 MG tablet Take 1 tablet (500 mg total) by mouth 2 (two) times daily as needed for muscle spasms. 20 tablet 0   methylPREDNISolone (MEDROL DOSEPAK) 4 MG TBPK tablet Take per package instructions 1 each 0   metoprolol tartrate (LOPRESSOR) 25 MG tablet TAKE (1/2) TABLET BY MOUTH TWICE DAILY 90 tablet 1   nitroGLYCERIN (NITROSTAT) 0.4 MG SL tablet PLACE ONE TABLET UNDER THE TOUNGE EVERY FIVE MINUTES FOR 3 DOSES AS NEEDED FOR CHEST PAINS. 25 tablet 3   ondansetron (ZOFRAN-ODT) 4 MG disintegrating tablet Take 1 tablet (4 mg total) by mouth every 8 (eight) hours as needed for nausea or vomiting. (Patient not taking: Reported on 02/22/2023) 30 tablet 2   pramipexole (MIRAPEX) 0.25 MG tablet Take 0.25 mg by mouth at bedtime.     Rimegepant Sulfate (NURTEC) 75 MG TBDP Take 1 tablet (75 mg total) by mouth daily as needed. 8 tablet 6   TRESIBA FLEXTOUCH 100 UNIT/ML SOPN FlexTouch Pen Inject 46 Units into the skin at bedtime.     No current facility-administered medications for this visit.     Past Surgical History:  Procedure Laterality Date   ABDOMINAL HYSTERECTOMY     ABDOMINAL SURGERY     APPENDECTOMY     BIOPSY  09/05/2017   Procedure: BIOPSY;  Surgeon: West Bali, MD;  Location: [facility];  Service: Endoscopy;;  random colon   BIOPSY  05/01/2018   Procedure: BIOPSY;  Surgeon: West Bali, MD;  Location: [facility];  Service: Endoscopy;;  gastric biopsy   CARPAL TUNNEL RELEASE Bilateral    CHOLECYSTECTOMY     COLONOSCOPY WITH PROPOFOL N/A 09/05/2017   Procedure: COLONOSCOPY WITH PROPOFOL;  Surgeon: West Bali,  MD; normal TI, 5 small polyps, diverticulosis in the cecum, internal and external hemorrhoids.  Random colon biopsies benign.  Colon polyps were hyperplastic.  Repeat in 2023.   CORONARY BALLOON ANGIOPLASTY N/A 06/28/2021   Procedure: CORONARY BALLOON ANGIOPLASTY;  Surgeon: Tonny Bollman, MD;  Location[facility] INVASIVE CV LAB;  Service: Cardiovascular;  Laterality: N/A;   DILATION AND CURETTAGE OF UTERUS     ESOPHAGOGASTRODUODENOSCOPY (EGD) WITH PROPOFOL N/A 05/01/2018  Procedure: ESOPHAGOGASTRODUODENOSCOPY (EGD) WITH PROPOFOL;  Surgeon: West Bali, MD;  normal esophagus, small hiatal hernia, mild gastritis s/p biopsy, normal duodenum.  No H. pylori.    HERNIA REPAIR     INCISIONAL HERNIA REPAIR N/A 11/06/2020   Procedure: HERNIA REPAIR INCISIONAL, PRIMARY REPAIR;  Surgeon: Lucretia Roers, MD;  Location: [facility];  Service: General;  Laterality: N/A;   LEFT HEART CATH AND CORONARY ANGIOGRAPHY N/A 06/28/2021   Procedure: LEFT HEART CATH AND CORONARY ANGIOGRAPHY;  Surgeon: Tonny Bollman, MD;  Location: [facility] INVASIVE CV LAB;  Service: Cardiovascular;  Laterality: N/A;   POLYPECTOMY  09/05/2017   Procedure: POLYPECTOMY;  Surgeon: West Bali, MD;  Location: [facility];  Service: Endoscopy;;  sigmoid and rectal   TUBAL LIGATION       Allergies  Allergen Reactions   Meprobamate Hives and Swelling   Sinequan [Doxepin Hcl] Anaphylaxis   Tranxene [Clorazepate Dipotassium] Hives and Swelling   Albuterol Other (See Comments)    Patient does not remember the reaction. This was a record provided via [facility]   Haloperidol Other (See Comments)    Patient does not remember the reaction. This was a record provided via [facility]   Sertraline Other (See Comments)    Patient does not remember the reaction. This was a record provided via [facility]   Vortioxetine Other (See Comments)    Patient does not remember the reaction. This was a record provided via [facility]    Elavil [Amitriptyline] Palpitations      Family History  Problem Relation Age of Onset   Diabetes Mother    Stroke Mother    Depression Mother    Colon cancer Father 60       Passed away from colon cancer   Colon cancer Brother 47       Passed away from colon cancer   Bipolar disorder Son      Social History Ms. Diers reports that she has been smoking cigarettes. She has a 40 pack-year smoking history. She has never used smokeless tobacco. Ms. Howcroft reports no history of alcohol use.   Review of Systems CONSTITUTIONAL: No weight loss, fever, chills, weakness or fatigue.  HEENT: Eyes: No visual loss, blurred vision, double vision or yellow sclerae.No hearing loss, sneezing, congestion, runny nose or sore throat.  SKIN: No rash or itching.  CARDIOVASCULAR: per hpi RESPIRATORY: No shortness of breath, cough or sputum.  GASTROINTESTINAL: No anorexia, nausea, vomiting or diarrhea. No abdominal pain or blood.  GENITOURINARY: No burning on urination, no polyuria NEUROLOGICAL: No headache, dizziness, syncope, paralysis, ataxia, numbness or tingling in the extremities. No change in bowel or bladder control.  MUSCULOSKELETAL: No muscle, back pain, joint pain or stiffness.  LYMPHATICS: No enlarged nodes. No history of splenectomy.  PSYCHIATRIC: No history of depression or anxiety.  ENDOCRINOLOGIC: No reports of sweating, cold or heat intolerance. No polyuria or polydipsia.  Marland Kitchen   Physical Examination Today's Vitals   06/23/23 0922  BP: 110/78  Pulse: 86  SpO2: 96%  Weight: 181 lb (82.1 kg)  Height: 5' 1.5" (1.562 m)   Body mass index is 33.65 kg/m.  Gen: resting comfortably, no acute distress HEENT: no scleral icterus, pupils equal round and reactive, no palptable cervical adenopathy,  CV: per HPI Resp: per HPI GI: abdomen is soft, non-tender, non-distended, normal bowel sounds, no hepatosplenomegaly MSK: extremities are warm, no edema.  Skin: warm, no rash Neuro:  no  focal deficits Psych: appropriate affect  Diagnostic Studies  05/2021 echo IMPRESSIONS     1. Left ventricular ejection fraction, by estimation, is 60 to 65%. The  left ventricle has normal function. The left ventricle has no regional  wall motion abnormalities. Left ventricular diastolic parameters are  consistent with Grade I diastolic  dysfunction (impaired relaxation).   2. Right ventricular systolic function is normal. The right ventricular  size is normal. Tricuspid regurgitation signal is inadequate for assessing  PA pressure.   3. The mitral valve is normal in structure. No evidence of mitral valve  regurgitation. No evidence of mitral stenosis.   4. The aortic valve is tricuspid. Aortic valve regurgitation is not  visualized. Mild aortic valve sclerosis is present, with no evidence of  aortic valve stenosis.   5. The inferior vena cava is normal in size with greater than 50%  respiratory variability, suggesting right atrial pressure of 3 mmHg.    05/2021 cath  Ramus lesion is 95% stenosed.   Balloon angioplasty was performed using a BALLOON SAPPHIRE 2.0X12.   Post intervention, there is a 10% residual stenosis.   1.  Single-vessel coronary artery disease with severe thrombotic stenosis at the bifurcation of the intermediate [place], treated successfully with balloon angioplasty using a 2 mm balloon.  Intermediate subbranch occlusion noted following angioplasty, likely from distal embolization 2.  Patent left main, LAD, AV circumflex, and RCA without significant stenosis 3.  Normal LV function evaluated by echo with normal LVEDP   Recommend: Aggressive medical therapy, tobacco cessation, dual antiplatelet therapy with aspirin and ticagrelor as tolerated for 12 months for treatment of ACS/non-STEMI   04/2020 carotid US IMPRESSION: 1. Less than 50% stenosis of the right ICA. 2. Normal appearance of the left ICA. 3. Antegrade flow is noted within both vertebral  arteries. 4. There are few mildly enlarged left cervical lymph nodes. These are favored to be reactive in etiology. Correlation with physical exam is recommended.   12/2022 heart monitor 7 day monitor   Rare supraventricular ectopy in the form of isolated PACs, couplets. Two runs of SVT longest 10 beats   Rare ventricular ectopy in the form of isolated PVCs, couplets   Reported symptoms correlated with sinus rhythm, PACs,     Patch Wear Time:  7 days and 9 hours (2024-01-24T11:16:10-498 to 2024-01-31T20:35:37-0500)   Patient had a min HR of 76 bpm, max HR of 182 bpm, and avg HR of 92 bpm. Predominant underlying rhythm was Sinus Rhythm. 2 Supraventricular Tachycardia runs occurred, the run with the fastest interval lasting 10 beats with a max rate of 182 bpm (avg 162  bpm); the run with the fastest interval was also the longest. Isolated SVEs were rare (<1.0%), SVE Couplets were rare (<1.0%), and no SVE Triplets were present. Isolated VEs were rare (<1.0%), VE Couplets were rare (<1.0%), and no VE Triplets were  present.   Assessment and Plan  1.CAD - atypical chest pains but she report some similarities to her pain at time of her prior MI, progressive DOE - EKG today shows SR, no acute ischemic changes - plan for exercise nuclear stress test to further evaluate, hold lopressor day of test.    2. Hyperlipidemia - has been at goal, continue current meds   3. HTN - she is at goal, continue current meds   4. Palpitations - recent monitor overall mild ectopy, short rare runs of SVT - symptoms controlled, continue lopressor       Antoine Poche, M.D.

## 2023-06-23 ENCOUNTER — Ambulatory Visit: Payer: 59 | Attending: Cardiology | Admitting: Cardiology

## 2023-06-23 ENCOUNTER — Encounter: Payer: Self-pay | Admitting: Cardiology

## 2023-06-23 ENCOUNTER — Ambulatory Visit: Payer: 59 | Admitting: Cardiology

## 2023-06-23 VITALS — BP 110/78 | HR 86 | Ht 61.5 in | Wt 181.0 lb

## 2023-06-23 DIAGNOSIS — R079 Chest pain, unspecified: Secondary | ICD-10-CM | POA: Diagnosis not present

## 2023-06-23 DIAGNOSIS — R002 Palpitations: Secondary | ICD-10-CM

## 2023-06-23 DIAGNOSIS — I10 Essential (primary) hypertension: Secondary | ICD-10-CM

## 2023-06-23 DIAGNOSIS — E782 Mixed hyperlipidemia: Secondary | ICD-10-CM

## 2023-06-23 DIAGNOSIS — I2510 Atherosclerotic heart disease of native coronary artery without angina pectoris: Secondary | ICD-10-CM

## 2023-06-23 NOTE — Patient Instructions (Signed)
Medication Instructions:  Your physician recommends that you continue on your current medications as directed. Please refer to the Current Medication list given to you today.  *If you need a refill on your cardiac medications before your next appointment, please call your pharmacy*   Lab Work: None If you have labs (blood work) drawn today and your tests are completely normal, you will receive your results only by: MyChart Message (if you have MyChart) OR A paper copy in the mail If you have any lab test that is abnormal or we need to change your treatment, we will call you to review the results.   Testing/Procedures: Exercise Nuclear Stress Test   Follow-Up: At [facility], you and your health needs are our priority.  As part of our continuing mission to provide you with exceptional heart care, we have created designated Provider Care Teams.  These Care Teams include your primary Cardiologist (physician) and Advanced Practice Providers (APPs -  Physician Assistants and Nurse Practitioners) who all work together to provide you with the care you need, when you need it.  We recommend signing up for the patient portal called "MyChart".  Sign up information is provided on this After Visit Summary.  MyChart is used to connect with patients for Virtual Visits (Telemedicine).  Patients are able to view lab/test results, encounter notes, upcoming appointments, etc.  Non-urgent messages can be sent to your provider as well.   To learn more about what you can do with MyChart, go to [email or web address].    Your next appointment:   6 month(s)  Provider:   Dina Rich, MD    Other Instructions

## 2023-06-28 ENCOUNTER — Telehealth (HOSPITAL_COMMUNITY): Payer: Self-pay | Admitting: Emergency Medicine

## 2023-06-29 ENCOUNTER — Encounter (HOSPITAL_COMMUNITY): Payer: 59

## 2023-07-05 ENCOUNTER — Other Ambulatory Visit: Payer: Self-pay | Admitting: Family Medicine

## 2023-07-05 DIAGNOSIS — M546 Pain in thoracic spine: Secondary | ICD-10-CM

## 2023-07-05 DIAGNOSIS — G8929 Other chronic pain: Secondary | ICD-10-CM

## 2023-07-19 ENCOUNTER — Inpatient Hospital Stay: Payer: 59 | Attending: Oncology | Admitting: Oncology

## 2023-07-19 ENCOUNTER — Encounter: Payer: Self-pay | Admitting: Oncology

## 2023-07-19 ENCOUNTER — Other Ambulatory Visit: Payer: Self-pay

## 2023-07-19 ENCOUNTER — Inpatient Hospital Stay: Payer: 59

## 2023-07-19 VITALS — BP 138/81 | HR 95 | Temp 98.4°F | Resp 18 | Ht 61.5 in | Wt 181.4 lb

## 2023-07-19 DIAGNOSIS — R112 Nausea with vomiting, unspecified: Secondary | ICD-10-CM | POA: Insufficient documentation

## 2023-07-19 DIAGNOSIS — Z719 Counseling, unspecified: Secondary | ICD-10-CM | POA: Insufficient documentation

## 2023-07-19 DIAGNOSIS — D72829 Elevated white blood cell count, unspecified: Secondary | ICD-10-CM

## 2023-07-19 DIAGNOSIS — D751 Secondary polycythemia: Secondary | ICD-10-CM | POA: Diagnosis not present

## 2023-07-19 DIAGNOSIS — F17209 Nicotine dependence, unspecified, with unspecified nicotine-induced disorders: Secondary | ICD-10-CM

## 2023-07-19 DIAGNOSIS — J449 Chronic obstructive pulmonary disease, unspecified: Secondary | ICD-10-CM | POA: Insufficient documentation

## 2023-07-19 DIAGNOSIS — Z79899 Other long term (current) drug therapy: Secondary | ICD-10-CM | POA: Insufficient documentation

## 2023-07-19 DIAGNOSIS — F17210 Nicotine dependence, cigarettes, uncomplicated: Secondary | ICD-10-CM | POA: Diagnosis not present

## 2023-07-19 DIAGNOSIS — Z720 Tobacco use: Secondary | ICD-10-CM

## 2023-07-19 DIAGNOSIS — F331 Major depressive disorder, recurrent, moderate: Secondary | ICD-10-CM

## 2023-07-19 DIAGNOSIS — E1169 Type 2 diabetes mellitus with other specified complication: Secondary | ICD-10-CM

## 2023-07-19 LAB — CBC WITH DIFFERENTIAL/PLATELET
Abs Immature Granulocytes: 0.03 10*3/uL (ref 0.00–0.07)
Basophils Absolute: 0.1 10*3/uL (ref 0.0–0.1)
Basophils Relative: 1 %
Eosinophils Absolute: 0.2 10*3/uL (ref 0.0–0.5)
Eosinophils Relative: 2 %
HCT: 50.2 % — ABNORMAL HIGH (ref 36.0–46.0)
Hemoglobin: 16 g/dL — ABNORMAL HIGH (ref 12.0–15.0)
Immature Granulocytes: 0 %
Lymphocytes Relative: 21 %
Lymphs Abs: 2.5 10*3/uL (ref 0.7–4.0)
MCH: 30.4 pg (ref 26.0–34.0)
MCHC: 31.9 g/dL (ref 30.0–36.0)
MCV: 95.3 fL (ref 80.0–100.0)
Monocytes Absolute: 0.7 10*3/uL (ref 0.1–1.0)
Monocytes Relative: 6 %
Neutro Abs: 8.2 10*3/uL — ABNORMAL HIGH (ref 1.7–7.7)
Neutrophils Relative %: 70 %
Platelets: 238 10*3/uL (ref 150–400)
RBC: 5.27 MIL/uL — ABNORMAL HIGH (ref 3.87–5.11)
RDW: 13.1 % (ref 11.5–15.5)
WBC: 11.8 10*3/uL — ABNORMAL HIGH (ref 4.0–10.5)
nRBC: 0 % (ref 0.0–0.2)

## 2023-07-19 LAB — COMPREHENSIVE METABOLIC PANEL WITH GFR
ALT: 22 U/L (ref 0–44)
AST: 15 U/L (ref 15–41)
Albumin: 3.8 g/dL (ref 3.5–5.0)
Alkaline Phosphatase: 80 U/L (ref 38–126)
Anion gap: 10 (ref 5–15)
BUN: 13 mg/dL (ref 8–23)
CO2: 24 mmol/L (ref 22–32)
Calcium: 8.9 mg/dL (ref 8.9–10.3)
Chloride: 104 mmol/L (ref 98–111)
Creatinine, Ser: 1.07 mg/dL — ABNORMAL HIGH (ref 0.44–1.00)
GFR, Estimated: 58 mL/min — ABNORMAL LOW (ref >60)
Glucose, Bld: 166 mg/dL — ABNORMAL HIGH (ref 70–99)
Potassium: 4.5 mmol/L (ref 3.5–5.1)
Sodium: 138 mmol/L (ref 135–145)
Total Bilirubin: 0.7 mg/dL (ref 0.3–1.2)
Total Protein: 7.3 g/dL (ref 6.5–8.1)

## 2023-07-19 LAB — SEDIMENTATION RATE: Sed Rate: 0 mm/h (ref 0–22)

## 2023-07-19 LAB — LACTATE DEHYDROGENASE: LDH: 146 U/L (ref 98–192)

## 2023-07-19 LAB — C-REACTIVE PROTEIN: CRP: 0.7 mg/dL (ref <1.0)

## 2023-07-19 MED ORDER — NICOTINE 14 MG/24HR TD PT24: MEDICATED_PATCH | TRANSDERMAL | 0 refills | Status: DC

## 2023-07-19 MED ORDER — NICOTINE 7 MG/24HR TD PT24: 14.0000 mg | MEDICATED_PATCH | Freq: Every day | TRANSDERMAL | Status: DC

## 2023-07-19 NOTE — Assessment & Plan Note (Signed)
-  Patient has erythrocytosis/polycythemia since 2019 -Likely secondary to smoking -Will repeat labs today and obtain a blood level -Patient is asymptomatic -If EPO level is high, we assess for sleep apnea.  If it is low we will do a JAK2 testing.

## 2023-07-19 NOTE — Assessment & Plan Note (Signed)
-  Patient is a chronic smoker, smokes 2 packs/day -Counseled on smoking cessation -Prescribed nicotine patch.

## 2023-07-19 NOTE — Assessment & Plan Note (Signed)
-  Patient was tearful at the visit today -Denies suicidal ideation -Provided counseling and emotional support

## 2023-07-19 NOTE — Assessment & Plan Note (Signed)
-  Patient reports significant nausea and vomiting -Recently started on Ozempic for which is is a known side effect -As per the patient primary care is obtaining an MRI of chest abdomen and pelvis.  Will follow-up this on next visit

## 2023-07-19 NOTE — Assessment & Plan Note (Signed)
-  Patient had leukocytosis since 2018 -Neutrophil and lymphocyte predominant -Likely secondary to inflammation/smoking or CLL -Will repeat labs today, flow cytometry and LDH -Patient is asymptomatic and denies B symptoms   RTC in 1 month

## 2023-07-19 NOTE — Progress Notes (Signed)
[facility] at [facility] HEMATOLOGY NEW VISIT  Katelyn Found, MD  REASON FOR REFERRAL: Leukocytosis  SUMMARY OF HEMATOLOGIC HISTORY:    Latest Ref Rng & Units 07/19/2023   10:43 AM 03/13/2023    4:09 PM 02/22/2023    1:18 PM  CBC  WBC 4.0 - 10.5 K/uL 11.8  15.0  13.3   Hemoglobin 12.0 - 15.0 g/dL 16.1  09.6  04.5   Hematocrit 36.0 - 46.0 % 50.2  48.7  51.1   Platelets 150 - 400 K/uL 238  219  224       HISTORY OF PRESENT ILLNESS: Katelyn Kelly 62 y.o. female referred by primary care for leukocytosis.  She has a past medical history of anxiety, depression, diabetes, chronic back pain.  She came to clinic unaccompanied today.  Patient was very tearful today, reported that she is out of her trailer today, her son has not been seen in the past 1 week and she went through a lot in the past few years.  She is very anxious about her leukocytosis.  Patient reports chronic back pain constipation sleep problems-falling and staying asleep, nausea and vomiting.  She also reports some shortness of breath on exertion and occasional dry cough.  She denies chest pain, abdominal pain, fever, chills, night sweats, weight loss, loss of appetite, rashes.  She has no other complaints today.  She reported that she was diagnosed with panic attacks and anxiety when she was a child and has been on and off on medication.  She was seeing a psychiatrist but differences and is being managed by primary care at this time.  She denies urinary symptoms, diarrhea, cough and cold.  On review of the chart, patient has had leukocytosis since 2018 with periods of normal white count in between.  Most of it is neutrophils and lymphocytes.  Discussed that her leukocytosis likely secondary to inflammation or CLL.  Discussed that both the clinical conditions are managed with surveillance.  She was also seen to have polycythemia/erythrocytosis since 2019.  Patient did not have sleep study  done.  Patient is a chronic smoker since she was 71 smokes 2 packs/day.  Does not use alcohol or drugs.  Currently living in a trailer with sister.  She reported that her father had colon cancer in 62s.   I have reviewed the past medical history, past surgical history, social history and family history with the patient   ALLERGIES:  is allergic to meprobamate, sinequan [doxepin hcl], tranxene [clorazepate dipotassium], albuterol, haloperidol, sertraline, vortioxetine, and elavil [amitriptyline].  MEDICATIONS:  Current Outpatient Medications  Medication Sig Dispense Refill   ACCU-CHEK GUIDE test strip      acetaminophen (TYLENOL) 650 MG CR tablet Take 1,300 mg by mouth every 8 (eight) hours as needed for pain.     ALPRAZolam (XANAX) 1 MG tablet Take by mouth.     aspirin EC 81 MG tablet Take 1 tablet (81 mg total) by mouth daily. Swallow whole.     atorvastatin (LIPITOR) 80 MG tablet Take 1 tablet (80 mg total) by mouth at bedtime. (Patient taking differently: Take 40 mg by mouth at bedtime.) 90 tablet 1   busPIRone (BUSPAR) 7.5 MG tablet Take by mouth See admin instructions. Take 0.5 tablets (3.75 mg total) by mouth 3 times daily for 7 days, THEN 1 tablet (7.5 mg total) 3 times daily for 7 days, THEN 2 tablets (15 mg total) 3 times daily     calcium  carbonate (TUMS - DOSED IN MG ELEMENTAL CALCIUM) 500 MG chewable tablet Chew 1,000 mg by mouth daily as needed for indigestion or heartburn.     Fremanezumab-vfrm (AJOVY) 225 MG/1.5ML SOAJ Inject 225 mg into the skin every 30 (thirty) days. 1.68 mL 6   HYDROcodone-acetaminophen (NORCO/VICODIN) 5-325 MG tablet Take 1-2 tablets by mouth every 4 (four) hours as needed for severe pain. 10 tablet 0   insulin lispro (HUMALOG) 100 UNIT/ML KwikPen Inject 2-4 Units into the skin See admin instructions. Inject 2 to 4 units 3 times daily. May inject a 4th dose as needed for high blood sugar     isosorbide mononitrate (IMDUR) 60 MG 24 hr tablet TAKE 1 TABLET BY  MOUTH IN THE MORNING AND AT BEDTIME. 180 tablet 1   Menthol, Topical Analgesic, (BIOFREEZE) 10 % LIQD Apply 1 application topically daily as needed (pain).     methocarbamol (ROBAXIN) 500 MG tablet Take 1 tablet (500 mg total) by mouth 2 (two) times daily as needed for muscle spasms. 20 tablet 0   methylPREDNISolone (MEDROL DOSEPAK) 4 MG TBPK tablet Take per package instructions 1 each 0   metoprolol tartrate (LOPRESSOR) 25 MG tablet TAKE (1/2) TABLET BY MOUTH TWICE DAILY 90 tablet 1   nicotine (NICODERM CQ - DOSED IN MG/24 HOURS) 14 mg/24hr patch RX #2 Weeks 5-6: 14 mg x 1 patch daily. Wear for 24 hours. If you have sleep disturbances, remove at bedtime. 14 patch 0   nitroGLYCERIN (NITROSTAT) 0.4 MG SL tablet PLACE ONE TABLET UNDER THE TOUNGE EVERY FIVE MINUTES FOR 3 DOSES AS NEEDED FOR CHEST PAINS. 25 tablet 3   ondansetron (ZOFRAN-ODT) 4 MG disintegrating tablet Take 1 tablet (4 mg total) by mouth every 8 (eight) hours as needed for nausea or vomiting. 30 tablet 2   pramipexole (MIRAPEX) 0.25 MG tablet Take 0.25 mg by mouth at bedtime.     Rimegepant Sulfate (NURTEC) 75 MG TBDP Take 1 tablet (75 mg total) by mouth daily as needed. 8 tablet 6   SURE COMFORT PEN NEEDLES 32G X 4 MM MISC      TRESIBA FLEXTOUCH 100 UNIT/ML SOPN FlexTouch Pen Inject 46 Units into the skin at bedtime.     OZEMPIC, 2 MG/DOSE, 8 MG/3ML SOPN Inject into the skin.     No current facility-administered medications for this visit.     REVIEW OF SYSTEMS:   Constitutional: Denies fevers, chills or night sweats Eyes: Denies blurriness of vision Ears, nose, mouth, throat, and face: Denies mucositis or sore throat Respiratory: Denies cough, dyspnea or wheezes Cardiovascular: Denies palpitation, chest discomfort or lower extremity swelling Gastrointestinal:  Denies nausea, heartburn or change in bowel habits Skin: Denies abnormal skin rashes Lymphatics: Denies new lymphadenopathy or easy bruising Neurological:Denies  numbness, tingling or new weaknesses Behavioral/Psych: Mood is stable, no new changes  All other systems were reviewed with the patient and are negative.  PHYSICAL EXAMINATION:   Vitals:   07/19/23 1002  BP: 138/81  Pulse: 95  Resp: 18  Temp: 98.4 F (36.9 C)  SpO2: 100%    GENERAL:alert, no distress and comfortable SKIN: skin color, texture, turgor are normal, no rashes or significant lesions EYES: normal, Conjunctiva are pink and non-injected, sclera clear OROPHARYNX:no exudate, no erythema and lips, buccal mucosa, and tongue normal  NECK: supple, thyroid normal size, non-tender, without nodularity LYMPH:  no palpable lymphadenopathy in the cervical, axillary or inguinal LUNGS: clear to auscultation and percussion with normal breathing effort HEART: regular rate & rhythm  and no murmurs and no lower extremity edema ABDOMEN:abdomen soft, non-tender and normal bowel sounds, abdominal hernia palpated Musculoskeletal:no cyanosis of digits and no clubbing  NEURO: alert & oriented x 3 with fluent speech  LABORATORY DATA:  I have reviewed the data as listed  Lab Results  Component Value Date   WBC 11.8 (H) 07/19/2023   NEUTROABS 8.2 (H) 07/19/2023   HGB 16.0 (H) 07/19/2023   HCT 50.2 (H) 07/19/2023   MCV 95.3 07/19/2023   PLT 238 07/19/2023      Component Value Date/Time   NA 138 07/19/2023 1043   K 4.5 07/19/2023 1043   CL 104 07/19/2023 1043   CO2 24 07/19/2023 1043   GLUCOSE 166 (H) 07/19/2023 1043   BUN 13 07/19/2023 1043   CREATININE 1.07 (H) 07/19/2023 1043   CALCIUM 8.9 07/19/2023 1043   PROT 7.3 07/19/2023 1043   ALBUMIN 3.8 07/19/2023 1043   AST 15 07/19/2023 1043   ALT 22 07/19/2023 1043   ALKPHOS 80 07/19/2023 1043   BILITOT 0.7 07/19/2023 1043   GFRNONAA 58 (L) 07/19/2023 1043   GFRAA >60 11/18/2018 2221        Chemistry      Component Value Date/Time   NA 138 07/19/2023 1043   K 4.5 07/19/2023 1043   CL 104 07/19/2023 1043   CO2 24  07/19/2023 1043   BUN 13 07/19/2023 1043   CREATININE 1.07 (H) 07/19/2023 1043      Component Value Date/Time   CALCIUM 8.9 07/19/2023 1043   ALKPHOS 80 07/19/2023 1043   AST 15 07/19/2023 1043   ALT 22 07/19/2023 1043   BILITOT 0.7 07/19/2023 1043        ASSESSMENT & PLAN:  Patient is a 62 year old female referred for leukocytosis.  Leukocytosis -Patient had leukocytosis since 2018 -Neutrophil and lymphocyte predominant -Likely secondary to inflammation/smoking or CLL -Will repeat labs today, flow cytometry and LDH -Patient is asymptomatic and denies B symptoms   RTC in 1 month  Erythrocytosis -Patient has erythrocytosis/polycythemia since 2019 -Likely secondary to smoking -Will repeat labs today and obtain a blood level -Patient is asymptomatic -If EPO level is high, we assess for sleep apnea.  If it is low we will do a JAK2 testing.  Tobacco use -Patient is a chronic smoker, smokes 2 packs/day -Counseled on smoking cessation -Prescribed nicotine patch.   Nausea and vomiting -Patient reports significant nausea and vomiting -Recently started on Ozempic for which is is a known side effect -As per the patient primary care is obtaining an MRI of chest abdomen and pelvis.  Will follow-up this on next visit  Counseling, unspecified -Patient was tearful at the visit today -Denies suicidal ideation -Provided counseling and emotional support   Orders Placed This Encounter  Procedures   CBC with Differential/Platelet    Standing Status:   Future    Number of Occurrences:   1    Standing Expiration Date:   07/18/2024   Comprehensive metabolic panel    Standing Status:   Future    Number of Occurrences:   1    Standing Expiration Date:   07/18/2024   Flow Cytometry, Peripheral Blood (Oncology)    Standing Status:   Standing    Number of Occurrences:   1   Lactate dehydrogenase    Standing Status:   Future    Number of Occurrences:   1    Standing Expiration  Date:   07/18/2024   Sedimentation rate  Standing Status:   Future    Number of Occurrences:   1    Standing Expiration Date:   07/18/2024   C-reactive protein    Standing Status:   Future    Number of Occurrences:   1    Standing Expiration Date:   07/18/2024    The total time spent in the appointment was 60 minutes encounter with patients including review of chart and various tests results, discussions about plan of care and coordination of care plan   All questions were answered. The patient knows to call the clinic with any problems, questions or concerns. No barriers to learning was detected.   Cindie Crumbly, MD 9/18/202412:14 PM

## 2023-07-24 LAB — SURGICAL PATHOLOGY

## 2023-07-25 ENCOUNTER — Inpatient Hospital Stay: Payer: 59 | Attending: Oncology

## 2023-07-25 ENCOUNTER — Other Ambulatory Visit: Payer: Self-pay | Admitting: *Deleted

## 2023-07-25 DIAGNOSIS — D72829 Elevated white blood cell count, unspecified: Secondary | ICD-10-CM | POA: Insufficient documentation

## 2023-07-26 LAB — ERYTHROPOIETIN: Erythropoietin: 9.7 m[IU]/mL (ref 2.6–18.5)

## 2023-07-27 ENCOUNTER — Ambulatory Visit: Payer: 59 | Admitting: Adult Health

## 2023-07-27 ENCOUNTER — Encounter: Payer: Self-pay | Admitting: Adult Health

## 2023-07-27 ENCOUNTER — Other Ambulatory Visit: Payer: Self-pay | Admitting: Cardiology

## 2023-07-27 VITALS — BP 123/81 | HR 92 | Ht 61.0 in | Wt 180.0 lb

## 2023-07-27 DIAGNOSIS — G43109 Migraine with aura, not intractable, without status migrainosus: Secondary | ICD-10-CM | POA: Diagnosis not present

## 2023-07-27 DIAGNOSIS — M542 Cervicalgia: Secondary | ICD-10-CM | POA: Diagnosis not present

## 2023-07-27 DIAGNOSIS — R519 Headache, unspecified: Secondary | ICD-10-CM

## 2023-07-27 MED ORDER — UBRELVY 100 MG PO TABS
100.0000 mg | ORAL_TABLET | ORAL | 11 refills | Status: DC | PRN
Start: 2023-07-27 — End: 2023-08-29

## 2023-07-27 MED ORDER — AJOVY 225 MG/1.5ML ~~LOC~~ SOAJ: 225.0000 mg | SUBCUTANEOUS | 11 refills | Status: DC

## 2023-07-27 NOTE — Patient Instructions (Addendum)
Your Plan:  Continue Ajovy injection - if you wish to switch to a different injection medication if migraines remain persistent  Would recommend trying Ubrelvy as needed for migraines, can repeat after 2 hours if needed. Do not take more than 2 tablets in a 24 hr period      Follow up in 6 months or call earlier if needed      Thank you for coming to see [place] at [facility]. I hope we have been able to provide you high quality care today.  You may receive a patient satisfaction survey over the next few weeks. We would appreciate your feedback and comments so that we may continue to improve ourselves and the health of our patients.

## 2023-07-27 NOTE — Progress Notes (Signed)
[facility]  PATIENT: Katelyn Kelly DOB: 07-Aug-1961  REFERRING CLINICIAN: Gwenlyn Found, MD HISTORY FROM: patient  REASON FOR VISIT: Migraine headaches   HISTORICAL  CHIEF COMPLAINT:  Chief Complaint  Patient presents with   Follow-up    Patient in room #3 and alone. Patient states she still get migraines two time a week. Patient states she having pain on the left side of her neck for the past month.    HISTORY OF PRESENT ILLNESS:   Update 07/27/2023 JM: Returns for follow-up visit.  Started on Ajovy for preventative and Nurtec for rescue at prior visit reporting daily tension type headaches and 1 migraines per week.  Currently, reports about 2 migraines per week and daily tension type headaches. Ajovy first provided great benefit but over the past 3-4 months, migraines have worsening. Nurtec without great benefit. Use of ibuprofen up to 2x per week, no longer uses acetaminophen.  Does have chronic neck pain, worse on left side. Does endorse significant increased stressors over the past several months, has lost her home and son is now incarcerated. Difficulty with transportation, had to borrow a friends car to come to appointment. Is scheduled to see optometrist soon to receive new glasses (son broke her prior glasses), she believes this has also contributed to headaches.     Consult visit 01/23/2023 Dr. Marjory Lies: 62 year old female here for evaluation of migraine headaches.  Patient has had migraines since the 1980s with bilateral temporal, occipital and posterior neck pain, throbbing sensation, hearing sensitivity, sensitive to light and sound, nausea and vomiting.  Symptoms of blurred vision, sometimes seeing squiggly lines.  She has tried topiramate and amitriptyline which caused side effects and did not relieve symptoms.  Was having 2-3 migraines per week, now about once per week.  Also having daily headaches which she uses ibuprofen and Tylenol on daily  basis.  Also with significant stress at home related to various factors including her son medical and mental health issues, her financial strain, transportation issues and her own history of traumatic experiences.   REVIEW OF SYSTEMS: Full 14 system review of systems performed and negative with exception of: as per HPI.  ALLERGIES: Allergies  Allergen Reactions   Meprobamate Hives and Swelling   Sinequan [Doxepin Hcl] Anaphylaxis   Tranxene [Clorazepate Dipotassium] Hives and Swelling   Albuterol Other (See Comments)    Patient does not remember the reaction. This was a record provided via [facility]   Haloperidol Other (See Comments)    Patient does not remember the reaction. This was a record provided via [facility]   Sertraline Other (See Comments)    Patient does not remember the reaction. This was a record provided via [facility]   Vortioxetine Other (See Comments)    Patient does not remember the reaction. This was a record provided via [facility]   Elavil [Amitriptyline] Palpitations    HOME MEDICATIONS: Outpatient Medications Prior to Visit  Medication Sig Dispense Refill   ACCU-CHEK GUIDE test strip      ALPRAZolam (XANAX) 1 MG tablet Take by mouth.     aspirin EC 81 MG tablet Take 1 tablet (81 mg total) by mouth daily. Swallow whole.     atorvastatin (LIPITOR) 80 MG tablet Take 1 tablet (80 mg total) by mouth at bedtime. (Patient taking differently: Take 40 mg by mouth at bedtime.) 90 tablet 1   busPIRone (BUSPAR) 7.5 MG tablet Take by mouth See admin instructions. Take 0.5 tablets (3.75 mg  total) by mouth 3 times daily for 7 days, THEN 1 tablet (7.5 mg total) 3 times daily for 7 days, THEN 2 tablets (15 mg total) 3 times daily     calcium carbonate (TUMS - DOSED IN MG ELEMENTAL CALCIUM) 500 MG chewable tablet Chew 1,000 mg by mouth daily as needed for indigestion or heartburn.     Fremanezumab-vfrm (AJOVY) 225 MG/1.5ML SOAJ Inject 225 mg into the  skin every 30 (thirty) days. 1.68 mL 6   insulin lispro (HUMALOG) 100 UNIT/ML KwikPen Inject 2-4 Units into the skin See admin instructions. Inject 2 to 4 units 3 times daily. May inject a 4th dose as needed for high blood sugar     isosorbide mononitrate (IMDUR) 60 MG 24 hr tablet TAKE 1 TABLET BY MOUTH IN THE MORNING AND AT BEDTIME. 180 tablet 1   Menthol, Topical Analgesic, (BIOFREEZE) 10 % LIQD Apply 1 application topically daily as needed (pain).     metoprolol tartrate (LOPRESSOR) 25 MG tablet TAKE (1/2) TABLET BY MOUTH TWICE DAILY 90 tablet 1   nicotine (NICODERM CQ - DOSED IN MG/24 HOURS) 14 mg/24hr patch RX #2 Weeks 5-6: 14 mg x 1 patch daily. Wear for 24 hours. If you have sleep disturbances, remove at bedtime. 14 patch 0   nitroGLYCERIN (NITROSTAT) 0.4 MG SL tablet PLACE ONE TABLET UNDER THE TOUNGE EVERY FIVE MINUTES FOR 3 DOSES AS NEEDED FOR CHEST PAINS. 25 tablet 3   ondansetron (ZOFRAN-ODT) 4 MG disintegrating tablet Take 1 tablet (4 mg total) by mouth every 8 (eight) hours as needed for nausea or vomiting. 30 tablet 2   OZEMPIC, 2 MG/DOSE, 8 MG/3ML SOPN Inject into the skin.     pramipexole (MIRAPEX) 0.25 MG tablet Take 0.25 mg by mouth at bedtime.     Rimegepant Sulfate (NURTEC) 75 MG TBDP Take 1 tablet (75 mg total) by mouth daily as needed. 8 tablet 6   SURE COMFORT PEN NEEDLES 32G X 4 MM MISC      TRESIBA FLEXTOUCH 100 UNIT/ML SOPN FlexTouch Pen Inject 46 Units into the skin at bedtime.     methocarbamol (ROBAXIN) 500 MG tablet Take 1 tablet (500 mg total) by mouth 2 (two) times daily as needed for muscle spasms. 20 tablet 0   methylPREDNISolone (MEDROL DOSEPAK) 4 MG TBPK tablet Take per package instructions 1 each 0   acetaminophen (TYLENOL) 650 MG CR tablet Take 1,300 mg by mouth every 8 (eight) hours as needed for pain. (Patient not taking: Reported on 07/27/2023)     HYDROcodone-acetaminophen (NORCO/VICODIN) 5-325 MG tablet Take 1-2 tablets by mouth every 4 (four) hours as  needed for severe pain. (Patient not taking: Reported on 07/27/2023) 10 tablet 0   No facility-administered medications prior to visit.    PAST MEDICAL HISTORY: Past Medical History:  Diagnosis Date   Anxiety and depression    Bilateral carpal tunnel syndrome    Chronic back pain    Chronic neck pain    Depression    Diabetes mellitus    GERD (gastroesophageal reflux disease)    H/O syncope    Headache    History of panic attacks    Hypertension    IBS (irritable bowel syndrome)    Manic depression (HCC)    Migraine    Panic attack     PAST SURGICAL HISTORY: Past Surgical History:  Procedure Laterality Date   ABDOMINAL HYSTERECTOMY     ABDOMINAL SURGERY     APPENDECTOMY     BIOPSY  09/05/2017   Procedure: BIOPSY;  Surgeon: West Bali, MD;  Location: [facility];  Service: Endoscopy;;  random colon   BIOPSY  05/01/2018   Procedure: BIOPSY;  Surgeon: West Bali, MD;  Location: [facility];  Service: Endoscopy;;  gastric biopsy   CARPAL TUNNEL RELEASE Bilateral    CHOLECYSTECTOMY     COLONOSCOPY WITH PROPOFOL N/A 09/05/2017   Procedure: COLONOSCOPY WITH PROPOFOL;  Surgeon: West Bali, MD; normal TI, 5 small polyps, diverticulosis in the cecum, internal and external hemorrhoids.  Random colon biopsies benign.  Colon polyps were hyperplastic.  Repeat in 2023.   CORONARY BALLOON ANGIOPLASTY N/A 06/28/2021   Procedure: CORONARY BALLOON ANGIOPLASTY;  Surgeon: Tonny Bollman, MD;  Location: [facility] INVASIVE CV LAB;  Service: Cardiovascular;  Laterality: N/A;   DILATION AND CURETTAGE OF UTERUS     ESOPHAGOGASTRODUODENOSCOPY (EGD) WITH PROPOFOL N/A 05/01/2018   Procedure: ESOPHAGOGASTRODUODENOSCOPY (EGD) WITH PROPOFOL;  Surgeon: West Bali, MD;  normal esophagus, small hiatal hernia, mild gastritis s/p biopsy, normal duodenum.  No H. pylori.    HERNIA REPAIR     INCISIONAL HERNIA REPAIR N/A 11/06/2020   Procedure: HERNIA REPAIR INCISIONAL, PRIMARY REPAIR;  Surgeon:  Lucretia Roers, MD;  Location: [facility];  Service: General;  Laterality: N/A;   LEFT HEART CATH AND CORONARY ANGIOGRAPHY N/A 06/28/2021   Procedure: LEFT HEART CATH AND CORONARY ANGIOGRAPHY;  Surgeon: Tonny Bollman, MD;  Location: [facility] INVASIVE CV LAB;  Service: Cardiovascular;  Laterality: N/A;   POLYPECTOMY  09/05/2017   Procedure: POLYPECTOMY;  Surgeon: West Bali, MD;  Location: [facility];  Service: Endoscopy;;  sigmoid and rectal   TUBAL LIGATION      FAMILY HISTORY: Family History  Problem Relation Age of Onset   Diabetes Mother    Stroke Mother    Depression Mother    Colon cancer Father 32       Passed away from colon cancer   Colon cancer Brother 39       Passed away from colon cancer   Bipolar disorder Son     SOCIAL HISTORY: Social History   Socioeconomic History   Marital status: Legally Separated    Spouse name: Not on file   Number of children: Not on file   Years of education: Not on file   Highest education level: Not on file  Occupational History   Not on file  Tobacco Use   Smoking status: Every Day    Current packs/day: 1.00    Average packs/day: 1 pack/day for 40.0 years (40.0 ttl pk-yrs)    Types: Cigarettes   Smokeless tobacco: Never   Tobacco comments:    one pack a day. Pt has purchased nicotine patches. She has a plan to stop and is going to set a quit date.   Vaping Use   Vaping status: Never Used  Substance and Sexual Activity   Alcohol use: No   Drug use: No   Sexual activity: Not Currently    Birth control/protection: Surgical  Other Topics Concern   Not on file  Social History Narrative   Not on file   Social Determinants of Health   Financial Resource Strain: Not on file  Food Insecurity: Low Risk  (04/06/2023)   Received from [facility]   Hunger Vital Sign    Worried About Running Out of Food in the Last Year: Never true    Ran Out of Food in the Last Year: Never true  Transportation  Needs: Not on file (04/06/2023)   Physical Activity: Not on file  Stress: Not on file  Social Connections: Not on file  Intimate Partner Violence: Not on file     PHYSICAL EXAM  GENERAL EXAM/CONSTITUTIONAL: Vitals:  Vitals:   07/27/23 1253  BP: 123/81  Pulse: 92  Weight: 180 lb (81.6 kg)  Height: 5\' 1"  (1.549 m)    Body mass index is 34.01 kg/m. Wt Readings from Last 3 Encounters:  07/27/23 180 lb (81.6 kg)  07/19/23 181 lb 6.4 oz (82.3 kg)  06/23/23 181 lb (82.1 kg)   Patient is in no distress; well developed, nourished and groomed; neck is supple  CARDIOVASCULAR: Examination of carotid arteries is normal; no carotid bruits Regular rate and rhythm, no murmurs Examination of peripheral vascular system by observation and palpation is normal   NEUROLOGIC: MENTAL STATUS:  awake, alert, oriented to person, place and time recent and remote memory intact normal attention and concentration language fluent, comprehension intact, naming intact fund of knowledge appropriate  CRANIAL NERVE:  2nd - no papilledema on fundoscopic exam 2nd, 3rd, 4th, 6th - pupils equal and reactive to light, visual fields full to confrontation, extraocular muscles intact, no nystagmus 5th - facial sensation symmetric 7th - facial strength symmetric 8th - hearing intact 9th - palate elevates symmetrically, uvula midline 11th - shoulder shrug symmetric 12th - tongue protrusion midline  MOTOR:  normal bulk and tone, full strength in the BUE, BLE  SENSORY:  normal and symmetric to light touch, temperature, vibration  COORDINATION:  finger-nose-finger, fine finger movements normal  REFLEXES:  deep tendon reflexes TRACE and symmetric  GAIT/STATION:  narrow based gait     DIAGNOSTIC DATA (LABS, IMAGING, TESTING) - I reviewed patient records, labs, notes, testing and imaging myself where available.  Lab Results  Component Value Date   WBC 11.8 (H) 07/19/2023   HGB 16.0 (H) 07/19/2023   HCT 50.2 (H) 07/19/2023    MCV 95.3 07/19/2023   PLT 238 07/19/2023      Component Value Date/Time   NA 138 07/19/2023 1043   K 4.5 07/19/2023 1043   CL 104 07/19/2023 1043   CO2 24 07/19/2023 1043   GLUCOSE 166 (H) 07/19/2023 1043   BUN 13 07/19/2023 1043   CREATININE 1.07 (H) 07/19/2023 1043   CALCIUM 8.9 07/19/2023 1043   PROT 7.3 07/19/2023 1043   ALBUMIN 3.8 07/19/2023 1043   AST 15 07/19/2023 1043   ALT 22 07/19/2023 1043   ALKPHOS 80 07/19/2023 1043   BILITOT 0.7 07/19/2023 1043   GFRNONAA 58 (L) 07/19/2023 1043   GFRAA >60 11/18/2018 2221   Lab Results  Component Value Date   CHOL 122 11/15/2021   HDL 53 11/15/2021   LDLCALC 44 11/15/2021   TRIG 125 11/15/2021   CHOLHDL 2.3 11/15/2021   Lab Results  Component Value Date   HGBA1C 10.4 (H) 06/26/2021   No results found for: "VITAMINB12" Lab Results  Component Value Date   TSH 1.518 06/26/2021    11/24/21 MRI cervical spine  1. Diffuse cervical spine spondylosis as described above. 2.  No acute osseous injury of the cervical spine.    ASSESSMENT AND PLAN  62 y.o. year old female here with:   Dx:  1. Migraine with aura and without status migrainosus, not intractable   2. Chronic daily headache   3. Cervicalgia       PLAN:  MIGRAINE WITH AURA (~8 per month)  MIGRAINE TREATMENT PLAN:  -Suspect persist  daily headaches and worsening migraines over the past several months in setting of increased stressors  -Prevention: Continue Ajovy monthly injection, discussed trying a different injection but not interested in switching at this time.  -Rescue: Try Bernita Raisin, can repeat after 2 hrs if needed. Limited benefit with Nurtec.  Triptans contraindicated d/t hx of MI -Limit use of OTC pain reliever medications to 5-10 per month to avoid overuse headache  Tried/failed: Topiramate, amitriptyline, ? botox (patient reports previously having multiple injections in the back of her head and across forehead but doesn't remember what the  medication was)   NECK PAIN / DEGENERATIVE SPINE DZ / RIGHT ARM PAIN - follow up per Dr. Mikal Plane (neurosurgery) -Offered PT for possible dry needling but declines interest at this time    Meds ordered this encounter  Medications   Ubrogepant (UBRELVY) 100 MG TABS    Sig: Take 1 tablet (100 mg total) by mouth as needed.    Dispense:  16 tablet    Refill:  11   Return in about 6 months (around 01/24/2024).      I spent 25 minutes of face-to-face and non-face-to-face time with patient.  This included previsit chart review, lab review, study review, order entry, electronic health record documentation, patient education and discussion regarding above diagnoses and treatment plan and answered all other questions to patient's satisfaction  Ihor Austin, [facility]  [facility] [address] [place], [place] [address]  Phone [phone] Fax [phone] Note: This document was prepared with digital dictation and possible smart phrase technology. Any transcriptional errors that result from this process are unintentional.

## 2023-07-28 LAB — FLOW CYTOMETRY

## 2023-08-04 ENCOUNTER — Other Ambulatory Visit: Payer: Self-pay | Admitting: Diagnostic Neuroimaging

## 2023-08-10 ENCOUNTER — Other Ambulatory Visit: Payer: Self-pay

## 2023-08-10 ENCOUNTER — Encounter: Payer: Self-pay | Admitting: Emergency Medicine

## 2023-08-10 ENCOUNTER — Ambulatory Visit
Admission: EM | Admit: 2023-08-10 | Discharge: 2023-08-10 | Disposition: A | Discharge status: Home / Self Care | Payer: 59 | Attending: Nurse Practitioner | Admitting: Nurse Practitioner

## 2023-08-10 ENCOUNTER — Ambulatory Visit: Payer: 59

## 2023-08-10 DIAGNOSIS — S29011A Strain of muscle and tendon of front wall of thorax, initial encounter: Secondary | ICD-10-CM

## 2023-08-10 DIAGNOSIS — M546 Pain in thoracic spine: Secondary | ICD-10-CM | POA: Diagnosis not present

## 2023-08-10 DIAGNOSIS — U071 COVID-19: Secondary | ICD-10-CM | POA: Diagnosis not present

## 2023-08-10 DIAGNOSIS — J441 Chronic obstructive pulmonary disease with (acute) exacerbation: Secondary | ICD-10-CM

## 2023-08-10 MED ORDER — IPRATROPIUM BROMIDE HFA 17 MCG/ACT IN AERS: 2.0000 | INHALATION_SPRAY | Freq: Four times a day (QID) | RESPIRATORY_TRACT | 0 refills | Status: DC | PRN

## 2023-08-10 MED ORDER — DICLOFENAC SODIUM 1 % EX GEL: 4.0000 g | Freq: Four times a day (QID) | CUTANEOUS | 0 refills | Status: DC

## 2023-08-10 MED ORDER — PREDNISONE 10 MG PO TABS: 20.0000 mg | ORAL_TABLET | Freq: Every day | ORAL | 0 refills | Status: DC

## 2023-08-10 MED ORDER — DOXYCYCLINE HYCLATE 100 MG PO TABS: 100.0000 mg | ORAL_TABLET | Freq: Two times a day (BID) | ORAL | 0 refills | Status: DC

## 2023-08-10 MED ORDER — PAXLOVID (300/100) 20 X 150 MG & 10 X 100MG PO TBPK
3.0000 | ORAL_TABLET | Freq: Two times a day (BID) | ORAL | 0 refills | Status: AC
Start: 2023-08-10 — End: 2023-08-15

## 2023-08-10 MED ORDER — IPRATROPIUM BROMIDE 0.02 % IN SOLN
0.5000 mg | Freq: Once | RESPIRATORY_TRACT | Status: AC
  Administered 2023-08-10: 0.5 mg via RESPIRATORY_TRACT

## 2023-08-10 NOTE — ED Triage Notes (Signed)
Pt reports back pain intermittent left sided chest pain after moving box spring and mattress on Monday. Pt also reports generalized body aches, chills x2 days.  Pt reports is currently staying with sister and reports she tested positive for covid. Pt has taken three home covid tests with negative results.

## 2023-08-10 NOTE — Discharge Instructions (Addendum)
Chest x-ray is pending.  EKG was normal.  You will be contacted if the chest x-ray is abnormal.  As discussed, based on your COVID test result this morning, you have tested positive for COVID. Take medication as prescribed. Increase fluids and allow for plenty of rest. May take over-the-counter Tylenol as needed for pain, fever, general discomfort. With regard to your left chest pain and left sided back pain, recommend the use of ice or heat.  Apply for 20 minutes, remove for 1 hour, repeat as needed. I have provided some exercises for you to perform at home to help with your pain. Go to the emergency department immediately if you experience worsening wheezing, shortness of breath, difficulty breathing, or other concerns. Please follow-up with your doctor to notify of the recent COVID diagnosis. Follow-up as needed.

## 2023-08-10 NOTE — ED Provider Notes (Addendum)
[facility]    CSN: [id number] Arrival date & time: 08/10/23  1056      History   Chief Complaint Chief Complaint  Patient presents with   Back Pain    HPI Katelyn Kelly is a 61 y.o. female.   The history is provided by the patient.   Patient presents for complaints of back pain, left-sided chest pain, generalized body aches, chills, and fatigue that been present for the past 2 days.  She states the pain in her back worsened after she was moving a box bring in a mattress.  States while she was moving those items, she felt a "pull and a burn" in the left side of her chest and in her left back.  Patient denies fever, headache, sore throat, nasal congestion, runny nose, cough, abdominal pain, nausea, vomiting, diarrhea, or rash.  Patient reports that she is currently staying with her sister who recently tested positive for COVID.  Patient reports that she has taken 3 home COVID test.  She states the test this morning had a "really dark pink line and the line underneath was light pink."  Patient reports history of CAD.  Patient's past medical history significant for COPD, CAD, type 2 diabetes, and hyperlipidemia.  Patient is a 75 pack year smoker.  Review of the patient's chart shows her most recent hemoglobin A1c was 7.5 in July 2024.  Past Medical History:  Diagnosis Date   Anxiety and depression    Bilateral carpal tunnel syndrome    Chronic back pain    Chronic neck pain    Depression    Diabetes mellitus    GERD (gastroesophageal reflux disease)    H/O syncope    Headache    History of panic attacks    Hypertension    IBS (irritable bowel syndrome)    Manic depression (HCC)    Migraine    Panic attack     Patient Active Problem List   Diagnosis Date Noted   Chronic obstructive pulmonary disease, unspecified COPD type (HCC) 07/19/2023   Leukocytosis 07/19/2023   Erythrocytosis 07/19/2023   Counseling, unspecified 07/19/2023   Hyperlipidemia associated  with type 2 diabetes mellitus (HCC) 06/29/2021   Type 2 diabetes mellitus with complication, with long-term current use of insulin (HCC) 06/29/2021   Tobacco use 06/29/2021   Coronary artery disease involving native coronary artery of native heart with unstable angina pectoris (HCC) 06/29/2021   CAD S/P percutaneous coronary angioplasty of OM1 06/28/2021   NSTEMI (non-ST elevated myocardial infarction) (HCC) 06/26/2021   Incisional hernia, without obstruction or gangrene 10/27/2020   Early satiety 05/28/2020   Panic disorder 12/11/2019   Flank pain 12/09/2019   Diastasis recti 04/11/2019   Intermittent upper abdominal pain 03/28/2019   MDD (major depressive disorder), recurrent episode, moderate (HCC) 11/26/2018   PTSD (post-traumatic stress disorder) 11/26/2018   Nausea and vomiting 02/14/2018   GERD (gastroesophageal reflux disease) 11/08/2017   Dysphagia 11/08/2017   Taking multiple medications for chronic disease 08/02/2017   Family history of colon cancer 08/02/2017   IBS (irritable bowel syndrome) 08/02/2017   Lateral epicondylitis 08/27/2009   JOINT PAIN, HAND 07/30/2009   TRIGGER FINGER 07/30/2009   CARPAL TUNNEL SYNDROME 03/04/2009   Brachial neuritis or radiculitis 03/04/2009   NUMBNESS, HAND 02/02/2009    Past Surgical History:  Procedure Laterality Date   ABDOMINAL HYSTERECTOMY     ABDOMINAL SURGERY     APPENDECTOMY     BIOPSY  09/05/2017   Procedure:  BIOPSY;  Surgeon: West Bali, MD;  Location: [facility];  Service: Endoscopy;;  random colon   BIOPSY  05/01/2018   Procedure: BIOPSY;  Surgeon: West Bali, MD;  Location: [facility];  Service: Endoscopy;;  gastric biopsy   CARPAL TUNNEL RELEASE Bilateral    CHOLECYSTECTOMY     COLONOSCOPY WITH PROPOFOL N/A 09/05/2017   Procedure: COLONOSCOPY WITH PROPOFOL;  Surgeon: West Bali, MD; normal TI, 5 small polyps, diverticulosis in the cecum, internal and external hemorrhoids.  Random colon biopsies  benign.  Colon polyps were hyperplastic.  Repeat in 2023.   CORONARY BALLOON ANGIOPLASTY N/A 06/28/2021   Procedure: CORONARY BALLOON ANGIOPLASTY;  Surgeon: Tonny Bollman, MD;  Location: [facility] INVASIVE CV LAB;  Service: Cardiovascular;  Laterality: N/A;   DILATION AND CURETTAGE OF UTERUS     ESOPHAGOGASTRODUODENOSCOPY (EGD) WITH PROPOFOL N/A 05/01/2018   Procedure: ESOPHAGOGASTRODUODENOSCOPY (EGD) WITH PROPOFOL;  Surgeon: West Bali, MD;  normal esophagus, small hiatal hernia, mild gastritis s/p biopsy, normal duodenum.  No H. pylori.    HERNIA REPAIR     INCISIONAL HERNIA REPAIR N/A 11/06/2020   Procedure: HERNIA REPAIR INCISIONAL, PRIMARY REPAIR;  Surgeon: Lucretia Roers, MD;  Location: [facility];  Service: General;  Laterality: N/A;   LEFT HEART CATH AND CORONARY ANGIOGRAPHY N/A 06/28/2021   Procedure: LEFT HEART CATH AND CORONARY ANGIOGRAPHY;  Surgeon: Tonny Bollman, MD;  Location: [facility] INVASIVE CV LAB;  Service: Cardiovascular;  Laterality: N/A;   POLYPECTOMY  09/05/2017   Procedure: POLYPECTOMY;  Surgeon: West Bali, MD;  Location: [facility];  Service: Endoscopy;;  sigmoid and rectal   TUBAL LIGATION      OB History     Gravida  4   Para  2   Term  2   Preterm      AB  2   Living  2      SAB  2   IAB      Ectopic      Multiple      Live Births               Home Medications    Prior to Admission medications   Medication Sig Start Date End Date Taking? Authorizing Provider  diclofenac Sodium (VOLTAREN ARTHRITIS PAIN) 1 % GEL Apply 4 g topically 4 (four) times daily. 08/10/23  Yes Satchel Heidinger-Warren, Sadie Haber, NP  doxycycline (VIBRA-TABS) 100 MG tablet Take 1 tablet (100 mg total) by mouth 2 (two) times daily. 08/10/23  Yes Joua Bake-Warren, Sadie Haber, NP  ipratropium (ATROVENT HFA) 17 MCG/ACT inhaler Inhale 2 puffs into the lungs every 6 (six) hours as needed for wheezing. 08/10/23  Yes Wrenley Sayed-Warren, Sadie Haber, NP  nirmatrelvir/ritonavir (PAXLOVID,  300/100,) 20 x 150 MG & 10 x 100MG  TBPK Take 3 tablets by mouth 2 (two) times daily for 5 days. Patient GFR is 58.  Take nirmatrelvir (150 mg) two tablets twice daily for 5 days and ritonavir (100 mg) one tablet twice daily for 5 days. 08/10/23 08/15/23 Yes Isac Lincks-Warren, Sadie Haber, NP  predniSONE (DELTASONE) 10 MG tablet Take 2 tablets (20 mg total) by mouth daily with breakfast. 08/10/23  Yes Laveyah Oriol-Warren, Sadie Haber, NP  ACCU-CHEK GUIDE test strip  06/29/23   [provider]  ALPRAZolam Prudy Feeler) 1 MG tablet Take by mouth. 05/26/23 05/25/24  [provider]  aspirin EC 81 MG tablet Take 1 tablet (81 mg total) by mouth daily. Swallow whole. 05/20/22   Antoine Poche, MD  atorvastatin (LIPITOR) 80 MG tablet Take 1 tablet (80 mg total) by mouth at bedtime. Patient taking differently: Take 40 mg by mouth at bedtime. 06/29/21   Arty Baumgartner, NP  busPIRone (BUSPAR) 7.5 MG tablet Take by mouth See admin instructions. Take 0.5 tablets (3.75 mg total) by mouth 3 times daily for 7 days, THEN 1 tablet (7.5 mg total) 3 times daily for 7 days, THEN 2 tablets (15 mg total) 3 times daily 06/22/21   [provider]  calcium carbonate (TUMS - DOSED IN MG ELEMENTAL CALCIUM) 500 MG chewable tablet Chew 1,000 mg by mouth daily as needed for indigestion or heartburn.    [provider]  Fremanezumab-vfrm (AJOVY) 225 MG/1.5ML SOAJ Inject 225 mg into the skin every 30 (thirty) days. 07/27/23   Ihor Austin, NP  insulin lispro (HUMALOG) 100 UNIT/ML KwikPen Inject 2-4 Units into the skin See admin instructions. Inject 2 to 4 units 3 times daily. May inject a 4th dose as needed for high blood sugar 03/09/19   [provider]  isosorbide mononitrate (IMDUR) 60 MG 24 hr tablet TAKE 1 TABLET BY MOUTH IN THE MORNING AND AT BEDTIME. 12/20/22   Antoine Poche, MD  Menthol, Topical Analgesic, (BIOFREEZE) 10 % LIQD Apply 1 application topically daily as needed (pain).    [provider]  metoprolol tartrate (LOPRESSOR) 25 MG tablet TAKE (1/2) TABLET BY MOUTH TWICE DAILY 07/27/23   Antoine Poche, MD  nicotine (NICODERM CQ - DOSED IN MG/24 HOURS) 14 mg/24hr patch RX #2 Weeks 5-6: 14 mg x 1 patch daily. Wear for 24 hours. If you have sleep disturbances, remove at bedtime. 07/19/23   Cindie Crumbly, MD  nitroGLYCERIN (NITROSTAT) 0.4 MG SL tablet PLACE ONE TABLET UNDER THE TOUNGE EVERY FIVE MINUTES FOR 3 DOSES AS NEEDED FOR CHEST PAINS. 07/18/22   Antoine Poche, MD  NURTEC 75 MG TBDP TAKE ONE TABLET DAILY AS NEEDED. 08/07/23   Penumalli, Glenford Bayley, MD  ondansetron (ZOFRAN-ODT) 4 MG disintegrating tablet Take 1 tablet (4 mg total) by mouth every 8 (eight) hours as needed for nausea or vomiting. 05/28/20   Ermalinda Memos S, PA-C  OZEMPIC, 2 MG/DOSE, 8 MG/3ML SOPN Inject into the skin.    [provider]  pramipexole (MIRAPEX) 0.25 MG tablet Take 0.25 mg by mouth at bedtime. 05/01/21   [provider]  SURE COMFORT PEN NEEDLES 32G X 4 MM MISC  06/26/23   [provider]  TRESIBA FLEXTOUCH 100 UNIT/ML SOPN FlexTouch Pen Inject 46 Units into the skin at bedtime. 03/20/19   [provider]  Ubrogepant (UBRELVY) 100 MG TABS Take 1 tablet (100 mg total) by mouth as needed. 07/27/23   Ihor Austin, NP    Family History Family History  Problem Relation Age of Onset   Diabetes Mother    Stroke Mother    Depression Mother    Colon cancer Father 18       Passed away from colon cancer   Colon cancer Brother 47       Passed away from colon cancer   Bipolar disorder Son     Social History Social History   Tobacco Use   Smoking status: Every Day    Current packs/day: 1.00    Average packs/day: 1 pack/day for 40.0 years (40.0 ttl pk-yrs)    Types: Cigarettes   Smokeless tobacco: Never   Tobacco comments:    one pack a day. Pt has purchased nicotine patches. She  has a plan to stop and is going to set a quit date.   Vaping Use    Vaping status: Never Used  Substance Use Topics   Alcohol use: No   Drug use: No     Allergies   Meprobamate, Sinequan [doxepin hcl], Tranxene [clorazepate dipotassium], Albuterol, Haloperidol, Sertraline, Vortioxetine, and Elavil [amitriptyline]   Review of Systems Review of Systems Per HPI  Physical Exam Triage Vital Signs ED Triage Vitals  Encounter Vitals Group     BP 08/10/23 1107 102/66     Systolic BP Percentile --      Diastolic BP Percentile --      Pulse Rate 08/10/23 1107 92     Resp 08/10/23 1107 20     Temp 08/10/23 1107 98.5 F (36.9 C)     Temp Source 08/10/23 1107 Oral     SpO2 08/10/23 1107 97 %     Weight --      Height --      Head Circumference --      Peak Flow --      Pain Score 08/10/23 1105 7     Pain Loc --      Pain Education --      Exclude from Growth Chart --    No data found.  Updated Vital Signs BP 102/66 (BP Location: Right Arm)   Pulse 92   Temp 98.5 F (36.9 C) (Oral)   Resp 20   SpO2 97%   Visual Acuity Right Eye Distance:   Left Eye Distance:   Bilateral Distance:    Right Eye Near:   Left Eye Near:    Bilateral Near:     Physical Exam Vitals and nursing note reviewed.  Constitutional:      General: She is not in acute distress.    Appearance: Normal appearance.  HENT:     Head: Normocephalic.     Mouth/Throat:     Mouth: Mucous membranes are moist.     Pharynx: Posterior oropharyngeal erythema present.  Eyes:     Extraocular Movements: Extraocular movements intact.     Conjunctiva/sclera: Conjunctivae normal.     Pupils: Pupils are equal, round, and reactive to light.  Cardiovascular:     Rate and Rhythm: Regular rhythm.     Pulses: Normal pulses.     Heart sounds: Normal heart sounds.  Pulmonary:     Effort: Pulmonary effort is normal. No respiratory distress.     Breath sounds: No stridor. Wheezing (expiratory wheezing noted throughout) present. No rhonchi or rales.  Chest:     Chest wall: Tenderness  present.    Abdominal:     General: Bowel sounds are normal.     Palpations: Abdomen is soft.  Musculoskeletal:     Cervical back: Normal range of motion.     Thoracic back: Tenderness (Left thoracic spine from T12-T3, and corresponding paraspinal regions.) present. No swelling or deformity. Decreased range of motion.  Lymphadenopathy:     Cervical: No cervical adenopathy.  Skin:    General: Skin is warm and dry.  Neurological:     General: No focal deficit present.     Mental Status: She is alert and oriented to person, place, and time.  Psychiatric:        Mood and Affect: Mood normal.        Behavior: Behavior normal.      UC Treatments / Results  Labs (all labs ordered are listed, but only abnormal results are  displayed) Labs Reviewed - No data to display  EKG: Normal sinus rhythm no ectopy, no STEMI.  Compared to EKGs performed on 06/23/2023 and 03/14/2023.   Radiology DG Chest 2 View  Result Date: 08/10/2023 CLINICAL DATA:  Chest pain and wheezing.  History of smoking. EXAM: CHEST - 2 VIEW COMPARISON:  Chest radiograph and CT 03/13/2023 FINDINGS: The cardiomediastinal silhouette is within normal limits. The lungs are hyperinflated with chronic interstitial coarsening. No acute airspace consolidation, edema, pleural effusion, or pneumothorax is identified. Right upper quadrant abdominal surgical clips are noted. No acute osseous abnormality is seen. IMPRESSION: No active cardiopulmonary disease. Electronically Signed   By: Sebastian Ache M.D.   On: 08/10/2023 12:57    Procedures Procedures (including critical care time)  Medications Ordered in UC Medications  ipratropium (ATROVENT) nebulizer solution 0.5 mg (0.5 mg Nebulization Given 08/10/23 1128)    Initial Impression / Assessment and Plan / UC Course  I have reviewed the triage vital signs and the nursing notes.  Pertinent labs & imaging results that were available during my care of the patient were reviewed by me  and considered in my medical decision making (see chart for details).  The patient is well-appearing, she is in no acute distress, vital signs are stable.  Chest x-ray is pending.  EKG shows normal sinus rhythm, no STEMI.  On exam, patient does have left-sided chest wall tenderness, and left thoracic tenderness, symptoms consistent with a possible musculoskeletal strain.  She did test positive for COVID given her recount of the test result that she saw this morning.  On exam, she was also noted to have wheezing throughout, with improvement after the breathing treatment.  Will treat with Paxlovid, doxycycline 100 mg for 5 days, prednisone 20 mg, and an ipratropium inhaler for shortness of breath and wheezing.  Supportive care recommendations were provided and discussed with the patient to include increasing fluids, allowing for plenty of rest, and over-the-counter Tylenol.  Patient was advised she will be contacted if her chest x-ray is abnormal.  Patient advised to follow-up with her PCP if symptoms fail to improve, she was also given strict ER follow-up precautions for worsening symptoms.  Patient is in agreement with this plan of care and verbalizes understanding.  All questions were answered.  Patient stable for discharge.  Final Clinical Impressions(s) / UC Diagnoses   Final diagnoses:  COPD exacerbation (HCC)  Left-sided thoracic back pain, unspecified chronicity  Pectoralis muscle strain, initial encounter  COVID     Discharge Instructions      Chest x-ray is pending.  EKG was normal.  You will be contacted if the chest x-ray is abnormal.  As discussed, based on your COVID test result this morning, you have tested positive for COVID. Take medication as prescribed. Increase fluids and allow for plenty of rest. May take over-the-counter Tylenol as needed for pain, fever, general discomfort. With regard to your left chest pain and left sided back pain, recommend the use of ice or heat.   Apply for 20 minutes, remove for 1 hour, repeat as needed. I have provided some exercises for you to perform at home to help with your pain. Go to the emergency department immediately if you experience worsening wheezing, shortness of breath, difficulty breathing, or other concerns. Please follow-up with your doctor to notify of the recent COVID diagnosis. Follow-up as needed.     ED Prescriptions     Medication Sig Dispense Auth. Provider   nirmatrelvir/ritonavir (PAXLOVID, 300/100,) 20 x  150 MG & 10 x 100MG  TBPK Take 3 tablets by mouth 2 (two) times daily for 5 days. Patient GFR is 58.  Take nirmatrelvir (150 mg) two tablets twice daily for 5 days and ritonavir (100 mg) one tablet twice daily for 5 days. 30 tablet Roper Tolson-Warren, Sadie Haber, NP   diclofenac Sodium (VOLTAREN ARTHRITIS PAIN) 1 % GEL Apply 4 g topically 4 (four) times daily. 150 g Algie Cales-Warren, Sadie Haber, NP   predniSONE (DELTASONE) 10 MG tablet Take 2 tablets (20 mg total) by mouth daily with breakfast. 5 tablet Darrick Greenlaw-Warren, Sadie Haber, NP   doxycycline (VIBRA-TABS) 100 MG tablet Take 1 tablet (100 mg total) by mouth 2 (two) times daily. 10 tablet Jaislyn Blinn-Warren, Sadie Haber, NP   ipratropium (ATROVENT HFA) 17 MCG/ACT inhaler Inhale 2 puffs into the lungs every 6 (six) hours as needed for wheezing. 1 each Marasia Newhall-Warren, Sadie Haber, NP      PDMP not reviewed this encounter.   Katelyn Cantor, NP 08/10/23 1227    Katelyn Cantor, NP 08/10/23 1428    Nhan Qualley-Warren, Sadie Haber, NP 08/10/23 1429

## 2023-08-15 ENCOUNTER — Other Ambulatory Visit: Payer: Self-pay

## 2023-08-15 DIAGNOSIS — D72829 Elevated white blood cell count, unspecified: Secondary | ICD-10-CM

## 2023-08-16 ENCOUNTER — Telehealth: Payer: Self-pay

## 2023-08-16 ENCOUNTER — Inpatient Hospital Stay: Payer: 59 | Attending: Oncology

## 2023-08-16 ENCOUNTER — Other Ambulatory Visit (HOSPITAL_COMMUNITY): Payer: Self-pay

## 2023-08-16 DIAGNOSIS — D72829 Elevated white blood cell count, unspecified: Secondary | ICD-10-CM | POA: Diagnosis present

## 2023-08-16 DIAGNOSIS — D751 Secondary polycythemia: Secondary | ICD-10-CM | POA: Diagnosis present

## 2023-08-16 DIAGNOSIS — F17210 Nicotine dependence, cigarettes, uncomplicated: Secondary | ICD-10-CM | POA: Insufficient documentation

## 2023-08-16 DIAGNOSIS — Z79899 Other long term (current) drug therapy: Secondary | ICD-10-CM | POA: Insufficient documentation

## 2023-08-16 DIAGNOSIS — Z7982 Long term (current) use of aspirin: Secondary | ICD-10-CM | POA: Diagnosis not present

## 2023-08-16 DIAGNOSIS — R112 Nausea with vomiting, unspecified: Secondary | ICD-10-CM | POA: Insufficient documentation

## 2023-08-16 LAB — COMPREHENSIVE METABOLIC PANEL
ALT: 46 U/L — ABNORMAL HIGH (ref 0–44)
AST: 26 U/L (ref 15–41)
Albumin: 3.8 g/dL (ref 3.5–5.0)
Alkaline Phosphatase: 79 U/L (ref 38–126)
Anion gap: 10 (ref 5–15)
BUN: 18 mg/dL (ref 8–23)
CO2: 23 mmol/L (ref 22–32)
Calcium: 8.5 mg/dL — ABNORMAL LOW (ref 8.9–10.3)
Chloride: 105 mmol/L (ref 98–111)
Creatinine, Ser: 1.08 mg/dL — ABNORMAL HIGH (ref 0.44–1.00)
GFR, Estimated: 58 mL/min — ABNORMAL LOW (ref >60)
Glucose, Bld: 157 mg/dL — ABNORMAL HIGH (ref 70–99)
Potassium: 4.2 mmol/L (ref 3.5–5.1)
Sodium: 138 mmol/L (ref 135–145)
Total Bilirubin: 0.4 mg/dL (ref 0.3–1.2)
Total Protein: 7.2 g/dL (ref 6.5–8.1)

## 2023-08-16 LAB — CBC WITH DIFFERENTIAL/PLATELET
Abs Immature Granulocytes: 0.06 10*3/uL (ref 0.00–0.07)
Basophils Absolute: 0.1 10*3/uL (ref 0.0–0.1)
Basophils Relative: 1 %
Eosinophils Absolute: 0.3 10*3/uL (ref 0.0–0.5)
Eosinophils Relative: 2 %
HCT: 48.5 % — ABNORMAL HIGH (ref 36.0–46.0)
Hemoglobin: 16 g/dL — ABNORMAL HIGH (ref 12.0–15.0)
Immature Granulocytes: 0 %
Lymphocytes Relative: 29 %
Lymphs Abs: 4 10*3/uL (ref 0.7–4.0)
MCH: 31.1 pg (ref 26.0–34.0)
MCHC: 33 g/dL (ref 30.0–36.0)
MCV: 94.4 fL (ref 80.0–100.0)
Monocytes Absolute: 1 10*3/uL (ref 0.1–1.0)
Monocytes Relative: 7 %
Neutro Abs: 8.1 10*3/uL — ABNORMAL HIGH (ref 1.7–7.7)
Neutrophils Relative %: 61 %
Platelets: 248 10*3/uL (ref 150–400)
RBC: 5.14 MIL/uL — ABNORMAL HIGH (ref 3.87–5.11)
RDW: 13.4 % (ref 11.5–15.5)
WBC: 13.5 10*3/uL — ABNORMAL HIGH (ref 4.0–10.5)
nRBC: 0 % (ref 0.0–0.2)

## 2023-08-16 NOTE — Telephone Encounter (Signed)
*  [facility]  Pharmacy Patient Advocate Encounter  Received notification from [facility] that Prior Authorization for Ubrelvy 100MG  tablets  has been APPROVED from 08/16/2023 to 10/30/2024. Ran test claim, Copay is $refill too soon. This test claim was processed through [facility]- copay amounts may vary at other pharmacies due to pharmacy/plan contracts, or as the patient moves through the different stages of their insurance plan.   PA #/Case ID/Reference #: [id number]

## 2023-08-23 ENCOUNTER — Inpatient Hospital Stay: Payer: 59 | Admitting: Oncology

## 2023-08-24 ENCOUNTER — Inpatient Hospital Stay: Payer: 59 | Admitting: Oncology

## 2023-08-24 ENCOUNTER — Encounter: Payer: Self-pay | Admitting: Oncology

## 2023-08-24 ENCOUNTER — Inpatient Hospital Stay: Payer: 59

## 2023-08-24 VITALS — BP 114/86 | HR 92 | Temp 99.0°F | Resp 18 | Ht 61.0 in | Wt 176.4 lb

## 2023-08-24 DIAGNOSIS — D72825 Bandemia: Secondary | ICD-10-CM

## 2023-08-24 DIAGNOSIS — Z720 Tobacco use: Secondary | ICD-10-CM | POA: Diagnosis not present

## 2023-08-24 DIAGNOSIS — R112 Nausea with vomiting, unspecified: Secondary | ICD-10-CM | POA: Diagnosis not present

## 2023-08-24 DIAGNOSIS — D751 Secondary polycythemia: Secondary | ICD-10-CM

## 2023-08-24 DIAGNOSIS — D72829 Elevated white blood cell count, unspecified: Secondary | ICD-10-CM | POA: Diagnosis not present

## 2023-08-24 NOTE — Assessment & Plan Note (Signed)
Chronic, severe, and associated with abdominal pain. No clear etiology identified. Current antiemetic therapy is ineffective. -Refer to Gastroenterology for further evaluation and management.

## 2023-08-24 NOTE — Assessment & Plan Note (Signed)
Persistent, with neutrophil predominance. Possible association with smoking.  Negative flow cytometry.  No signs and symptoms of infection -Order BCR-ABL PCR to rule out CML. -Encouraged smoking cessation

## 2023-08-24 NOTE — Assessment & Plan Note (Signed)
Persistent, with normal EPO levels. Possible secondary to undiagnosed sleep apnea. -Order JAK2 testing to rule out primary polycythemia. -Refer to Sleep Medicine for evaluation of suspected sleep apnea.

## 2023-08-24 NOTE — Progress Notes (Signed)
[facility] at [facility] HEMATOLOGY FOLLOW-UP VISIT  Gwenlyn Found, MD  REASON FOR FOLLOW-UP: Leukocytosis and polycythemia  ASSESSMENT & PLAN:  Patient is a 62 year old female following for leukocytosis and polycythemia.   Leukocytosis Persistent, with neutrophil predominance. Possible association with smoking.  Negative flow cytometry.  No signs and symptoms of infection -Order BCR-ABL PCR to rule out CML. -Encouraged smoking cessation  Erythrocytosis Persistent, with normal EPO levels. Possible secondary to undiagnosed sleep apnea. -Order JAK2 testing to rule out primary polycythemia. -Refer to Sleep Medicine for evaluation of suspected sleep apnea.  Nausea and vomiting Chronic, severe, and associated with abdominal pain. No clear etiology identified. Current antiemetic therapy is ineffective. -Refer to Gastroenterology for further evaluation and management.   Tobacco use Patient is a chronic smoker, smokes 2 packs/day -Counseled on smoking cessation -Patient stated that she has patches and did not require any at this time   Orders Placed This Encounter  Procedures   BCR-ABL1, CML/ALL, PCR, QUANT    Standing Status:   Future    Number of Occurrences:   1    Standing Expiration Date:   08/23/2024   JAK2 V617F rfx CALR/MPL/E12-15    Standing Status:   Future    Number of Occurrences:   1    Standing Expiration Date:   08/23/2024    The total time spent in the appointment was 15 minutes encounter with patients including review of chart and various tests results, discussions about plan of care and coordination of care plan   All questions were answered. The patient knows to call the clinic with any problems, questions or concerns. No barriers to learning was detected.  Cindie Crumbly, MD 10/24/20243:29 PM     INTERVAL HISTORY: Katelyn Kelly 62 y.o. female is here for follow-up for leukocytosis and polycythemia.  She reports  ongoing nausea and vomiting.She denies any correlation with Ozempic, a medication she has been on for a while. She also reports constant exhaustion, often falling asleep during the day and waking up tired. She has not been sleeping well and has been experiencing constipation. She reports abdominal pain, specifically in the upper abdomen, which worsens with eating or drinking. She also notes a loss of appetite, often feeling hungry but losing interest in food due to the fear of vomiting. She recently recovered from COVID-19, which she describes as a horrible experience. She has been using an inhaler since her recovery. She also reports a history of a hernia in the upper abdomen, which has been surgically removed. She is currently living with her sister and has a history of smoking, which she is considering quitting.  I have reviewed the past medical history, past surgical history, social history and family history with the patient   ALLERGIES:  is allergic to meprobamate, sinequan [doxepin hcl], tranxene [clorazepate dipotassium], albuterol, haloperidol, sertraline, vortioxetine, and elavil [amitriptyline].  MEDICATIONS:  Current Outpatient Medications  Medication Sig Dispense Refill   ACCU-CHEK GUIDE test strip      ALPRAZolam (XANAX) 1 MG tablet Take by mouth.     aspirin EC 81 MG tablet Take 1 tablet (81 mg total) by mouth daily. Swallow whole.     atorvastatin (LIPITOR) 80 MG tablet Take 1 tablet (80 mg total) by mouth at bedtime. (Patient taking differently: Take 40 mg by mouth at bedtime.) 90 tablet 1   busPIRone (BUSPAR) 7.5 MG tablet Take by mouth See admin instructions. Take 0.5 tablets (3.75 mg total) by mouth 3  times daily for 7 days, THEN 1 tablet (7.5 mg total) 3 times daily for 7 days, THEN 2 tablets (15 mg total) 3 times daily     calcium carbonate (TUMS - DOSED IN MG ELEMENTAL CALCIUM) 500 MG chewable tablet Chew 1,000 mg by mouth daily as needed for indigestion or heartburn.      diclofenac Sodium (VOLTAREN ARTHRITIS PAIN) 1 % GEL Apply 4 g topically 4 (four) times daily. 150 g 0   doxycycline (VIBRA-TABS) 100 MG tablet Take 1 tablet (100 mg total) by mouth 2 (two) times daily. 10 tablet 0   Fremanezumab-vfrm (AJOVY) 225 MG/1.5ML SOAJ Inject 225 mg into the skin every 30 (thirty) days. 1.68 mL 11   insulin lispro (HUMALOG) 100 UNIT/ML KwikPen Inject 2-4 Units into the skin See admin instructions. Inject 2 to 4 units 3 times daily. May inject a 4th dose as needed for high blood sugar     ipratropium (ATROVENT HFA) 17 MCG/ACT inhaler Inhale 2 puffs into the lungs every 6 (six) hours as needed for wheezing. 1 each 0   isosorbide mononitrate (IMDUR) 60 MG 24 hr tablet TAKE 1 TABLET BY MOUTH IN THE MORNING AND AT BEDTIME. 180 tablet 1   Menthol, Topical Analgesic, (BIOFREEZE) 10 % LIQD Apply 1 application topically daily as needed (pain).     metoprolol tartrate (LOPRESSOR) 25 MG tablet TAKE (1/2) TABLET BY MOUTH TWICE DAILY 90 tablet 3   nicotine (NICODERM CQ - DOSED IN MG/24 HOURS) 14 mg/24hr patch RX #2 Weeks 5-6: 14 mg x 1 patch daily. Wear for 24 hours. If you have sleep disturbances, remove at bedtime. 14 patch 0   nitroGLYCERIN (NITROSTAT) 0.4 MG SL tablet PLACE ONE TABLET UNDER THE TOUNGE EVERY FIVE MINUTES FOR 3 DOSES AS NEEDED FOR CHEST PAINS. 25 tablet 3   NURTEC 75 MG TBDP TAKE ONE TABLET DAILY AS NEEDED. 8 tablet 6   ondansetron (ZOFRAN-ODT) 4 MG disintegrating tablet Take 1 tablet (4 mg total) by mouth every 8 (eight) hours as needed for nausea or vomiting. 30 tablet 2   OZEMPIC, 2 MG/DOSE, 8 MG/3ML SOPN Inject into the skin.     pramipexole (MIRAPEX) 0.25 MG tablet Take 0.25 mg by mouth at bedtime.     predniSONE (DELTASONE) 10 MG tablet Take 2 tablets (20 mg total) by mouth daily with breakfast. 5 tablet 0   SURE COMFORT PEN NEEDLES 32G X 4 MM MISC      TRESIBA FLEXTOUCH 100 UNIT/ML SOPN FlexTouch Pen Inject 46 Units into the skin at bedtime.     Ubrogepant  (UBRELVY) 100 MG TABS Take 1 tablet (100 mg total) by mouth as needed. 16 tablet 11   No current facility-administered medications for this visit.     REVIEW OF SYSTEMS:   Constitutional: Denies fevers, chills or night sweats Eyes: Denies blurriness of vision Ears, nose, mouth, throat, and face: Denies mucositis or sore throat Respiratory: Denies cough, dyspnea or wheezes Cardiovascular: Denies palpitation, chest discomfort or lower extremity swelling Gastrointestinal:  Denies nausea, heartburn or change in bowel habits Skin: Denies abnormal skin rashes Lymphatics: Denies new lymphadenopathy or easy bruising Neurological:Denies numbness, tingling or new weaknesses Behavioral/Psych: Mood is stable, no new changes  All other systems were reviewed with the patient and are negative.  PHYSICAL EXAMINATION:   Vitals:   08/24/23 1305  BP: 114/86  Pulse: 92  Resp: 18  Temp: 99 F (37.2 C)  SpO2: 97%    GENERAL:alert, no distress and comfortable LUNGS:  clear to auscultation and percussion with normal breathing effort HEART: regular rate & rhythm and no murmurs and no lower extremity edema ABDOMEN:abdomen soft, non-tender and normal bowel sounds Musculoskeletal:no cyanosis of digits and no clubbing  NEURO: alert & oriented x 3 with fluent speech, no focal motor/sensory deficits  LABORATORY DATA:  I have reviewed the data as listed  Lab Results  Component Value Date   WBC 13.5 (H) 08/16/2023   NEUTROABS 8.1 (H) 08/16/2023   HGB 16.0 (H) 08/16/2023   HCT 48.5 (H) 08/16/2023   MCV 94.4 08/16/2023   PLT 248 08/16/2023      Component Value Date/Time   NA 138 08/16/2023 0834   K 4.2 08/16/2023 0834   CL 105 08/16/2023 0834   CO2 23 08/16/2023 0834   GLUCOSE 157 (H) 08/16/2023 0834   BUN 18 08/16/2023 0834   CREATININE 1.08 (H) 08/16/2023 0834   CALCIUM 8.5 (L) 08/16/2023 0834   PROT 7.2 08/16/2023 0834   ALBUMIN 3.8 08/16/2023 0834   AST 26 08/16/2023 0834   ALT 46 (H)  08/16/2023 0834   ALKPHOS 79 08/16/2023 0834   BILITOT 0.4 08/16/2023 0834   GFRNONAA 58 (L) 08/16/2023 0834   GFRAA >60 11/18/2018 2221       Chemistry      Component Value Date/Time   NA 138 08/16/2023 0834   K 4.2 08/16/2023 0834   CL 105 08/16/2023 0834   CO2 23 08/16/2023 0834   BUN 18 08/16/2023 0834   CREATININE 1.08 (H) 08/16/2023 0834      Component Value Date/Time   CALCIUM 8.5 (L) 08/16/2023 0834   ALKPHOS 79 08/16/2023 0834   AST 26 08/16/2023 0834   ALT 46 (H) 08/16/2023 0834   BILITOT 0.4 08/16/2023 0834     Flow cytometry: DIAGNOSIS:   -No monoclonal B-cell population or significant T-cell abnormalities  identified.

## 2023-08-24 NOTE — Patient Instructions (Signed)
VISIT SUMMARY:  During today's visit, we discussed your ongoing symptoms of nausea, vomiting, and abdominal pain, as well as your recent recovery from COVID-19. We also reviewed your history of high white blood cell count and hemoglobin levels. We have outlined a plan to address these issues and will follow up in 4 weeks to review your progress.  YOUR PLAN:  -NAUSEA AND VOMITING: You have been experiencing chronic and severe nausea and vomiting, which is also causing abdominal pain. We have not yet identified the cause. We will refer you to a gastroenterologist, a specialist who can further evaluate and manage these symptoms.  -LEUKOCYTOSIS: Leukocytosis means having a high white blood cell count. Your initial tests for chronic leukemia were negative, but we need to rule out other forms of leukemia. We will order a PCR able test and encourage you to quit smoking, which may be contributing to this issue. Nicotine patches will be provided to help you quit smoking.  -POLYCYTHEMIA: Polycythemia means having a high level of hemoglobin in your blood. Your EPO levels are normal, but this could be related to undiagnosed sleep apnea. We will order a JAK2 test to rule out primary polycythemia and refer you to a sleep medicine specialist to evaluate for sleep apnea.  -POST-COVID-19 RECOVERY: You are still experiencing shortness of breath after recovering from COVID-19. Continue using your inhaler as prescribed to help manage this symptom.  INSTRUCTIONS:  Please follow up in 4 weeks to review your test results and assess your response to the specialist referrals.

## 2023-08-24 NOTE — Assessment & Plan Note (Signed)
Patient is a chronic smoker, smokes 2 packs/day -Counseled on smoking cessation -Patient stated that she has patches and did not require any at this time

## 2023-08-26 ENCOUNTER — Ambulatory Visit
Admission: RE | Admit: 2023-08-26 | Discharge: 2023-08-26 | Disposition: A | Discharge status: Home / Self Care | Payer: 59 | Source: Ambulatory Visit | Attending: Family Medicine | Admitting: Family Medicine

## 2023-08-26 DIAGNOSIS — G8929 Other chronic pain: Secondary | ICD-10-CM

## 2023-08-26 DIAGNOSIS — M546 Pain in thoracic spine: Secondary | ICD-10-CM

## 2023-08-26 MED ORDER — GADOPICLENOL 0.5 MMOL/ML IV SOLN
9.0000 mL | Freq: Once | INTRAVENOUS | Status: AC | PRN
  Administered 2023-08-26: 9 mL via INTRAVENOUS

## 2023-08-29 ENCOUNTER — Ambulatory Visit (INDEPENDENT_AMBULATORY_CARE_PROVIDER_SITE_OTHER): Payer: 59 | Admitting: Gastroenterology

## 2023-08-29 ENCOUNTER — Encounter: Payer: Self-pay | Admitting: Gastroenterology

## 2023-08-29 VITALS — BP 126/83 | HR 92 | Temp 98.8°F | Ht 61.5 in | Wt 179.8 lb

## 2023-08-29 DIAGNOSIS — K5909 Other constipation: Secondary | ICD-10-CM

## 2023-08-29 DIAGNOSIS — R112 Nausea with vomiting, unspecified: Secondary | ICD-10-CM

## 2023-08-29 DIAGNOSIS — R7401 Elevation of levels of liver transaminase levels: Secondary | ICD-10-CM

## 2023-08-29 DIAGNOSIS — Z800 Family history of malignant neoplasm of digestive organs: Secondary | ICD-10-CM | POA: Insufficient documentation

## 2023-08-29 DIAGNOSIS — R1013 Epigastric pain: Secondary | ICD-10-CM | POA: Insufficient documentation

## 2023-08-29 DIAGNOSIS — K219 Gastro-esophageal reflux disease without esophagitis: Secondary | ICD-10-CM

## 2023-08-29 DIAGNOSIS — K5900 Constipation, unspecified: Secondary | ICD-10-CM | POA: Insufficient documentation

## 2023-08-29 MED ORDER — PANTOPRAZOLE SODIUM 40 MG PO TBEC: 40.0000 mg | DELAYED_RELEASE_TABLET | Freq: Every day | ORAL | 3 refills | Status: DC

## 2023-08-29 MED ORDER — TRULANCE 3 MG PO TABS: 3.0000 mg | ORAL_TABLET | Freq: Every day | ORAL | 5 refills | Status: DC

## 2023-08-29 NOTE — Patient Instructions (Signed)
Start Trulance 3 mg daily for constipation.  Samples provided today to get started.  We have sent a prescription to your pharmacy.  Please call if you have any issues with coverage.  Start pantoprazole 40 mg daily before breakfast.  Prescription sent to your pharmacy.  This should help cover for acid reflux, gastritis, ulcers.  Try slowly increasing high-fiber foods to help with constipation.  Make sure you are getting adequate fluids throughout the day to maintain hydration. Recommend at least 90 ounces of caffeine free fluids daily.   Return to the office in 4 weeks for follow-up.  We will determine at that time whether or not you need to have upper endoscopy.  You are due for a colonoscopy at any time but right now we need to focus on getting her symptoms settle down.
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
## 2023-08-30 LAB — BCR-ABL1, CML/ALL, PCR, QUANT
E1A2 Transcript: <0.0032 %
Interpretation (BCRAL):: NEGATIVE
b2a2 transcript: <0.0032 %
b3a2 transcript: <0.0032 %

## 2023-09-01 LAB — CALR +MPL + E12-E15  (REFLEX)

## 2023-09-01 LAB — JAK2 V617F RFX CALR/MPL/E12-15

## 2023-09-08 ENCOUNTER — Telehealth: Payer: Self-pay | Admitting: Gastroenterology

## 2023-09-08 NOTE — Telephone Encounter (Signed)
Let pt know that I reviewed MRCP that her PCP ordered. Liver unremarkable. Nothing to explain GI symptoms. Keep upcoming ov.

## 2023-09-11 NOTE — Telephone Encounter (Signed)
Lmom for pt to return call. 

## 2023-09-13 NOTE — Telephone Encounter (Signed)
Pt was made aware and verbalized understanding.  

## 2023-09-22 ENCOUNTER — Other Ambulatory Visit: Payer: Self-pay

## 2023-09-22 ENCOUNTER — Inpatient Hospital Stay: Payer: 59 | Attending: Oncology | Admitting: Oncology

## 2023-09-22 VITALS — BP 128/80 | HR 92 | Temp 98.2°F | Resp 16 | Wt 181.7 lb

## 2023-09-22 DIAGNOSIS — F17210 Nicotine dependence, cigarettes, uncomplicated: Secondary | ICD-10-CM | POA: Diagnosis not present

## 2023-09-22 DIAGNOSIS — Z720 Tobacco use: Secondary | ICD-10-CM

## 2023-09-22 DIAGNOSIS — D72829 Elevated white blood cell count, unspecified: Secondary | ICD-10-CM | POA: Insufficient documentation

## 2023-09-22 DIAGNOSIS — D751 Secondary polycythemia: Secondary | ICD-10-CM | POA: Diagnosis not present

## 2023-09-22 DIAGNOSIS — D72825 Bandemia: Secondary | ICD-10-CM | POA: Diagnosis not present

## 2023-09-22 MED ORDER — ISOSORBIDE MONONITRATE ER 60 MG PO TB24: 60.0000 mg | ORAL_TABLET | Freq: Every day | ORAL | 3 refills | Status: DC

## 2023-09-22 MED ORDER — CYCLOBENZAPRINE HCL 5 MG PO TABS: 5.0000 mg | ORAL_TABLET | Freq: Every day | ORAL | 0 refills | Status: DC

## 2023-09-22 NOTE — Progress Notes (Signed)
[facility] at [facility] HEMATOLOGY FOLLOW-UP VISIT  Katelyn Found, MD  REASON FOR FOLLOW-UP: Leukocytosis and polycythemia  ASSESSMENT & PLAN:  Patient is a 62 year old female following for leukocytosis and polycythemia.  Leukocytosis Persistent, with neutrophil predominance. Possible association with smoking.  Negative flow cytometry.  No signs and symptoms of infection.  Negative BCR-ABL.  Normal LDH and CRP. -Encouraged smoking cessation -Repeat labs and follow-up in 6 weeks  Erythrocytosis Persistent, with normal EPO levels. Possible secondary to undiagnosed sleep apnea.  JAK2 testing is negative -Refer to sleep study last time, patient did not follow through.  Encouraged following up for sleep study. -Encouraged to quit smoking  Tobacco use Patient is a chronic smoker, smokes 2 packs/day -Counseled on smoking cessation -Patient stated that she has patches and did not require any at this time    Orders Placed This Encounter  Procedures   CBC with Differential/Platelet    Standing Status:   Future    Standing Expiration Date:   09/21/2024   Comprehensive metabolic panel    Standing Status:   Future    Standing Expiration Date:   09/21/2024   Sedimentation rate    Standing Status:   Future    Standing Expiration Date:   09/21/2024   ANA, IFA (with reflex)    Standing Status:   Future    Standing Expiration Date:   09/21/2024   Rheumatoid factor    Standing Status:   Future    Standing Expiration Date:   09/21/2024    The total time spent in the appointment was 15 minutes encounter with patients including review of chart and various tests results, discussions about plan of care and coordination of care plan   All questions were answered. The patient knows to call the clinic with any problems, questions or concerns. No barriers to learning was detected.  Cindie Crumbly, MD 11/22/202412:56 PM    INTERVAL HISTORY: Katelyn Kelly  62 y.o. female is here for follow-up for leukocytosis and polycythemia. She reports persistent back pain, previously diagnosed as a pinched nerve. Despite undergoing physical therapy, the pain has not subsided. The patient also reports a history of smoking. Patient did not do the sleep study yet and we reemphasized on the necessity of it.  In addition to the back pain, the patient has been experiencing symptoms of menopause, including hot flashes. She denies any recent weight loss and reports a stable appetite.  I have reviewed the past medical history, past surgical history, social history and family history with the patient   ALLERGIES:  is allergic to meprobamate, sinequan [doxepin hcl], tranxene [clorazepate dipotassium], albuterol, haloperidol, sertraline, vortioxetine, and elavil [amitriptyline].  MEDICATIONS:  Current Outpatient Medications  Medication Sig Dispense Refill   ACCU-CHEK GUIDE test strip      ALPRAZolam (XANAX) 1 MG tablet Take by mouth.     aspirin EC 81 MG tablet Take 1 tablet (81 mg total) by mouth daily. Swallow whole.     atorvastatin (LIPITOR) 80 MG tablet Take 1 tablet (80 mg total) by mouth at bedtime. (Patient taking differently: Take 40 mg by mouth at bedtime.) 90 tablet 1   busPIRone (BUSPAR) 7.5 MG tablet Take by mouth See admin instructions. Take 0.5 tablets (3.75 mg total) by mouth 3 times daily for 7 days, THEN 1 tablet (7.5 mg total) 3 times daily for 7 days, THEN 2 tablets (15 mg total) 3 times daily     calcium carbonate (TUMS -  DOSED IN MG ELEMENTAL CALCIUM) 500 MG chewable tablet Chew 1,000 mg by mouth daily as needed for indigestion or heartburn.     cyclobenzaprine (FLEXERIL) 5 MG tablet Take 1 tablet (5 mg total) by mouth at bedtime. 7 tablet 0   Fremanezumab-vfrm (AJOVY) 225 MG/1.5ML SOAJ Inject 225 mg into the skin every 30 (thirty) days. 1.68 mL 11   insulin lispro (HUMALOG) 100 UNIT/ML KwikPen Inject 2-4 Units into the skin See admin instructions.  Inject 2 to 4 units 3 times daily. May inject a 4th dose as needed for high blood sugar     ipratropium (ATROVENT HFA) 17 MCG/ACT inhaler Inhale 2 puffs into the lungs every 6 (six) hours as needed for wheezing. 1 each 0   Menthol, Topical Analgesic, (BIOFREEZE) 10 % LIQD Apply 1 application topically daily as needed (pain).     metoprolol tartrate (LOPRESSOR) 25 MG tablet TAKE (1/2) TABLET BY MOUTH TWICE DAILY 90 tablet 3   nitroGLYCERIN (NITROSTAT) 0.4 MG SL tablet PLACE ONE TABLET UNDER THE TOUNGE EVERY FIVE MINUTES FOR 3 DOSES AS NEEDED FOR CHEST PAINS. 25 tablet 3   NURTEC 75 MG TBDP TAKE ONE TABLET DAILY AS NEEDED. 8 tablet 6   ondansetron (ZOFRAN-ODT) 4 MG disintegrating tablet Take 1 tablet (4 mg total) by mouth every 8 (eight) hours as needed for nausea or vomiting. 30 tablet 2   OZEMPIC, 2 MG/DOSE, 8 MG/3ML SOPN Inject into the skin.     pantoprazole (PROTONIX) 40 MG tablet Take 1 tablet (40 mg total) by mouth daily before breakfast. 90 tablet 3   Plecanatide (TRULANCE) 3 MG TABS Take 1 tablet (3 mg total) by mouth daily. 30 tablet 5   pramipexole (MIRAPEX) 0.25 MG tablet Take 0.25 mg by mouth at bedtime.     SURE COMFORT PEN NEEDLES 32G X 4 MM MISC      TRESIBA FLEXTOUCH 100 UNIT/ML SOPN FlexTouch Pen Inject 20 Units into the skin 2 (two) times daily.     isosorbide mononitrate (IMDUR) 60 MG 24 hr tablet Take 1 tablet (60 mg total) by mouth daily. 90 tablet 3   No current facility-administered medications for this visit.     REVIEW OF SYSTEMS:   Constitutional: Denies fevers, chills or night sweats Eyes: Denies blurriness of vision Ears, nose, mouth, throat, and face: Denies mucositis or sore throat Respiratory: Denies cough, dyspnea or wheezes Cardiovascular: Denies palpitation, chest discomfort or lower extremity swelling Gastrointestinal:  Denies nausea, heartburn or change in bowel habits Skin: Denies abnormal skin rashes Lymphatics: Denies new lymphadenopathy or easy  bruising Neurological:Denies numbness, tingling or new weaknesses Behavioral/Psych: Mood is stable, no new changes  All other systems were reviewed with the patient and are negative.  PHYSICAL EXAMINATION:   Vitals:   09/22/23 0909  BP: 128/80  Pulse: 92  Resp: 16  Temp: 98.2 F (36.8 C)  SpO2: 96%    GENERAL:alert, no distress and comfortable LUNGS: clear to auscultation and percussion with normal breathing effort HEART: regular rate & rhythm and no murmurs and no lower extremity edema ABDOMEN:abdomen soft, non-tender and normal bowel sounds Musculoskeletal:no cyanosis of digits and no clubbing  NEURO: alert & oriented x 3 with fluent speech  LABORATORY DATA:  I have reviewed the data as listed  Lab Results  Component Value Date   WBC 13.5 (H) 08/16/2023   NEUTROABS 8.1 (H) 08/16/2023   HGB 16.0 (H) 08/16/2023   HCT 48.5 (H) 08/16/2023   MCV 94.4 08/16/2023   PLT  248 08/16/2023      Component Value Date/Time   NA 138 08/16/2023 0834   K 4.2 08/16/2023 0834   CL 105 08/16/2023 0834   CO2 23 08/16/2023 0834   GLUCOSE 157 (H) 08/16/2023 0834   BUN 18 08/16/2023 0834   CREATININE 1.08 (H) 08/16/2023 0834   CALCIUM 8.5 (L) 08/16/2023 0834   PROT 7.2 08/16/2023 0834   ALBUMIN 3.8 08/16/2023 0834   AST 26 08/16/2023 0834   ALT 46 (H) 08/16/2023 0834   ALKPHOS 79 08/16/2023 0834   BILITOT 0.4 08/16/2023 0834   GFRNONAA 58 (L) 08/16/2023 0834   GFRAA >60 11/18/2018 2221       Chemistry      Component Value Date/Time   NA 138 08/16/2023 0834   K 4.2 08/16/2023 0834   CL 105 08/16/2023 0834   CO2 23 08/16/2023 0834   BUN 18 08/16/2023 0834   CREATININE 1.08 (H) 08/16/2023 0834      Component Value Date/Time   CALCIUM 8.5 (L) 08/16/2023 0834   ALKPHOS 79 08/16/2023 0834   AST 26 08/16/2023 0834   ALT 46 (H) 08/16/2023 0834   BILITOT 0.4 08/16/2023 0834     Flow cytometry: DIAGNOSIS:   -No monoclonal B-cell population or significant T-cell  abnormalities  identified.    Latest Reference Range & Units 08/24/23 13:31  b2a2 transcript % <0.0032 %  b3a2 transcript % <0.0032 %  E1A2 Transcript % <0.0032 %  Interpretation (BCRAL):  Negative  Director Review (BCRAL):  Comment  CALR +MPL + E12-E15  (REFLEX)  Negative  BCR-ABL1, CML/ALL, PCR, QUANT  Negative  JAK2 V617F RFX CALR/MPL/E12-15  Negative  (C): Corrected Rpt: View report in Results Review for more information

## 2023-09-22 NOTE — Assessment & Plan Note (Signed)
Patient is a chronic smoker, smokes 2 packs/day -Counseled on smoking cessation -Patient stated that she has patches and did not require any at this time

## 2023-09-22 NOTE — Assessment & Plan Note (Addendum)
Persistent, with neutrophil predominance. Possible association with smoking.  Negative flow cytometry.  No signs and symptoms of infection.  Negative BCR-ABL.  Normal LDH and CRP. -Encouraged smoking cessation -Repeat labs and follow-up in 6 weeks

## 2023-09-22 NOTE — Assessment & Plan Note (Signed)
Persistent, with normal EPO levels. Possible secondary to undiagnosed sleep apnea.  JAK2 testing is negative -Refer to sleep study last time, patient did not follow through.  Encouraged following up for sleep study. -Encouraged to quit smoking

## 2023-09-25 ENCOUNTER — Institutional Professional Consult (permissible substitution): Payer: 59 | Admitting: Neurology

## 2023-09-25 NOTE — Progress Notes (Unsigned)
GI Office Note    Referring Provider: Gwenlyn Found, MD Primary Care Physician:  Gwenlyn Found, MD  Primary Gastroenterologist: Hennie Duos. Marletta Lor, DO   Chief Complaint   No chief complaint on file.   History of Present Illness   Katelyn Kelly is a 62 y.o. female presenting today for follow-up.  Last seen October 2024 for nausea/vomiting at the request of Dr.Kandala (hematology).  Patient with history of GERD, IBS, nausea/vomiting. A lot of constipation issues at time of office visit as well.  CTA chest/abdomen/pelvis in May 2024 for the same pain showing stable adrenal lesions dating back to at least 2021 with no further workup needed. Angiomyolipoma of the left kidney. No acute findings.   MR abdomen/MRCP August 26, 2023: IMPRESSION: 1. No MRI findings of the abdomen to explain abdominal pain or bloating. 2. Status post cholecystectomy. 3. Definitively benign, macroscopic fat containing bilateral adrenal adenomata and left renal angiomyolipomas, requiring no further follow-up or characterization.   EGD 04/2018: -small hiatal hernia -mild gastritis   Colonoscopy 08/2017: -five 3-4 mm polyps in rectum and sigmoid colon, hyperplastic -significant looping left colon -diverticulosis -internal hemorrhoids -external hemorrhoids -random colon biopsies normal -colonoscopy five years due to FH   Medications   Current Outpatient Medications  Medication Sig Dispense Refill   ACCU-CHEK GUIDE test strip      ALPRAZolam (XANAX) 1 MG tablet Take by mouth.     aspirin EC 81 MG tablet Take 1 tablet (81 mg total) by mouth daily. Swallow whole.     atorvastatin (LIPITOR) 80 MG tablet Take 1 tablet (80 mg total) by mouth at bedtime. (Patient taking differently: Take 40 mg by mouth at bedtime.) 90 tablet 1   busPIRone (BUSPAR) 7.5 MG tablet Take by mouth See admin instructions. Take 0.5 tablets (3.75 mg total) by mouth 3 times daily for 7 days, THEN 1 tablet (7.5 mg  total) 3 times daily for 7 days, THEN 2 tablets (15 mg total) 3 times daily     calcium carbonate (TUMS - DOSED IN MG ELEMENTAL CALCIUM) 500 MG chewable tablet Chew 1,000 mg by mouth daily as needed for indigestion or heartburn.     cyclobenzaprine (FLEXERIL) 5 MG tablet Take 1 tablet (5 mg total) by mouth at bedtime. 7 tablet 0   Fremanezumab-vfrm (AJOVY) 225 MG/1.5ML SOAJ Inject 225 mg into the skin every 30 (thirty) days. 1.68 mL 11   insulin lispro (HUMALOG) 100 UNIT/ML KwikPen Inject 2-4 Units into the skin See admin instructions. Inject 2 to 4 units 3 times daily. May inject a 4th dose as needed for high blood sugar     ipratropium (ATROVENT HFA) 17 MCG/ACT inhaler Inhale 2 puffs into the lungs every 6 (six) hours as needed for wheezing. 1 each 0   isosorbide mononitrate (IMDUR) 60 MG 24 hr tablet Take 1 tablet (60 mg total) by mouth daily. 90 tablet 3   Menthol, Topical Analgesic, (BIOFREEZE) 10 % LIQD Apply 1 application topically daily as needed (pain).     metoprolol tartrate (LOPRESSOR) 25 MG tablet TAKE (1/2) TABLET BY MOUTH TWICE DAILY 90 tablet 3   nitroGLYCERIN (NITROSTAT) 0.4 MG SL tablet PLACE ONE TABLET UNDER THE TOUNGE EVERY FIVE MINUTES FOR 3 DOSES AS NEEDED FOR CHEST PAINS. 25 tablet 3   NURTEC 75 MG TBDP TAKE ONE TABLET DAILY AS NEEDED. 8 tablet 6   ondansetron (ZOFRAN-ODT) 4 MG disintegrating tablet Take 1 tablet (4 mg total) by mouth every 8 (  eight) hours as needed for nausea or vomiting. 30 tablet 2   OZEMPIC, 2 MG/DOSE, 8 MG/3ML SOPN Inject into the skin.     pantoprazole (PROTONIX) 40 MG tablet Take 1 tablet (40 mg total) by mouth daily before breakfast. 90 tablet 3   Plecanatide (TRULANCE) 3 MG TABS Take 1 tablet (3 mg total) by mouth daily. 30 tablet 5   pramipexole (MIRAPEX) 0.25 MG tablet Take 0.25 mg by mouth at bedtime.     SURE COMFORT PEN NEEDLES 32G X 4 MM MISC      TRESIBA FLEXTOUCH 100 UNIT/ML SOPN FlexTouch Pen Inject 20 Units into the skin 2 (two) times daily.      No current facility-administered medications for this visit.    Allergies   Allergies as of 09/26/2023 - Review Complete 09/22/2023  Allergen Reaction Noted   Meprobamate Hives and Swelling 10/28/2020   Sinequan [doxepin hcl] Anaphylaxis 01/10/2012   Tranxene [clorazepate dipotassium] Hives and Swelling 01/10/2012   Albuterol Other (See Comments) 11/08/2017   Haloperidol Other (See Comments) 11/08/2017   Sertraline Other (See Comments) 11/08/2017   Vortioxetine Other (See Comments) 11/08/2017   Elavil [amitriptyline] Palpitations 05/20/2022     Past Medical History   Past Medical History:  Diagnosis Date   Anxiety and depression    Bilateral carpal tunnel syndrome    Chronic back pain    Chronic neck pain    Depression    Diabetes mellitus    GERD (gastroesophageal reflux disease)    H/O syncope    Headache    History of panic attacks    Hypertension    IBS (irritable bowel syndrome)    Manic depression (HCC)    Migraine    Panic attack     Past Surgical History   Past Surgical History:  Procedure Laterality Date   ABDOMINAL HYSTERECTOMY     ABDOMINAL SURGERY     APPENDECTOMY     BIOPSY  09/05/2017   Procedure: BIOPSY;  Surgeon: West Bali, MD;  Location: [facility];  Service: Endoscopy;;  random colon   BIOPSY  05/01/2018   Procedure: BIOPSY;  Surgeon: West Bali, MD;  Location: [facility];  Service: Endoscopy;;  gastric biopsy   CARPAL TUNNEL RELEASE Bilateral    CHOLECYSTECTOMY     COLONOSCOPY WITH PROPOFOL N/A 09/05/2017   Procedure: COLONOSCOPY WITH PROPOFOL;  Surgeon: West Bali, MD; normal TI, 5 small polyps, diverticulosis in the cecum, internal and external hemorrhoids.  Random colon biopsies benign.  Colon polyps were hyperplastic.  Repeat in 2023.   CORONARY BALLOON ANGIOPLASTY N/A 06/28/2021   Procedure: CORONARY BALLOON ANGIOPLASTY;  Surgeon: Tonny Bollman, MD;  Location: [facility] INVASIVE CV LAB;  Service: Cardiovascular;   Laterality: N/A;   DILATION AND CURETTAGE OF UTERUS     ESOPHAGOGASTRODUODENOSCOPY (EGD) WITH PROPOFOL N/A 05/01/2018   Procedure: ESOPHAGOGASTRODUODENOSCOPY (EGD) WITH PROPOFOL;  Surgeon: West Bali, MD;  normal esophagus, small hiatal hernia, mild gastritis s/p biopsy, normal duodenum.  No H. pylori.    HERNIA REPAIR     INCISIONAL HERNIA REPAIR N/A 11/06/2020   Procedure: HERNIA REPAIR INCISIONAL, PRIMARY REPAIR;  Surgeon: Lucretia Roers, MD;  Location: [facility];  Service: General;  Laterality: N/A;   LEFT HEART CATH AND CORONARY ANGIOGRAPHY N/A 06/28/2021   Procedure: LEFT HEART CATH AND CORONARY ANGIOGRAPHY;  Surgeon: Tonny Bollman, MD;  Location: [facility] INVASIVE CV LAB;  Service: Cardiovascular;  Laterality: N/A;   POLYPECTOMY  09/05/2017   Procedure:  POLYPECTOMY;  Surgeon: West Bali, MD;  Location: [facility];  Service: Endoscopy;;  sigmoid and rectal   TUBAL LIGATION      Past Family History   Family History  Problem Relation Age of Onset   Diabetes Mother    Stroke Mother    Depression Mother    Lung cancer Mother    Colon cancer Father 63       Passed away from colon cancer   Colon cancer Brother 27       Passed away from colon cancer   Bipolar disorder Son     Past Social History   Social History   Socioeconomic History   Marital status: Legally Separated    Spouse name: Not on file   Number of children: Not on file   Years of education: Not on file   Highest education level: Not on file  Occupational History   Not on file  Tobacco Use   Smoking status: Every Day    Current packs/day: 1.00    Average packs/day: 1 pack/day for 40.0 years (40.0 ttl pk-yrs)    Types: Cigarettes   Smokeless tobacco: Never   Tobacco comments:    one pack a day. Pt has purchased nicotine patches. She has a plan to stop and is going to set a quit date.   Vaping Use   Vaping status: Never Used  Substance and Sexual Activity   Alcohol use: No   Drug use: No   Sexual  activity: Not Currently    Birth control/protection: Surgical  Other Topics Concern   Not on file  Social History Narrative   Not on file   Social Determinants of Health   Financial Resource Strain: Not on file  Food Insecurity: Low Risk  (04/06/2023)   Received from [facility]   Hunger Vital Sign    Worried About Running Out of Food in the Last Year: Never true    Ran Out of Food in the Last Year: Never true  Transportation Needs: Not on file (04/06/2023)  Physical Activity: Not on file  Stress: Not on file  Social Connections: Not on file  Intimate Partner Violence: Not on file    Review of Systems   General: Negative for anorexia, weight loss, fever, chills, fatigue, weakness. ENT: Negative for hoarseness, difficulty swallowing , nasal congestion. CV: Negative for chest pain, angina, palpitations, dyspnea on exertion, peripheral edema.  Respiratory: Negative for dyspnea at rest, dyspnea on exertion, cough, sputum, wheezing.  GI: See history of present illness. GU:  Negative for dysuria, hematuria, urinary incontinence, urinary frequency, nocturnal urination.  Endo: Negative for unusual weight change.     Physical Exam   There were no vitals taken for this visit.   General: Well-nourished, well-developed in no acute distress.  Eyes: No icterus. Mouth: Oropharyngeal mucosa moist and pink , no lesions erythema or exudate. Lungs: Clear to auscultation bilaterally.  Heart: Regular rate and rhythm, no murmurs rubs or gallops.  Abdomen: Bowel sounds are normal, nontender, nondistended, no hepatosplenomegaly or masses,  no abdominal bruits or hernia , no rebound or guarding.  Rectal: ***  Extremities: No lower extremity edema. No clubbing or deformities. Neuro: Alert and oriented x 4   Skin: Warm and dry, no jaundice.   Psych: Alert and cooperative, normal mood and affect.  Labs   Lab Results  Component Value Date   NA 138 08/16/2023   CL 105 08/16/2023   K 4.2  08/16/2023  CO2 23 08/16/2023   BUN 18 08/16/2023   CREATININE 1.08 (H) 08/16/2023   GFRNONAA 58 (L) 08/16/2023   CALCIUM 8.5 (L) 08/16/2023   ALBUMIN 3.8 08/16/2023   GLUCOSE 157 (H) 08/16/2023   Lab Results  Component Value Date   ALT 46 (H) 08/16/2023   AST 26 08/16/2023   ALKPHOS 79 08/16/2023   BILITOT 0.4 08/16/2023   Lab Results  Component Value Date   WBC 13.5 (H) 08/16/2023   HGB 16.0 (H) 08/16/2023   HCT 48.5 (H) 08/16/2023   MCV 94.4 08/16/2023   PLT 248 08/16/2023   Lab Results  Component Value Date   CRP 0.7 07/19/2023   Lab Results  Component Value Date   ESRSEDRATE 0 07/19/2023    Imaging Studies   MR ABDOMEN WWO CONTRAST  Result Date: 09/02/2023 CLINICAL DATA:  Abdominal pain and bloating EXAM: MRI ABDOMEN WITHOUT AND WITH CONTRAST TECHNIQUE: Multiplanar multisequence MR imaging of the abdomen was performed both before and after the administration of intravenous contrast. CONTRAST:  Contrast information not available at the time of interpretation. Please see technologist documentation for complete details of contrast agent and intravenous administration. COMPARISON:  CT abdomen pelvis, 02/22/2023 FINDINGS: Lower chest: No acute abnormality. Hepatobiliary: No focal liver abnormality is seen. Status post cholecystectomy. No biliary dilatation. Pancreas: Unremarkable. No pancreatic ductal dilatation or surrounding inflammatory changes. Spleen: Normal in size without significant abnormality. Adrenals/Urinary Tract: Definitively benign, macroscopic fat containing bilateral adrenal adenomata, requiring no further follow-up or characterization (series 10, image 17). Tiny simple, benign bilateral renal cortical cysts, for which no further follow-up or characterization is required. Small, definitively benign macroscopic fat containing angiomyolipomas of the inferior pole of the left kidney, for which no further follow-up or characterization is required (series 10, image  29). Kidneys are otherwise normal, without renal calculi, solid lesion, or hydronephrosis. Stomach/Bowel: Stomach is within normal limits. No evidence of bowel wall thickening, distention, or inflammatory changes. Vascular/Lymphatic: Aortic atherosclerosis. No enlarged abdominal lymph nodes. Other: No abdominal wall hernia or abnormality. No ascites. Musculoskeletal: No acute or significant osseous findings. IMPRESSION: 1. No MRI findings of the abdomen to explain abdominal pain or bloating. 2. Status post cholecystectomy. 3. Definitively benign, macroscopic fat containing bilateral adrenal adenomata and left renal angiomyolipomas, requiring no further follow-up or characterization. Aortic Atherosclerosis (ICD10-I70.0). Electronically Signed   By: Jearld Lesch M.D.   On: 09/02/2023 22:18    Assessment       PLAN   ***   Leanna Battles. Melvyn Neth, MHS, PA-C [facility]

## 2023-09-26 ENCOUNTER — Encounter: Payer: Self-pay | Admitting: Gastroenterology

## 2023-09-26 ENCOUNTER — Ambulatory Visit (INDEPENDENT_AMBULATORY_CARE_PROVIDER_SITE_OTHER): Payer: 59 | Admitting: Gastroenterology

## 2023-09-26 ENCOUNTER — Other Ambulatory Visit: Payer: Self-pay | Admitting: *Deleted

## 2023-09-26 VITALS — BP 136/84 | HR 93 | Temp 97.8°F | Ht 61.5 in | Wt 181.0 lb

## 2023-09-26 DIAGNOSIS — R109 Unspecified abdominal pain: Secondary | ICD-10-CM

## 2023-09-26 DIAGNOSIS — R748 Abnormal levels of other serum enzymes: Secondary | ICD-10-CM

## 2023-09-26 DIAGNOSIS — K5909 Other constipation: Secondary | ICD-10-CM | POA: Diagnosis not present

## 2023-09-26 DIAGNOSIS — Z800 Family history of malignant neoplasm of digestive organs: Secondary | ICD-10-CM

## 2023-09-26 DIAGNOSIS — M6208 Separation of muscle (nontraumatic), other site: Secondary | ICD-10-CM | POA: Diagnosis not present

## 2023-09-26 DIAGNOSIS — K219 Gastro-esophageal reflux disease without esophagitis: Secondary | ICD-10-CM

## 2023-09-26 DIAGNOSIS — R1010 Upper abdominal pain, unspecified: Secondary | ICD-10-CM

## 2023-09-26 DIAGNOSIS — R112 Nausea with vomiting, unspecified: Secondary | ICD-10-CM

## 2023-09-26 DIAGNOSIS — K5900 Constipation, unspecified: Secondary | ICD-10-CM

## 2023-09-26 MED ORDER — LUBIPROSTONE 24 MCG PO CAPS: 24.0000 ug | ORAL_CAPSULE | Freq: Two times a day (BID) | ORAL | 5 refills | Status: DC

## 2023-09-26 NOTE — Patient Instructions (Signed)
Stop Trulance.  Start lubiprostone 24 mcg twice daily with food for constipation. You can also add MiraLAX 2 capfuls daily along with lubiprostone until you have a soft stool.  If needed you can continue MiraLAX 1 capful daily.  Sometimes patients need more than 1 medication to help with significant constipation.  Call with any questions or if your stools do not become more regular.  Goal would be to have soft stool 3-4 times per week at least. Continue pantoprazole 40 mg daily before breakfast for acid reflux Continue Zofran as needed for nausea/vomiting We will schedule you for a colonoscopy in January.  Please call if you are not having adequate bowel movements otherwise we will not be able to properly clean out your colon for colonoscopy. We will send you back to see Dr. Henreitta Leber to discuss ongoing pain at site of your prior hernia repair.

## 2023-10-07 ENCOUNTER — Emergency Department (HOSPITAL_COMMUNITY)
Admission: EM | Admit: 2023-10-07 | Discharge: 2023-10-07 | Disposition: A | Discharge status: Home / Self Care | Payer: 59 | Attending: Emergency Medicine | Admitting: Emergency Medicine

## 2023-10-07 ENCOUNTER — Other Ambulatory Visit: Payer: Self-pay

## 2023-10-07 ENCOUNTER — Emergency Department (HOSPITAL_COMMUNITY): Payer: 59

## 2023-10-07 ENCOUNTER — Encounter (HOSPITAL_COMMUNITY): Payer: Self-pay | Admitting: *Deleted

## 2023-10-07 DIAGNOSIS — M5442 Lumbago with sciatica, left side: Secondary | ICD-10-CM | POA: Insufficient documentation

## 2023-10-07 DIAGNOSIS — M5432 Sciatica, left side: Secondary | ICD-10-CM

## 2023-10-07 DIAGNOSIS — R930 Abnormal findings on diagnostic imaging of skull and head, not elsewhere classified: Secondary | ICD-10-CM | POA: Diagnosis not present

## 2023-10-07 DIAGNOSIS — Z7982 Long term (current) use of aspirin: Secondary | ICD-10-CM | POA: Insufficient documentation

## 2023-10-07 DIAGNOSIS — Z79899 Other long term (current) drug therapy: Secondary | ICD-10-CM | POA: Diagnosis not present

## 2023-10-07 DIAGNOSIS — Z794 Long term (current) use of insulin: Secondary | ICD-10-CM | POA: Diagnosis not present

## 2023-10-07 DIAGNOSIS — M545 Low back pain, unspecified: Secondary | ICD-10-CM | POA: Diagnosis present

## 2023-10-07 MED ORDER — METHOCARBAMOL 1000 MG/10ML IJ SOLN
1000.0000 mg | Freq: Once | INTRAMUSCULAR | Status: AC
  Administered 2023-10-07: 1000 mg via INTRAVENOUS
  Filled 2023-10-07: qty 10

## 2023-10-07 MED ORDER — DIAZEPAM 5 MG PO TABS
10.0000 mg | ORAL_TABLET | Freq: Once | ORAL | Status: AC
  Administered 2023-10-07: 10 mg via ORAL
  Filled 2023-10-07: qty 2

## 2023-10-07 MED ORDER — PREDNISONE 10 MG PO TABS
ORAL_TABLET | ORAL | 0 refills | Status: DC
Start: 2023-10-07 — End: 2023-10-17

## 2023-10-07 MED ORDER — OXYCODONE-ACETAMINOPHEN 5-325 MG PO TABS
2.0000 | ORAL_TABLET | Freq: Once | ORAL | Status: AC
  Administered 2023-10-07: 2 via ORAL
  Filled 2023-10-07: qty 2

## 2023-10-07 MED ORDER — HYDROMORPHONE HCL 1 MG/ML IJ SOLN
1.0000 mg | Freq: Once | INTRAMUSCULAR | Status: AC
  Administered 2023-10-07: 1 mg via INTRAVENOUS
  Filled 2023-10-07: qty 1

## 2023-10-07 MED ORDER — PREDNISONE 50 MG PO TABS
60.0000 mg | ORAL_TABLET | Freq: Once | ORAL | Status: AC
  Administered 2023-10-07: 60 mg via ORAL
  Filled 2023-10-07: qty 1

## 2023-10-07 MED ORDER — OXYCODONE-ACETAMINOPHEN 5-325 MG PO TABS
1.0000 | ORAL_TABLET | ORAL | 0 refills | Status: DC | PRN
Start: 2023-10-07 — End: 2023-12-18

## 2023-10-07 MED ORDER — METHOCARBAMOL 750 MG PO TABS: 750.0000 mg | ORAL_TABLET | Freq: Two times a day (BID) | ORAL | 0 refills | Status: DC

## 2023-10-07 MED ORDER — HYDROMORPHONE HCL 1 MG/ML IJ SOLN
0.5000 mg | Freq: Once | INTRAMUSCULAR | Status: AC
  Administered 2023-10-07: 0.5 mg via INTRAVENOUS
  Filled 2023-10-07: qty 0.5

## 2023-10-07 MED ORDER — KETOROLAC TROMETHAMINE 15 MG/ML IJ SOLN
15.0000 mg | Freq: Once | INTRAMUSCULAR | Status: AC
  Administered 2023-10-07: 15 mg via INTRAVENOUS
  Filled 2023-10-07: qty 1

## 2023-10-07 NOTE — Discharge Instructions (Addendum)
Follow up with your neurosurgeon, Dr. Franky Macho, to review the MRI obtained today and discuss options for treatment.   Take medications as prescribed today. Return to the ED with any new or concerning symptoms.

## 2023-10-07 NOTE — ED Notes (Signed)
Patient returned from MRI.

## 2023-10-07 NOTE — ED Provider Notes (Signed)
[place] EMERGENCY DEPARTMENT AT [facility] Provider Note   CSN: [id number] Arrival date & time: 10/07/23  1055     History  Chief Complaint  Patient presents with   Back Pain    Katelyn Kelly is a 62 y.o. female.  Patient to ED with severe pain in the left lower back radiating to her foot that started yesterday. No injury. She reports having recurrent back pain. No loss of bowel or bladder control. No weakness or numbness. No abdominal pain.   The history is provided by the patient. No language interpreter was used.  Back Pain      Home Medications Prior to Admission medications   Medication Sig Start Date End Date Taking? Authorizing Provider  methocarbamol (ROBAXIN) 750 MG tablet Take 1 tablet (750 mg total) by mouth 2 (two) times daily. 10/07/23  Yes Elpidio Anis, PA-C  oxyCODONE-acetaminophen (PERCOCET/ROXICET) 5-325 MG tablet Take 1-2 tablets by mouth every 4 (four) hours as needed for severe pain (pain score 7-10). 10/07/23  Yes Irvin Bastin, Melvenia Beam, PA-C  predniSONE (DELTASONE) 10 MG tablet Take 6 tablets on day 2, tomorrow Take 5 tablets on days 3 and 4 Take 4 tablet on days 5 and 6 Take 3 tablets on days 7 and 8 Take 2 tablets on days 9 and 10 Take 1 tablet on days 11 ans 12 10/07/23  Yes Elpidio Anis, PA-C  ACCU-CHEK GUIDE test strip  06/29/23   [provider]  ALPRAZolam Prudy Feeler) 1 MG tablet Take by mouth. 05/26/23 05/25/24  [provider]  aspirin EC 81 MG tablet Take 1 tablet (81 mg total) by mouth daily. Swallow whole. 05/20/22   Antoine Poche, MD  atorvastatin (LIPITOR) 80 MG tablet Take 1 tablet (80 mg total) by mouth at bedtime. Patient taking differently: Take 40 mg by mouth at bedtime. 06/29/21   Arty Baumgartner, NP  busPIRone (BUSPAR) 7.5 MG tablet Take by mouth See admin instructions. Take 0.5 tablets (3.75 mg total) by mouth 3 times daily for 7 days, THEN 1 tablet (7.5 mg total) 3 times daily for 7 days, THEN 2 tablets (15  mg total) 3 times daily 06/22/21   [provider]  calcium carbonate (TUMS - DOSED IN MG ELEMENTAL CALCIUM) 500 MG chewable tablet Chew 1,000 mg by mouth daily as needed for indigestion or heartburn.    [provider]  cyclobenzaprine (FLEXERIL) 5 MG tablet Take 1 tablet (5 mg total) by mouth at bedtime. Patient not taking: Reported on 09/26/2023 09/22/23   Cindie Crumbly, MD  Fremanezumab-vfrm (AJOVY) 225 MG/1.5ML SOAJ Inject 225 mg into the skin every 30 (thirty) days. 07/27/23   Ihor Austin, NP  insulin lispro (HUMALOG) 100 UNIT/ML KwikPen Inject 2-4 Units into the skin See admin instructions. Inject 2 to 4 units 3 times daily. May inject a 4th dose as needed for high blood sugar 03/09/19   [provider]  ipratropium (ATROVENT HFA) 17 MCG/ACT inhaler Inhale 2 puffs into the lungs every 6 (six) hours as needed for wheezing. 08/10/23   Leath-Warren, Sadie Haber, NP  isosorbide mononitrate (IMDUR) 60 MG 24 hr tablet Take 1 tablet (60 mg total) by mouth daily. 09/22/23   Antoine Poche, MD  lubiprostone (AMITIZA) 24 MCG capsule Take 1 capsule (24 mcg total) by mouth 2 (two) times daily with a meal. 09/26/23   Tiffany Kocher, PA-C  Menthol, Topical Analgesic, (BIOFREEZE) 10 % LIQD Apply 1 application topically daily as needed (pain).  [provider]  metoprolol tartrate (LOPRESSOR) 25 MG tablet TAKE (1/2) TABLET BY MOUTH TWICE DAILY 07/27/23   Antoine Poche, MD  nitroGLYCERIN (NITROSTAT) 0.4 MG SL tablet PLACE ONE TABLET UNDER THE TOUNGE EVERY FIVE MINUTES FOR 3 DOSES AS NEEDED FOR CHEST PAINS. 07/18/22   Antoine Poche, MD  NURTEC 75 MG TBDP TAKE ONE TABLET DAILY AS NEEDED. 08/07/23   Penumalli, Glenford Bayley, MD  ondansetron (ZOFRAN-ODT) 4 MG disintegrating tablet Take 1 tablet (4 mg total) by mouth every 8 (eight) hours as needed for nausea or vomiting. 05/28/20   Ermalinda Memos S, PA-C  OZEMPIC, 2 MG/DOSE, 8 MG/3ML SOPN Inject into the skin.     [provider]  pantoprazole (PROTONIX) 40 MG tablet Take 1 tablet (40 mg total) by mouth daily before breakfast. 08/29/23   Tiffany Kocher, PA-C  pramipexole (MIRAPEX) 0.25 MG tablet Take 0.25 mg by mouth at bedtime. 05/01/21   [provider]  SURE COMFORT PEN NEEDLES 32G X 4 MM MISC  06/26/23   [provider]  TRESIBA FLEXTOUCH 100 UNIT/ML SOPN FlexTouch Pen Inject 20 Units into the skin 2 (two) times daily. 03/20/19   [provider]      Allergies    Meprobamate, Sinequan [doxepin hcl], Tranxene [clorazepate dipotassium], Albuterol, Haloperidol, Sertraline, Vortioxetine, and Elavil [amitriptyline]    Review of Systems   Review of Systems  Musculoskeletal:  Positive for back pain.    Physical Exam Updated Vital Signs BP 130/70 (BP Location: Right Arm)   Pulse 82   Temp 98.3 F (36.8 C) (Oral)   Resp 20   Ht 5' 1.5" (1.562 m)   Wt 92.5 kg   SpO2 98%   BMI 37.92 kg/m  Physical Exam Vitals and nursing note reviewed.  Constitutional:      Appearance: Normal appearance.  Cardiovascular:     Rate and Rhythm: Normal rate.  Pulmonary:     Effort: Pulmonary effort is normal.  Abdominal:     Tenderness: There is no abdominal tenderness.  Musculoskeletal:        General: Tenderness (Tender to left paralumbar back without swelling. Tenderness extends to left lateral thigh.) present. Normal range of motion.  Neurological:     Mental Status: She is alert.     Sensory: No sensory deficit.     Deep Tendon Reflexes: Reflexes normal.     ED Results / Procedures / Treatments   Labs (all labs ordered are listed, but only abnormal results are displayed) Labs Reviewed - No data to display  EKG None  Radiology MR LUMBAR SPINE WO CONTRAST  Result Date: 10/07/2023 CLINICAL DATA:  severe left radicular pain EXAM: MRI LUMBAR SPINE WITHOUT CONTRAST TECHNIQUE: Multiplanar, multisequence MR imaging of the lumbar spine was performed. No intravenous  contrast was administered. COMPARISON:  None Available. FINDINGS: Segmentation:  Standard. Alignment: Trace anterolisthesis of L4 on L5 and retrolisthesis of L5 on S1. Vertebrae: No fracture, evidence of discitis, or bone lesion. Mild T2 hyperintense signal abnormality in the bilateral pedicles at L5 could be seen in the setting of stress reaction. Conus medullaris and cauda equina: Conus extends to the L2 level. Conus and cauda equina appear normal. Paraspinal and other soft tissues: Redemonstrated are bilateral adrenal lesions, which were previously characterized to be benign. Disc levels: T12-L1: Minimal disc bulge. No spinal canal or neural foraminal narrowing. L1-L2: Mild bilateral facet degenerative change. Minimal disc bulge. No spinal canal narrowing. No neural foraminal narrowing. L2-L3: Mild bilateral  facet degenerative change. Minimal disc bulge. Mild spinal canal narrowing. No significant neural foraminal narrowing. L3-L4: Moderate bilateral facet degenerative change. Ligamentum flavum hypertrophy. Circumferential disc bulge with a superimposed left paracentral disc protrusion that narrows the left lateral recess (series 8, image 25). Moderate bilateral neural foraminal narrowing. L4-L5: Severe bilateral facet degenerative change. Circumferential disc bulge. Severe spinal canal narrowing. Mild right and moderate left neural foraminal narrowing. The extraforaminal component of the disc bulge contacts the exited left L4 nerve root. L5-S1: Central disc protrusion. No overall spinal canal narrowing. Moderate left and mild right facet degenerative change. Moderate bilateral facet degenerative change, left-greater-than-right. IMPRESSION: 1. Severe spinal canal narrowing at L4-L5 secondary to combination of ligamentum flavum hypertrophy and circumferential disc bulge. The extraforaminal component of the disc bulge contacts the exited left L4 nerve root. 2. Moderate left neural foraminal narrowing at L3-L4 and  L4-L5. 3. Mild T2 hyperintense signal abnormality in the bilateral pedicles at L5 could be seen in the setting of stress reaction. Electronically Signed   By: Lorenza Cambridge M.D.   On: 10/07/2023 15:57    Procedures Procedures    Medications Ordered in ED Medications  oxyCODONE-acetaminophen (PERCOCET/ROXICET) 5-325 MG per tablet 2 tablet (has no administration in time range)  predniSONE (DELTASONE) tablet 60 mg (has no administration in time range)  HYDROmorphone (DILAUDID) injection 1 mg (1 mg Intravenous Given 10/07/23 1229)  ketorolac (TORADOL) 15 MG/ML injection 15 mg (15 mg Intravenous Given 10/07/23 1229)  methocarbamol (ROBAXIN) injection 1,000 mg (1,000 mg Intravenous Given 10/07/23 1235)  HYDROmorphone (DILAUDID) injection 0.5 mg (0.5 mg Intravenous Given 10/07/23 1456)  diazepam (VALIUM) tablet 10 mg (10 mg Oral Given 10/07/23 1456)    ED Course/ Medical Decision Making/ A&P Clinical Course as of 10/07/23 1709  Sat Oct 07, 2023  1348 Discussed getting a MRI lumbar spine today, however, the patient reports she has to have the open MRI secondary to claustrophobia.  [SU]  1420 The patient was up to the bathroom and pain became severe once again. She will attempt MRI with Valium for sedation.   [SU]  1706 Patient with severe low left back pain radiating to left outer thigh. No known injury. No neurologic deficit on exam.  Pain addressed in the ED. MRI showing disc bulge affecting L4 nerve root. She has seen Dr. Franky Macho in the past and so I will refer her back to him.  [SU]    Clinical Course User Index [SU] Elpidio Anis, PA-C                                 Medical Decision Making Amount and/or Complexity of Data Reviewed Radiology: ordered.  Risk Prescription drug management.           Final Clinical Impression(s) / ED Diagnoses Final diagnoses:  Sciatica of left side    Rx / DC Orders ED Discharge Orders          Ordered    oxyCODONE-acetaminophen  (PERCOCET/ROXICET) 5-325 MG tablet  Every 4 hours PRN        10/07/23 1703    predniSONE (DELTASONE) 10 MG tablet        10/07/23 1703    methocarbamol (ROBAXIN) 750 MG tablet  2 times daily        10/07/23 1703              Elpidio Anis, PA-C 10/07/23 1709    Vanetta Mulders,  MD 10/08/23 1610

## 2023-10-07 NOTE — ED Notes (Signed)
Patient transported to MRI 

## 2023-10-07 NOTE — ED Triage Notes (Signed)
Pt with left lower back pain that radiates down left leg to foot.  Constant pain.  Started yesterday, denies any falls or injuries.

## 2023-10-09 ENCOUNTER — Telehealth: Payer: Self-pay | Admitting: *Deleted

## 2023-10-09 NOTE — Telephone Encounter (Signed)
Called pt to schedule TCS with Dr. Marletta Lor, ASA 3 in January. She stated she has 2 upcoming surgical appointments that she needs to see what is going to happen prior to scheduling this. She is going to call us once she is ready to schedule her TCS.

## 2023-10-17 ENCOUNTER — Encounter: Payer: Self-pay | Admitting: General Surgery

## 2023-10-17 ENCOUNTER — Ambulatory Visit: Payer: 59 | Admitting: General Surgery

## 2023-10-17 VITALS — BP 120/75 | HR 82 | Temp 98.1°F | Resp 16 | Ht 61.5 in | Wt 184.0 lb

## 2023-10-17 DIAGNOSIS — M6208 Separation of muscle (nontraumatic), other site: Secondary | ICD-10-CM | POA: Diagnosis not present

## 2023-10-17 NOTE — Patient Instructions (Signed)
You can look at [facility] for diastasis recti- a lot of these are post partum videos.  These can help with diastasis recti and pain.

## 2023-10-17 NOTE — Progress Notes (Unsigned)
[facility] Note   HPI:  62 y.o. Female presents to clinic for follow-up evaluation of her abdomen due to concern she has a hernia. Patient reports noticing a bulge in the area of her upper abdomen and pain. She had prior hernia repair at the medial edge of her RUQ incision from a gallbladder surgery. I repaired this primarily.   Review of Systems:  Pain in the upper abdomen  All other review of systems: otherwise negative   Vital Signs:  BP 120/75   Pulse 82   Temp 98.1 F (36.7 C) (Oral)   Resp 16   Ht 5' 1.5" (1.562 m)   Wt 184 lb (83.5 kg)   SpO2 94%   BMI 34.20 kg/m    Physical Exam:  Physical Exam Cardiovascular:     Rate and Rhythm: Normal rate.  Pulmonary:     Effort: Pulmonary effort is normal.  Abdominal:     General: There is no distension.     Palpations: Abdomen is soft. There is no mass.     Tenderness: There is no abdominal tenderness.     Hernia: No hernia is present.     Comments: Diastasis recti of the upper abdomen, no obvious hernia  Neurological:     Mental Status: She is alert.     Imaging:  Reviewed CT from 03/2023 and 4/ 2024- no signs of hernia in the upper abdomen, diastasis present, umbilical stalk noted 1cm area, but nothing in the defect    Assessment:  62 y.o. yo Female with no hernia noted on the upper abdomen exam or imaging. There is diastasis that could contribute to pain. Warned diastasis can turn into a hernia.   Plan:  You can look at [facility] for diastasis recti- a lot of these are post partum videos.  These can help with diastasis recti and pain.   Algis Greenhouse, MD [facility] [address] [place], [place] [address] [phone] (office)

## 2023-11-08 ENCOUNTER — Inpatient Hospital Stay: Payer: 59

## 2023-11-15 ENCOUNTER — Inpatient Hospital Stay: Payer: 59 | Admitting: Oncology

## 2023-11-22 ENCOUNTER — Inpatient Hospital Stay: Payer: 59 | Admitting: Oncology

## 2023-12-04 ENCOUNTER — Other Ambulatory Visit (HOSPITAL_COMMUNITY): Payer: Self-pay

## 2023-12-04 ENCOUNTER — Telehealth: Payer: Self-pay | Admitting: Pharmacy Technician

## 2023-12-04 NOTE — Telephone Encounter (Signed)
Pharmacy Patient Advocate Encounter   Received notification from CoverMyMeds that prior authorization for Nurtec 75MG  dispersible tablets is required/requested.   Insurance verification completed.   The patient is insured through [facility] .   Per test claim: The current 30 day co-pay is, $0.00.  No PA needed at this time. This test claim was processed through [facility]- copay amounts may vary at other pharmacies due to pharmacy/plan contracts, or as the patient moves through the different stages of their insurance plan.

## 2023-12-06 ENCOUNTER — Other Ambulatory Visit (HOSPITAL_COMMUNITY): Payer: Self-pay | Admitting: Family Medicine

## 2023-12-06 DIAGNOSIS — R053 Chronic cough: Secondary | ICD-10-CM

## 2023-12-07 ENCOUNTER — Ambulatory Visit: Payer: 59 | Attending: Cardiology | Admitting: Cardiology

## 2023-12-07 ENCOUNTER — Encounter: Payer: Self-pay | Admitting: Cardiology

## 2023-12-07 VITALS — BP 132/64 | HR 86 | Ht 61.5 in | Wt 190.0 lb

## 2023-12-07 DIAGNOSIS — E782 Mixed hyperlipidemia: Secondary | ICD-10-CM

## 2023-12-07 DIAGNOSIS — I10 Essential (primary) hypertension: Secondary | ICD-10-CM

## 2023-12-07 DIAGNOSIS — I2510 Atherosclerotic heart disease of native coronary artery without angina pectoris: Secondary | ICD-10-CM | POA: Diagnosis not present

## 2023-12-07 DIAGNOSIS — R600 Localized edema: Secondary | ICD-10-CM

## 2023-12-07 DIAGNOSIS — R609 Edema, unspecified: Secondary | ICD-10-CM | POA: Diagnosis not present

## 2023-12-07 MED ORDER — FUROSEMIDE 20 MG PO TABS: 20.0000 mg | ORAL_TABLET | Freq: Every day | ORAL | 3 refills | Status: DC | PRN

## 2023-12-07 NOTE — Patient Instructions (Signed)
 Medication Instructions:  Your physician has recommended you make the following change in your medication:   -Start Lasix  20 mg as needed for swelling.  *If you need a refill on your cardiac medications before your next appointment, please call your pharmacy*   Lab Work: Fasting Lipid Panel  If you have labs (blood work) drawn today and your tests are completely normal, you will receive your results only by: MyChart Message (if you have MyChart) OR A paper copy in the mail If you have any lab test that is abnormal or we need to change your treatment, we will call you to review the results.   Testing/Procedures: None   Follow-Up: At [facility], you and your health needs are our priority.  As part of our continuing mission to provide you with exceptional heart care, we have created designated Provider Care Teams.  These Care Teams include your primary Cardiologist (physician) and Advanced Practice Providers (APPs -  Physician Assistants and Nurse Practitioners) who all work together to provide you with the care you need, when you need it.  We recommend signing up for the patient portal called MyChart.  Sign up information is provided on this After Visit Summary.  MyChart is used to connect with patients for Virtual Visits (Telemedicine).  Patients are able to view lab/test results, encounter notes, upcoming appointments, etc.  Non-urgent messages can be sent to your provider as well.   To learn more about what you can do with MyChart, go to [email or web address].    Your next appointment:   6 month(s)  Provider:   You may see Alvan Carrier, MD or one of the following Advanced Practice Providers on your designated Care Team:   Laymon Qua, PA-C  Olivia Pavy, [place]     Other Instructions

## 2023-12-07 NOTE — Progress Notes (Signed)
 Clinical Summary Katelyn Kelly is a 63 y.o.female seen for the following medical problems.   1.CAD - admitted 05/2021 with NSTEMI - 05/2021 echo LVEF 60-65%, grade I dd - 05/2021 95% ramus, received PTCA only.        - at prior visit she reported sharp/pressure midchest. Could occur at rest or with exertion. 7-8/10 in severity. +SOB, panic like feeling. Radiated in bewteen shoulder pain. Would last few minutes. Could awake her from sleep. Last episode was a few weeks ago, has improved with cardiac rehab. Would improve with NG - also has reported left neck pain radiates down into shoulder, arm, and left hand. Tightness/tinging feeling. Not positional. Can last all day long.       -ER visit 04/18/22 with chest pain - trop neg x 2, EKG benign.  - pain improved with xanax      - recent left sided chest pains, primarily radiate down left arm.  - can occur at rest or with activity. Lasts a few hours constant. Somewhat atypical but reports similar to pain she had when she had her MI.  Some recent DOE that has progressed over the last few weeks.     *did not got for stress test since last visit  - no recent chest pains. Some SOB and cough related to recent cold.      2.DM2 - followed by pcp     3. Hyperlipidemia    Jan 2023 TC 122 TG 125 HDL 53 LDL 44 -she is compliant with meds - labs per pcp   4. Anxiety/depression   5. Migraine headaches - followed by Dr Leila   6. Palpitations 12/2022 monitor: rare ectopy, 2 runs SVT longest 10 beats - mild symptoms , she is on lopressor   - 2-3 times a week, lasts a few minutes - recent cold, and recent steroid course.    7. LE edema - increased LE edema.  - recent cold, steroid course.     Past Medical History:  Diagnosis Date   Anxiety and depression    Bilateral carpal tunnel syndrome    Chronic back pain    Chronic neck pain    Depression    Diabetes mellitus    GERD (gastroesophageal reflux disease)    H/O syncope     Headache    History of panic attacks    Hypertension    IBS (irritable bowel syndrome)    Manic depression (HCC)    Migraine    Panic attack      Allergies  Allergen Reactions   Meprobamate Hives and Swelling   Sinequan [Doxepin Hcl] Anaphylaxis   Tranxene [Clorazepate Dipotassium] Hives and Swelling   Albuterol  Other (See Comments)    Patient does not remember the reaction. This was a record provided via [facility]   Haloperidol Other (See Comments)    Patient does not remember the reaction. This was a record provided via [facility]   Sertraline Other (See Comments)    Patient does not remember the reaction. This was a record provided via [facility]   Vortioxetine Other (See Comments)    Patient does not remember the reaction. This was a record provided via [facility]   Elavil [Amitriptyline] Palpitations     Current Outpatient Medications  Medication Sig Dispense Refill   ACCU-CHEK GUIDE test strip      ALPRAZolam  (XANAX ) 1 MG tablet Take by mouth.     aspirin  EC 81 MG tablet Take 1 tablet (  81 mg total) by mouth daily. Swallow whole.     atorvastatin  (LIPITOR ) 80 MG tablet Take 1 tablet (80 mg total) by mouth at bedtime. (Patient taking differently: Take 40 mg by mouth at bedtime.) 90 tablet 1   busPIRone  (BUSPAR ) 7.5 MG tablet Take by mouth See admin instructions. Take 0.5 tablets (3.75 mg total) by mouth 3 times daily for 7 days, THEN 1 tablet (7.5 mg total) 3 times daily for 7 days, THEN 2 tablets (15 mg total) 3 times daily     calcium  carbonate (TUMS - DOSED IN MG ELEMENTAL CALCIUM ) 500 MG chewable tablet Chew 1,000 mg by mouth daily as needed for indigestion or heartburn.     cyclobenzaprine  (FLEXERIL ) 5 MG tablet Take 1 tablet (5 mg total) by mouth at bedtime. (Patient taking differently: Take 5 mg by mouth 3 (three) times daily as needed.) 7 tablet 0   Fremanezumab -vfrm (AJOVY ) 225 MG/1.5ML SOAJ Inject 225 mg into the skin every 30 (thirty)  days. 1.68 mL 11   insulin  lispro (HUMALOG ) 100 UNIT/ML KwikPen Inject 2-4 Units into the skin See admin instructions. Inject 2 to 4 units 3 times daily. May inject a 4th dose as needed for high blood sugar     ipratropium (ATROVENT  HFA) 17 MCG/ACT inhaler Inhale 2 puffs into the lungs every 6 (six) hours as needed for wheezing. 1 each 0   isosorbide  mononitrate (IMDUR ) 60 MG 24 hr tablet Take 1 tablet (60 mg total) by mouth daily. 90 tablet 3   lubiprostone  (AMITIZA ) 24 MCG capsule Take 1 capsule (24 mcg total) by mouth 2 (two) times daily with a meal. 60 capsule 5   Menthol, Topical Analgesic, (BIOFREEZE) 10 % LIQD Apply 1 application topically daily as needed (pain).     methocarbamol  (ROBAXIN ) 750 MG tablet Take 1 tablet (750 mg total) by mouth 2 (two) times daily. 10 tablet 0   metoprolol  tartrate (LOPRESSOR ) 25 MG tablet TAKE (1/2) TABLET BY MOUTH TWICE DAILY 90 tablet 3   nitroGLYCERIN  (NITROSTAT ) 0.4 MG SL tablet PLACE ONE TABLET UNDER THE TOUNGE EVERY FIVE MINUTES FOR 3 DOSES AS NEEDED FOR CHEST PAINS. 25 tablet 3   NURTEC 75 MG TBDP TAKE ONE TABLET DAILY AS NEEDED. 8 tablet 6   ondansetron  (ZOFRAN -ODT) 4 MG disintegrating tablet Take 1 tablet (4 mg total) by mouth every 8 (eight) hours as needed for nausea or vomiting. 30 tablet 2   oxyCODONE -acetaminophen  (PERCOCET/ROXICET) 5-325 MG tablet Take 1-2 tablets by mouth every 4 (four) hours as needed for severe pain (pain score 7-10). 20 tablet 0   OZEMPIC, 2 MG/DOSE, 8 MG/3ML SOPN Inject into the skin. (Patient not taking: Reported on 10/17/2023)     pantoprazole  (PROTONIX ) 40 MG tablet Take 1 tablet (40 mg total) by mouth daily before breakfast. 90 tablet 3   pramipexole  (MIRAPEX ) 0.25 MG tablet Take 0.25 mg by mouth at bedtime.     SURE COMFORT PEN NEEDLES 32G X 4 MM MISC      TRESIBA  FLEXTOUCH 100 UNIT/ML SOPN FlexTouch Pen Inject 20 Units into the skin 2 (two) times daily.     No current facility-administered medications for this visit.      Past Surgical History:  Procedure Laterality Date   ABDOMINAL HYSTERECTOMY     ABDOMINAL SURGERY     APPENDECTOMY     BIOPSY  09/05/2017   Procedure: BIOPSY;  Surgeon: Harvey Margo CROME, MD;  Location: [facility];  Service: Endoscopy;;  random colon  BIOPSY  05/01/2018   Procedure: BIOPSY;  Surgeon: Harvey Margo CROME, MD;  Location: [facility];  Service: Endoscopy;;  gastric biopsy   CARPAL TUNNEL RELEASE Bilateral    CHOLECYSTECTOMY     COLONOSCOPY WITH PROPOFOL  N/A 09/05/2017   Procedure: COLONOSCOPY WITH PROPOFOL ;  Surgeon: Harvey Margo CROME, MD; normal TI, 5 small polyps, diverticulosis in the cecum, internal and external hemorrhoids.  Random colon biopsies benign.  Colon polyps were hyperplastic.  Repeat in 2023.   CORONARY BALLOON ANGIOPLASTY N/A 06/28/2021   Procedure: CORONARY BALLOON ANGIOPLASTY;  Surgeon: Wonda Sharper, MD;  Location: [facility] INVASIVE CV LAB;  Service: Cardiovascular;  Laterality: N/A;   DILATION AND CURETTAGE OF UTERUS     ESOPHAGOGASTRODUODENOSCOPY (EGD) WITH PROPOFOL  N/A 05/01/2018   Procedure: ESOPHAGOGASTRODUODENOSCOPY (EGD) WITH PROPOFOL ;  Surgeon: Harvey Margo CROME, MD;  normal esophagus, small hiatal hernia, mild gastritis s/p biopsy, normal duodenum.  No H. pylori.    HERNIA REPAIR     INCISIONAL HERNIA REPAIR N/A 11/06/2020   Procedure: HERNIA REPAIR INCISIONAL, PRIMARY REPAIR;  Surgeon: Kallie Manuelita BROCKS, MD;  Location: [facility];  Service: General;  Laterality: N/A;   LEFT HEART CATH AND CORONARY ANGIOGRAPHY N/A 06/28/2021   Procedure: LEFT HEART CATH AND CORONARY ANGIOGRAPHY;  Surgeon: Wonda Sharper, MD;  Location: [facility] INVASIVE CV LAB;  Service: Cardiovascular;  Laterality: N/A;   POLYPECTOMY  09/05/2017   Procedure: POLYPECTOMY;  Surgeon: Harvey Margo CROME, MD;  Location[facility];  Service: Endoscopy;;  sigmoid and rectal   TUBAL LIGATION       Allergies  Allergen Reactions   Meprobamate Hives and Swelling   Sinequan [Doxepin Hcl] Anaphylaxis    Tranxene [Clorazepate Dipotassium] Hives and Swelling   Albuterol  Other (See Comments)    Patient does not remember the reaction. This was a record provided via [facility]   Haloperidol Other (See Comments)    Patient does not remember the reaction. This was a record provided via [facility]   Sertraline Other (See Comments)    Patient does not remember the reaction. This was a record provided via [facility]   Vortioxetine Other (See Comments)    Patient does not remember the reaction. This was a record provided via [facility]   Elavil [Amitriptyline] Palpitations      Family History  Problem Relation Age of Onset   Diabetes Mother    Stroke Mother    Depression Mother    Lung cancer Mother    Colon cancer Father 47       Passed away from colon cancer   Colon cancer Brother 41       Passed away from colon cancer   Bipolar disorder Son      Social History Ms. Izzo reports that she has been smoking cigarettes. She has a 40 pack-year smoking history. She has never used smokeless tobacco. Ms. Starkes reports no history of alcohol use.    Physical Examination Today's Vitals   12/07/23 1052  BP: 132/64  Pulse: 86  SpO2: 98%  Weight: 190 lb (86.2 kg)  Height: 5' 1.5 (1.562 m)   Body mass index is 35.32 kg/m.  Gen: resting comfortably, no acute distress HEENT: no scleral icterus, pupils equal round and reactive, no palptable cervical adenopathy,  CV: RRR, no mrg, no jvd Resp: Clear to auscultation bilaterally GI: abdomen is soft, non-tender, non-distended, normal bowel sounds, no hepatosplenomegaly MSK: extremities are warm, 1+ bilateral LE edema Skin: warm, no rash Neuro:  no focal  deficits Psych: appropriate affect   Diagnostic Studies  05/2021 echo IMPRESSIONS     1. Left ventricular ejection fraction, by estimation, is 60 to 65%. The  left ventricle has normal function. The left ventricle has no regional  wall motion abnormalities.  Left ventricular diastolic parameters are  consistent with Grade I diastolic  dysfunction (impaired relaxation).   2. Right ventricular systolic function is normal. The right ventricular  size is normal. Tricuspid regurgitation signal is inadequate for assessing  PA pressure.   3. The mitral valve is normal in structure. No evidence of mitral valve  regurgitation. No evidence of mitral stenosis.   4. The aortic valve is tricuspid. Aortic valve regurgitation is not  visualized. Mild aortic valve sclerosis is present, with no evidence of  aortic valve stenosis.   5. The inferior vena cava is normal in size with greater than 50%  respiratory variability, suggesting right atrial pressure of 3 mmHg.    05/2021 cath  Ramus lesion is 95% stenosed.   Balloon angioplasty was performed using a BALLOON SAPPHIRE 2.0X12.   Post intervention, there is a 10% residual stenosis.   1.  Single-vessel coronary artery disease with severe thrombotic stenosis at the bifurcation of the intermediate Katelyn Kelly, treated successfully with balloon angioplasty using a 2 mm balloon.  Intermediate subbranch occlusion noted following angioplasty, likely from distal embolization 2.  Patent left main, LAD, AV circumflex, and RCA without significant stenosis 3.  Normal LV function evaluated by echo with normal LVEDP   Recommend: Aggressive medical therapy, tobacco cessation, dual antiplatelet therapy with aspirin  and ticagrelor  as tolerated for 12 months for treatment of ACS/non-STEMI   04/2020 carotid US  IMPRESSION: 1. Less than 50% stenosis of the right ICA. 2. Normal appearance of the left ICA. 3. Antegrade flow is noted within both vertebral arteries. 4. There are few mildly enlarged left cervical lymph nodes. These are favored to be reactive in etiology. Correlation with physical exam is recommended.   12/2022 heart monitor 7 day monitor   Rare supraventricular ectopy in the form of isolated PACs, couplets. Two runs  of SVT longest 10 beats   Rare ventricular ectopy in the form of isolated PVCs, couplets   Reported symptoms correlated with sinus rhythm, PACs,     Patch Wear Time:  7 days and 9 hours (2024-01-24T11:16:10-498 to 2024-01-31T20:35:37-0500)   Patient had a min HR of 76 bpm, max HR of 182 bpm, and avg HR of 92 bpm. Predominant underlying rhythm was Sinus Rhythm. 2 Supraventricular Tachycardia runs occurred, the run with the fastest interval lasting 10 beats with a max rate of 182 bpm (avg 162  bpm); the run with the fastest interval was also the longest. Isolated SVEs were rare (<1.0%), SVE Couplets were rare (<1.0%), and no SVE Triplets were present. Isolated VEs were rare (<1.0%), VE Couplets were rare (<1.0%), and no VE Triplets were  present.    Assessment and Plan   1.CAD - prior chest pains have resolved, she had not scheduled the stress test from our last visit. Can hold off on testing at this time - continue current meds   2. Hyperlipidemia - repeat lipid panel   3. HTN - at goal, continue current meds   4. Palpitations - recent monitor overall mild ectopy, short rare runs of SVT - slight increase in symptoms in setting of bronchitis, recent steroid course.  - continue lopressor , discussed can take additional 12.5mg  prn  5. LE edema - mild edema today, she reports  recent steroid course - start lasix  20mg  prn swelling   F/u 6 month     Dorn PHEBE Ross, M.D.

## 2023-12-18 ENCOUNTER — Encounter: Payer: Self-pay | Admitting: Gastroenterology

## 2023-12-18 ENCOUNTER — Ambulatory Visit (HOSPITAL_COMMUNITY)
Admission: RE | Admit: 2023-12-18 | Discharge: 2023-12-18 | Disposition: A | Discharge status: Home / Self Care | Payer: 59 | Source: Ambulatory Visit | Attending: Gastroenterology | Admitting: Gastroenterology

## 2023-12-18 ENCOUNTER — Ambulatory Visit (INDEPENDENT_AMBULATORY_CARE_PROVIDER_SITE_OTHER): Payer: 59 | Admitting: Gastroenterology

## 2023-12-18 VITALS — BP 137/83 | HR 142 | Temp 98.5°F | Ht 61.5 in | Wt 189.6 lb

## 2023-12-18 DIAGNOSIS — R140 Abdominal distension (gaseous): Secondary | ICD-10-CM | POA: Insufficient documentation

## 2023-12-18 DIAGNOSIS — K5900 Constipation, unspecified: Secondary | ICD-10-CM | POA: Diagnosis present

## 2023-12-18 DIAGNOSIS — Z800 Family history of malignant neoplasm of digestive organs: Secondary | ICD-10-CM

## 2023-12-18 DIAGNOSIS — R7401 Elevation of levels of liver transaminase levels: Secondary | ICD-10-CM | POA: Diagnosis not present

## 2023-12-18 DIAGNOSIS — K219 Gastro-esophageal reflux disease without esophagitis: Secondary | ICD-10-CM

## 2023-12-18 MED ORDER — LINACLOTIDE 290 MCG PO CAPS: 290.0000 ug | ORAL_CAPSULE | Freq: Every day | ORAL | 3 refills | Status: DC

## 2023-12-18 NOTE — Progress Notes (Signed)
GI Office Note    Referring Provider: Gwenlyn Found, MD Primary Care Physician:  Gwenlyn Found, MD  Primary Gastroenterologist: Hennie Duos. Marletta Lor, DO   Chief Complaint   Chief Complaint  Patient presents with   Constipation    History of Present Illness   Katelyn Kelly is a 63 y.o. female presenting today for same-day prior visit for constipation.  Last seen November 2024.  Previously failed Trulance, having a bowel movement once or twice per week. Lubiprostone 24 mcg twice daily started at last office visit.  Add MiraLAX as needed.  Advised to increase dietary fiber and water.  Overdue for colonoscopy due to family history of colon cancer in 2 first-degree relatives.  Patient states that he is a did not work very well.  In the beginning she would have a stool about every 2 to 3 days which was an improvement but she did not feel like it was very effective.  Symptoms seem to worsen when she was switched from Ozempic to [facility] on January 17.  She took it only for 2 weeks and then stopped because she said she thought she was going to die from it.  She was switched because her diabetes was not adequately controlled on Ozempic.  She has been trying to get her diabetes under control so that she can undergo back surgery.  Over the past couple of weeks she has had worsening constipation.  She feels like she cannot get anything to move.  She feels like she has stool up to her forehead.  A few weeks ago she tried to digitally disimpact herself and she saw a lot of fresh blood.  This scared her.  Currently taking lubiprostone 24 mcg twice daily.  2 weeks ago added MiraLAX 1 capful in her coffee daily.  Taking 5 prunes daily but this is causing her sugar to go up more.  She is using fleet glycerin suppositories as needed.  Has used enemas without relief either.  Added stool softener 100 mg daily not helping. She stopped taking oxycodone because of constipation.  Having a lot of issues  with back pain.  States she has been afraid to eat.  Denies any vomiting.  No heartburn.  This morning she was able to pass two small pasty stools.    EGD 04/2018: -small hiatal hernia -mild gastritis   Colonoscopy 08/2017: -five 3-4 mm polyps in rectum and sigmoid colon, hyperplastic -significant looping left colon -diverticulosis -internal hemorrhoids -external hemorrhoids -random colon biopsies normal -colonoscopy five years due to FH    Medications   Current Outpatient Medications  Medication Sig Dispense Refill   ACCU-CHEK GUIDE test strip      ALPRAZolam (XANAX) 1 MG tablet Take by mouth.     aspirin EC 81 MG tablet Take 1 tablet (81 mg total) by mouth daily. Swallow whole.     atorvastatin (LIPITOR) 80 MG tablet Take 1 tablet (80 mg total) by mouth at bedtime. (Patient taking differently: Take 40 mg by mouth at bedtime.) 90 tablet 1   busPIRone (BUSPAR) 7.5 MG tablet Take by mouth See admin instructions. Take 0.5 tablets (3.75 mg total) by mouth 3 times daily for 7 days, THEN 1 tablet (7.5 mg total) 3 times daily for 7 days, THEN 2 tablets (15 mg total) 3 times daily     calcium carbonate (TUMS - DOSED IN MG ELEMENTAL CALCIUM) 500 MG chewable tablet Chew 1,000 mg by mouth daily as needed for indigestion  or heartburn.     cyclobenzaprine (FLEXERIL) 5 MG tablet Take 1 tablet (5 mg total) by mouth at bedtime. (Patient taking differently: Take 5 mg by mouth 3 (three) times daily as needed.) 7 tablet 0   Fremanezumab-vfrm (AJOVY) 225 MG/1.5ML SOAJ Inject 225 mg into the skin every 30 (thirty) days. 1.68 mL 11   furosemide (LASIX) 20 MG tablet Take 1 tablet (20 mg total) by mouth daily as needed for fluid (Swelling). 90 tablet 3   insulin lispro (HUMALOG) 100 UNIT/ML KwikPen Inject 2-4 Units into the skin See admin instructions. Inject 2 to 4 units 3 times daily. May inject a 4th dose as needed for high blood sugar     ipratropium (ATROVENT HFA) 17 MCG/ACT inhaler Inhale 2 puffs into  the lungs every 6 (six) hours as needed for wheezing. 1 each 0   isosorbide mononitrate (IMDUR) 60 MG 24 hr tablet Take 1 tablet (60 mg total) by mouth daily. 90 tablet 3   lubiprostone (AMITIZA) 24 MCG capsule Take 1 capsule (24 mcg total) by mouth 2 (two) times daily with a meal. 60 capsule 5   Menthol, Topical Analgesic, (BIOFREEZE) 10 % LIQD Apply 1 application topically daily as needed (pain).     methocarbamol (ROBAXIN) 750 MG tablet Take 1 tablet (750 mg total) by mouth 2 (two) times daily. 10 tablet 0   metoprolol tartrate (LOPRESSOR) 25 MG tablet TAKE (1/2) TABLET BY MOUTH TWICE DAILY 90 tablet 3   nitroGLYCERIN (NITROSTAT) 0.4 MG SL tablet PLACE ONE TABLET UNDER THE TOUNGE EVERY FIVE MINUTES FOR 3 DOSES AS NEEDED FOR CHEST PAINS. 25 tablet 3   NURTEC 75 MG TBDP TAKE ONE TABLET DAILY AS NEEDED. 8 tablet 6   ondansetron (ZOFRAN-ODT) 4 MG disintegrating tablet Take 1 tablet (4 mg total) by mouth every 8 (eight) hours as needed for nausea or vomiting. 30 tablet 2   oxyCODONE (OXY IR/ROXICODONE) 5 MG immediate release tablet Take 5 mg by mouth every 6 (six) hours as needed.     pantoprazole (PROTONIX) 40 MG tablet Take 1 tablet (40 mg total) by mouth daily before breakfast. 90 tablet 3   pramipexole (MIRAPEX) 0.25 MG tablet Take 0.25 mg by mouth at bedtime.     SOLIQUA 100-33 UNT-MCG/ML SOPN SMARTSIG:30 Unit(s) SUB-Q Every Morning     SURE COMFORT PEN NEEDLES 32G X 4 MM MISC      No current facility-administered medications for this visit.    Allergies   Allergies as of 12/18/2023 - Review Complete 12/18/2023  Allergen Reaction Noted   Meprobamate Hives and Swelling 10/28/2020   Sinequan [doxepin hcl] Anaphylaxis 01/10/2012   Tranxene [clorazepate dipotassium] Hives and Swelling 01/10/2012   Albuterol Other (See Comments) 11/08/2017   Haloperidol Other (See Comments) 11/08/2017   Sertraline Other (See Comments) 11/08/2017   Vortioxetine Other (See Comments) 11/08/2017   Elavil  [amitriptyline] Palpitations 05/20/2022       Review of Systems   General: Negative for anorexia, weight loss, fever, chills, fatigue, weakness. ENT: Negative for hoarseness, difficulty swallowing , nasal congestion. CV: Negative for chest pain, angina, palpitations, dyspnea on exertion, peripheral edema.  Respiratory: Negative for dyspnea at rest, dyspnea on exertion, cough, sputum, wheezing.  GI: See history of present illness. GU:  Negative for dysuria, hematuria, urinary incontinence, urinary frequency, nocturnal urination.  Endo: Negative for unusual weight change.     Physical Exam   BP 137/83 (BP Location: Right Arm, Patient Position: Sitting, Cuff Size: Normal)   Pulse Marland Kitchen)  142   Temp 98.5 F (36.9 C) (Oral)   Ht 5' 1.5" (1.562 m)   Wt 189 lb 9.6 oz (86 kg)   SpO2 95%   BMI 35.24 kg/m    General: Well-nourished, well-developed in no acute distress.  Eyes: No icterus. Mouth: Oropharyngeal mucosa moist and pink , no lesions erythema or exudate. Abdomen: Bowel sounds are normal,  no hepatosplenomegaly or masses,  no abdominal bruits or hernia , no rebound or guarding. Mild upper abd tenderness. Moderate distention Rectal: patient declined Extremities: No lower extremity edema. No clubbing or deformities. Neuro: Alert and oriented x 4   Skin: Warm and dry, no jaundice.   Psych: Alert and cooperative, normal mood and affect.  Labs   Lab Results  Component Value Date   NA 138 08/16/2023   CL 105 08/16/2023   K 4.2 08/16/2023   CO2 23 08/16/2023   BUN 18 08/16/2023   CREATININE 1.08 (H) 08/16/2023   GFRNONAA 58 (L) 08/16/2023   CALCIUM 8.5 (L) 08/16/2023   ALBUMIN 3.8 08/16/2023   GLUCOSE 157 (H) 08/16/2023   Lab Results  Component Value Date   WBC 13.5 (H) 08/16/2023   HGB 16.0 (H) 08/16/2023   HCT 48.5 (H) 08/16/2023   MCV 94.4 08/16/2023   PLT 248 08/16/2023     Lab Results  Component Value Date   ALT 46 (H) 08/16/2023   AST 26 08/16/2023   ALKPHOS  79 08/16/2023   BILITOT 0.4 08/16/2023    Imaging Studies   No results found.  Assessment/Plan:   GERD: -doing better -continue pantoprazole  Constipation: -acute worsening of chronic symptoms with starting Mounjaro last month. Has been off GLP1s for two weeks at this time.  -failed Trulance, Amitiza BID -abdominal xray to assess stool load -continue miralax 17g daily, started two weeks ago -mini colon purge planned today after abdominal xray results return -Linzess daily. -return ov in two weeks  FH colon cancer: -overdue for high risk screening colonoscopy with FH of colon cancer in two first degree relatives -She is concerned about being receiving too much anesthesia, feels like it happened with her hernia repair in 2022.   -discuss at next ov, once constipation adequately managed  Elevated ALT: -MRI liver unremarkable -recommended limited serological work up at last ov, she did not complete labs. -will discuss at next [place]. Melvyn Neth, MHS, PA-C [facility]

## 2023-12-18 NOTE — Patient Instructions (Signed)
Please go to [facility] and have abdominal xray now. We will contact you with results this afternoon and further instructions.  We will be stopping lubiprostone (Amitiza) and starting new medication after xray results return. You will also need to pick up bisacodyl 5mg  tablets from the store for a mini colon purge. If no obstruction noted on xray we will have you do the follow mini colon purge today. Bisacodyl 10mg  by mouth along with first dose of miralax (one capful). Then every hour for five hours, take another capful of miralax. So you will take total of six capfuls today. Drink at least 32 ounces of additional water during this prep.  4.  Return office visit in two weeks to see how you are doing and address other GI concerns. We still need to get a colonoscopy done at some point.

## 2023-12-20 LAB — HEMOGLOBIN A1C: Hemoglobin A1C: 7.7

## 2023-12-26 ENCOUNTER — Ambulatory Visit: Payer: 59 | Admitting: Cardiology

## 2024-01-01 NOTE — Progress Notes (Unsigned)
 GI Office Note    Referring Provider: Gwenlyn Found, MD Primary Care Physician:  Gwenlyn Found, MD  Primary Gastroenterologist:  Chief Complaint   No chief complaint on file.   History of Present Illness   Katelyn Kelly is a 63 y.o. female presenting today for follow up. Last seen two weeks ago. H/O GERD, constipation, elevated ALT, FH of CRC.   Abd xray with small amt of stool throughout the colon. She was given mini colon purge, advised to stop lubiprostone and start linzess, continue miralax.    EGD 04/2018: -small hiatal hernia -mild gastritis   Colonoscopy 08/2017: -five 3-4 mm polyps in rectum and sigmoid colon, hyperplastic -significant looping left colon -diverticulosis -internal hemorrhoids -external hemorrhoids -random colon biopsies normal -colonoscopy five years due to FH   Medications   Current Outpatient Medications  Medication Sig Dispense Refill   ACCU-CHEK GUIDE test strip      ALPRAZolam (XANAX) 1 MG tablet Take by mouth.     aspirin EC 81 MG tablet Take 1 tablet (81 mg total) by mouth daily. Swallow whole.     atorvastatin (LIPITOR) 80 MG tablet Take 1 tablet (80 mg total) by mouth at bedtime. (Patient taking differently: Take 40 mg by mouth at bedtime.) 90 tablet 1   busPIRone (BUSPAR) 7.5 MG tablet Take by mouth See admin instructions. Take 0.5 tablets (3.75 mg total) by mouth 3 times daily for 7 days, THEN 1 tablet (7.5 mg total) 3 times daily for 7 days, THEN 2 tablets (15 mg total) 3 times daily     calcium carbonate (TUMS - DOSED IN MG ELEMENTAL CALCIUM) 500 MG chewable tablet Chew 1,000 mg by mouth daily as needed for indigestion or heartburn.     cyclobenzaprine (FLEXERIL) 5 MG tablet Take 1 tablet (5 mg total) by mouth at bedtime. (Patient taking differently: Take 5 mg by mouth 3 (three) times daily as needed.) 7 tablet 0   Fremanezumab-vfrm (AJOVY) 225 MG/1.5ML SOAJ Inject 225 mg into the skin every 30 (thirty) days. 1.68 mL  11   furosemide (LASIX) 20 MG tablet Take 1 tablet (20 mg total) by mouth daily as needed for fluid (Swelling). 90 tablet 3   insulin lispro (HUMALOG) 100 UNIT/ML KwikPen Inject 2-4 Units into the skin See admin instructions. Inject 2 to 4 units 3 times daily. May inject a 4th dose as needed for high blood sugar     ipratropium (ATROVENT HFA) 17 MCG/ACT inhaler Inhale 2 puffs into the lungs every 6 (six) hours as needed for wheezing. 1 each 0   isosorbide mononitrate (IMDUR) 60 MG 24 hr tablet Take 1 tablet (60 mg total) by mouth daily. 90 tablet 3   linaclotide (LINZESS) 290 MCG CAPS capsule Take 1 capsule (290 mcg total) by mouth daily before breakfast. 30 capsule 3   Menthol, Topical Analgesic, (BIOFREEZE) 10 % LIQD Apply 1 application topically daily as needed (pain).     methocarbamol (ROBAXIN) 750 MG tablet Take 1 tablet (750 mg total) by mouth 2 (two) times daily. 10 tablet 0   metoprolol tartrate (LOPRESSOR) 25 MG tablet TAKE (1/2) TABLET BY MOUTH TWICE DAILY 90 tablet 3   nitroGLYCERIN (NITROSTAT) 0.4 MG SL tablet PLACE ONE TABLET UNDER THE TOUNGE EVERY FIVE MINUTES FOR 3 DOSES AS NEEDED FOR CHEST PAINS. 25 tablet 3   NURTEC 75 MG TBDP TAKE ONE TABLET DAILY AS NEEDED. 8 tablet 6   ondansetron (ZOFRAN-ODT) 4 MG disintegrating tablet Take  1 tablet (4 mg total) by mouth every 8 (eight) hours as needed for nausea or vomiting. 30 tablet 2   oxyCODONE (OXY IR/ROXICODONE) 5 MG immediate release tablet Take 5 mg by mouth every 6 (six) hours as needed.     pantoprazole (PROTONIX) 40 MG tablet Take 1 tablet (40 mg total) by mouth daily before breakfast. 90 tablet 3   pramipexole (MIRAPEX) 0.25 MG tablet Take 0.25 mg by mouth at bedtime.     SOLIQUA 100-33 UNT-MCG/ML SOPN SMARTSIG:30 Unit(s) SUB-Q Every Morning     SURE COMFORT PEN NEEDLES 32G X 4 MM MISC      No current facility-administered medications for this visit.    Allergies   Allergies as of 01/02/2024 - Review Complete 12/18/2023   Allergen Reaction Noted   Meprobamate Hives and Swelling 10/28/2020   Sinequan [doxepin hcl] Anaphylaxis 01/10/2012   Tranxene [clorazepate dipotassium] Hives and Swelling 01/10/2012   Albuterol Other (See Comments) 11/08/2017   Haloperidol Other (See Comments) 11/08/2017   Sertraline Other (See Comments) 11/08/2017   Vortioxetine Other (See Comments) 11/08/2017   Elavil [amitriptyline] Palpitations 05/20/2022     Past Medical History   Past Medical History:  Diagnosis Date   Anxiety and depression    Bilateral carpal tunnel syndrome    Chronic back pain    Chronic neck pain    Depression    Diabetes mellitus    GERD (gastroesophageal reflux disease)    H/O syncope    Headache    History of panic attacks    Hypertension    IBS (irritable bowel syndrome)    Manic depression (HCC)    Migraine    Panic attack     Past Surgical History   Past Surgical History:  Procedure Laterality Date   ABDOMINAL HYSTERECTOMY     ABDOMINAL SURGERY     APPENDECTOMY     BIOPSY  09/05/2017   Procedure: BIOPSY;  Surgeon: West Bali, MD;  Location: [facility];  Service: Endoscopy;;  random colon   BIOPSY  05/01/2018   Procedure: BIOPSY;  Surgeon: West Bali, MD;  Location: [facility];  Service: Endoscopy;;  gastric biopsy   CARPAL TUNNEL RELEASE Bilateral    CHOLECYSTECTOMY     COLONOSCOPY WITH PROPOFOL N/A 09/05/2017   Procedure: COLONOSCOPY WITH PROPOFOL;  Surgeon: West Bali, MD; normal TI, 5 small polyps, diverticulosis in the cecum, internal and external hemorrhoids.  Random colon biopsies benign.  Colon polyps were hyperplastic.  Repeat in 2023.   CORONARY BALLOON ANGIOPLASTY N/A 06/28/2021   Procedure: CORONARY BALLOON ANGIOPLASTY;  Surgeon: Tonny Bollman, MD;  Location: [facility] INVASIVE CV LAB;  Service: Cardiovascular;  Laterality: N/A;   DILATION AND CURETTAGE OF UTERUS     ESOPHAGOGASTRODUODENOSCOPY (EGD) WITH PROPOFOL N/A 05/01/2018   Procedure:  ESOPHAGOGASTRODUODENOSCOPY (EGD) WITH PROPOFOL;  Surgeon: West Bali, MD;  normal esophagus, small hiatal hernia, mild gastritis s/p biopsy, normal duodenum.  No H. pylori.    HERNIA REPAIR     INCISIONAL HERNIA REPAIR N/A 11/06/2020   Procedure: HERNIA REPAIR INCISIONAL, PRIMARY REPAIR;  Surgeon: Lucretia Roers, MD;  Location: [facility];  Service: General;  Laterality: N/A;   LEFT HEART CATH AND CORONARY ANGIOGRAPHY N/A 06/28/2021   Procedure: LEFT HEART CATH AND CORONARY ANGIOGRAPHY;  Surgeon: Tonny Bollman, MD;  Location: [facility] INVASIVE CV LAB;  Service: Cardiovascular;  Laterality: N/A;   POLYPECTOMY  09/05/2017   Procedure: POLYPECTOMY;  Surgeon: West Bali, MD;  Location:  [facility];  Service: Endoscopy;;  sigmoid and rectal   TUBAL LIGATION      Past Family History   Family History  Problem Relation Age of Onset   Diabetes Mother    Stroke Mother    Depression Mother    Lung cancer Mother    Colon cancer Father 34       Passed away from colon cancer   Colon cancer Brother 26       Passed away from colon cancer   Bipolar disorder Son     Past Social History   Social History   Socioeconomic History   Marital status: Legally Separated    Spouse name: Not on file   Number of children: Not on file   Years of education: Not on file   Highest education level: Not on file  Occupational History   Not on file  Tobacco Use   Smoking status: Every Day    Current packs/day: 1.00    Average packs/day: 1 pack/day for 40.0 years (40.0 ttl pk-yrs)    Types: Cigarettes   Smokeless tobacco: Never   Tobacco comments:    one pack a day. Pt has purchased nicotine patches. She has a plan to stop and is going to set a quit date.   Vaping Use   Vaping status: Never Used  Substance and Sexual Activity   Alcohol use: No   Drug use: No   Sexual activity: Not Currently    Birth control/protection: Surgical  Other Topics Concern   Not on file  Social History Narrative   Not  on file   Social Drivers of Health   Financial Resource Strain: Not on file  Food Insecurity: Low Risk  (04/06/2023)   Received from [facility]   Hunger Vital Sign    Worried About Running Out of Food in the Last Year: Never true    Ran Out of Food in the Last Year: Never true  Transportation Needs: Not on file (04/06/2023)  Physical Activity: Not on file  Stress: Not on file  Social Connections: Not on file  Intimate Partner Violence: Not on file    Review of Systems   General: Negative for anorexia, weight loss, fever, chills, fatigue, weakness. ENT: Negative for hoarseness, difficulty swallowing , nasal congestion. CV: Negative for chest pain, angina, palpitations, dyspnea on exertion, peripheral edema.  Respiratory: Negative for dyspnea at rest, dyspnea on exertion, cough, sputum, wheezing.  GI: See history of present illness. GU:  Negative for dysuria, hematuria, urinary incontinence, urinary frequency, nocturnal urination.  Endo: Negative for unusual weight change.     Physical Exam   There were no vitals taken for this visit.   General: Well-nourished, well-developed in no acute distress.  Eyes: No icterus. Mouth: Oropharyngeal mucosa moist and pink , no lesions erythema or exudate. Lungs: Clear to auscultation bilaterally.  Heart: Regular rate and rhythm, no murmurs rubs or gallops.  Abdomen: Bowel sounds are normal, nontender, nondistended, no hepatosplenomegaly or masses,  no abdominal bruits or hernia , no rebound or guarding.  Rectal: ***  Extremities: No lower extremity edema. No clubbing or deformities. Neuro: Alert and oriented x 4   Skin: Warm and dry, no jaundice.   Psych: Alert and cooperative, normal mood and affect.  Labs   *** Imaging Studies   DG Abd 1 View Result Date: 12/18/2023 CLINICAL DATA:  Constipation EXAM: ABDOMEN - 1 VIEW COMPARISON:  CT abdomen and pelvis 02/22/2023 FINDINGS: Lung bases are clear.  Postsurgical changes right upper  quadrant. Small amount of stool throughout the colon. Paucity of small bowel gas. No free air. Lumbar spine degenerative changes. IMPRESSION: Small amount of stool throughout the colon. Electronically Signed   By: Annia Belt M.D.   On: 12/18/2023 12:57    Assessment       PLAN   ***   Leanna Battles. Melvyn Neth, MHS, PA-C [facility]

## 2024-01-02 ENCOUNTER — Ambulatory Visit (INDEPENDENT_AMBULATORY_CARE_PROVIDER_SITE_OTHER): Payer: 59 | Admitting: Gastroenterology

## 2024-01-02 ENCOUNTER — Encounter: Payer: Self-pay | Admitting: Gastroenterology

## 2024-01-02 ENCOUNTER — Other Ambulatory Visit: Payer: Self-pay | Admitting: *Deleted

## 2024-01-02 VITALS — BP 95/68 | HR 60 | Temp 98.2°F | Ht <= 58 in | Wt 189.8 lb

## 2024-01-02 DIAGNOSIS — K5900 Constipation, unspecified: Secondary | ICD-10-CM

## 2024-01-02 DIAGNOSIS — R7401 Elevation of levels of liver transaminase levels: Secondary | ICD-10-CM | POA: Diagnosis not present

## 2024-01-02 DIAGNOSIS — R7989 Other specified abnormal findings of blood chemistry: Secondary | ICD-10-CM | POA: Insufficient documentation

## 2024-01-02 DIAGNOSIS — Z800 Family history of malignant neoplasm of digestive organs: Secondary | ICD-10-CM

## 2024-01-02 DIAGNOSIS — Z794 Long term (current) use of insulin: Secondary | ICD-10-CM

## 2024-01-02 DIAGNOSIS — R140 Abdominal distension (gaseous): Secondary | ICD-10-CM

## 2024-01-02 NOTE — Patient Instructions (Addendum)
 Continue Linzess daily, try taking with food to get better results. Add miralax one capful daily.  Add simethicone (Gas-X), this is over the counter. Use as package instructs.  Return to the office in 2 weeks to follow up on constipation. Complete labs at your convenience.  Referral to diabetes specialist as discussed.

## 2024-01-16 ENCOUNTER — Ambulatory Visit: Admitting: Gastroenterology

## 2024-01-16 ENCOUNTER — Encounter: Payer: Self-pay | Admitting: Gastroenterology

## 2024-01-16 NOTE — Progress Notes (Deleted)
 GI Office Note    Referring Provider: Gwenlyn Found, MD Primary Care Physician:  Gwenlyn Found, MD  Primary Gastroenterologist: Hennie Duos. Marletta Lor, DO   Chief Complaint   No chief complaint on file.   History of Present Illness   Katelyn Kelly is a 63 y.o. female presenting today for follow up. H/o GERD, constipation, elevated ALT, FH of CRC. Last seen 01/02/24.   Previously failed Trulance, lubiprostone. Doing better on Linzess daily, miralax every other day. Recommended limited labs to evaluate elevated ALT back in 09/2023 but not completed. She agreed to labs after last ov, but again not complteted. Wanted to hold off on colonoscopy until sugars better controlled.     MRI Abd with and without contrast 08/2023: IMPRESSION: 1. No MRI findings of the abdomen to explain abdominal pain or bloating. 2. Status post cholecystectomy. 3. Definitively benign, macroscopic fat containing bilateral adrenal adenomata and left renal angiomyolipomas, requiring no further follow-up or characterization.  EGD 04/2018: -small hiatal hernia -mild gastritis   Colonoscopy 08/2017: -five 3-4 mm polyps in rectum and sigmoid colon, hyperplastic -significant looping left colon -diverticulosis -internal hemorrhoids -external hemorrhoids -random colon biopsies normal -colonoscopy five years due to FH     Medications   Current Outpatient Medications  Medication Sig Dispense Refill   ACCU-CHEK GUIDE test strip      ALPRAZolam (XANAX) 1 MG tablet Take by mouth.     aspirin EC 81 MG tablet Take 1 tablet (81 mg total) by mouth daily. Swallow whole.     atorvastatin (LIPITOR) 80 MG tablet Take 1 tablet (80 mg total) by mouth at bedtime. (Patient taking differently: Take 40 mg by mouth at bedtime.) 90 tablet 1   busPIRone (BUSPAR) 7.5 MG tablet Take by mouth See admin instructions. Take 0.5 tablets (3.75 mg total) by mouth 3 times daily for 7 days, THEN 1 tablet (7.5 mg  total) 3 times daily for 7 days, THEN 2 tablets (15 mg total) 3 times daily     calcium carbonate (TUMS - DOSED IN MG ELEMENTAL CALCIUM) 500 MG chewable tablet Chew 1,000 mg by mouth daily as needed for indigestion or heartburn.     cyclobenzaprine (FLEXERIL) 5 MG tablet Take 1 tablet (5 mg total) by mouth at bedtime. (Patient taking differently: Take 5 mg by mouth 3 (three) times daily as needed.) 7 tablet 0   Fremanezumab-vfrm (AJOVY) 225 MG/1.5ML SOAJ Inject 225 mg into the skin every 30 (thirty) days. 1.68 mL 11   furosemide (LASIX) 20 MG tablet Take 1 tablet (20 mg total) by mouth daily as needed for fluid (Swelling). 90 tablet 3   insulin lispro (HUMALOG) 100 UNIT/ML KwikPen Inject 2-4 Units into the skin See admin instructions. Inject 2 to 4 units 3 times daily. May inject a 4th dose as needed for high blood sugar     ipratropium (ATROVENT HFA) 17 MCG/ACT inhaler Inhale 2 puffs into the lungs every 6 (six) hours as needed for wheezing. 1 each 0   isosorbide mononitrate (IMDUR) 60 MG 24 hr tablet Take 1 tablet (60 mg total) by mouth daily. 90 tablet 3   linaclotide (LINZESS) 290 MCG CAPS capsule Take 1 capsule (290 mcg total) by mouth daily before breakfast. 30 capsule 3   Menthol, Topical Analgesic, (BIOFREEZE) 10 % LIQD Apply 1 application topically daily as needed (pain).     methocarbamol (ROBAXIN) 750 MG tablet Take 1 tablet (750 mg total) by mouth 2 (two) times  daily. 10 tablet 0   metoprolol tartrate (LOPRESSOR) 25 MG tablet TAKE (1/2) TABLET BY MOUTH TWICE DAILY 90 tablet 3   nitroGLYCERIN (NITROSTAT) 0.4 MG SL tablet PLACE ONE TABLET UNDER THE TOUNGE EVERY FIVE MINUTES FOR 3 DOSES AS NEEDED FOR CHEST PAINS. 25 tablet 3   NURTEC 75 MG TBDP TAKE ONE TABLET DAILY AS NEEDED. 8 tablet 6   ondansetron (ZOFRAN-ODT) 4 MG disintegrating tablet Take 1 tablet (4 mg total) by mouth every 8 (eight) hours as needed for nausea or vomiting. 30 tablet 2   pantoprazole (PROTONIX) 40 MG tablet Take 1  tablet (40 mg total) by mouth daily before breakfast. 90 tablet 3   pramipexole (MIRAPEX) 0.25 MG tablet Take 0.25 mg by mouth at bedtime.     SOLIQUA 100-33 UNT-MCG/ML SOPN SMARTSIG:30 Unit(s) SUB-Q Every Morning     SURE COMFORT PEN NEEDLES 32G X 4 MM MISC      No current facility-administered medications for this visit.    Allergies   Allergies as of 01/16/2024 - Review Complete 01/02/2024  Allergen Reaction Noted   Meprobamate Hives and Swelling 10/28/2020   Sinequan [doxepin hcl] Anaphylaxis 01/10/2012   Tranxene [clorazepate dipotassium] Hives and Swelling 01/10/2012   Albuterol Other (See Comments) 11/08/2017   Haloperidol Other (See Comments) 11/08/2017   Sertraline Other (See Comments) 11/08/2017   Vortioxetine Other (See Comments) 11/08/2017   Elavil [amitriptyline] Palpitations 05/20/2022     Past Medical History   Past Medical History:  Diagnosis Date   Anxiety and depression    Bilateral carpal tunnel syndrome    Chronic back pain    Chronic neck pain    Depression    Diabetes mellitus    GERD (gastroesophageal reflux disease)    H/O syncope    Headache    History of panic attacks    Hypertension    IBS (irritable bowel syndrome)    Manic depression (HCC)    Migraine    Panic attack     Past Surgical History   Past Surgical History:  Procedure Laterality Date   ABDOMINAL HYSTERECTOMY     ABDOMINAL SURGERY     APPENDECTOMY     BIOPSY  09/05/2017   Procedure: BIOPSY;  Surgeon: West Bali, MD;  Location: [facility];  Service: Endoscopy;;  random colon   BIOPSY  05/01/2018   Procedure: BIOPSY;  Surgeon: West Bali, MD;  Location: [facility];  Service: Endoscopy;;  gastric biopsy   CARPAL TUNNEL RELEASE Bilateral    CHOLECYSTECTOMY     COLONOSCOPY WITH PROPOFOL N/A 09/05/2017   Procedure: COLONOSCOPY WITH PROPOFOL;  Surgeon: West Bali, MD; normal TI, 5 small polyps, diverticulosis in the cecum, internal and external hemorrhoids.   Random colon biopsies benign.  Colon polyps were hyperplastic.  Repeat in 2023.   CORONARY BALLOON ANGIOPLASTY N/A 06/28/2021   Procedure: CORONARY BALLOON ANGIOPLASTY;  Surgeon: Tonny Bollman, MD;  Location: [facility] INVASIVE CV LAB;  Service: Cardiovascular;  Laterality: N/A;   DILATION AND CURETTAGE OF UTERUS     ESOPHAGOGASTRODUODENOSCOPY (EGD) WITH PROPOFOL N/A 05/01/2018   Procedure: ESOPHAGOGASTRODUODENOSCOPY (EGD) WITH PROPOFOL;  Surgeon: West Bali, MD;  normal esophagus, small hiatal hernia, mild gastritis s/p biopsy, normal duodenum.  No H. pylori.    HERNIA REPAIR     INCISIONAL HERNIA REPAIR N/A 11/06/2020   Procedure: HERNIA REPAIR INCISIONAL, PRIMARY REPAIR;  Surgeon: Lucretia Roers, MD;  Location: [facility];  Service: General;  Laterality: N/A;  LEFT HEART CATH AND CORONARY ANGIOGRAPHY N/A 06/28/2021   Procedure: LEFT HEART CATH AND CORONARY ANGIOGRAPHY;  Surgeon: Tonny Bollman, MD;  Location: [facility] INVASIVE CV LAB;  Service: Cardiovascular;  Laterality: N/A;   POLYPECTOMY  09/05/2017   Procedure: POLYPECTOMY;  Surgeon: West Bali, MD;  [facility]: [facility];  Service: Endoscopy;;  sigmoid and rectal   TUBAL LIGATION      Past Family History   Family History  Problem Relation Age of Onset   Diabetes Mother    Stroke Mother    Depression Mother    Lung cancer Mother    Colon cancer Father 24       Passed away from colon cancer   Colon cancer Brother 64       Passed away from colon cancer   Bipolar disorder Son     Past Social History   Social History   Socioeconomic History   Marital status: Legally Separated    Spouse name: Not on file   Number of children: Not on file   Years of education: Not on file   Highest education level: Not on file  Occupational History   Not on file  Tobacco Use   Smoking status: Every Day    Current packs/day: 1.00    Average packs/day: 1 pack/day for 40.0 years (40.0 ttl pk-yrs)    Types: Cigarettes   Smokeless tobacco:  Never   Tobacco comments:    one pack a day. Pt has purchased nicotine patches. She has a plan to stop and is going to set a quit date.   Vaping Use   Vaping status: Never Used  Substance and Sexual Activity   Alcohol use: No   Drug use: No   Sexual activity: Not Currently    Birth control/protection: Surgical  Other Topics Concern   Not on file  Social History Narrative   Not on file   Social Drivers of Health   Financial Resource Strain: Not on file  Food Insecurity: Low Risk  (01/11/2024)   Received from [facility]   Hunger Vital Sign    Worried About Running Out of Food in the Last Year: Never true    Ran Out of Food in the Last Year: Never true  Transportation Needs: No Transportation Needs (01/11/2024)   Received from [employer]    In the past 12 months, has lack of reliable transportation kept you from medical appointments, meetings, work or from getting things needed for daily living? : No  Physical Activity: Not on file  Stress: Not on file  Social Connections: Not on file  Intimate Partner Violence: Not on file    Review of Systems   General: Negative for anorexia, weight loss, fever, chills, fatigue, weakness. ENT: Negative for hoarseness, difficulty swallowing , nasal congestion. CV: Negative for chest pain, angina, palpitations, dyspnea on exertion, peripheral edema.  Respiratory: Negative for dyspnea at rest, dyspnea on exertion, cough, sputum, wheezing.  GI: See history of present illness. GU:  Negative for dysuria, hematuria, urinary incontinence, urinary frequency, nocturnal urination.  Endo: Negative for unusual weight change.     Physical Exam   There were no vitals taken for this visit.   General: Well-nourished, well-developed in no acute distress.  Eyes: No icterus. Mouth: Oropharyngeal mucosa moist and pink , no lesions erythema or exudate. Lungs: Clear to auscultation bilaterally.  Heart: Regular rate and rhythm, no  murmurs rubs or gallops.  Abdomen: Bowel sounds are normal,  nontender, nondistended, no hepatosplenomegaly or masses,  no abdominal bruits or hernia , no rebound or guarding.  Rectal: ***  Extremities: No lower extremity edema. No clubbing or deformities. Neuro: Alert and oriented x 4   Skin: Warm and dry, no jaundice.   Psych: Alert and cooperative, normal mood and affect.  Labs   *** Imaging Studies   DG Abd 1 View Result Date: 12/18/2023 CLINICAL DATA:  Constipation EXAM: ABDOMEN - 1 VIEW COMPARISON:  CT abdomen and pelvis 02/22/2023 FINDINGS: Lung bases are clear. Postsurgical changes right upper quadrant. Small amount of stool throughout the colon. Paucity of small bowel gas. No free air. Lumbar spine degenerative changes. IMPRESSION: Small amount of stool throughout the colon. Electronically Signed   By: Annia Belt M.D.   On: 12/18/2023 12:57    Assessment       PLAN   ***   Leanna Battles. Melvyn Neth, MHS, PA-C [facility]

## 2024-01-30 NOTE — Progress Notes (Unsigned)
 GI Office Note    Referring Provider: Gwenlyn Found, MD Primary Care Physician:  Gwenlyn Found, MD  Primary Gastroenterologist:  Chief Complaint   No chief complaint on file.   History of Present Illness   Katelyn Kelly is a 63 y.o. female presenting today   for follow up. H/o GERD, constipation, elevated ALT, FH of CRC. Last seen 01/02/24.    Previously failed Trulance, lubiprostone. Doing better on Linzess daily, miralax every other day. Recommended limited labs to evaluate elevated ALT back in 09/2023 but not completed. She agreed to labs after last ov, but again not complteted. Wanted to hold off on colonoscopy until sugars better controlled.        MRI Abd with and without contrast 08/2023: IMPRESSION: 1. No MRI findings of the abdomen to explain abdominal pain or bloating. 2. Status post cholecystectomy. 3. Definitively benign, macroscopic fat containing bilateral adrenal adenomata and left renal angiomyolipomas, requiring no further follow-up or characterization.  EGD 04/2018: -small hiatal hernia -mild gastritis   Colonoscopy 08/2017: -five 3-4 mm polyps in rectum and sigmoid colon, hyperplastic -significant looping left colon -diverticulosis -internal hemorrhoids -external hemorrhoids -random colon biopsies normal -colonoscopy five years due to FH            Medications   Current Outpatient Medications  Medication Sig Dispense Refill   ACCU-CHEK GUIDE test strip      ALPRAZolam (XANAX) 1 MG tablet Take by mouth.     aspirin EC 81 MG tablet Take 1 tablet (81 mg total) by mouth daily. Swallow whole.     atorvastatin (LIPITOR) 80 MG tablet Take 1 tablet (80 mg total) by mouth at bedtime. (Patient taking differently: Take 40 mg by mouth at bedtime.) 90 tablet 1   busPIRone (BUSPAR) 7.5 MG tablet Take by mouth See admin instructions. Take 0.5 tablets (3.75 mg total) by mouth 3 times daily for 7 days, THEN 1 tablet (7.5 mg total) 3  times daily for 7 days, THEN 2 tablets (15 mg total) 3 times daily     calcium carbonate (TUMS - DOSED IN MG ELEMENTAL CALCIUM) 500 MG chewable tablet Chew 1,000 mg by mouth daily as needed for indigestion or heartburn.     cyclobenzaprine (FLEXERIL) 5 MG tablet Take 1 tablet (5 mg total) by mouth at bedtime. (Patient taking differently: Take 5 mg by mouth 3 (three) times daily as needed.) 7 tablet 0   Fremanezumab-vfrm (AJOVY) 225 MG/1.5ML SOAJ Inject 225 mg into the skin every 30 (thirty) days. 1.68 mL 11   furosemide (LASIX) 20 MG tablet Take 1 tablet (20 mg total) by mouth daily as needed for fluid (Swelling). 90 tablet 3   insulin lispro (HUMALOG) 100 UNIT/ML KwikPen Inject 2-4 Units into the skin See admin instructions. Inject 2 to 4 units 3 times daily. May inject a 4th dose as needed for high blood sugar     ipratropium (ATROVENT HFA) 17 MCG/ACT inhaler Inhale 2 puffs into the lungs every 6 (six) hours as needed for wheezing. 1 each 0   isosorbide mononitrate (IMDUR) 60 MG 24 hr tablet Take 1 tablet (60 mg total) by mouth daily. 90 tablet 3   linaclotide (LINZESS) 290 MCG CAPS capsule Take 1 capsule (290 mcg total) by mouth daily before breakfast. 30 capsule 3   Menthol, Topical Analgesic, (BIOFREEZE) 10 % LIQD Apply 1 application topically daily as needed (pain).     methocarbamol (ROBAXIN) 750 MG tablet Take 1 tablet (  750 mg total) by mouth 2 (two) times daily. 10 tablet 0   metoprolol tartrate (LOPRESSOR) 25 MG tablet TAKE (1/2) TABLET BY MOUTH TWICE DAILY 90 tablet 3   nitroGLYCERIN (NITROSTAT) 0.4 MG SL tablet PLACE ONE TABLET UNDER THE TOUNGE EVERY FIVE MINUTES FOR 3 DOSES AS NEEDED FOR CHEST PAINS. 25 tablet 3   NURTEC 75 MG TBDP TAKE ONE TABLET DAILY AS NEEDED. 8 tablet 6   ondansetron (ZOFRAN-ODT) 4 MG disintegrating tablet Take 1 tablet (4 mg total) by mouth every 8 (eight) hours as needed for nausea or vomiting. 30 tablet 2   pantoprazole (PROTONIX) 40 MG tablet Take 1 tablet (40 mg  total) by mouth daily before breakfast. 90 tablet 3   pramipexole (MIRAPEX) 0.25 MG tablet Take 0.25 mg by mouth at bedtime.     SOLIQUA 100-33 UNT-MCG/ML SOPN SMARTSIG:30 Unit(s) SUB-Q Every Morning     SURE COMFORT PEN NEEDLES 32G X 4 MM MISC      No current facility-administered medications for this visit.    Allergies   Allergies as of 01/31/2024 - Review Complete 01/02/2024  Allergen Reaction Noted   Meprobamate Hives and Swelling 10/28/2020   Sinequan [doxepin hcl] Anaphylaxis 01/10/2012   Tranxene [clorazepate dipotassium] Hives and Swelling 01/10/2012   Albuterol Other (See Comments) 11/08/2017   Haloperidol Other (See Comments) 11/08/2017   Sertraline Other (See Comments) 11/08/2017   Vortioxetine Other (See Comments) 11/08/2017   Elavil [amitriptyline] Palpitations 05/20/2022     Past Medical History   Past Medical History:  Diagnosis Date   Anxiety and depression    Bilateral carpal tunnel syndrome    Chronic back pain    Chronic neck pain    Depression    Diabetes mellitus    GERD (gastroesophageal reflux disease)    H/O syncope    Headache    History of panic attacks    Hypertension    IBS (irritable bowel syndrome)    Manic depression (HCC)    Migraine    Panic attack     Past Surgical History   Past Surgical History:  Procedure Laterality Date   ABDOMINAL HYSTERECTOMY     ABDOMINAL SURGERY     APPENDECTOMY     BIOPSY  09/05/2017   Procedure: BIOPSY;  Surgeon: West Bali, MD;  Location: [facility];  Service: Endoscopy;;  random colon   BIOPSY  05/01/2018   Procedure: BIOPSY;  Surgeon: West Bali, MD;  Location: [facility];  Service: Endoscopy;;  gastric biopsy   CARPAL TUNNEL RELEASE Bilateral    CHOLECYSTECTOMY     COLONOSCOPY WITH PROPOFOL N/A 09/05/2017   Procedure: COLONOSCOPY WITH PROPOFOL;  Surgeon: West Bali, MD; normal TI, 5 small polyps, diverticulosis in the cecum, internal and external hemorrhoids.  Random colon  biopsies benign.  Colon polyps were hyperplastic.  Repeat in 2023.   CORONARY BALLOON ANGIOPLASTY N/A 06/28/2021   Procedure: CORONARY BALLOON ANGIOPLASTY;  Surgeon: Tonny Bollman, MD;  Location: [facility] INVASIVE CV LAB;  Service: Cardiovascular;  Laterality: N/A;   DILATION AND CURETTAGE OF UTERUS     ESOPHAGOGASTRODUODENOSCOPY (EGD) WITH PROPOFOL N/A 05/01/2018   Procedure: ESOPHAGOGASTRODUODENOSCOPY (EGD) WITH PROPOFOL;  Surgeon: West Bali, MD;  normal esophagus, small hiatal hernia, mild gastritis s/p biopsy, normal duodenum.  No H. pylori.    HERNIA REPAIR     INCISIONAL HERNIA REPAIR N/A 11/06/2020   Procedure: HERNIA REPAIR INCISIONAL, PRIMARY REPAIR;  Surgeon: Lucretia Roers, MD;  Location: [facility];  Service: General;  Laterality: N/A;   LEFT HEART CATH AND CORONARY ANGIOGRAPHY N/A 06/28/2021   Procedure: LEFT HEART CATH AND CORONARY ANGIOGRAPHY;  Surgeon: Tonny Bollman, MD;  Location: [facility] INVASIVE CV LAB;  Service: Cardiovascular;  Laterality: N/A;   POLYPECTOMY  09/05/2017   Procedure: POLYPECTOMY;  Surgeon: West Bali, MD;  [facility];  Service: Endoscopy;;  sigmoid and rectal   TUBAL LIGATION      Past Family History   Family History  Problem Relation Age of Onset   Diabetes Mother    Stroke Mother    Depression Mother    Lung cancer Mother    Colon cancer Father 4       Passed away from colon cancer   Colon cancer Brother 26       Passed away from colon cancer   Bipolar disorder Son     Past Social History   Social History   Socioeconomic History   Marital status: Legally Separated    Spouse name: Not on file   Number of children: Not on file   Years of education: Not on file   Highest education level: Not on file  Occupational History   Not on file  Tobacco Use   Smoking status: Every Day    Current packs/day: 1.00    Average packs/day: 1 pack/day for 40.0 years (40.0 ttl pk-yrs)    Types: Cigarettes   Smokeless tobacco: Never    Tobacco comments:    one pack a day. Pt has purchased nicotine patches. She has a plan to stop and is going to set a quit date.   Vaping Use   Vaping status: Never Used  Substance and Sexual Activity   Alcohol use: No   Drug use: No   Sexual activity: Not Currently    Birth control/protection: Surgical  Other Topics Concern   Not on file  Social History Narrative   Not on file   Social Drivers of Health   Financial Resource Strain: Not on file  Food Insecurity: Low Risk  (01/11/2024)   Received from [facility]   Hunger Vital Sign    Worried About Running Out of Food in the Last Year: Never true    Ran Out of Food in the Last Year: Never true  Transportation Needs: No Transportation Needs (01/11/2024)   Received from [employer]    In the past 12 months, has lack of reliable transportation kept you from medical appointments, meetings, work or from getting things needed for daily living? : No  Physical Activity: Not on file  Stress: Not on file  Social Connections: Not on file  Intimate Partner Violence: Not on file    Review of Systems   General: Negative for anorexia, weight loss, fever, chills, fatigue, weakness. ENT: Negative for hoarseness, difficulty swallowing , nasal congestion. CV: Negative for chest pain, angina, palpitations, dyspnea on exertion, peripheral edema.  Respiratory: Negative for dyspnea at rest, dyspnea on exertion, cough, sputum, wheezing.  GI: See history of present illness. GU:  Negative for dysuria, hematuria, urinary incontinence, urinary frequency, nocturnal urination.  Endo: Negative for unusual weight change.     Physical Exam   There were no vitals taken for this visit.   General: Well-nourished, well-developed in no acute distress.  Eyes: No icterus. Mouth: Oropharyngeal mucosa moist and pink , no lesions erythema or exudate. Lungs: Clear to auscultation bilaterally.  Heart: Regular rate and rhythm, no murmurs rubs  or  gallops.  Abdomen: Bowel sounds are normal, nontender, nondistended, no hepatosplenomegaly or masses,  no abdominal bruits or hernia , no rebound or guarding.  Rectal: ***  Extremities: No lower extremity edema. No clubbing or deformities. Neuro: Alert and oriented x 4   Skin: Warm and dry, no jaundice.   Psych: Alert and cooperative, normal mood and affect.  Labs   *** Imaging Studies   No results found.  Assessment       PLAN   ***   Leanna Battles. Melvyn Neth, MHS, PA-C [facility]

## 2024-01-30 NOTE — Progress Notes (Unsigned)
 [facility]  PATIENT: Katelyn Kelly DOB: [birth year]  REFERRING CLINICIAN: Gwenlyn Found, MD HISTORY FROM: patient  REASON FOR VISIT: Migraine headaches   HISTORICAL  CHIEF COMPLAINT:  No chief complaint on file.   HISTORY OF PRESENT ILLNESS:    Update 01/31/2024 JM: Patient returns for follow-up visit.  Remains on Ajovy monthly injection Nurtec for rescue     Update 07/27/2023 JM: Returns for follow-up visit.  Started on Ajovy for preventative and Nurtec for rescue at prior visit reporting daily tension type headaches and 1 migraines per week.  Currently, reports about 2 migraines per week and daily tension type headaches. Ajovy first provided great benefit but over the past 3-4 months, migraines have worsening. Nurtec without great benefit. Use of ibuprofen up to 2x per week, no longer uses acetaminophen.  Does have chronic neck pain, worse on left side. Does endorse significant increased stressors over the past several months, has lost her home and son is now incarcerated. Difficulty with transportation, had to borrow a friends car to come to appointment. Is scheduled to see optometrist soon to receive new glasses (son broke her prior glasses), she believes this has also contributed to headaches.  Consult visit 01/23/2023 Dr. Marjory Kelly: 63 year old female here for evaluation of migraine headaches.  Patient has had migraines since the 1980s with bilateral temporal, occipital and posterior neck pain, throbbing sensation, hearing sensitivity, sensitive to light and sound, nausea and vomiting.  Symptoms of blurred vision, sometimes seeing squiggly lines.  She has tried topiramate and amitriptyline which caused side effects and did not relieve symptoms.  Was having 2-3 migraines per week, now about once per week.  Also having daily headaches which she uses ibuprofen and Tylenol on daily basis.  Also with significant stress at home related to various factors  including her son medical and mental health issues, her financial strain, transportation issues and her own history of traumatic experiences.   REVIEW OF SYSTEMS: Full 14 system review of systems performed and negative with exception of: as per HPI.  ALLERGIES: Allergies  Allergen Reactions   Meprobamate Hives and Swelling   Sinequan [Doxepin Hcl] Anaphylaxis   Tranxene [Clorazepate Dipotassium] Hives and Swelling   Albuterol Other (See Comments)    Patient does not remember the reaction. This was a record provided via [facility]   Haloperidol Other (See Comments)    Patient does not remember the reaction. This was a record provided via [facility]   Sertraline Other (See Comments)    Patient does not remember the reaction. This was a record provided via [facility]   Vortioxetine Other (See Comments)    Patient does not remember the reaction. This was a record provided via [facility]   Elavil [Amitriptyline] Palpitations    HOME MEDICATIONS: Outpatient Medications Prior to Visit  Medication Sig Dispense Refill   ACCU-CHEK GUIDE test strip      ALPRAZolam (XANAX) 1 MG tablet Take by mouth.     aspirin EC 81 MG tablet Take 1 tablet (81 mg total) by mouth daily. Swallow whole.     atorvastatin (LIPITOR) 80 MG tablet Take 1 tablet (80 mg total) by mouth at bedtime. (Patient taking differently: Take 40 mg by mouth at bedtime.) 90 tablet 1   busPIRone (BUSPAR) 7.5 MG tablet Take by mouth See admin instructions. Take 0.5 tablets (3.75 mg total) by mouth 3 times daily for 7 days, THEN 1 tablet (7.5 mg total) 3 times daily for 7 days,  THEN 2 tablets (15 mg total) 3 times daily     calcium carbonate (TUMS - DOSED IN MG ELEMENTAL CALCIUM) 500 MG chewable tablet Chew 1,000 mg by mouth daily as needed for indigestion or heartburn.     cyclobenzaprine (FLEXERIL) 5 MG tablet Take 1 tablet (5 mg total) by mouth at bedtime. (Patient taking differently: Take 5 mg by mouth 3  (three) times daily as needed.) 7 tablet 0   Fremanezumab-vfrm (AJOVY) 225 MG/1.5ML SOAJ Inject 225 mg into the skin every 30 (thirty) days. 1.68 mL 11   furosemide (LASIX) 20 MG tablet Take 1 tablet (20 mg total) by mouth daily as needed for fluid (Swelling). 90 tablet 3   insulin lispro (HUMALOG) 100 UNIT/ML KwikPen Inject 2-4 Units into the skin See admin instructions. Inject 2 to 4 units 3 times daily. May inject a 4th dose as needed for high blood sugar     ipratropium (ATROVENT HFA) 17 MCG/ACT inhaler Inhale 2 puffs into the lungs every 6 (six) hours as needed for wheezing. 1 each 0   isosorbide mononitrate (IMDUR) 60 MG 24 hr tablet Take 1 tablet (60 mg total) by mouth daily. 90 tablet 3   linaclotide (LINZESS) 290 MCG CAPS capsule Take 1 capsule (290 mcg total) by mouth daily before breakfast. 30 capsule 3   Menthol, Topical Analgesic, (BIOFREEZE) 10 % LIQD Apply 1 application topically daily as needed (pain).     methocarbamol (ROBAXIN) 750 MG tablet Take 1 tablet (750 mg total) by mouth 2 (two) times daily. 10 tablet 0   metoprolol tartrate (LOPRESSOR) 25 MG tablet TAKE (1/2) TABLET BY MOUTH TWICE DAILY 90 tablet 3   nitroGLYCERIN (NITROSTAT) 0.4 MG SL tablet PLACE ONE TABLET UNDER THE TOUNGE EVERY FIVE MINUTES FOR 3 DOSES AS NEEDED FOR CHEST PAINS. 25 tablet 3   NURTEC 75 MG TBDP TAKE ONE TABLET DAILY AS NEEDED. 8 tablet 6   ondansetron (ZOFRAN-ODT) 4 MG disintegrating tablet Take 1 tablet (4 mg total) by mouth every 8 (eight) hours as needed for nausea or vomiting. 30 tablet 2   pantoprazole (PROTONIX) 40 MG tablet Take 1 tablet (40 mg total) by mouth daily before breakfast. 90 tablet 3   pramipexole (MIRAPEX) 0.25 MG tablet Take 0.25 mg by mouth at bedtime.     SOLIQUA 100-33 UNT-MCG/ML SOPN SMARTSIG:30 Unit(s) SUB-Q Every Morning     SURE COMFORT PEN NEEDLES 32G X 4 MM MISC      No facility-administered medications prior to visit.    PAST MEDICAL HISTORY: Past Medical History:   Diagnosis Date   Anxiety and depression    Bilateral carpal tunnel syndrome    Chronic back pain    Chronic neck pain    Depression    Diabetes mellitus    GERD (gastroesophageal reflux disease)    H/O syncope    Headache    History of panic attacks    Hypertension    IBS (irritable bowel syndrome)    Manic depression (HCC)    Migraine    Panic attack     PAST SURGICAL HISTORY: Past Surgical History:  Procedure Laterality Date   ABDOMINAL HYSTERECTOMY     ABDOMINAL SURGERY     APPENDECTOMY     BIOPSY  09/05/2017   Procedure: BIOPSY;  Surgeon: West Bali, MD;  Location: [facility];  Service: Endoscopy;;  random colon   BIOPSY  05/01/2018   Procedure: BIOPSY;  Surgeon: West Bali, MD;  Location: [facility];  Service: Endoscopy;;  gastric biopsy   CARPAL TUNNEL RELEASE Bilateral    CHOLECYSTECTOMY     COLONOSCOPY WITH PROPOFOL N/A 09/05/2017   Procedure: COLONOSCOPY WITH PROPOFOL;  Surgeon: West Bali, MD; normal TI, 5 small polyps, diverticulosis in the cecum, internal and external hemorrhoids.  Random colon biopsies benign.  Colon polyps were hyperplastic.  Repeat in 2023.   CORONARY BALLOON ANGIOPLASTY N/A 06/28/2021   Procedure: CORONARY BALLOON ANGIOPLASTY;  Surgeon: Tonny Bollman, MD;  Location: [facility] INVASIVE CV LAB;  Service: Cardiovascular;  Laterality: N/A;   DILATION AND CURETTAGE OF UTERUS     ESOPHAGOGASTRODUODENOSCOPY (EGD) WITH PROPOFOL N/A 05/01/2018   Procedure: ESOPHAGOGASTRODUODENOSCOPY (EGD) WITH PROPOFOL;  Surgeon: West Bali, MD;  normal esophagus, small hiatal hernia, mild gastritis s/p biopsy, normal duodenum.  No H. pylori.    HERNIA REPAIR     INCISIONAL HERNIA REPAIR N/A 11/06/2020   Procedure: HERNIA REPAIR INCISIONAL, PRIMARY REPAIR;  Surgeon: Lucretia Roers, MD;  Location: [facility];  Service: General;  Laterality: N/A;   LEFT HEART CATH AND CORONARY ANGIOGRAPHY N/A 06/28/2021   Procedure: LEFT HEART CATH AND CORONARY ANGIOGRAPHY;   Surgeon: Tonny Bollman, MD;  Location: [facility]VASIVE CV LAB;  Service: Cardiovascular;  Laterality: N/A;   POLYPECTOMY  09/05/2017   Procedure: POLYPECTOMY;  Surgeon: West Bali, MD;  Location: [facility];  Service: Endoscopy;;  sigmoid and rectal   TUBAL LIGATION      FAMILY HISTORY: Family History  Problem Relation Age of Onset   Diabetes Mother    Stroke Mother    Depression Mother    Lung cancer Mother    Colon cancer Father 37       Passed away from colon cancer   Colon cancer Brother 44       Passed away from colon cancer   Bipolar disorder Son     SOCIAL HISTORY: Social History   Socioeconomic History   Marital status: Legally Separated    Spouse name: Not on file   Number of children: Not on file   Years of education: Not on file   Highest education level: Not on file  Occupational History   Not on file  Tobacco Use   Smoking status: Every Day    Current packs/day: 1.00    Average packs/day: 1 pack/day for 40.0 years (40.0 ttl pk-yrs)    Types: Cigarettes   Smokeless tobacco: Never   Tobacco comments:    one pack a day. Pt has purchased nicotine patches. She has a plan to stop and is going to set a quit date.   Vaping Use   Vaping status: Never Used  Substance and Sexual Activity   Alcohol use: No   Drug use: No   Sexual activity: Not Currently    Birth control/protection: Surgical  Other Topics Concern   Not on file  Social History Narrative   Not on file   Social Drivers of Health   Financial Resource Strain: Not on file  Food Insecurity: Low Risk  (01/11/2024)   Received from [facility]   Hunger Vital Sign    Worried About Running Out of Food in the Last Year: Never true    Ran Out of Food in the Last Year: Never true  Transportation Needs: No Transportation Needs (01/11/2024)   Received from [facility]    In the past 12 months, has lack of reliable transportation kept you from medical appointments, meetings, work or  from getting things needed for daily living? : No  Physical Activity: Not on file  Stress: Not on file  Social Connections: Not on file  Intimate Partner Violence: Not on file     PHYSICAL EXAM  GENERAL EXAM/CONSTITUTIONAL: Vitals:  There were no vitals filed for this visit.   There is no height or weight on file to calculate BMI. Wt Readings from Last 3 Encounters:  01/02/24 189 lb 12.8 oz (86.1 kg)  12/18/23 189 lb 9.6 oz (86 kg)  12/07/23 190 lb (86.2 kg)   Patient is in no distress; well developed, nourished and groomed; neck is supple  CARDIOVASCULAR: Examination of carotid arteries is normal; no carotid bruits Regular rate and rhythm, no murmurs Examination of peripheral vascular system by observation and palpation is normal   NEUROLOGIC: MENTAL STATUS:  awake, alert, oriented to person, place and time recent and remote memory intact normal attention and concentration language fluent, comprehension intact, naming intact fund of knowledge appropriate  CRANIAL NERVE:  2nd - no papilledema on fundoscopic exam 2nd, 3rd, 4th, 6th - pupils equal and reactive to light, visual fields full to confrontation, extraocular muscles intact, no nystagmus 5th - facial sensation symmetric 7th - facial strength symmetric 8th - hearing intact 9th - palate elevates symmetrically, uvula midline 11th - shoulder shrug symmetric 12th - tongue protrusion midline  MOTOR:  normal bulk and tone, full strength in the BUE, BLE  SENSORY:  normal and symmetric to light touch, temperature, vibration  COORDINATION:  finger-nose-finger, fine finger movements normal  REFLEXES:  deep tendon reflexes TRACE and symmetric  GAIT/STATION:  narrow based gait     DIAGNOSTIC DATA (LABS, IMAGING, TESTING) - I reviewed patient records, labs, notes, testing and imaging myself where available.  Lab Results  Component Value Date   WBC 13.5 (H) 08/16/2023   HGB 16.0 (H) 08/16/2023   HCT  48.5 (H) 08/16/2023   MCV 94.4 08/16/2023   PLT 248 08/16/2023      Component Value Date/Time   NA 138 08/16/2023 0834   K 4.2 08/16/2023 0834   CL 105 08/16/2023 0834   CO2 23 08/16/2023 0834   GLUCOSE 157 (H) 08/16/2023 0834   BUN 18 08/16/2023 0834   CREATININE 1.08 (H) 08/16/2023 0834   CALCIUM 8.5 (L) 08/16/2023 0834   PROT 7.2 08/16/2023 0834   ALBUMIN 3.8 08/16/2023 0834   AST 26 08/16/2023 0834   ALT 46 (H) 08/16/2023 0834   ALKPHOS 79 08/16/2023 0834   BILITOT 0.4 08/16/2023 0834   GFRNONAA 58 (L) 08/16/2023 0834   GFRAA >60 11/18/2018 2221   Lab Results  Component Value Date   CHOL 122 11/15/2021   HDL 53 11/15/2021   LDLCALC 44 11/15/2021   TRIG 125 11/15/2021   CHOLHDL 2.3 11/15/2021   Lab Results  Component Value Date   HGBA1C 10.4 (H) 06/26/2021   No results Kelly for: "VITAMINB12" Lab Results  Component Value Date   TSH 1.518 06/26/2021    11/24/21 MRI cervical spine  1. Diffuse cervical spine spondylosis as described above. 2.  No acute osseous injury of the cervical spine.    ASSESSMENT AND PLAN  63 y.o. year old female here with:   Dx:  No diagnosis Kelly.     PLAN:  MIGRAINE WITH AURA (~8 per month)  MIGRAINE TREATMENT PLAN:  -Suspect persist daily headaches and worsening migraines over the past several months in setting of increased stressors  -Prevention: Continue Ajovy monthly injection, discussed trying  a different injection but not interested in switching at this time.  -Rescue: Try Bernita Raisin, can repeat after 2 hrs if needed. Limited benefit with Nurtec.  Triptans contraindicated d/t hx of MI -Limit use of OTC pain reliever medications to 5-10 per month to avoid overuse headache  Tried/failed: Topiramate, amitriptyline, ? botox (patient reports previously having multiple injections in the back of her head and across forehead but doesn't remember what the medication was)   NECK PAIN / DEGENERATIVE SPINE DZ / RIGHT ARM  PAIN - follow up per Dr. Mikal Plane (neurosurgery) -Offered PT for possible dry needling but declines interest at this time    No orders of the defined types were placed in this encounter.  No follow-ups on file.      I spent 25 minutes of face-to-face and non-face-to-face time with patient.  This included previsit chart review, lab review, study review, order entry, electronic health record documentation, patient education and discussion regarding above diagnoses and treatment plan and answered all other questions to patient's satisfaction  Ihor Austin, [facility]  [facility] [address] [place], [place] [address]  Phone [phone] Fax [phone] Note: This document was prepared with digital dictation and possible smart phrase technology. Any transcriptional errors that result from this process are unintentional.

## 2024-01-31 ENCOUNTER — Telehealth: Payer: Self-pay | Admitting: Adult Health

## 2024-01-31 ENCOUNTER — Encounter: Payer: Self-pay | Admitting: Adult Health

## 2024-01-31 ENCOUNTER — Encounter: Payer: Self-pay | Admitting: Gastroenterology

## 2024-01-31 ENCOUNTER — Ambulatory Visit (INDEPENDENT_AMBULATORY_CARE_PROVIDER_SITE_OTHER): Admitting: Adult Health

## 2024-01-31 ENCOUNTER — Ambulatory Visit (INDEPENDENT_AMBULATORY_CARE_PROVIDER_SITE_OTHER): Admitting: Gastroenterology

## 2024-01-31 VITALS — BP 133/78 | HR 93 | Temp 97.9°F | Ht 61.5 in | Wt 191.2 lb

## 2024-01-31 VITALS — BP 119/78 | HR 94 | Ht 61.0 in | Wt 187.0 lb

## 2024-01-31 DIAGNOSIS — R519 Headache, unspecified: Secondary | ICD-10-CM

## 2024-01-31 DIAGNOSIS — M542 Cervicalgia: Secondary | ICD-10-CM | POA: Diagnosis not present

## 2024-01-31 DIAGNOSIS — R7401 Elevation of levels of liver transaminase levels: Secondary | ICD-10-CM | POA: Diagnosis not present

## 2024-01-31 DIAGNOSIS — K5904 Chronic idiopathic constipation: Secondary | ICD-10-CM

## 2024-01-31 DIAGNOSIS — M545 Low back pain, unspecified: Secondary | ICD-10-CM

## 2024-01-31 DIAGNOSIS — K5900 Constipation, unspecified: Secondary | ICD-10-CM

## 2024-01-31 DIAGNOSIS — Z800 Family history of malignant neoplasm of digestive organs: Secondary | ICD-10-CM

## 2024-01-31 DIAGNOSIS — G43109 Migraine with aura, not intractable, without status migrainosus: Secondary | ICD-10-CM

## 2024-01-31 DIAGNOSIS — R140 Abdominal distension (gaseous): Secondary | ICD-10-CM | POA: Diagnosis not present

## 2024-01-31 DIAGNOSIS — K219 Gastro-esophageal reflux disease without esophagitis: Secondary | ICD-10-CM

## 2024-01-31 DIAGNOSIS — R7989 Other specified abnormal findings of blood chemistry: Secondary | ICD-10-CM

## 2024-01-31 MED ORDER — PRUCALOPRIDE SUCCINATE 2 MG PO TABS: 2.0000 mg | ORAL_TABLET | Freq: Every day | ORAL | 5 refills | Status: DC

## 2024-01-31 MED ORDER — AJOVY 225 MG/1.5ML ~~LOC~~ SOAJ: 225.0000 mg | SUBCUTANEOUS | 11 refills | Status: AC

## 2024-01-31 MED ORDER — UBRELVY 100 MG PO TABS: 100.0000 mg | ORAL_TABLET | ORAL | 11 refills | Status: DC | PRN

## 2024-01-31 NOTE — Telephone Encounter (Signed)
 Referral for neurosurgery fax to [facility]. Phone: [phone], Fax: [phone]

## 2024-01-31 NOTE — Patient Instructions (Addendum)
 Your Plan:  Continue Ajovy monthly injection   Try Ubrelvy to use as needed for migraine rescue, can repeat x1 after 2 hours if needed   Referral placed to neurosurgery in [facility] for second opinion on lower back and evaluation of neck pain      Follow up in 6 months or call earlier if needed     Thank you for coming to see [place] at [facility]. I hope we have been able to provide you high quality care today.  You may receive a patient satisfaction survey over the next few weeks. We would appreciate your feedback and comments so that we may continue to improve ourselves and the health of our patients.

## 2024-01-31 NOTE — Patient Instructions (Addendum)
 Please let me know when you are ready to schedule colonoscopy given your family history of colon cancer.  You were due for colonoscopy in November 2023. Continue pantoprazole 40 mg daily before breakfast. Start Motegrity 2 mg daily for constipation. RX sent to your pharmacy. This will take the place of Linzess. You may still need to use MiraLAX daily as needed but hopefully after starting Motegrity you will be able to discontinue MiraLAX. Please complete labs whenever you are ready. Please limit foods from the list below to no more than 1-2 times per week because they can increase gas and bloating.  Foods to limit due to gas/bloating: Fruits Fresh, dried, and juiced forms of apple, pear, watermelon, peach, plum, cherries, apricots, blackberries, boysenberries, figs, nectarines, and mango. Avocado. Vegetables Chicory root, artichoke, asparagus, cabbage, snow peas, Brussels sprouts, broccoli, sugar snap peas, mushrooms, celery, and cauliflower. Onions, garlic, leeks, and the white part of scallions. Grains Wheat, including kamut, durum, and semolina. Barley and bulgur. Couscous. Wheat-based cereals. Wheat noodles, bread, crackers, and pastries. Meats and other proteins Fried or fatty meat. Sausage. Cashews and pistachios. Soybeans, baked beans, black beans, chickpeas, kidney beans, fava beans, navy beans, lentils, black-eyed peas, and split peas. Dairy Milk, yogurt, ice cream, and soft cheese. Cream and sour cream. Milk-based sauces. Custard. Buttermilk. Soy milk. Seasoning and other foods Any sugar-free gum or candy. Foods that contain artificial sweeteners such as sorbitol, mannitol, isomalt, or xylitol. Foods that contain honey, high-fructose corn syrup, or agave. Bouillon, vegetable stock, beef stock, and chicken stock. Garlic and onion powder. Condiments made with onion, such as hummus, chutney, pickles, relish, salad dressing, and salsa. Tomato paste. Beverages Chicory-based drinks. Coffee  substitutes. Chamomile tea. Fennel tea. Sweet or fortified wines such as port or sherry. Diet soft drinks made with isomalt, mannitol, maltitol, sorbitol, or xylitol. Apple, pear, and mango juice. Juices with high-fructose corn syrup. The items listed above may not be a complete list of foods and beverages you should avoid. Contact a dietitian for more information.

## 2024-02-01 ENCOUNTER — Ambulatory Visit: Payer: 59 | Admitting: Adult Health

## 2024-02-01 NOTE — Patient Instructions (Signed)

## 2024-02-02 ENCOUNTER — Ambulatory Visit (INDEPENDENT_AMBULATORY_CARE_PROVIDER_SITE_OTHER): Admitting: Nurse Practitioner

## 2024-02-02 ENCOUNTER — Encounter: Payer: Self-pay | Admitting: Nurse Practitioner

## 2024-02-02 VITALS — BP 124/84 | HR 92 | Ht 61.5 in | Wt 187.0 lb

## 2024-02-02 DIAGNOSIS — Z794 Long term (current) use of insulin: Secondary | ICD-10-CM | POA: Diagnosis not present

## 2024-02-02 DIAGNOSIS — E1165 Type 2 diabetes mellitus with hyperglycemia: Secondary | ICD-10-CM

## 2024-02-02 DIAGNOSIS — E782 Mixed hyperlipidemia: Secondary | ICD-10-CM | POA: Diagnosis not present

## 2024-02-02 DIAGNOSIS — I10 Essential (primary) hypertension: Secondary | ICD-10-CM

## 2024-02-02 MED ORDER — ACCU-CHEK GUIDE TEST VI STRP: ORAL_STRIP | 12 refills | Status: DC

## 2024-02-02 NOTE — Progress Notes (Signed)
 Endocrinology Consult Note       02/02/2024, 11:41 AM   Subjective:    Patient ID: Katelyn Kelly, female    DOB: [birth year].  BRYNNE DOANE is being seen in consultation for management of currently uncontrolled symptomatic diabetes requested by  Gwenlyn Found, MD.   Past Medical History:  Diagnosis Date   Anxiety and depression    Bilateral carpal tunnel syndrome    Chronic back pain    Chronic neck pain    Depression    Diabetes mellitus    GERD (gastroesophageal reflux disease)    H/O syncope    Headache    History of panic attacks    Hypertension    IBS (irritable bowel syndrome)    Manic depression (HCC)    Migraine    Panic attack     Past Surgical History:  Procedure Laterality Date   ABDOMINAL HYSTERECTOMY     ABDOMINAL SURGERY     APPENDECTOMY     BIOPSY  09/05/2017   Procedure: BIOPSY;  Surgeon: West Bali, MD;  Location: [facility];  Service: Endoscopy;;  random colon   BIOPSY  05/01/2018   Procedure: BIOPSY;  Surgeon: West Bali, MD;  Location: [facility];  Service: Endoscopy;;  gastric biopsy   CARPAL TUNNEL RELEASE Bilateral    CHOLECYSTECTOMY     COLONOSCOPY WITH PROPOFOL N/A 09/05/2017   Procedure: COLONOSCOPY WITH PROPOFOL;  Surgeon: West Bali, MD; normal TI, 5 small polyps, diverticulosis in the cecum, internal and external hemorrhoids.  Random colon biopsies benign.  Colon polyps were hyperplastic.  Repeat in 2023.   CORONARY BALLOON ANGIOPLASTY N/A 06/28/2021   Procedure: CORONARY BALLOON ANGIOPLASTY;  Surgeon: Tonny Bollman, MD;  Location: [facility] INVASIVE CV LAB;  Service: Cardiovascular;  Laterality: N/A;   DILATION AND CURETTAGE OF UTERUS     ESOPHAGOGASTRODUODENOSCOPY (EGD) WITH PROPOFOL N/A 05/01/2018   Procedure: ESOPHAGOGASTRODUODENOSCOPY (EGD) WITH PROPOFOL;  Surgeon: West Bali, MD;  normal esophagus, small hiatal hernia, mild gastritis s/p  biopsy, normal duodenum.  No H. pylori.    HERNIA REPAIR     INCISIONAL HERNIA REPAIR N/A 11/06/2020   Procedure: HERNIA REPAIR INCISIONAL, PRIMARY REPAIR;  Surgeon: Lucretia Roers, MD;  Location: [facility];  Service: General;  Laterality: N/A;   LEFT HEART CATH AND CORONARY ANGIOGRAPHY N/A 06/28/2021   Procedure: LEFT HEART CATH AND CORONARY ANGIOGRAPHY;  Surgeon: Tonny Bollman, MD;  Location: [facility] INVASIVE CV LAB;  Service: Cardiovascular;  Laterality: N/A;   POLYPECTOMY  09/05/2017   Procedure: POLYPECTOMY;  Surgeon: West Bali, MD;  Location: [facility];  Service: Endoscopy;;  sigmoid and rectal   TUBAL LIGATION      Social History   Socioeconomic History   Marital status: Legally Separated    Spouse name: Not on file   Number of children: Not on file   Years of education: Not on file   Highest education level: Not on file  Occupational History   Not on file  Tobacco Use   Smoking status: Every Day    Current packs/day: 1.00    Average packs/day: 1 pack/day for 40.0 years (40.0 ttl pk-yrs)  Types: Cigarettes   Smokeless tobacco: Never   Tobacco comments:    one pack a day. Pt has purchased nicotine patches. She has a plan to stop and is going to set a quit date.   Vaping Use   Vaping status: Never Used  Substance and Sexual Activity   Alcohol use: No   Drug use: No   Sexual activity: Not Currently    Birth control/protection: Surgical  Other Topics Concern   Not on file  Social History Narrative   Not on file   Social Drivers of Health   Financial Resource Strain: Not on file  Food Insecurity: Low Risk  (01/11/2024)   Received from [facility]   Hunger Vital Sign    Worried About Running Out of Food in the Last Year: Never true    Ran Out of Food in the Last Year: Never true  Transportation Needs: No Transportation Needs (01/11/2024)   Received from [facility]    In the past 12 months, has lack of reliable transportation kept you from  medical appointments, meetings, work or from getting things needed for daily living? : No  Physical Activity: Not on file  Stress: Not on file  Social Connections: Not on file    Family History  Problem Relation Age of Onset   Diabetes Mother    Stroke Mother    Depression Mother    Lung cancer Mother    Colon cancer Father 26       Passed away from colon cancer   Colon cancer Brother 76       Passed away from colon cancer   Bipolar disorder Son     Outpatient Encounter Medications as of 02/02/2024  Medication Sig   ALPRAZolam (XANAX) 1 MG tablet Take by mouth.   aspirin EC 81 MG tablet Take 1 tablet (81 mg total) by mouth daily. Swallow whole.   atorvastatin (LIPITOR) 40 MG tablet Take 1 tablet by mouth daily.   busPIRone (BUSPAR) 7.5 MG tablet Take by mouth See admin instructions. Take 0.5 tablets (3.75 mg total) by mouth 3 times daily for 7 days, THEN 1 tablet (7.5 mg total) 3 times daily for 7 days, THEN 2 tablets (15 mg total) 3 times daily   Fremanezumab-vfrm (AJOVY) 225 MG/1.5ML SOAJ Inject 225 mg into the skin every 30 (thirty) days.   furosemide (LASIX) 20 MG tablet Take 1 tablet (20 mg total) by mouth daily as needed for fluid (Swelling).   glucose blood (ACCU-CHEK GUIDE TEST) test strip Use as instructed to monitor glucose 4 times daily   insulin degludec (TRESIBA) 100 UNIT/ML FlexTouch Pen Inject 30 Units into the skin at bedtime.   insulin lispro (HUMALOG) 100 UNIT/ML KwikPen Inject 5-11 Units into the skin See admin instructions.   ipratropium (ATROVENT HFA) 17 MCG/ACT inhaler Inhale 2 puffs into the lungs every 6 (six) hours as needed for wheezing.   isosorbide mononitrate (IMDUR) 60 MG 24 hr tablet Take 1 tablet (60 mg total) by mouth daily.   Menthol, Topical Analgesic, (BIOFREEZE) 10 % LIQD Apply 1 application topically daily as needed (pain).   metoprolol tartrate (LOPRESSOR) 25 MG tablet TAKE (1/2) TABLET BY MOUTH TWICE DAILY   nitroGLYCERIN (NITROSTAT) 0.4 MG SL  tablet PLACE ONE TABLET UNDER THE TOUNGE EVERY FIVE MINUTES FOR 3 DOSES AS NEEDED FOR CHEST PAINS.   ondansetron (ZOFRAN-ODT) 4 MG disintegrating tablet Take 1 tablet (4 mg total) by mouth every 8 (eight) hours as needed  for nausea or vomiting.   pramipexole (MIRAPEX) 0.25 MG tablet Take 0.25 mg by mouth at bedtime.   Prucalopride Succinate (MOTEGRITY) 2 MG TABS Take 1 tablet (2 mg total) by mouth daily.   SURE COMFORT PEN NEEDLES 32G X 4 MM MISC    Ubrogepant (UBRELVY) 100 MG TABS Take 1 tablet (100 mg total) by mouth as needed.   [DISCONTINUED] ACCU-CHEK GUIDE test strip    methocarbamol (ROBAXIN) 750 MG tablet Take 1 tablet (750 mg total) by mouth 2 (two) times daily. (Patient not taking: Reported on 02/02/2024)   NURTEC 75 MG TBDP TAKE ONE TABLET DAILY AS NEEDED. (Patient not taking: Reported on 02/02/2024)   [DISCONTINUED] calcium carbonate (TUMS - DOSED IN MG ELEMENTAL CALCIUM) 500 MG chewable tablet Chew 1,000 mg by mouth daily as needed for indigestion or heartburn. (Patient not taking: Reported on 02/02/2024)   [DISCONTINUED] cyclobenzaprine (FLEXERIL) 5 MG tablet Take 1 tablet (5 mg total) by mouth at bedtime. (Patient not taking: Reported on 02/02/2024)   [DISCONTINUED] pantoprazole (PROTONIX) 40 MG tablet Take 1 tablet (40 mg total) by mouth daily before breakfast. (Patient not taking: Reported on 02/02/2024)   [DISCONTINUED] SOLIQUA 100-33 UNT-MCG/ML SOPN SMARTSIG:30 Unit(s) SUB-Q Every Morning (Patient not taking: Reported on 02/02/2024)   No facility-administered encounter medications on file as of 02/02/2024.    ALLERGIES: Allergies  Allergen Reactions   Meprobamate Hives and Swelling   Sinequan [Doxepin Hcl] Anaphylaxis   Soliqua [Insulin Glargine-Lixisenatide] Other (See Comments)    Flu like symptoms , Shortness of breathe   Tranxene [Clorazepate Dipotassium] Hives and Swelling   Albuterol Other (See Comments)    Patient does not remember the reaction. This was a record provided via  [facility]   Haloperidol Other (See Comments)    Patient does not remember the reaction. This was a record provided via [facility]   Sertraline Other (See Comments)    Patient does not remember the reaction. This was a record provided via [facility]   Vortioxetine Other (See Comments)    Patient does not remember the reaction. This was a record provided via [facility]   Elavil [Amitriptyline] Palpitations    VACCINATION STATUS: Immunization History  Administered Date(s) Administered   Tdap 07/06/2015    Diabetes She presents for her initial diabetic visit. She has type 2 diabetes mellitus. Onset time: diagnosed at approx age of 78. Her disease course has been fluctuating. Hypoglycemia symptoms include nervousness/anxiousness, sweats and tremors. There are no diabetic associated symptoms. There are no hypoglycemic complications. Symptoms are stable. 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 Risk factors for coronary artery disease include diabetes mellitus, dyslipidemia, family history, hypertension, tobacco exposure and post-menopausal. Current diabetic treatment includes intensive insulin program. She is compliant with treatment most of the time. Her weight is fluctuating minimally. She is following a generally unhealthy diet. When asked about meal planning, she reported none. She has not had a previous visit with a dietitian. She participates in exercise intermittently. Her overall blood glucose range is >200 mg/dl. (She presents today for her consultation with her meter showing fluctuating glycemic profile with hyperglycemia overall.  Her most recent A1c was 7.7% on 12/20/23.  She has tried many different medications for her diabetes in the past but had several intolerances.  She drinks mainly coke zero and will have unsweet tea (with splenda), or flavored waters.  She eats 1 meals per day and snacks between.  She is  unable to  exercise optimally due to chronic back issues.  She is UTD on eye exam and sees podiatry routinely.  Analysis of her meter shows 7-day average of 250, 14-day average of 231, 30-day average of 238, 90-day average of 244.) An ACE inhibitor/angiotensin II receptor blocker is not being taken. She does not see a podiatrist.Eye exam is current.     Review of systems  Constitutional: + Minimally fluctuating body weight, current Body mass index is 34.76 kg/m., no fatigue, no subjective hyperthermia, no subjective hypothermia Eyes: no blurry vision, no xerophthalmia ENT: no sore throat, no nodules palpated in throat, no dysphagia/odynophagia, no hoarseness Cardiovascular: no chest pain, no shortness of breath, no palpitations, + intermittent leg swelling Respiratory: no cough, no shortness of breath Gastrointestinal: no nausea/vomiting/diarrhea Musculoskeletal: chronic back pain Skin: no rashes, no hyperemia Neurological: no tremors, no numbness, no tingling, no dizziness Psychiatric: no depression, no anxiety  Objective:     BP 124/84 (BP Location: Left Arm, Patient Position: Sitting, Cuff Size: Large)   Pulse 92   Ht 5' 1.5" (1.562 m)   Wt 187 lb (84.8 kg)   BMI 34.76 kg/m   Wt Readings from Last 3 Encounters:  02/02/24 187 lb (84.8 kg)  01/31/24 187 lb (84.8 kg)  01/31/24 191 lb 3.2 oz (86.7 kg)     BP Readings from Last 3 Encounters:  02/02/24 124/84  01/31/24 119/78  01/31/24 133/78     Physical Exam- Limited  Constitutional:  Body mass index is 34.76 kg/m. , not in acute distress, normal state of mind Eyes:  EOMI, no exophthalmos Neck: Supple Cardiovascular: RRR, no murmurs, rubs, or gallops, no edema Respiratory: Adequate breathing efforts, no crackles, rales, rhonchi, or wheezing Musculoskeletal: no gross deformities, strength intact in all four extremities, no gross restriction of joint movements Skin:  no rashes, no hyperemia Neurological: no tremor with  outstretched hands   Diabetic Foot Exam - Simple   No data filed      CMP ( most recent) CMP     Component Value Date/Time   NA 138 08/16/2023 0834   K 4.2 08/16/2023 0834   CL 105 08/16/2023 0834   CO2 23 08/16/2023 0834   GLUCOSE 157 (H) 08/16/2023 0834   BUN 18 08/16/2023 0834   CREATININE 1.08 (H) 08/16/2023 0834   CALCIUM 8.5 (L) 08/16/2023 0834   PROT 7.2 08/16/2023 0834   ALBUMIN 3.8 08/16/2023 0834   AST 26 08/16/2023 0834   ALT 46 (H) 08/16/2023 0834   ALKPHOS 79 08/16/2023 0834   BILITOT 0.4 08/16/2023 0834   GFRNONAA 58 (L) 08/16/2023 0834     Diabetic Labs (most recent): Lab Results  Component Value Date   HGBA1C 7.7 12/20/2023   HGBA1C 10.4 (H) 06/26/2021   HGBA1C 6.7 (H) 08/29/2017     Lipid Panel ( most recent) Lipid Panel     Component Value Date/Time   CHOL 122 11/15/2021 0923   TRIG 125 11/15/2021 0923   HDL 53 11/15/2021 0923   CHOLHDL 2.3 11/15/2021 0923   VLDL 25 11/15/2021 0923   LDLCALC 44 11/15/2021 0923      Lab Results  Component Value Date   TSH 1.518 06/26/2021           Assessment & Plan:   1) Type 2 diabetes mellitus with hyperglycemia, with long-term current use of insulin (HCC) (Primary)  She presents today for her consultation with her meter showing fluctuating glycemic profile with hyperglycemia overall.  Her most recent A1c was  7.7% on 12/20/23.  She has tried many different medications for her diabetes in the past but had several intolerances.  She drinks mainly coke zero and will have unsweet tea (with splenda), or flavored waters.  She eats 1 meals per day and snacks between.  She is unable to exercise optimally due to chronic back issues.  She is UTD on eye exam and sees podiatry routinely.  Analysis of her meter shows 7-day average of 250, 14-day average of 231, 30-day average of 238, 90-day average of 244.  - NIDA MANFREDI has currently uncontrolled symptomatic type 2 DM since 63 years of age, with most  recent A1c of 7.7 %.   -Recent labs reviewed.  - I had a long discussion with her about the progressive nature of diabetes and the pathology behind its complications. 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 These are all discussed in detail with her.  The following Lifestyle Medicine recommendations according to American College of Lifestyle Medicine [facility]) were discussed and offered to patient and she agrees to start the journey:  A. Whole Foods, Plant-based plate comprising of fruits and vegetables, plant-based proteins, whole-grain carbohydrates was discussed in detail with the patient.   A list for source of those nutrients were also provided to the patient.  Patient will use only water or unsweetened tea for hydration. B.  The need to stay away from risky substances including alcohol, smoking; obtaining 7 to 9 hours of restorative sleep, at least 150 minutes of moderate intensity exercise weekly, the importance of healthy social connections,  and stress reduction techniques were discussed. C.  A full color page of  Calorie density of various food groups per pound showing examples of each food groups was provided to the patient.  - I have counseled her on diet and weight management by adopting a carbohydrate restricted/protein rich diet. Patient is encouraged to switch to unprocessed or minimally processed complex starch and increased protein intake (animal or plant source), fruits, and vegetables. -  she is advised to stick to a routine mealtimes to eat 3 meals a day and avoid unnecessary snacks (to snack only to correct hypoglycemia).   - she acknowledges that there is a room for improvement in her food and drink choices. - Suggestion is made for her to avoid simple carbohydrates from her diet including Cakes, Sweet Desserts, Ice Cream, Soda (diet  and regular), Sweet Tea, Candies, Chips, Cookies, Store Bought Juices, Alcohol in Excess of 1-2 drinks a day, Artificial Sweeteners, Coffee Creamer, and "Sugar-free" Products. This will help patient to have more stable blood glucose profile and potentially avoid unintended weight gain.  - I have approached her with the following individualized plan to manage her diabetes and patient agrees:   - She is advised to adjust her Evaristo Bury to 30 units SQ nightly and adjust her Lispro to 5-11 units TID with meals if glucose is above 90 and she is eating (Specific instructions on how to titrate insulin dosage based on glucose readings given to patient in writing).  She demonstrated her ability to properly use the SSI chart to dose meal time insulin with me today.  -she is encouraged to start/continue monitoring glucose 4 times daily, before meals and before bed, to log their readings on the clinic sheets provided, and bring them to review at follow up appointment in  3 months.  She could certainly benefit from a CGM, however she used on in the past and had a bad experience and prefers to do traditional fingersticks for now.  - she is warned not to take insulin without proper monitoring per orders. - Adjustment parameters are given to her for hypo and hyperglycemia in writing. - she is encouraged to call clinic for blood glucose levels less than 70 or above 300 mg /dl.  - she is not an ideal candidate for incretin therapy given heavy smoking history which increases her risk of pancreatitis with the GLP1 class.  She has tried this in the past and developed severe constipation.  She has also tried Jardiance in the past but did not tolerate it due to yeast formation.  Januvia, Glipizide, and Metformin were all tried in the past as well but stopped due to nausea.  - Specific targets for  A1c; LDL, HDL, and Triglycerides were discussed with the patient.  2) Blood Pressure /Hypertension:  her blood pressure is  controlled to target.   she is advised to continue her current medications as prescribed by her PCP.  3) Lipids/Hyperlipidemia:    Review of her recent lipid panel from 2023 showed controlled LDL at 44.  she is advised to continue Lipitor 80 mg daily at bedtime.  Side effects and precautions discussed with her.  4)  Weight/Diet:  her Body mass index is 34.76 kg/m.  -  clearly complicating her diabetes care.   she is a candidate for weight loss. I discussed with her the fact that loss of 5 - 10% of her  current body weight will have the most impact on her diabetes management.  Exercise, and detailed carbohydrates information provided  -  detailed on discharge instructions.  5) Chronic Care/Health Maintenance: -she is not on ACEI/ARB and is on Statin medications and is encouraged to initiate and continue to follow up with Ophthalmology, Dentist, Podiatrist at least yearly or according to recommendations, and advised to QUIT SMOKING (already in the process of quitting). I have recommended yearly flu vaccine and pneumonia vaccine at least every 5 years; moderate intensity exercise for up to 150 minutes weekly; and sleep for at least 7 hours a day.  - she is advised to maintain close follow up with Gwenlyn Found, MD for primary care needs, as well as her other providers for optimal and coordinated care.   - Time spent in this patient care: 60 min, of which > 50% was spent in counseling her about her diabetes and the rest reviewing her blood glucose logs, discussing her hypoglycemia and hyperglycemia episodes, reviewing her current and previous labs/studies (including abstraction from other facilities) and medications doses and developing a long term treatment plan based on the latest standards of care/guidelines; and documenting her care.    Please refer to Patient Instructions for Blood Glucose Monitoring and Insulin/Medications Dosing Guide" in media tab for additional information. Please also  refer to "Patient Self Inventory" in the Media tab for reviewed elements of pertinent patient history.  Louie Bun participated in the discussions, expressed understanding, and voiced agreement with the above plans.  All questions were answered to her satisfaction. she is encouraged to contact clinic should she have any questions or concerns prior to her return visit.     Follow up plan: - Return in about 4 months (around 06/03/2024) for Diabetes F/U with A1c in office, No previsit labs, Bring meter and logs.    Ronny Bacon, FNP-BC  [facility] [address] [place], [place] [address] Phone: [phone] Fax: [phone]  02/02/2024, 11:41 AM

## 2024-02-05 ENCOUNTER — Other Ambulatory Visit (HOSPITAL_COMMUNITY)
Admission: RE | Admit: 2024-02-05 | Discharge: 2024-02-05 | Disposition: A | Discharge status: Home / Self Care | Source: Ambulatory Visit | Attending: Cardiology | Admitting: Cardiology

## 2024-02-05 ENCOUNTER — Other Ambulatory Visit (HOSPITAL_COMMUNITY)
Admission: RE | Admit: 2024-02-05 | Discharge: 2024-02-05 | Disposition: A | Discharge status: Home / Self Care | Source: Ambulatory Visit | Attending: Gastroenterology | Admitting: Gastroenterology

## 2024-02-05 DIAGNOSIS — I2510 Atherosclerotic heart disease of native coronary artery without angina pectoris: Secondary | ICD-10-CM | POA: Insufficient documentation

## 2024-02-05 DIAGNOSIS — R140 Abdominal distension (gaseous): Secondary | ICD-10-CM | POA: Diagnosis present

## 2024-02-05 DIAGNOSIS — K5900 Constipation, unspecified: Secondary | ICD-10-CM | POA: Diagnosis not present

## 2024-02-05 DIAGNOSIS — R7989 Other specified abnormal findings of blood chemistry: Secondary | ICD-10-CM | POA: Diagnosis not present

## 2024-02-05 LAB — LIPID PANEL
Cholesterol: 119 mg/dL (ref 0–200)
HDL: 60 mg/dL (ref >40)
LDL Cholesterol: 46 mg/dL (ref 0–99)
Total CHOL/HDL Ratio: 2 ratio
Triglycerides: 64 mg/dL (ref <150)
VLDL: 13 mg/dL (ref 0–40)

## 2024-02-05 LAB — HEPATIC FUNCTION PANEL
ALT: 30 U/L (ref 0–44)
AST: 21 U/L (ref 15–41)
Albumin: 3.7 g/dL (ref 3.5–5.0)
Alkaline Phosphatase: 91 U/L (ref 38–126)
Bilirubin, Direct: 0.1 mg/dL (ref 0.0–0.2)
Indirect Bilirubin: 0.4 mg/dL (ref 0.3–0.9)
Total Bilirubin: 0.5 mg/dL (ref 0.0–1.2)
Total Protein: 7.3 g/dL (ref 6.5–8.1)

## 2024-02-05 LAB — HEPATITIS C ANTIBODY: HCV Ab: NONREACTIVE

## 2024-02-05 LAB — HEPATITIS A ANTIBODY, TOTAL: hep A Total Ab: NONREACTIVE

## 2024-02-05 LAB — FERRITIN: Ferritin: 53 ng/mL (ref 11–307)

## 2024-02-05 LAB — IRON AND TIBC
Iron: 67 ug/dL (ref 28–170)
Saturation Ratios: 20 % (ref 10.4–31.8)
TIBC: 340 ug/dL (ref 250–450)
UIBC: 273 ug/dL

## 2024-02-05 LAB — HEPATITIS B SURFACE ANTIBODY,QUALITATIVE: Hep B S Ab: NONREACTIVE

## 2024-02-05 LAB — HEPATITIS B SURFACE ANTIGEN: Hepatitis B Surface Ag: NONREACTIVE

## 2024-02-06 LAB — HEPATITIS B CORE ANTIBODY, TOTAL: HEP B CORE AB: NEGATIVE

## 2024-03-01 NOTE — Progress Notes (Deleted)
 Referring Physician:  Johny Nap, NP [address] [place],  [place] [address]  Primary Physician:  Audria Leather, MD  History of Present Illness: 03/01/2024 Ms. Katelyn Kelly is here today with a chief complaint of ***  Left side neck pain causing headaches.  Duration: *** Location: *** Quality: *** Severity: ***  Precipitating: aggravated by *** Modifying factors: made better by *** Weakness: none Timing: *** Bowel/Bladder Dysfunction: none  Conservative measures:  Physical therapy: *** ? Multimodal medical therapy including regular antiinflammatories: Robaxin , Biofreeze  Injections: no epidural steroid injections  Past Surgery: none  [name] has ***no symptoms of cervical myelopathy.  The symptoms are causing a significant impact on the patient's life.   Review of Systems:  A 10 point review of systems is negative, except for the pertinent positives and negatives detailed in the HPI.  Past Medical History: Past Medical History:  Diagnosis Date   Anxiety and depression    Bilateral carpal tunnel syndrome    Chronic back pain    Chronic neck pain    Depression    Diabetes mellitus    GERD (gastroesophageal reflux disease)    H/O syncope    Headache    History of panic attacks    Hypertension    IBS (irritable bowel syndrome)    Manic depression (HCC)    Migraine    Panic attack     Past Surgical History: Past Surgical History:  Procedure Laterality Date   ABDOMINAL HYSTERECTOMY     ABDOMINAL SURGERY     APPENDECTOMY     BIOPSY  09/05/2017   Procedure: BIOPSY;  Surgeon: Alyce Jubilee, MD;  [facility];  Service: Endoscopy;;  random colon   BIOPSY  05/01/2018   Procedure: BIOPSY;  Surgeon: Alyce Jubilee, MD;  [facility];  Service: Endoscopy;;  gastric biopsy   CARPAL TUNNEL RELEASE Bilateral    CHOLECYSTECTOMY     COLONOSCOPY WITH PROPOFOL  N/A 09/05/2017   Procedure: COLONOSCOPY WITH PROPOFOL ;  Surgeon:  Alyce Jubilee, MD; normal TI, 5 small polyps, diverticulosis in the cecum, internal and external hemorrhoids.  Random colon biopsies benign.  Colon polyps were hyperplastic.  Repeat in 2023.   CORONARY BALLOON ANGIOPLASTY N/A 06/28/2021   Procedure: CORONARY BALLOON ANGIOPLASTY;  Surgeon: Arnoldo Lapping, MD;  Location: [facility] INVASIVE CV LAB;  Service: Cardiovascular;  Laterality: N/A;   DILATION AND CURETTAGE OF UTERUS     ESOPHAGOGASTRODUODENOSCOPY (EGD) WITH PROPOFOL  N/A 05/01/2018   Procedure: ESOPHAGOGASTRODUODENOSCOPY (EGD) WITH PROPOFOL ;  Surgeon: Alyce Jubilee, MD;  normal esophagus, small hiatal hernia, mild gastritis s/p biopsy, normal duodenum.  No H. pylori.    HERNIA REPAIR     INCISIONAL HERNIA REPAIR N/A 11/06/2020   Procedure: HERNIA REPAIR INCISIONAL, PRIMARY REPAIR;  Surgeon: Awilda Bogus, MD;  Location: [facility];  Service: General;  Laterality: N/A;   LEFT HEART CATH AND CORONARY ANGIOGRAPHY N/A 06/28/2021   Procedure: LEFT HEART CATH AND CORONARY ANGIOGRAPHY;  Surgeon: Arnoldo Lapping, MD;  Location: [facility] INVASIVE CV LAB;  Service: Cardiovascular;  Laterality: N/A;   POLYPECTOMY  09/05/2017   Procedure: POLYPECTOMY;  Surgeon: Alyce Jubilee, MD;  [facility];  Service: Endoscopy;;  sigmoid and rectal   TUBAL LIGATION      Allergies: Allergies as of 03/04/2024 - Review Complete 02/02/2024  Allergen Reaction Noted   Meprobamate Hives and Swelling 10/28/2020   Sinequan [doxepin hcl] Anaphylaxis 01/10/2012   Soliqua [insulin  glargine-lixisenatide]  Other (See Comments) 02/02/2024   Tranxene [clorazepate dipotassium] Hives and Swelling 01/10/2012   Albuterol  Other (See Comments) 11/08/2017   Haloperidol Other (See Comments) 11/08/2017   Sertraline Other (See Comments) 11/08/2017   Vortioxetine Other (See Comments) 11/08/2017   Elavil [amitriptyline] Palpitations 05/20/2022    Medications: Outpatient Encounter Medications as of 03/04/2024  Medication Sig    ALPRAZolam  (XANAX ) 1 MG tablet Take by mouth.   aspirin  EC 81 MG tablet Take 1 tablet (81 mg total) by mouth daily. Swallow whole.   atorvastatin  (LIPITOR ) 40 MG tablet Take 1 tablet by mouth daily.   busPIRone  (BUSPAR ) 7.5 MG tablet Take by mouth See admin instructions. Take 0.5 tablets (3.75 mg total) by mouth 3 times daily for 7 days, THEN 1 tablet (7.5 mg total) 3 times daily for 7 days, THEN 2 tablets (15 mg total) 3 times daily   Fremanezumab -vfrm (AJOVY ) 225 MG/1.5ML SOAJ Inject 225 mg into the skin every 30 (thirty) days.   furosemide  (LASIX ) 20 MG tablet Take 1 tablet (20 mg total) by mouth daily as needed for fluid (Swelling).   glucose blood (ACCU-CHEK GUIDE TEST) test strip Use as instructed to monitor glucose 4 times daily   insulin  degludec (TRESIBA) 100 UNIT/ML FlexTouch Pen Inject 30 Units into the skin at bedtime.   insulin  lispro (HUMALOG ) 100 UNIT/ML KwikPen Inject 5-11 Units into the skin See admin instructions.   ipratropium (ATROVENT  HFA) 17 MCG/ACT inhaler Inhale 2 puffs into the lungs every 6 (six) hours as needed for wheezing.   isosorbide  mononitrate (IMDUR ) 60 MG 24 hr tablet Take 1 tablet (60 mg total) by mouth daily.   Menthol, Topical Analgesic, (BIOFREEZE) 10 % LIQD Apply 1 application topically daily as needed (pain).   methocarbamol  (ROBAXIN ) 750 MG tablet Take 1 tablet (750 mg total) by mouth 2 (two) times daily. (Patient not taking: Reported on 02/02/2024)   metoprolol  tartrate (LOPRESSOR ) 25 MG tablet TAKE (1/2) TABLET BY MOUTH TWICE DAILY   nitroGLYCERIN  (NITROSTAT ) 0.4 MG SL tablet PLACE ONE TABLET UNDER THE TOUNGE EVERY FIVE MINUTES FOR 3 DOSES AS NEEDED FOR CHEST PAINS.   NURTEC 75 MG TBDP TAKE ONE TABLET DAILY AS NEEDED. (Patient not taking: Reported on 02/02/2024)   ondansetron  (ZOFRAN -ODT) 4 MG disintegrating tablet Take 1 tablet (4 mg total) by mouth every 8 (eight) hours as needed for nausea or vomiting.   pramipexole  (MIRAPEX ) 0.25 MG tablet Take 0.25 mg by  mouth at bedtime.   Prucalopride Succinate  (MOTEGRITY ) 2 MG TABS Take 1 tablet (2 mg total) by mouth daily.   SURE COMFORT PEN NEEDLES 32G X 4 MM MISC    Ubrogepant  (UBRELVY ) 100 MG TABS Take 1 tablet (100 mg total) by mouth as needed.   No facility-administered encounter medications on file as of 03/04/2024.    Social History: Social History   Tobacco Use   Smoking status: Every Day    Current packs/day: 1.00    Average packs/day: 1 pack/day for 40.0 years (40.0 ttl pk-yrs)    Types: Cigarettes   Smokeless tobacco: Never   Tobacco comments:    one pack a day. Pt has purchased nicotine  patches. She has a plan to stop and is going to set a quit date.   Vaping Use   Vaping status: Never Used  Substance Use Topics   Alcohol use: No   Drug use: No    Family Medical History: Family History  Problem Relation Age of Onset   Diabetes Mother  Stroke Mother    Depression Mother    Lung cancer Mother    Colon cancer Father 44       Passed away from colon cancer   Colon cancer Brother 55       Passed away from colon cancer   Bipolar disorder Son     Physical Examination: @VITALWITHPAIN @  General: Patient is well developed, well nourished, calm, collected, and in no apparent distress. Attention to examination is appropriate.  Psychiatric: Patient is non-anxious.  Head:  Pupils equal, round, and reactive to light.  ENT:  Oral mucosa appears well hydrated.  Neck:   Supple.  ***Full range of motion.  Respiratory: Patient is breathing without any difficulty.  Extremities: No edema.  Vascular: Palpable dorsal pedal pulses.  Skin:   On exposed skin, there are no abnormal skin lesions.  NEUROLOGICAL:     Awake, alert, oriented to person, place, and time.  Speech is clear and fluent. Fund of knowledge is appropriate.   Cranial Nerves: Pupils equal round and reactive to light.  Facial tone is symmetric.  Facial sensation is symmetric.  ROM of spine: ***full.  Palpation of  spine: ***non tender.    Strength: Side Biceps Triceps Deltoid Interossei Grip Wrist Ext. Wrist Flex.  R 5 5 5 5 5 5 5   L 5 5 5 5 5 5 5    Side Iliopsoas Quads Hamstring PF DF EHL  R 5 5 5 5 5 5   L 5 5 5 5 5 5    Reflexes are ***2+ and symmetric at the biceps, triceps, brachioradialis, patella and achilles.   Hoffman's is absent.  Clonus is not present.  Toes are down-going.  Bilateral upper and lower extremity sensation is intact to light touch.    Gait is normal.   No difficulty with tandem gait.   No evidence of dysmetria noted.  Medical Decision Making  Imaging: ***  I have personally reviewed the images and agree with the above interpretation.  Assessment and Plan: Ms. Salis is a pleasant 63 y.o. female with ***    Thank you for involving me in the care of this patient.   I spent a total of *** minutes in both face-to-face and non-face-to-face activities for this visit on the date of this encounter.   Ludwig Safer, PA-C Dept. of Neurosurgery

## 2024-03-04 ENCOUNTER — Ambulatory Visit: Admitting: Physician Assistant

## 2024-03-05 ENCOUNTER — Ambulatory Visit: Payer: Self-pay

## 2024-03-05 NOTE — Telephone Encounter (Signed)
  Chief Complaint: Back Pain  Symptoms: right sided back pain that goes into her legs. 8 out of 10 and worse at night Frequency: started a few weeks ago Pertinent Negatives: Patient denies fever Disposition: [] ED /[x] Urgent Care (no appt availability in office) / [] Appointment(In office/virtual)/ []  [facility]/ [] Home Care/ [] Refused Recommended Disposition /[] [facility]/ []  Follow-up with PCP Additional Notes: patient calling with the main objective of getting a new provider as her current PCP is retiring. Patient endorses right sided back pain that goes into her legs. Patient feels that this is sciatica. Patient reports pain level of 8 out of 10 and that the pain is worse at night. Per protocol, patient is recommended to be seen today. Patient is recommended to urgent Care. Patient verbalized understanding and will be heading to Urgent Care to be seen. New patient appointment made for patient with [facility] on Mar 26, 2024 at 1:00 PM. Patient verbalized understanding of appointment and particulars were reviewed with patient. All questions answered.    Copied from CRM [id number]. Topic: Clinical - Red Word Triage >> Mar 05, 2024 10:35 AM Oddis Bench wrote: Red Word that prompted transfer to Nurse Triage: Patient is calling bc she is having pain from siatica she is stating that is a level 8 pain and gets worst at night. Reason for Disposition  [1] SEVERE back pain (e.g., excruciating, unable to do any normal activities) AND [2] not improved 2 hours after pain medicine  Answer Assessment - Initial Assessment Questions 1. ONSET: "When did the pain begin?"      Started a few weeks ago 2. LOCATION: "Where does it hurt?" (upper, mid or lower back)     Right Lower pain 3. SEVERITY: "How bad is the pain?"  (e.g., Scale 1-10; mild, moderate, or severe)   - MILD (1-3): Doesn't interfere with normal activities.    - MODERATE (4-7): Interferes with normal activities or  awakens from sleep.    - SEVERE (8-10): Excruciating pain, unable to do any normal activities.      8 out of 10 4. PATTERN: "Is the pain constant?" (e.g., yes, no; constant, intermittent)      constant 5. RADIATION: "Does the pain shoot into your legs or somewhere else?"     yes 6. CAUSE:  "What do you think is causing the back pain?"      sciatica 7. BACK OVERUSE:  "Any recent lifting of heavy objects, strenuous work or exercise?"     no 8. MEDICINES: "What have you taken so far for the pain?" (e.g., nothing, acetaminophen , NSAIDS)     Ibuprofen , oxycodone  (doesn't have any of this currently) 9. NEUROLOGIC SYMPTOMS: "Do you have any weakness, numbness, or problems with bowel/bladder control?"     No 10. OTHER SYMPTOMS: "Do you have any other symptoms?" (e.g., fever, abdomen pain, burning with urination, blood in urine)       no  Protocols used: Back Pain-A-AH

## 2024-03-15 ENCOUNTER — Other Ambulatory Visit: Payer: Self-pay | Admitting: *Deleted

## 2024-03-15 DIAGNOSIS — Z794 Long term (current) use of insulin: Secondary | ICD-10-CM

## 2024-03-15 MED ORDER — INSULIN LISPRO (1 UNIT DIAL) 100 UNIT/ML (KWIKPEN): PEN_INJECTOR | SUBCUTANEOUS | 0 refills | Status: DC

## 2024-03-15 MED ORDER — INSULIN DEGLUDEC 100 UNIT/ML ~~LOC~~ SOPN: PEN_INJECTOR | SUBCUTANEOUS | 0 refills | Status: DC

## 2024-03-15 MED ORDER — SURE COMFORT PEN NEEDLES 32G X 4 MM MISC: 2 refills | Status: DC

## 2024-03-15 MED ORDER — ACCU-CHEK GUIDE TEST VI STRP: ORAL_STRIP | 12 refills | Status: DC

## 2024-03-15 NOTE — Telephone Encounter (Signed)
 Patient left a message that she was in need of her lancet needles, insulins, all diabetic supplies. A prescription sent in, patient made aware.

## 2024-03-26 ENCOUNTER — Ambulatory Visit (INDEPENDENT_AMBULATORY_CARE_PROVIDER_SITE_OTHER): Payer: Self-pay

## 2024-03-26 VITALS — BP 122/76 | HR 83 | Ht 61.0 in | Wt 184.0 lb

## 2024-03-26 DIAGNOSIS — M5440 Lumbago with sciatica, unspecified side: Secondary | ICD-10-CM | POA: Insufficient documentation

## 2024-03-26 DIAGNOSIS — M51372 Other intervertebral disc degeneration, lumbosacral region with discogenic back pain and lower extremity pain: Secondary | ICD-10-CM | POA: Insufficient documentation

## 2024-03-26 MED ORDER — METHOCARBAMOL 750 MG PO TABS
750.0000 mg | ORAL_TABLET | Freq: Three times a day (TID) | ORAL | 0 refills | Status: DC | PRN
Start: 2024-03-26 — End: 2024-04-08

## 2024-03-26 NOTE — Assessment & Plan Note (Addendum)
-  Followed by spine specialist but has a referral in place to [facility] in [place] for second opinion -Thoracic spine MRI 09/20/23 left T9-10 paracentral disc protrusion w/ mass effecct on left side of thecal sac and mild left foraminal stenosis. -She reports also other chronic lower left-sided back pain that has recently begun to radiate to the right side, extending down both legs to her toes. She described the pain as severe, likening it to a lightning strike, and noted that it has been present for over a year but had recently intensified. -Planning for discetomy but A1C was too high at that time.  It is under better control now that she is under the care of Endo.  Recommend she reach out to neurosurgery now that her blood sugars are under better control. Robaxin  also refilled for pain relief.   Referral to PT placed to see if she can get some improvement in pain control with this.

## 2024-03-26 NOTE — Patient Instructions (Signed)
 Return in 3 months for routine f/u

## 2024-03-26 NOTE — Progress Notes (Signed)
 New Patient Office Visit  Subjective    Patient ID: Katelyn Kelly, female    DOB: 1961/03/14  Age: 63 y.o. MRN: [id number]  CC:  Chief Complaint  Patient presents with   Establish Care    Pt here to establish care   HPI Katelyn Kelly presents to establish care.  She is transferring from [facility].   Katelyn Kelly is a 63 y.o. female with a PMH of IDDMT2, HTN, chronic headaches, erythrocytosis, bandemia, tobacco use, COPD, multievel DDD, anxiety    Outpatient Encounter Medications as of 03/26/2024  Medication Sig   ALPRAZolam  (XANAX ) 1 MG tablet Take by mouth.   aspirin  EC 81 MG tablet Take 1 tablet (81 mg total) by mouth daily. Swallow whole.   atorvastatin  (LIPITOR ) 40 MG tablet Take 1 tablet by mouth daily.   busPIRone  (BUSPAR ) 7.5 MG tablet Take by mouth See admin instructions. Take 0.5 tablets (3.75 mg total) by mouth 3 times daily for 7 days, THEN 1 tablet (7.5 mg total) 3 times daily for 7 days, THEN 2 tablets (15 mg total) 3 times daily   Fremanezumab -vfrm (AJOVY ) 225 MG/1.5ML SOAJ Inject 225 mg into the skin every 30 (thirty) days.   furosemide  (LASIX ) 20 MG tablet Take 1 tablet (20 mg total) by mouth daily as needed for fluid (Swelling).   glucose blood (ACCU-CHEK GUIDE TEST) test strip Use as instructed to monitor glucose 4 times daily   insulin  degludec (TRESIBA ) 100 UNIT/ML FlexTouch Pen Inject 30 units daily at bedtime as instructed by the provider.   insulin  lispro (HUMALOG ) 100 UNIT/ML KwikPen Patient is to inject 5-11 units per sliding scale as directed by the provider.   ipratropium (ATROVENT  HFA) 17 MCG/ACT inhaler Inhale 2 puffs into the lungs every 6 (six) hours as needed for wheezing.   isosorbide  mononitrate (IMDUR ) 60 MG 24 hr tablet Take 1 tablet (60 mg total) by mouth daily.   linaclotide  (LINZESS ) 290 MCG CAPS capsule Take 290 mcg by mouth daily before breakfast.   Menthol, Topical Analgesic, (BIOFREEZE) 10 % LIQD  Apply 1 application topically daily as needed (pain).   metoprolol  tartrate (LOPRESSOR ) 25 MG tablet TAKE (1/2) TABLET BY MOUTH TWICE DAILY   nitroGLYCERIN  (NITROSTAT ) 0.4 MG SL tablet PLACE ONE TABLET UNDER THE TOUNGE EVERY FIVE MINUTES FOR 3 DOSES AS NEEDED FOR CHEST PAINS.   NURTEC 75 MG TBDP TAKE ONE TABLET DAILY AS NEEDED.   ondansetron  (ZOFRAN -ODT) 4 MG disintegrating tablet Take 1 tablet (4 mg total) by mouth every 8 (eight) hours as needed for nausea or vomiting.   pramipexole  (MIRAPEX ) 0.25 MG tablet Take 0.25 mg by mouth at bedtime.   SURE COMFORT PEN NEEDLES 32G X 4 MM MISC Use one test strip to check blood sugars 4 times a day as instructed by the provider.   methocarbamol  (ROBAXIN ) 750 MG tablet Take 1 tablet (750 mg total) by mouth every 8 (eight) hours as needed for muscle spasms.   [DISCONTINUED] methocarbamol  (ROBAXIN ) 750 MG tablet Take 1 tablet (750 mg total) by mouth 2 (two) times daily. (Patient not taking: Reported on 03/26/2024)   [DISCONTINUED] Prucalopride Succinate  (MOTEGRITY ) 2 MG TABS Take 1 tablet (2 mg total) by mouth daily. (Patient not taking: Reported on 03/26/2024)   [DISCONTINUED] Ubrogepant  (UBRELVY ) 100 MG TABS Take 1 tablet (100 mg total) by mouth as needed. (Patient not taking: Reported on 03/26/2024)   No facility-administered encounter medications on file as of 03/26/2024.  Past Medical History:  Diagnosis Date   Anxiety and depression    Bilateral carpal tunnel syndrome    Chronic back pain    Chronic neck pain    Depression    Diabetes mellitus    GERD (gastroesophageal reflux disease)    H/O syncope    Headache    History of panic attacks    Hypertension    IBS (irritable bowel syndrome)    Manic depression (HCC)    Migraine    Panic attack     Past Surgical History:  Procedure Laterality Date   ABDOMINAL HYSTERECTOMY     ABDOMINAL SURGERY     APPENDECTOMY     BIOPSY  09/05/2017   Procedure: BIOPSY;  Surgeon: Alyce Jubilee, MD;   [facility];  Service: Endoscopy;;  random colon   BIOPSY  05/01/2018   Procedure: BIOPSY;  Surgeon: Alyce Jubilee, MD;  [facility];  Service: Endoscopy;;  gastric biopsy   CARPAL TUNNEL RELEASE Bilateral    CHOLECYSTECTOMY     COLONOSCOPY WITH PROPOFOL  N/A 09/05/2017   Procedure: COLONOSCOPY WITH PROPOFOL ;  Surgeon: Alyce Jubilee, MD; normal TI, 5 small polyps, diverticulosis in the cecum, internal and external hemorrhoids.  Random colon biopsies benign.  Colon polyps were hyperplastic.  Repeat in 2023.   CORONARY BALLOON ANGIOPLASTY N/A 06/28/2021   Procedure: CORONARY BALLOON ANGIOPLASTY;  Surgeon: Arnoldo Lapping, MD;  Location: [facility] INVASIVE CV LAB;  Service: Cardiovascular;  Laterality: N/A;   DILATION AND CURETTAGE OF UTERUS     ESOPHAGOGASTRODUODENOSCOPY (EGD) WITH PROPOFOL  N/A 05/01/2018   Procedure: ESOPHAGOGASTRODUODENOSCOPY (EGD) WITH PROPOFOL ;  Surgeon: Alyce Jubilee, MD;  normal esophagus, small hiatal hernia, mild gastritis s/p biopsy, normal duodenum.  No H. pylori.    HERNIA REPAIR     INCISIONAL HERNIA REPAIR N/A 11/06/2020   Procedure: HERNIA REPAIR INCISIONAL, PRIMARY REPAIR;  Surgeon: Awilda Bogus, MD;  Location: [facility];  Service: General;  Laterality: N/A;   LEFT HEART CATH AND CORONARY ANGIOGRAPHY N/A 06/28/2021   Procedure: LEFT HEART CATH AND CORONARY ANGIOGRAPHY;  Surgeon: Arnoldo Lapping, MD;  Location: [facility] INVASIVE CV LAB;  Service: Cardiovascular;  Laterality: N/A;   POLYPECTOMY  09/05/2017   Procedure: POLYPECTOMY;  Surgeon: Alyce Jubilee, MD;  [facility];  Service: Endoscopy;;  sigmoid and rectal   TUBAL LIGATION      Family History  Problem Relation Age of Onset   Diabetes Mother    Stroke Mother    Depression Mother    Lung cancer Mother    Colon cancer Father 42       Passed away from colon cancer   Colon cancer Brother 22       Passed away from colon cancer   Bipolar disorder Son     Social History    Socioeconomic History   Marital status: Legally Separated    Spouse name: Not on file   Number of children: Not on file   Years of education: Not on file   Highest education level: Not on file  Occupational History   Not on file  Tobacco Use   Smoking status: Every Day    Current packs/day: 1.00    Average packs/day: 1 pack/day for 40.0 years (40.0 ttl pk-yrs)    Types: Cigarettes   Smokeless tobacco: Never   Tobacco comments:    one pack a day. Pt has purchased nicotine  patches. She has a plan to stop and is  going to set a quit date.   Vaping Use   Vaping status: Never Used  Substance and Sexual Activity   Alcohol use: No   Drug use: No   Sexual activity: Not Currently    Birth control/protection: Surgical  Other Topics Concern   Not on file  Social History Narrative   Not on file   Social Drivers of Health   Financial Resource Strain: Not on file  Food Insecurity: Low Risk  (01/11/2024)   Received from [facility]   Hunger Vital Sign    Worried About Running Out of Food in the Last Year: Never true    Ran Out of Food in the Last Year: Never true  Transportation Needs: No Transportation Needs (01/11/2024)   Received from [facility]    In the past 12 months, has lack of reliable transportation kept you from medical appointments, meetings, work or from getting things needed for daily living? : No  Physical Activity: Not on file  Stress: Not on file  Social Connections: Not on file  Intimate Partner Violence: Not on file    ROS      Objective    BP 122/76   Pulse 83   Ht 5\' 1"  (1.549 m)   Wt 184 lb 0.6 oz (83.5 kg)   SpO2 91%   BMI 34.77 kg/m   Physical Exam Vitals and nursing note reviewed.  Constitutional:      Appearance: Normal appearance.  Eyes:     Extraocular Movements: Extraocular movements intact.     Pupils: Pupils are equal, round, and reactive to light.  Musculoskeletal:     Lumbar back: Tenderness present.  Decreased range of motion. Positive left straight leg raise test. Negative right straight leg raise test.  Neurological:     Mental Status: She is alert and oriented to person, place, and time.  Psychiatric:        Mood and Affect: Mood normal.        Thought Content: Thought content normal.         Assessment & Plan:   Problem List Items Addressed This Visit       Nervous and Auditory   Back pain of lumbar region with sciatica - Primary   -Followed by spine specialist but has a referral in place to [facility] in [place] for second opinion -Thoracic spine MRI 09/20/23 left T9-10 paracentral disc protrusion w/ mass effecct on left side of thecal sac and mild left foraminal stenosis. -She reports also other chronic lower left-sided back pain that has recently begun to radiate to the right side, extending down both legs to her toes. She described the pain as severe, likening it to a lightning strike, and noted that it has been present for over a year but had recently intensified. -Planning for discetomy but A1C was too high at that time.  It is under better control now that she is under the care of Endo.  Recommend she reach out to neurosurgery now that her blood sugars are under better control. Robaxin  also refilled for pain relief.   Referral to PT placed to see if she can get some improvement in pain control with this.         Relevant Medications   methocarbamol  (ROBAXIN ) 750 MG tablet   Other Relevant Orders   Ambulatory referral to Physical Therapy    No follow-ups on file.   Alison Irvine, FNP

## 2024-04-01 ENCOUNTER — Other Ambulatory Visit: Payer: Self-pay | Admitting: Nurse Practitioner

## 2024-04-01 ENCOUNTER — Telehealth: Payer: Self-pay | Admitting: *Deleted

## 2024-04-01 MED ORDER — FREESTYLE LIBRE 3 PLUS SENSOR MISC: 3 refills | Status: DC

## 2024-04-01 MED ORDER — FREESTYLE LIBRE 3 READER DEVI
1.0000 | Freq: Once | 0 refills | Status: AC
Start: 2024-04-01 — End: 2024-04-01

## 2024-04-01 NOTE — Telephone Encounter (Signed)
 Ok, thanks for the heads up.  Will await readings to help decide what changes need to be made.

## 2024-04-01 NOTE — Telephone Encounter (Signed)
 Patient left a message stating that her blood sugars have been running high , seldom under 200. She is currently injecting Humalog  5-11 units three times a day, Tresiba  30 units at bedtime. She is rotating her sites, she is sticking to her sliding scale, she has not seen any medication running out of the injection site , and she has not been on any antibiotics/steroids.  Patient states that she is down to drinking 3 Coke zero's , no sweet tea, coffee. She is drinking 8-9 bottles of water a day with no additives. She is using the app that Whitney ask her too, to see what foods are goo and which are bad. Eating Kale , and Spinach.  Patient will bring her meter by after lunch for Whitney to review her readings.

## 2024-04-01 NOTE — Telephone Encounter (Signed)
 Patient was given Whitney recommendations. She would like to try the [facility] sensor, a prescription has been sent in to the [facility].

## 2024-04-08 ENCOUNTER — Encounter: Payer: Self-pay | Admitting: Family Medicine

## 2024-04-08 ENCOUNTER — Ambulatory Visit (INDEPENDENT_AMBULATORY_CARE_PROVIDER_SITE_OTHER): Admitting: Family Medicine

## 2024-04-08 ENCOUNTER — Ambulatory Visit: Payer: Self-pay

## 2024-04-08 VITALS — BP 116/70 | HR 102 | Ht 61.0 in | Wt 174.0 lb

## 2024-04-08 DIAGNOSIS — M5440 Lumbago with sciatica, unspecified side: Secondary | ICD-10-CM | POA: Diagnosis not present

## 2024-04-08 MED ORDER — METHOCARBAMOL 750 MG PO TABS
750.0000 mg | ORAL_TABLET | Freq: Three times a day (TID) | ORAL | 0 refills | Status: DC | PRN
Start: 2024-04-08 — End: 2024-04-23

## 2024-04-08 NOTE — Progress Notes (Deleted)
 Annual Wellness Visit     Patient: Katelyn Kelly, Female    DOB: 11/08/1960, 63 y.o.   MRN: [id number]  Subjective  Chief Complaint  Patient presents with   Leg Pain    Leg pain    Back Pain    Back pain     Katelyn Kelly is a 63 y.o. female who presents today for her Annual Wellness Visit. She reports consuming a {diet types:17450} diet. {Exercise:19826} She generally feels {well/fairly well/poorly:18703}. She reports sleeping {well/fairly well/poorly:18703}. She {does/does not:200015} have additional problems to discuss today.   HPI  {VISON DENTAL STD PSA (Optional):27386}   {History (Optional):23778}  Medications: Outpatient Medications Prior to Visit  Medication Sig   ALPRAZolam  (XANAX ) 1 MG tablet Take by mouth.   aspirin  EC 81 MG tablet Take 1 tablet (81 mg total) by mouth daily. Swallow whole.   atorvastatin  (LIPITOR ) 40 MG tablet Take 1 tablet by mouth daily.   busPIRone  (BUSPAR ) 7.5 MG tablet Take by mouth See admin instructions. Take 0.5 tablets (3.75 mg total) by mouth 3 times daily for 7 days, THEN 1 tablet (7.5 mg total) 3 times daily for 7 days, THEN 2 tablets (15 mg total) 3 times daily   Continuous Glucose Sensor (FREESTYLE LIBRE 3 PLUS SENSOR) MISC Change sensor every 15 days.   Fremanezumab -vfrm (AJOVY ) 225 MG/1.5ML SOAJ Inject 225 mg into the skin every 30 (thirty) days.   furosemide  (LASIX ) 20 MG tablet Take 1 tablet (20 mg total) by mouth daily as needed for fluid (Swelling).   glucose blood (ACCU-CHEK GUIDE TEST) test strip Use as instructed to monitor glucose 4 times daily   insulin  degludec (TRESIBA ) 100 UNIT/ML FlexTouch Pen Inject 30 units daily at bedtime as instructed by the provider.   insulin  lispro (HUMALOG ) 100 UNIT/ML KwikPen Patient is to inject 5-11 units per sliding scale as directed by the provider.   ipratropium (ATROVENT  HFA) 17 MCG/ACT inhaler Inhale 2 puffs into the lungs every 6 (six) hours as needed for wheezing.   isosorbide   mononitrate (IMDUR ) 60 MG 24 hr tablet Take 1 tablet (60 mg total) by mouth daily.   linaclotide  (LINZESS ) 290 MCG CAPS capsule Take 290 mcg by mouth daily before breakfast.   Menthol, Topical Analgesic, (BIOFREEZE) 10 % LIQD Apply 1 application topically daily as needed (pain).   methocarbamol  (ROBAXIN ) 750 MG tablet Take 1 tablet (750 mg total) by mouth every 8 (eight) hours as needed for muscle spasms.   metoprolol  tartrate (LOPRESSOR ) 25 MG tablet TAKE (1/2) TABLET BY MOUTH TWICE DAILY   nitroGLYCERIN  (NITROSTAT ) 0.4 MG SL tablet PLACE ONE TABLET UNDER THE TOUNGE EVERY FIVE MINUTES FOR 3 DOSES AS NEEDED FOR CHEST PAINS.   NURTEC 75 MG TBDP TAKE ONE TABLET DAILY AS NEEDED.   ondansetron  (ZOFRAN -ODT) 4 MG disintegrating tablet Take 1 tablet (4 mg total) by mouth every 8 (eight) hours as needed for nausea or vomiting.   pramipexole  (MIRAPEX ) 0.25 MG tablet Take 0.25 mg by mouth at bedtime.   SURE COMFORT PEN NEEDLES 32G X 4 MM MISC Use one test strip to check blood sugars 4 times a day as instructed by the provider.   No facility-administered medications prior to visit.    Allergies  Allergen Reactions   Meprobamate Hives and Swelling   Sinequan [Doxepin Hcl] Anaphylaxis   Soliqua [Insulin  Glargine-Lixisenatide] Other (See Comments)    Flu like symptoms , Shortness of breathe   Tranxene [Clorazepate Dipotassium] Hives and Swelling   Albuterol   Other (See Comments)    Patient does not remember the reaction. This was a record provided via [facility]   Haloperidol Other (See Comments)    Patient does not remember the reaction. This was a record provided via [facility]   Sertraline Other (See Comments)    Patient does not remember the reaction. This was a record provided via [facility]   Vortioxetine Other (See Comments)    Patient does not remember the reaction. This was a record provided via [facility]   Elavil [Amitriptyline] Palpitations    Patient Care  Team: Alison Irvine, FNP as PCP - General (Family Medicine) Amanda Jungling, Joyceann No, MD as PCP - Cardiology (Cardiology)  ROS      Objective  BP 116/70   Pulse (!) 102   Ht 5\' 1"  (1.549 m)   Wt 174 lb (78.9 kg)   SpO2 96%   BMI 32.88 kg/m  {Vitals History (Optional):23777}  Physical Exam    Most recent functional status assessment:     No data to display         Most recent fall risk assessment:    03/26/2024    1:18 PM  Fall Risk   Falls in the past year? 0  Number falls in past yr: 0  Injury with Fall? 0  Risk for fall due to : No Fall Risks  Follow up Falls evaluation completed    Most recent depression screenings:    03/26/2024    1:18 PM 12/16/2021    3:13 PM  PHQ 2/9 Scores  PHQ - 2 Score 6 6  PHQ- 9 Score 17 21   Most recent cognitive screening:     No data to display         Most recent Audit-C alcohol use screening     No data to display         A score of 3 or more in women, and 4 or more in men indicates increased risk for alcohol abuse, EXCEPT if all of the points are from question 1   Vision/Hearing Screen: No results found.  {Labs (Optional):23779}  No results found for any visits on 04/08/24.    Assessment & Plan   Annual wellness visit done today including the all of the following: Reviewed patient's Family Medical History Reviewed and updated list of patient's medical providers Assessment of cognitive impairment was done Assessed patient's functional ability Established a written schedule for health screening services Health Risk Assessent Completed and Reviewed  Exercise Activities and Dietary recommendations  Goals   None     Immunization History  Administered Date(s) Administered   Tdap 07/06/2015    Health Maintenance  Topic Date Due   COVID-19 Vaccine (1) Never done   FOOT EXAM  Never done   OPHTHALMOLOGY EXAM  Never done   Diabetic kidney evaluation - Urine ACR  Never done   Pneumococcal Vaccine 57-41  Years old (1 of 2 - PCV) Never done   Cervical Cancer Screening (HPV/Pap Cotest)  Never done   Zoster Vaccines- Shingrix (2 of 2) 11/25/2020   MAMMOGRAM  08/18/2022   Medicare Annual Wellness (AWV)  04/08/2023   Lung Cancer Screening  03/12/2024   INFLUENZA VACCINE  05/31/2024   HEMOGLOBIN A1C  06/18/2024   Diabetic kidney evaluation - eGFR measurement  08/15/2024   DTaP/Tdap/Td (2 - Td or Tdap) 07/05/2025   Colonoscopy  09/06/2027   Hepatitis C Screening  Completed   HIV Screening  Completed  HPV VACCINES  Aged Out   Meningococcal B Vaccine  Aged Out     Discussed health benefits of physical activity, and encouraged her to engage in regular exercise appropriate for her age and condition.    Problem List Items Addressed This Visit   None   No follow-ups on file.     Marlisa Caridi, FNP

## 2024-04-08 NOTE — Assessment & Plan Note (Signed)
 Robaxin  refilled. Continue taking Oxycodone  5 mg as needed for pain rated greater than 7. Recommended nonpharmacological interventions for back pain, including: Low-impact physical activity (e.g., walking, swimming) Stretching and strengthening exercises (e.g., Cat-Cow, child's pose, knee-to-chest, hamstring stretches) Cold therapy in the first 48 hours; heat therapy thereafter Posture correction and ergonomic support Weight management to reduce spinal strain Proper sleep hygiene with supportive pillows and mattresses

## 2024-04-08 NOTE — Patient Instructions (Signed)
 I appreciate the opportunity to provide care to you today!  -Refilled robaxin  -Continue taking oxycodone  5 mg as needed for pain greater than 7  Here are several nonpharmacological interventions for managing back pain: -Physical Activity: Engage in regular low-impact exercises such as walking, swimming, or cycling to strengthen the back muscles and improve flexibility. -Stretching and Strengthening Exercises: Incorporate exercises that focus on strengthening the core and back muscles, as well as stretches to improve flexibility. Specific exercises can include: Cat-Cow stretch Child's pose Knee-to-chest stretch Hamstring stretches Hot and Cold Therapy: -Cold Therapy: Apply ice packs to the affected area for 15-20 minutes to reduce inflammation and numb the pain, especially within the first 48 hours of an injury. -Heat Therapy: After the initial inflammation has subsided, use heat packs or warm baths to relax tight muscles and improve circulation. -Posture Improvement: Maintain proper posture while sitting, standing, and lifting to avoid additional strain on the back. Ergonomic chairs and lumbar support can help. -Weight Management: Maintain a healthy weight to reduce stress on the spine and lower back. -Sleep Hygiene: Ensure proper sleep positioning by using supportive pillows and mattresses to maintain spinal alignment during sleep.     Referrals today-  Pain management    Please continue to a heart-healthy diet and increase your physical activities. Try to exercise for at least five days a week.    It was a pleasure to see you and I look forward to continuing to work together on your health and well-being. Please do not hesitate to call the office if you need care or have questions about your care.  In case of emergency, please visit the Emergency Department for urgent care, or contact our clinic at [phone] to schedule an appointment. We're here to help you!   Have a  wonderful day and week. With Gratitude, Zachary Lovins MSN, FNP-BC

## 2024-04-08 NOTE — Telephone Encounter (Signed)
    FYI Only or Action Required?: FYI only for provider  Patient was last seen in primary care on 03/26/2024 by Alison Irvine, FNP. Called Nurse Triage reporting Leg Pain. Symptoms began several months ago. Interventions attempted: Prescription medications: for pain. Symptoms are: gradually worsening.  Triage Disposition: No disposition on file.  Patient/caregiver understands and will follow disposition?: Copied from CRM [id number]. Topic: Clinical - Red Word Triage >> Apr 08, 2024  9:01 AM Rosaria Common wrote: Red Word that prompted transfer to Nurse Triage: Pt states that she had a recent appt with Dr. Sharlene Dawn due to sciatica leg pain(both) and was recommended exercises. Pt states the pain has gotten worse and seeking medical advice. Reason for Disposition  [1] SEVERE pain (e.g., excruciating, unable to do any normal activities) AND [2] not improved after 2 hours of pain medicine  Answer Assessment - Initial Assessment Questions 1. ONSET: "When did the pain start?"      On going for a while 2. LOCATION: "Where is the pain located?"      Both legs 3. PAIN: "How bad is the pain?"    (Scale 1-10; or mild, moderate, severe)   -  MILD (1-3): doesn't interfere with normal activities    -  MODERATE (4-7): interferes with normal activities (e.g., work or school) or awakens from sleep, limping    -  SEVERE (8-10): excruciating pain, unable to do any normal activities, unable to walk     Severe; states she was to be set up with Physical therapy 4. WORK OR EXERCISE: "Has there been any recent work or exercise that involved this part of the body?"      denies 5. CAUSE: "What do you think is causing the leg pain?"     Sciatic pain 6. OTHER SYMPTOMS: "Do you have any other symptoms?" (e.g., chest pain, back pain, breathing difficulty, swelling, rash, fever, numbness, weakness)     Exhausted. 7. PREGNANCY: "Is there any chance you are pregnant?" "When was your last menstrual period?"     na  Protocols  used: Leg Pain-A-AH

## 2024-04-08 NOTE — Progress Notes (Signed)
 Acute Office Visit  Subjective:    Patient ID: Katelyn Kelly, female    DOB: 1960/12/31, 63 y.o.   MRN: [id number]  Chief Complaint  Patient presents with   Leg Pain    Leg pain    Back Pain    Back pain     HPI The patient is in today with the above complaints. PDMP was reviewed, and it shows that the patient is currently taking Oxycodone  5 mg, which was most recently refilled on 03/26/2024. She also saw her primary care provider on 03/26/2024, who prescribed Robaxin  750 mg every eight hours and placed a referral to physical therapy. The patient reports that she has not heard back from the physical therapy provider and is currently reluctant to pursue surgery as suggested by a spine specialist. However, she is open to starting physical therapy to help improve her symptoms. She rates her pain as 10 out of 10, which worsens with ambulation. No bowel or bladder incontinence is reported. She denies fever or chills at this time.   Past Medical History:  Diagnosis Date   Anxiety and depression    Bilateral carpal tunnel syndrome    Chronic back pain    Chronic neck pain    Depression    Diabetes mellitus    GERD (gastroesophageal reflux disease)    H/O syncope    Headache    History of panic attacks    Hypertension    IBS (irritable bowel syndrome)    Manic depression (HCC)    Migraine    Panic attack     Past Surgical History:  Procedure Laterality Date   ABDOMINAL HYSTERECTOMY     ABDOMINAL SURGERY     APPENDECTOMY     BIOPSY  09/05/2017   Procedure: BIOPSY;  Surgeon: Alyce Jubilee, MD;  Location: [facility];  Service: Endoscopy;;  random colon   BIOPSY  05/01/2018   Procedure: BIOPSY;  Surgeon: Alyce Jubilee, MD;  Location: [facility];  Service: Endoscopy;;  gastric biopsy   CARPAL TUNNEL RELEASE Bilateral    CHOLECYSTECTOMY     COLONOSCOPY WITH PROPOFOL  N/A 09/05/2017   Procedure: COLONOSCOPY WITH PROPOFOL ;  Surgeon: Alyce Jubilee, MD; normal TI, 5 small  polyps, diverticulosis in the cecum, internal and external hemorrhoids.  Random colon biopsies benign.  Colon polyps were hyperplastic.  Repeat in 2023.   CORONARY BALLOON ANGIOPLASTY N/A 06/28/2021   Procedure: CORONARY BALLOON ANGIOPLASTY;  Surgeon: Arnoldo Lapping, MD;  Location: [facility] INVASIVE CV LAB;  Service: Cardiovascular;  Laterality: N/A;   DILATION AND CURETTAGE OF UTERUS     ESOPHAGOGASTRODUODENOSCOPY (EGD) WITH PROPOFOL  N/A 05/01/2018   Procedure: ESOPHAGOGASTRODUODENOSCOPY (EGD) WITH PROPOFOL ;  Surgeon: Alyce Jubilee, MD;  normal esophagus, small hiatal hernia, mild gastritis s/p biopsy, normal duodenum.  No H. pylori.    HERNIA REPAIR     INCISIONAL HERNIA REPAIR N/A 11/06/2020   Procedure: HERNIA REPAIR INCISIONAL, PRIMARY REPAIR;  Surgeon: Awilda Bogus, MD;  Location: [facility];  Service: General;  Laterality: N/A;   LEFT HEART CATH AND CORONARY ANGIOGRAPHY N/A 06/28/2021   Procedure: LEFT HEART CATH AND CORONARY ANGIOGRAPHY;  Surgeon: Arnoldo Lapping, MD;  Location: [facility] INVASIVE CV LAB;  Service: Cardiovascular;  Laterality: N/A;   POLYPECTOMY  09/05/2017   Procedure: POLYPECTOMY;  Surgeon: Alyce Jubilee, MD;  Location: [facility];  Service: Endoscopy;;  sigmoid and rectal   TUBAL LIGATION      Family History  Problem Relation  Age of Onset   Diabetes Mother    Stroke Mother    Depression Mother    Lung cancer Mother    Colon cancer Father 41       Passed away from colon cancer   Colon cancer Brother 60       Passed away from colon cancer   Bipolar disorder Son     Social History   Socioeconomic History   Marital status: Legally Separated    Spouse name: Not on file   Number of children: Not on file   Years of education: Not on file   Highest education level: Not on file  Occupational History   Not on file  Tobacco Use   Smoking status: Every Day    Current packs/day: 1.00    Average packs/day: 1 pack/day for 40.0 years (40.0 ttl pk-yrs)    Types: Cigarettes    Smokeless tobacco: Never   Tobacco comments:    one pack a day. Pt has purchased nicotine  patches. She has a plan to stop and is going to set a quit date.   Vaping Use   Vaping status: Never Used  Substance and Sexual Activity   Alcohol use: No   Drug use: No   Sexual activity: Not Currently    Birth control/protection: Surgical  Other Topics Concern   Not on file  Social History Narrative   Not on file   Social Drivers of Health   Financial Resource Strain: Not on file  Food Insecurity: Low Risk  (01/11/2024)   Received from [facility]   Hunger Vital Sign    Worried About Running Out of Food in the Last Year: Never true    Ran Out of Food in the Last Year: Never true  Transportation Needs: No Transportation Needs (01/11/2024)   Received from [facility]    In the past 12 months, has lack of reliable transportation kept you from medical appointments, meetings, work or from getting things needed for daily living? : No  Physical Activity: Not on file  Stress: Not on file  Social Connections: Not on file  Intimate Partner Violence: Not on file    Outpatient Medications Prior to Visit  Medication Sig Dispense Refill   ALPRAZolam  (XANAX ) 1 MG tablet Take by mouth.     aspirin  EC 81 MG tablet Take 1 tablet (81 mg total) by mouth daily. Swallow whole.     atorvastatin  (LIPITOR ) 40 MG tablet Take 1 tablet by mouth daily.     busPIRone  (BUSPAR ) 7.5 MG tablet Take by mouth See admin instructions. Take 0.5 tablets (3.75 mg total) by mouth 3 times daily for 7 days, THEN 1 tablet (7.5 mg total) 3 times daily for 7 days, THEN 2 tablets (15 mg total) 3 times daily     Continuous Glucose Sensor (FREESTYLE LIBRE 3 PLUS SENSOR) MISC Change sensor every 15 days. 6 each 3   Fremanezumab -vfrm (AJOVY ) 225 MG/1.5ML SOAJ Inject 225 mg into the skin every 30 (thirty) days. 1.68 mL 11   furosemide  (LASIX ) 20 MG tablet Take 1 tablet (20 mg total) by mouth daily as needed for  fluid (Swelling). 90 tablet 3   glucose blood (ACCU-CHEK GUIDE TEST) test strip Use as instructed to monitor glucose 4 times daily 300 each 12   insulin  degludec (TRESIBA ) 100 UNIT/ML FlexTouch Pen Inject 30 units daily at bedtime as instructed by the provider. 30 mL 0   insulin  lispro (HUMALOG ) 100 UNIT/ML KwikPen Patient  is to inject 5-11 units per sliding scale as directed by the provider. 30 mL 0   ipratropium (ATROVENT  HFA) 17 MCG/ACT inhaler Inhale 2 puffs into the lungs every 6 (six) hours as needed for wheezing. 1 each 0   isosorbide  mononitrate (IMDUR ) 60 MG 24 hr tablet Take 1 tablet (60 mg total) by mouth daily. 90 tablet 3   linaclotide  (LINZESS ) 290 MCG CAPS capsule Take 290 mcg by mouth daily before breakfast.     Menthol, Topical Analgesic, (BIOFREEZE) 10 % LIQD Apply 1 application topically daily as needed (pain).     metoprolol  tartrate (LOPRESSOR ) 25 MG tablet TAKE (1/2) TABLET BY MOUTH TWICE DAILY 90 tablet 3   nitroGLYCERIN  (NITROSTAT ) 0.4 MG SL tablet PLACE ONE TABLET UNDER THE TOUNGE EVERY FIVE MINUTES FOR 3 DOSES AS NEEDED FOR CHEST PAINS. 25 tablet 3   NURTEC 75 MG TBDP TAKE ONE TABLET DAILY AS NEEDED. 8 tablet 6   ondansetron  (ZOFRAN -ODT) 4 MG disintegrating tablet Take 1 tablet (4 mg total) by mouth every 8 (eight) hours as needed for nausea or vomiting. 30 tablet 2   pramipexole  (MIRAPEX ) 0.25 MG tablet Take 0.25 mg by mouth at bedtime.     SURE COMFORT PEN NEEDLES 32G X 4 MM MISC Use one test strip to check blood sugars 4 times a day as instructed by the provider. 200 each 2   methocarbamol  (ROBAXIN ) 750 MG tablet Take 1 tablet (750 mg total) by mouth every 8 (eight) hours as needed for muscle spasms. 30 tablet 0   No facility-administered medications prior to visit.    Allergies  Allergen Reactions   Meprobamate Hives and Swelling   Sinequan [Doxepin Hcl] Anaphylaxis   Soliqua [Insulin  Glargine-Lixisenatide] Other (See Comments)    Flu like symptoms , Shortness of  breathe   Tranxene [Clorazepate Dipotassium] Hives and Swelling   Albuterol  Other (See Comments)    Patient does not remember the reaction. This was a record provided via [facility]   Haloperidol Other (See Comments)    Patient does not remember the reaction. This was a record provided via [facility]   Sertraline Other (See Comments)    Patient does not remember the reaction. This was a record provided via [facility]   Vortioxetine Other (See Comments)    Patient does not remember the reaction. This was a record provided via [facility]   Elavil [Amitriptyline] Palpitations    Review of Systems  Constitutional:  Negative for chills and fever.  Eyes:  Negative for visual disturbance.  Respiratory:  Negative for chest tightness and shortness of breath.   Musculoskeletal:  Positive for back pain.  Neurological:  Negative for dizziness and headaches.       Objective:     Physical Exam HENT:     Head: Normocephalic.     Mouth/Throat:     Mouth: Mucous membranes are moist.  Cardiovascular:     Rate and Rhythm: Normal rate.     Heart sounds: Normal heart sounds.  Pulmonary:     Effort: Pulmonary effort is normal.     Breath sounds: Normal breath sounds.  Musculoskeletal:     Lumbar back: Tenderness present.  Neurological:     Mental Status: She is alert.     BP 116/70   Pulse (!) 102   Ht 5\' 1"  (1.549 m)   Wt 174 lb (78.9 kg)   SpO2 96%   BMI 32.88 kg/m  Wt Readings from Last 3 Encounters:  04/08/24 174 lb (78.9 kg)  03/26/24 184 lb 0.6 oz (83.5 kg)  02/02/24 187 lb (84.8 kg)       Assessment & Plan:  Back pain of lumbar region with sciatica Assessment & Plan: Robaxin  refilled. Continue taking Oxycodone  5 mg as needed for pain rated greater than 7. Recommended nonpharmacological interventions for back pain, including: Low-impact physical activity (e.g., walking, swimming) Stretching and strengthening exercises (e.g., Cat-Cow, child's  pose, knee-to-chest, hamstring stretches) Cold therapy in the first 48 hours; heat therapy thereafter Posture correction and ergonomic support Weight management to reduce spinal strain Proper sleep hygiene with supportive pillows and mattresses   Orders: -     Methocarbamol ; Take 1 tablet (750 mg total) by mouth every 8 (eight) hours as needed for muscle spasms.  Dispense: 30 tablet; Refill: 0 -     Ambulatory referral to Pain Clinic  Note: This chart has been completed using Engineer, civil (consulting) software, and while attempts have been made to ensure accuracy, certain words and phrases may not be transcribed as intended.    Elieser Tetrick, FNP

## 2024-04-09 ENCOUNTER — Telehealth: Payer: Self-pay | Admitting: *Deleted

## 2024-04-09 NOTE — Telephone Encounter (Signed)
 Patient presented to the office today for her [facility] and reader to be set up. The sensor was applied to the back of the patient's right arm. She tolerated this well and verbalized understanding. She did share that she had high anxiety about the sensor being in her arm.

## 2024-04-09 NOTE — Telephone Encounter (Signed)
 Patient has called the office , she is asking if her arm should feel like a IV pinch ? I called the patient, left a message on her voicemail. I told her that I had not heard of it feeling like a IV pinch. Encouraged her to allow it to stay in place. I know that she shared that she had a high anxiety about it being in her arm. She was also advised that if she had any concerns and or questions to call the office back.

## 2024-04-11 ENCOUNTER — Encounter: Payer: Self-pay | Admitting: Physician Assistant

## 2024-04-23 ENCOUNTER — Other Ambulatory Visit: Payer: Self-pay | Admitting: Family Medicine

## 2024-04-23 DIAGNOSIS — M5440 Lumbago with sciatica, unspecified side: Secondary | ICD-10-CM

## 2024-04-26 ENCOUNTER — Other Ambulatory Visit: Payer: Self-pay

## 2024-04-26 ENCOUNTER — Telehealth: Payer: Self-pay

## 2024-04-26 DIAGNOSIS — M5440 Lumbago with sciatica, unspecified side: Secondary | ICD-10-CM

## 2024-04-26 DIAGNOSIS — M5412 Radiculopathy, cervical region: Secondary | ICD-10-CM

## 2024-04-26 MED ORDER — OXYCODONE HCL 5 MG PO CAPS: 5.0000 mg | ORAL_CAPSULE | Freq: Three times a day (TID) | ORAL | 0 refills | Status: DC | PRN

## 2024-04-26 NOTE — Telephone Encounter (Signed)
 Copied from CRM [id number]. Topic: Clinical - Medication Question >> Apr 26, 2024 12:42 PM Marissa P wrote: Reason for CRM: Patient is calling regarding the oxycodone  and pharmacy says they sent a fax, please follow up as soon as possible.

## 2024-04-30 ENCOUNTER — Other Ambulatory Visit: Payer: Self-pay | Admitting: Gastroenterology

## 2024-04-30 NOTE — Telephone Encounter (Signed)
 Recommend ov in 3-4 months for follow up since she is overdue for colonoscopy.

## 2024-05-01 NOTE — Telephone Encounter (Signed)
 Patient has been scheduled

## 2024-05-02 ENCOUNTER — Telehealth: Payer: Self-pay | Admitting: *Deleted

## 2024-05-02 NOTE — Telephone Encounter (Signed)
 No, she is to wait until the next meal to inject the insulin .  Women should shoot for about 12 cups of water per day.  Sometimes more in the heat of the summer if she is sweating out a lot.

## 2024-05-02 NOTE — Telephone Encounter (Signed)
 Patient called and made aware.

## 2024-05-02 NOTE — Telephone Encounter (Signed)
 It looks like she just put on a new sensor yesterday, which could be contributing to some wacky readings.  Last week, her glucose looked really good.  I do not recommend any medication changes just yet, it could be that her sensor is still trying to warm up properly, or that it could be defective.  Have her check a fingerstick to compare as well.

## 2024-05-02 NOTE — Telephone Encounter (Signed)
 Patient left a message. She states that her blood sugars are running high and low.  She is drinking  plenty of water, eating low carbs.  Patient called. She confirms that she is injecting Humalog  5-11 units per sliding scale. There have been sometimes that she has used more than the 11 units. She also injects Tresiba  30 units at bedtime. She has no energy. Someone is bringing her reader by for a download , patient was advised that we would call her back once Whitney addressed it.

## 2024-05-02 NOTE — Telephone Encounter (Signed)
 Patient called and given Whitney's recommendation. She states that she has been sticking her finger and the readings are the same. She is drinking a lot of water and wants to know exactly how much she should drink? Also, she want to know in the mornings when she checks her blood sugar, and it is to low to inject insulin , then she eats, blood sugars go up , is high, is she to take the insulin  then or she is to wait until lunch time and inject then?

## 2024-05-07 ENCOUNTER — Ambulatory Visit: Admitting: Nurse Practitioner

## 2024-05-09 ENCOUNTER — Encounter: Payer: Self-pay | Admitting: Physical Medicine & Rehabilitation

## 2024-06-05 ENCOUNTER — Encounter: Payer: Self-pay | Admitting: Nurse Practitioner

## 2024-06-05 ENCOUNTER — Other Ambulatory Visit: Payer: Self-pay

## 2024-06-05 ENCOUNTER — Ambulatory Visit (INDEPENDENT_AMBULATORY_CARE_PROVIDER_SITE_OTHER): Admitting: Nurse Practitioner

## 2024-06-05 VITALS — BP 102/60 | HR 83 | Ht 61.5 in | Wt 182.2 lb

## 2024-06-05 DIAGNOSIS — E782 Mixed hyperlipidemia: Secondary | ICD-10-CM

## 2024-06-05 DIAGNOSIS — I10 Essential (primary) hypertension: Secondary | ICD-10-CM | POA: Diagnosis not present

## 2024-06-05 DIAGNOSIS — E1165 Type 2 diabetes mellitus with hyperglycemia: Secondary | ICD-10-CM | POA: Diagnosis not present

## 2024-06-05 DIAGNOSIS — Z794 Long term (current) use of insulin: Secondary | ICD-10-CM | POA: Diagnosis not present

## 2024-06-05 DIAGNOSIS — M5440 Lumbago with sciatica, unspecified side: Secondary | ICD-10-CM

## 2024-06-05 LAB — POCT GLYCOSYLATED HEMOGLOBIN (HGB A1C): Hemoglobin A1C: 8.1 % — AB (ref 4.0–5.6)

## 2024-06-05 MED ORDER — INSULIN DEGLUDEC 100 UNIT/ML ~~LOC~~ SOPN: 25.0000 [IU] | PEN_INJECTOR | Freq: Every day | SUBCUTANEOUS | 3 refills | Status: DC

## 2024-06-05 MED ORDER — INSULIN LISPRO (1 UNIT DIAL) 100 UNIT/ML (KWIKPEN)
5.0000 [IU] | PEN_INJECTOR | Freq: Three times a day (TID) | SUBCUTANEOUS | 3 refills | Status: DC
Start: 2024-06-05 — End: 2024-09-10

## 2024-06-05 NOTE — Progress Notes (Signed)
 Endocrinology Follow Up Note       06/05/2024, 11:22 AM   Subjective:    Patient ID: Katelyn Kelly, female    DOB: [birth year].  Katelyn Kelly is being seen in follow up after being seen in consultation for management of currently uncontrolled symptomatic diabetes requested by  [name], FNP.   Past Medical History:  Diagnosis Date   Anxiety and depression    Bilateral carpal tunnel syndrome    Chronic back pain    Chronic neck pain    Depression    Diabetes mellitus    GERD (gastroesophageal reflux disease)    H/O syncope    Headache    History of panic attacks    Hypertension    IBS (irritable bowel syndrome)    Manic depression (HCC)    Migraine    Panic attack     Past Surgical History:  Procedure Laterality Date   ABDOMINAL HYSTERECTOMY     ABDOMINAL SURGERY     APPENDECTOMY     BIOPSY  09/05/2017   Procedure: BIOPSY;  Surgeon: Harvey Margo CROME, MD;  Location: [facility];  Service: Endoscopy;;  random colon   BIOPSY  05/01/2018   Procedure: BIOPSY;  Surgeon: Harvey Margo CROME, MD;  Location: [facility];  Service: Endoscopy;;  gastric biopsy   CARPAL TUNNEL RELEASE Bilateral    CHOLECYSTECTOMY     COLONOSCOPY WITH PROPOFOL  N/A 09/05/2017   Procedure: COLONOSCOPY WITH PROPOFOL ;  Surgeon: Harvey Margo CROME, MD; normal TI, 5 small polyps, diverticulosis in the cecum, internal and external hemorrhoids.  Random colon biopsies benign.  Colon polyps were hyperplastic.  Repeat in 2023.   CORONARY BALLOON ANGIOPLASTY N/A 06/28/2021   Procedure: CORONARY BALLOON ANGIOPLASTY;  Surgeon: Wonda Sharper, MD;  Location: [facility] INVASIVE CV LAB;  Service: Cardiovascular;  Laterality: N/A;   DILATION AND CURETTAGE OF UTERUS     ESOPHAGOGASTRODUODENOSCOPY (EGD) WITH PROPOFOL  N/A 05/01/2018   Procedure: ESOPHAGOGASTRODUODENOSCOPY (EGD) WITH PROPOFOL ;  Surgeon: Harvey Margo CROME, MD;  normal esophagus, small hiatal  hernia, mild gastritis s/p biopsy, normal duodenum.  No H. pylori.    HERNIA REPAIR     INCISIONAL HERNIA REPAIR N/A 11/06/2020   Procedure: HERNIA REPAIR INCISIONAL, PRIMARY REPAIR;  Surgeon: Kallie Manuelita BROCKS, MD;  Location: [facility];  Service: General;  Laterality: N/A;   LEFT HEART CATH AND CORONARY ANGIOGRAPHY N/A 06/28/2021   Procedure: LEFT HEART CATH AND CORONARY ANGIOGRAPHY;  Surgeon: Wonda Sharper, MD;  Location: [facility] INVASIVE CV LAB;  Service: Cardiovascular;  Laterality: N/A;   POLYPECTOMY  09/05/2017   Procedure: POLYPECTOMY;  Surgeon: Harvey Margo CROME, MD;  Location: [facility];  Service: Endoscopy;;  sigmoid and rectal   TUBAL LIGATION      Social History   Socioeconomic History   Marital status: Legally Separated    Spouse name: Not on file   Number of children: Not on file   Years of education: Not on file   Highest education level: Not on file  Occupational History   Not on file  Tobacco Use   Smoking status: Every Day    Current packs/day: 1.00    Average packs/day: 1 pack/day  for 40.0 years (40.0 ttl pk-yrs)    Types: Cigarettes   Smokeless tobacco: Never   Tobacco comments:    one pack a day. Pt has purchased nicotine  patches. She has a plan to stop and is going to set a quit date.   Vaping Use   Vaping status: Never Used  Substance and Sexual Activity   Alcohol use: No   Drug use: No   Sexual activity: Not Currently    Birth control/protection: Surgical  Other Topics Concern   Not on file  Social History Narrative   Not on file   Social Drivers of Health   Financial Resource Strain: Not on file  Food Insecurity: Low Risk  (01/11/2024)   Received from [facility]   Hunger Vital Sign    Within the past 12 months, you worried that your food would run out before you got money to buy more: Never true    Within the past 12 months, the food you bought just didn't last and you didn't have money to get more. : Never true  Transportation Needs: No  Transportation Needs (01/11/2024)   Received from [facility]    In the past 12 months, has lack of reliable transportation kept you from medical appointments, meetings, work or from getting things needed for daily living? : No  Physical Activity: Not on file  Stress: Not on file  Social Connections: Not on file    Family History  Problem Relation Age of Onset   Diabetes Mother    Stroke Mother    Depression Mother    Lung cancer Mother    Colon cancer Father 69       Passed away from colon cancer   Colon cancer Brother 42       Passed away from colon cancer   Bipolar disorder Son     Outpatient Encounter Medications as of 06/05/2024  Medication Sig   aspirin  EC 81 MG tablet Take 1 tablet (81 mg total) by mouth daily. Swallow whole.   atorvastatin  (LIPITOR ) 40 MG tablet Take 1 tablet by mouth daily.   busPIRone  (BUSPAR ) 7.5 MG tablet Take by mouth See admin instructions. Take 0.5 tablets (3.75 mg total) by mouth 3 times daily for 7 days, THEN 1 tablet (7.5 mg total) 3 times daily for 7 days, THEN 2 tablets (15 mg total) 3 times daily   Continuous Glucose Sensor (FREESTYLE LIBRE 3 PLUS SENSOR) MISC Change sensor every 15 days.   Fremanezumab -vfrm (AJOVY ) 225 MG/1.5ML SOAJ Inject 225 mg into the skin every 30 (thirty) days.   furosemide  (LASIX ) 20 MG tablet Take 1 tablet (20 mg total) by mouth daily as needed for fluid (Swelling). (Patient taking differently: Take 20 mg by mouth daily.)   glucose blood (ACCU-CHEK GUIDE TEST) test strip Use as instructed to monitor glucose 4 times daily   ipratropium (ATROVENT  HFA) 17 MCG/ACT inhaler Inhale 2 puffs into the lungs every 6 (six) hours as needed for wheezing.   isosorbide  mononitrate (IMDUR ) 60 MG 24 hr tablet Take 1 tablet (60 mg total) by mouth daily.   LINZESS  290 MCG CAPS capsule Take 1 capsule (290 mcg total) by mouth daily before breakfast.   Menthol, Topical Analgesic, (BIOFREEZE) 10 % LIQD Apply 1 application  topically daily as needed (pain).   methocarbamol  (ROBAXIN ) 750 MG tablet Take 1 tablet (750 mg total) by mouth every 8 (eight) hours as needed for muscle spasms.   metoprolol  tartrate (LOPRESSOR ) 25 MG  tablet TAKE (1/2) TABLET BY MOUTH TWICE DAILY   nitroGLYCERIN  (NITROSTAT ) 0.4 MG SL tablet PLACE ONE TABLET UNDER THE TOUNGE EVERY FIVE MINUTES FOR 3 DOSES AS NEEDED FOR CHEST PAINS.   NURTEC 75 MG TBDP TAKE ONE TABLET DAILY AS NEEDED.   ondansetron  (ZOFRAN -ODT) 4 MG disintegrating tablet Take 1 tablet (4 mg total) by mouth every 8 (eight) hours as needed for nausea or vomiting.   pramipexole  (MIRAPEX ) 0.25 MG tablet Take 0.25 mg by mouth at bedtime.   SURE COMFORT PEN NEEDLES 32G X 4 MM MISC Use one test strip to check blood sugars 4 times a day as instructed by the provider.   [DISCONTINUED] insulin  degludec (TRESIBA ) 100 UNIT/ML FlexTouch Pen Inject 30 units daily at bedtime as instructed by the provider.   [DISCONTINUED] insulin  lispro (HUMALOG ) 100 UNIT/ML KwikPen Patient is to inject 5-11 units per sliding scale as directed by the provider.   insulin  degludec (TRESIBA ) 100 UNIT/ML FlexTouch Pen Inject 25 Units into the skin at bedtime. Inject 30 units daily at bedtime as instructed by the provider.   insulin  lispro (HUMALOG ) 100 UNIT/ML KwikPen Inject 5-11 Units into the skin 3 (three) times daily. Patient is to inject 5-11 units per sliding scale as directed by the provider.   oxycodone  (OXY-IR) 5 MG capsule Take 1 capsule (5 mg total) by mouth every 8 (eight) hours as needed for pain. (Patient not taking: Reported on 06/05/2024)   No facility-administered encounter medications on file as of 06/05/2024.    ALLERGIES: Allergies  Allergen Reactions   Meprobamate Hives and Swelling   Sinequan [Doxepin Hcl] Anaphylaxis   Soliqua [Insulin  Glargine-Lixisenatide] Other (See Comments)    Flu like symptoms , Shortness of breathe   Tranxene [Clorazepate Dipotassium] Hives and Swelling   Albuterol   Other (See Comments)    Patient does not remember the reaction. This was a record provided via [facility]   Haloperidol Other (See Comments)    Patient does not remember the reaction. This was a record provided via [facility]   Sertraline Other (See Comments)    Patient does not remember the reaction. This was a record provided via [facility]   Vortioxetine Other (See Comments)    Patient does not remember the reaction. This was a record provided via [facility]   Elavil [Amitriptyline] Palpitations    VACCINATION STATUS: Immunization History  Administered Date(s) Administered   Tdap 07/06/2015    Diabetes She presents for her follow-up diabetic visit. She has type 2 diabetes mellitus. Onset time: diagnosed at approx age of 55. Her disease course has been fluctuating. There are no hypoglycemic associated symptoms. There are no diabetic associated symptoms. There are no hypoglycemic complications. Symptoms are stable. 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 Risk factors for coronary artery disease include diabetes mellitus, dyslipidemia, family history, hypertension, tobacco exposure and post-menopausal. Current diabetic treatment includes intensive insulin  program. She is compliant with treatment most of the time. Her weight is fluctuating minimally. She is following a generally unhealthy diet. When asked about meal planning, she reported none. She has not had a previous visit with a dietitian. She participates in exercise intermittently. Her home blood glucose trend is fluctuating minimally. Her overall blood glucose range is 140-180 mg/dl. (She presents today with her CGM showing mostly at target glycemic profile overall.  Her POCT A1c today is 8.1%, increasing from last visit of 7.7%.  She does note she has been under more stress recently.  Analysis of her CGM shows TIR 63%, TYAR 37%, TBR 0% with a GMI of 7.4%.   She does get alarms for lows when she approaches the 70 range, for which she does have a tendency to over-correct.) An ACE inhibitor/angiotensin II receptor blocker is not being taken. She does not see a podiatrist.Eye exam is current.     Review of systems  Constitutional: + Minimally fluctuating body weight, current Body mass index is 33.87 kg/m., no fatigue, no subjective hyperthermia, no subjective hypothermia Eyes: no blurry vision, no xerophthalmia ENT: no sore throat, no nodules palpated in throat, no dysphagia/odynophagia, no hoarseness Cardiovascular: no chest pain, no shortness of breath, no palpitations, + intermittent leg swelling Respiratory: no cough, no shortness of breath Gastrointestinal: no nausea/vomiting/diarrhea Musculoskeletal: chronic back pain-seeing pain management soon Skin: no rashes, no hyperemia Neurological: no tremors, no numbness, no tingling, no dizziness Psychiatric: no depression, no anxiety  Objective:     BP 102/60 (BP Location: Right Arm, Patient Position: Sitting, Cuff Size: Large)   Pulse 83   Ht 5' 1.5 (1.562 m)   Wt 182 lb 3.2 oz (82.6 kg)   BMI 33.87 kg/m   Wt Readings from Last 3 Encounters:  06/05/24 182 lb 3.2 oz (82.6 kg)  04/08/24 174 lb (78.9 kg)  03/26/24 184 lb 0.6 oz (83.5 kg)     BP Readings from Last 3 Encounters:  06/05/24 102/60  04/08/24 116/70  03/26/24 122/76      Physical Exam- Limited  Constitutional:  Body mass index is 33.87 kg/m. , not in acute distress, normal state of mind Eyes:  EOMI, no exophthalmos Musculoskeletal: no gross deformities, strength intact in all four extremities, no gross restriction of joint movements Skin:  no rashes, no hyperemia Neurological: no tremor with outstretched hands   Diabetic Foot Exam - Simple   No data filed      CMP ( most recent) CMP     Component Value Date/Time   NA 138 08/16/2023 0834   K 4.2 08/16/2023 0834   CL 105 08/16/2023 0834   CO2 23  08/16/2023 0834   GLUCOSE 157 (H) 08/16/2023 0834   BUN 18 08/16/2023 0834   CREATININE 1.08 (H) 08/16/2023 0834   CALCIUM  8.5 (L) 08/16/2023 0834   PROT 7.3 02/05/2024 0826   ALBUMIN 3.7 02/05/2024 0826   AST 21 02/05/2024 0826   ALT 30 02/05/2024 0826   ALKPHOS 91 02/05/2024 0826   BILITOT 0.5 02/05/2024 0826   GFRNONAA 58 (L) 08/16/2023 0834     Diabetic Labs (most recent): Lab Results  Component Value Date   HGBA1C 8.1 (A) 06/05/2024   HGBA1C 7.7 12/20/2023   HGBA1C 10.4 (H) 06/26/2021     Lipid Panel ( most recent) Lipid Panel     Component Value Date/Time   CHOL 119 02/05/2024 0824   TRIG 64 02/05/2024 0824   HDL 60 02/05/2024 0824   CHOLHDL 2.0 02/05/2024 0824   VLDL 13 02/05/2024 0824   LDLCALC 46 02/05/2024 0824      Lab Results  Component Value Date   TSH 1.518 06/26/2021           Assessment & Plan:   1) Type 2 diabetes mellitus with hyperglycemia, with long-term current use of insulin  (HCC) (Primary)  She presents today with her CGM showing mostly at target glycemic profile overall.  Her POCT A1c today is 8.1%, increasing from last visit of 7.7%.  She does note she has been under more stress recently.  Analysis of her CGM shows TIR 63%, TYAR 37%, TBR 0% with a GMI of 7.4%.  She does get alarms for lows when she approaches the 70 range, for which she does have a tendency to over-correct.  - Katelyn Kelly has currently uncontrolled symptomatic type 2 DM since 63 years of age.   -Recent labs reviewed.  - I had a long discussion with her about the progressive nature of diabetes and the pathology behind its complications. 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 These are all discussed in detail with her.  The following Lifestyle Medicine recommendations according to American College of Lifestyle Medicine  [facility]) were discussed and offered to patient and she agrees to start the journey:  A. Whole Foods, Plant-based plate comprising of fruits and vegetables, plant-based proteins, whole-grain carbohydrates was discussed in detail with the patient.   A list for source of those nutrients were also provided to the patient.  Patient will use only water or unsweetened tea for hydration. B.  The need to stay away from risky substances including alcohol, smoking; obtaining 7 to 9 hours of restorative sleep, at least 150 minutes of moderate intensity exercise weekly, the importance of healthy social connections,  and stress reduction techniques were discussed. C.  A full color page of  Calorie density of various food groups per pound showing examples of each food groups was provided to the patient.  - Nutritional counseling repeated at each appointment due to patients tendency to fall back in to old habits.  - The patient admits there is a room for improvement in their diet and drink choices. -  Suggestion is made for the patient to avoid simple carbohydrates from their diet including Cakes, Sweet Desserts / Pastries, Ice Cream, Soda (diet and regular), Sweet Tea, Candies, Chips, Cookies, Sweet Pastries, Store Bought Juices, Alcohol in Excess of 1-2 drinks a day, Artificial Sweeteners, Coffee Creamer, and Sugar-free Products. This will help patient to have stable blood glucose profile and potentially avoid unintended weight gain.   - I encouraged the patient to switch to unprocessed or minimally processed complex starch and increased protein intake (animal or plant source), fruits, and vegetables.   - Patient is advised to stick to a routine mealtimes to eat 3 meals a day and avoid unnecessary snacks (to snack only to correct hypoglycemia).  - I have approached her with the following individualized plan to manage her diabetes and patient agrees:   - he is advised to lower her Tresiba  to 25 units SQ nightly and  continue Lispro to 5-11 units TID with meals if glucose is above 90 and she is eating (Specific instructions on how to titrate insulin  dosage based on glucose readings given to patient in writing).    -she is encouraged to start/continue monitoring glucose 4 times daily (using her CGM), before meals and before bed, and to call the clinic if she has readings less than 70 or above 300 for 3 tests in a row.  - she is warned not to take insulin  without proper monitoring per orders. - Adjustment parameters are given to her for hypo and hyperglycemia in writing.  - she is not an ideal candidate for incretin therapy given heavy smoking history which increases her risk of pancreatitis with the GLP1 class.  She has tried this in the past and developed severe  constipation.  She has also tried Jardiance  in the past but did not tolerate it due to yeast formation.  Januvia, Glipizide, and Metformin were all tried in the past as well but stopped due to nausea.  - Specific targets for  A1c; LDL, HDL, and Triglycerides were discussed with the patient.  2) Blood Pressure /Hypertension:  her blood pressure is controlled to target.   she is advised to continue her current medications as prescribed by her PCP.  3) Lipids/Hyperlipidemia:    Review of her recent lipid panel from 02/05/24 showed controlled LDL at 46.  she is advised to continue Lipitor  80 mg daily at bedtime.  Side effects and precautions discussed with her.  4)  Weight/Diet:  her Body mass index is 33.87 kg/m.  -  clearly complicating her diabetes care.   she is a candidate for weight loss. I discussed with her the fact that loss of 5 - 10% of her  current body weight will have the most impact on her diabetes management.  Exercise, and detailed carbohydrates information provided  -  detailed on discharge instructions.  5) Chronic Care/Health Maintenance: -she is not on ACEI/ARB and is on Statin medications and is encouraged to initiate and continue  to follow up with Ophthalmology, Dentist, Podiatrist at least yearly or according to recommendations, and advised to QUIT SMOKING (already in the process of quitting). I have recommended yearly flu vaccine and pneumonia vaccine at least every 5 years; moderate intensity exercise for up to 150 minutes weekly; and sleep for at least 7 hours a day.  - she is advised to maintain close follow up with [name], FNP for primary care needs, as well as her other providers for optimal and coordinated care.     I spent  35  minutes in the care of the patient today including review of labs from CMP, Lipids, Thyroid Function, Hematology (current and previous including abstractions from other facilities); face-to-face time discussing  her blood glucose readings/logs, discussing hypoglycemia and hyperglycemia episodes and symptoms, medications doses, her options of short and long term treatment based on the latest standards of care / guidelines;  discussion about incorporating lifestyle medicine;  and documenting the encounter. Risk reduction counseling performed per USPSTF guidelines to reduce obesity and cardiovascular risk factors.     Please refer to Patient Instructions for Blood Glucose Monitoring and Insulin /Medications Dosing Guide  in media tab for additional information. Please  also refer to  Patient Self Inventory in the Media  tab for reviewed elements of pertinent patient history.  Katelyn Kelly participated in the discussions, expressed understanding, and voiced agreement with the above plans.  All questions were answered to her satisfaction. she is encouraged to contact clinic should she have any questions or concerns prior to her return visit.     Follow up plan: - Return in about 3 months (around 09/05/2024) for Diabetes F/U with A1c in office, No previsit labs, Bring meter and logs.   Benton Rio, [facility] [address] [place],  [place] [address] Phone: [phone] Fax: [phone]  06/05/2024, 11:22 AM

## 2024-06-07 ENCOUNTER — Encounter: Attending: Physical Medicine & Rehabilitation | Admitting: Physical Medicine & Rehabilitation

## 2024-06-07 ENCOUNTER — Encounter: Payer: Self-pay | Admitting: Physical Medicine & Rehabilitation

## 2024-06-07 VITALS — BP 120/75 | HR 81 | Ht 61.5 in | Wt 182.0 lb

## 2024-06-07 DIAGNOSIS — M5440 Lumbago with sciatica, unspecified side: Secondary | ICD-10-CM | POA: Diagnosis present

## 2024-06-07 MED ORDER — METHOCARBAMOL 750 MG PO TABS: 750.0000 mg | ORAL_TABLET | Freq: Every day | ORAL | 2 refills | Status: DC

## 2024-06-07 NOTE — Progress Notes (Signed)
 Subjective:    Patient ID: Katelyn Kelly, female    DOB: August 04, 1961, 63 y.o.   MRN: [id number]  HPI  63 yo female with hx of severe arthritis and anxiety referred for chronic low back pain radiating into the lower extremities.  The patient states the onset was approximately 1 year ago.  She does not note any traumatic event.  Her pain is in a moderate range the back pain tends to be a little bit more left side and the lower extremity pain is bilaterally she can walk without assistive device she can climb steps she drives she is independent with all his household duties she has disability for mental health reasons. In terms of self management of her pain she has tried heat as well as ice as well as Biofreeze as well as Salonpas patches as well as ibuprofen  4 tablets/day.  She does not exercise.  She is a smoker  The patient has been seen by neurosurgery Dr. Lindalee recommended decompression surgery.  She states that due to her anxiety she wishes to avoid this. She has not tried physical therapy  Has not tried low back injections  PHQ-9 score is 22 She does see her primary care who prescribes her psychiatric medications. She states she does not have a mental health provider Reviewed MRI of the lumbar and thoracic spine dated 10/07/2023, actual images reviewed. The patient has no progressive weakness of lower extremities No bowel or bladder dysfunction other than some constipation that improved after starting Linzess  Pain Inventory Average Pain 8 Pain Right Now 6 My pain is intermittent, constant, sharp, burning, tingling, and aching  In the last 24 hours, has pain interfered with the following? General activity 10 Relation with others 4 Enjoyment of life 10 What TIME of day is your pain at its worst? morning  and night Sleep (in general) Poor  Pain is worse with: walking, bending, inactivity, and standing Pain improves with: rest Relief from Meds: a little  Mobility Climbs  steps & drive  Function Disable for maybe 8 years  Neuro/PSYCH  Weakness, tingling, spasms, depression, anxiety   Family History  Problem Relation Age of Onset   Diabetes Mother    Stroke Mother    Depression Mother    Lung cancer Mother    Colon cancer Father 54       Passed away from colon cancer   Colon cancer Brother 20       Passed away from colon cancer   Bipolar disorder Son    Social History   Socioeconomic History   Marital status: Legally Separated    Spouse name: Not on file   Number of children: Not on file   Years of education: Not on file   Highest education level: Not on file  Occupational History   Not on file  Tobacco Use   Smoking status: Every Day    Current packs/day: 1.00    Average packs/day: 1 pack/day for 40.0 years (40.0 ttl pk-yrs)    Types: Cigarettes   Smokeless tobacco: Never   Tobacco comments:    one pack a day. Pt has purchased nicotine  patches. She has a plan to stop and is going to set a quit date.   Vaping Use   Vaping status: Never Used  Substance and Sexual Activity   Alcohol use: No   Drug use: No   Sexual activity: Not Currently    Birth control/protection: Surgical  Other Topics Concern   Not on  file  Social History Narrative   Not on file   Social Drivers of Health   Financial Resource Strain: Not on file  Food Insecurity: Low Risk  (01/11/2024)   Received from [facility]   Hunger Vital Sign    Within the past 12 months, you worried that your food would run out before you got money to buy more: Never true    Within the past 12 months, the food you bought just didn't last and you didn't have money to get more. : Never true  Transportation Needs: No Transportation Needs (01/11/2024)   Received from [facility]    In the past 12 months, has lack of reliable transportation kept you from medical appointments, meetings, work or from getting things needed for daily living? : No  Physical Activity:  Not on file  Stress: Not on file  Social Connections: Not on file   Past Surgical History:  Procedure Laterality Date   ABDOMINAL HYSTERECTOMY     ABDOMINAL SURGERY     APPENDECTOMY     BIOPSY  09/05/2017   Procedure: BIOPSY;  Surgeon: Harvey Margo CROME, MD;  Location: [facility];  Service: Endoscopy;;  random colon   BIOPSY  05/01/2018   Procedure: BIOPSY;  Surgeon: Harvey Margo CROME, MD;  Location: [facility];  Service: Endoscopy;;  gastric biopsy   CARPAL TUNNEL RELEASE Bilateral    CHOLECYSTECTOMY     COLONOSCOPY WITH PROPOFOL  N/A 09/05/2017   Procedure: COLONOSCOPY WITH PROPOFOL ;  Surgeon: Harvey Margo CROME, MD; normal TI, 5 small polyps, diverticulosis in the cecum, internal and external hemorrhoids.  Random colon biopsies benign.  Colon polyps were hyperplastic.  Repeat in 2023.   CORONARY BALLOON ANGIOPLASTY N/A 06/28/2021   Procedure: CORONARY BALLOON ANGIOPLASTY;  Surgeon: Wonda Sharper, MD;  Location: [facility] INVAS[facility] CV LAB;  Service: Cardiovascular;  Laterality: N/A;   DILATION AND CURETTAGE OF UTERUS     ESOPHAGOGASTRODUODENOSCOPY (EGD) WITH PROPOFOL  N/A 05/01/2018   Procedure: ESOPHAGOGASTRODUODENOSCOPY (EGD) WITH PROPOFOL ;  Surgeon: Harvey Margo CROME, MD;  normal esophagus, small hiatal hernia, mild gastritis s/p biopsy, normal duodenum.  No H. pylori.    HERNIA REPAIR     INCISIONAL HERNIA REPAIR N/A 11/06/2020   Procedure: HERNIA REPAIR INCISIONAL, PRIMARY REPAIR;  Surgeon: Kallie Manuelita BROCKS, MD;  Location: [facility];  Service: General;  Laterality: N/A;   LEFT HEART CATH AND CORONARY ANGIOGRAPHY N/A 06/28/2021   Procedure: LEFT HEART CATH AND CORONARY ANGIOGRAPHY;  Surgeon: Wonda Sharper, MD;  Location: [facility] INVASIVE CV LAB;  Service: Cardiovascular;  Laterality: N/A;   POLYPECTOMY  09/05/2017   Procedure: POLYPECTOMY;  Surgeon: Harvey Margo CROME, MD;  [facility];  Service: Endoscopy;;  sigmoid and rectal   TUBAL LIGATION     Past Surgical History:  Procedure Laterality  Date   ABDOMINAL HYSTERECTOMY     ABDOMINAL SURGERY     APPENDECTOMY     BIOPSY  09/05/2017   Procedure: BIOPSY;  Surgeon: Harvey Margo CROME, MD;  [facility]: [facility];  Service: Endoscopy;;  random colon   BIOPSY  05/01/2018   Procedure: BIOPSY;  Surgeon: Harvey Margo CROME, MD;  [facility]: [facility];  Service: Endoscopy;;  gastric biopsy   CARPAL TUNNEL RELEASE Bilateral    CHOLECYSTECTOMY     COLONOSCOPY WITH PROPOFOL  N/A 09/05/2017   Procedure: COLONOSCOPY WITH PROPOFOL ;  Surgeon: Harvey Margo CROME, MD; normal TI, 5 small polyps, diverticulosis in the cecum, internal and external hemorrhoids.  Random colon biopsies benign.  Colon polyps were hyperplastic.  Repeat in 2023.   CORONARY BALLOON ANGIOPLASTY N/A 06/28/2021   Procedure: CORONARY BALLOON ANGIOPLASTY;  Surgeon: Wonda Sharper, MD;  Location: [facility] INVASIVE CV LAB;  Service: Cardiovascular;  Laterality: N/A;   DILATION AND CURETTAGE OF UTERUS     ESOPHAGOGASTRODUODENOSCOPY (EGD) WITH PROPOFOL  N/A 05/01/2018   Procedure: ESOPHAGOGASTRODUODENOSCOPY (EGD) WITH PROPOFOL ;  Surgeon: Harvey Margo CROME, MD;  normal esophagus, small hiatal hernia, mild gastritis s/p biopsy, normal duodenum.  No H. pylori.    HERNIA REPAIR     INCISIONAL HERNIA REPAIR N/A 11/06/2020   Procedure: HERNIA REPAIR INCISIONAL, PRIMARY REPAIR;  Surgeon: Kallie Manuelita BROCKS, MD;  Location: [facility];  Service: General;  Laterality: N/A;   LEFT HEART CATH AND CORONARY ANGIOGRAPHY N/A 06/28/2021   Procedure: LEFT HEART CATH AND CORONARY ANGIOGRAPHY;  Surgeon: Wonda Sharper, MD;  Location: [facility] INVAS[facility] CV LAB;  Service: Cardiovascular;  Laterality: N/A;   POLYPECTOMY  09/05/2017   Procedure: POLYPECTOMY;  Surgeon: Harvey Margo CROME, MD;  Location: [facility];  Service: Endoscopy;;  sigmoid and rectal   TUBAL LIGATION     Past Medical History:  Diagnosis Date   Anxiety and depression    Bilateral carpal tunnel syndrome    Chronic back pain    Chronic neck pain    Depression     Diabetes mellitus    GERD (gastroesophageal reflux disease)    H/O syncope    Headache    History of panic attacks    Hypertension    IBS (irritable bowel syndrome)    Manic depression (HCC)    Migraine    Panic attack    Ht 5' 1.5 (1.562 m)   Wt 182 lb (82.6 kg)   BMI 33.83 kg/m   Opioid Risk Score:   Fall Risk Score:  `1  Depression screen PHQ 2/9     03/26/2024    1:18 PM 12/16/2021    3:13 PM 09/16/2021    1:09 PM  Depression screen PHQ 2/9  Decreased Interest 3 3 3   Down, Depressed, Hopeless 3 3 3   PHQ - 2 Score 6 6 6   Altered sleeping 3 3 2   Tired, decreased energy 3 3 3   Change in appetite 0 3 3  Feeling bad or failure about yourself  3 3 3   Trouble concentrating 2 3 2   Moving slowly or fidgety/restless 0 0 0  Suicidal thoughts 0 0 0  PHQ-9 Score 17 21 19   Difficult doing work/chores Somewhat difficult  Very difficult    Review of Systems  Constitutional:        Weight gain  Respiratory:  Positive for shortness of breath and wheezing.   Cardiovascular:  Positive for leg swelling.       Feet & ankle swelling  Gastrointestinal:  Positive for abdominal pain and constipation.  Musculoskeletal:        Spasms  Neurological:  Positive for weakness and headaches.       Tingling  Psychiatric/Behavioral:         Depression, anxiety  All other systems reviewed and are negative.      Objective:   Physical Exam General No acute distress Mood affect appropriate  The patient has decreased range of motion in both hips internal/external rotation as well as her lumbar spine as below  Reduced pp sensation Right L4-5-S1 dermatomes   MMT 5/5 BLE bilateral hip flexors knee extensors ankle dorsiflexors plantar flexors   Lateral compression:  Negative FABER's: Positive right sacroiliac Distraction (supine): Negative Thigh thrust test: Negative  Lumb flex ext 25% , flex hurts more than ext Ambulates without assistive device no evidence of toe drag or knee  stability      Assessment & Plan:  1.  Lumbar spinal stenosis primarily at L3-4 L4-5 with chronic lower extremity sciatic pain which is likely superimposed on some diabetic neuropathy, chronically elevated blood sugars. We discussed that treatments other than surgery are focused on symptom management rather than relief of spinal nerve root compression. She does have reduced range of motion and mobility that is slowly worsening.  Would recommend physical therapy.  I will see her back in 1 month.  We may discuss some injections which could focus either on back pain with medial branch blocks or side pain with epidural injections.  Poor sleep at night I have recommended methocarbamol  750 to 1500 mg nightly I reviewed her home exercise program and recommended not to do the extension exercises

## 2024-06-12 ENCOUNTER — Other Ambulatory Visit: Payer: Self-pay | Admitting: Cardiology

## 2024-06-13 ENCOUNTER — Encounter (HOSPITAL_COMMUNITY): Payer: Self-pay

## 2024-06-13 ENCOUNTER — Other Ambulatory Visit: Payer: Self-pay

## 2024-06-13 ENCOUNTER — Ambulatory Visit (HOSPITAL_COMMUNITY): Attending: Physical Medicine & Rehabilitation

## 2024-06-13 DIAGNOSIS — Z7409 Other reduced mobility: Secondary | ICD-10-CM | POA: Diagnosis present

## 2024-06-13 DIAGNOSIS — R29898 Other symptoms and signs involving the musculoskeletal system: Secondary | ICD-10-CM | POA: Insufficient documentation

## 2024-06-13 DIAGNOSIS — M5440 Lumbago with sciatica, unspecified side: Secondary | ICD-10-CM | POA: Diagnosis not present

## 2024-06-13 DIAGNOSIS — M5459 Other low back pain: Secondary | ICD-10-CM | POA: Diagnosis present

## 2024-06-13 NOTE — Therapy (Signed)
 OUTPATIENT PHYSICAL THERAPY THORACOLUMBAR EVALUATION   Patient Name: Katelyn Kelly MRN: [id number] DOB:December 20, 1960, 63 y.o., female Today's Date: 06/13/2024  END OF SESSION:  PT End of Session - 06/13/24 1527     Visit Number 1    Date for PT Re-Evaluation 07/25/24    Authorization Type [employer]    Authorization Time Period no auth required    Progress Note Due on Visit 10    PT Start Time 1431    PT Stop Time 1510    PT Time Calculation (min) 39 min    Activity Tolerance Patient tolerated treatment well;Patient limited by pain    Behavior During Therapy WFL for tasks assessed/performed          Past Medical History:  Diagnosis Date   Anxiety and depression    Bilateral carpal tunnel syndrome    Chronic back pain    Chronic neck pain    Depression    Diabetes mellitus    GERD (gastroesophageal reflux disease)    H/O syncope    Headache    History of panic attacks    Hypertension    IBS (irritable bowel syndrome)    Manic depression (HCC)    Migraine    Panic attack    Past Surgical History:  Procedure Laterality Date   ABDOMINAL HYSTERECTOMY     ABDOMINAL SURGERY     APPENDECTOMY     BIOPSY  09/05/2017   Procedure: BIOPSY;  Surgeon: Harvey Margo CROME, MD;  Location: [facility];  Service: Endoscopy;;  random colon   BIOPSY  05/01/2018   Procedure: BIOPSY;  Surgeon: Harvey Margo CROME, MD;  Location: [facility];  Service: Endoscopy;;  gastric biopsy   CARPAL TUNNEL RELEASE Bilateral    CHOLECYSTECTOMY     COLONOSCOPY WITH PROPOFOL  N/A 09/05/2017   Procedure: COLONOSCOPY WITH PROPOFOL ;  Surgeon: Harvey Margo CROME, MD; normal TI, 5 small polyps, diverticulosis in the cecum, internal and external hemorrhoids.  Random colon biopsies benign.  Colon polyps were hyperplastic.  Repeat in 2023.   CORONARY BALLOON ANGIOPLASTY N/A 06/28/2021   Procedure: CORONARY BALLOON ANGIOPLASTY;  Surgeon: Wonda Sharper, MD;  Location: [facility] INVASIVE CV LAB;  Service:  Cardiovascular;  Laterality: N/A;   DILATION AND CURETTAGE OF UTERUS     ESOPHAGOGASTRODUODENOSCOPY (EGD) WITH PROPOFOL  N/A 05/01/2018   Procedure: ESOPHAGOGASTRODUODENOSCOPY (EGD) WITH PROPOFOL ;  Surgeon: Harvey Margo CROME, MD;  normal esophagus, small hiatal hernia, mild gastritis s/p biopsy, normal duodenum.  No H. pylori.    HERNIA REPAIR     INCISIONAL HERNIA REPAIR N/A 11/06/2020   Procedure: HERNIA REPAIR INCISIONAL, PRIMARY REPAIR;  Surgeon: Kallie Manuelita BROCKS, MD;  Location: [facility];  Service: General;  Laterality: N/A;   LEFT HEART CATH AND CORONARY ANGIOGRAPHY N/A 06/28/2021   Procedure: LEFT HEART CATH AND CORONARY ANGIOGRAPHY;  Surgeon: Wonda Sharper, MD;  Location: [facility] INVASIVE CV LAB;  Service: Cardiovascular;  Laterality: N/A;   POLYPECTOMY  09/05/2017   Procedure: POLYPECTOMY;  Surgeon: Harvey Margo CROME, MD;  Location: [facility];  Service: Endoscopy;;  sigmoid and rectal   TUBAL LIGATION     Patient Active Problem List   Diagnosis Date Noted   Back pain of lumbar region with sciatica 03/26/2024   Elevated LFTs 01/02/2024   Abdominal distension 12/18/2023   Abdominal pain, epigastric 08/29/2023   Constipation 08/29/2023   FH: colon cancer 08/29/2023   Chronic obstructive pulmonary disease, unspecified COPD type (HCC) 07/19/2023   Leukocytosis 07/19/2023  Erythrocytosis 07/19/2023   Counseling, unspecified 07/19/2023   Hyperlipidemia associated with type 2 diabetes mellitus (HCC) 06/29/2021   Type 2 diabetes mellitus with complication, with long-term current use of insulin (HCC) 06/29/2021   Tobacco use 06/29/2021   Coronary artery disease involving native coronary artery of native heart with unstable angina pectoris (HCC) 06/29/2021   CAD S/P percutaneous coronary angioplasty of OM1 06/28/2021   NSTEMI (non-ST elevated myocardial infarction) (HCC) 06/26/2021   Incisional hernia, without obstruction or gangrene 10/27/2020   Early satiety 05/28/2020   Panic disorder  12/11/2019   Flank pain 12/09/2019   Diastasis recti 04/11/2019   Intermittent upper abdominal pain 03/28/2019   MDD (major depressive disorder), recurrent episode, moderate (HCC) 11/26/2018   PTSD (post-traumatic stress disorder) 11/26/2018   Nausea and vomiting 02/14/2018   GERD (gastroesophageal reflux disease) 11/08/2017   Dysphagia 11/08/2017   Taking multiple medications for chronic disease 08/02/2017   Family history of colon cancer 08/02/2017   IBS (irritable bowel syndrome) 08/02/2017   Lateral epicondylitis 08/27/2009   JOINT PAIN, HAND 07/30/2009   TRIGGER FINGER 07/30/2009   CARPAL TUNNEL SYNDROME 03/04/2009   Brachial neuritis or radiculitis 03/04/2009   NUMBNESS, HAND 02/02/2009    PCP: [name], FNP   REFERRING PROVIDER: Carilyn Prentice BRAVO, MD  REFERRING DIAG:  M54.40 (ICD-10-CM) - Back pain of lumbar region with sciatica    Rationale for Evaluation and Treatment: Rehabilitation  THERAPY DIAG:  Other low back pain  Weakness of both lower extremities  Impaired functional mobility and activity tolerance  ONSET DATE: few months  SUBJECTIVE:                                                                                                                                                                                           SUBJECTIVE STATEMENT: Pt states the back pain has been around for years, trying to avoid surgery. Pt states the pain has significantly increased in the last 2 months, sciatica is going down both Les but RLE is worse. Pt states pain goes all the way down to the toes.  PERTINENT HISTORY:  Diabetes, 4 shots Heart attack, 3 years ago this October Panic attacks, earlier this week   PAIN:  Are you having pain? Yes: NPRS scale: 8/10 Pain location: waist to toes Pain description: stabbing, stinging, burning Aggravating factors: walking, standing sitting Relieving factors: pain meds, but had funny feeling and has not been taking it,  laying bent over  PRECAUTIONS: None  RED FLAGS: Bowel or bladder incontinence: No, on meds for constipation  WEIGHT BEARING RESTRICTIONS: No  FALLS:  Has patient fallen in last 6  months? Yes. Number of falls 2, right leg gave out  LIVING ENVIRONMENT: Lives with: sister Lives in: House/apartment Stairs: Yes: External: 3 steps; none Has following equipment at home: None  OCCUPATION: disability, for nerves/diabetes  PLOF: Independent and Independent with basic ADLs  PATIENT GOALS: decrease the pain, walk better, increase energy, increase endurance  NEXT MD VISIT: end of September  OBJECTIVE:  Note: Objective measures were completed at Evaluation unless otherwise noted.  DIAGNOSTIC FINDINGS:  CLINICAL DATA:  severe left radicular pain   EXAM: MRI LUMBAR SPINE WITHOUT CONTRAST   TECHNIQUE: Multiplanar, multisequence MR imaging of the lumbar spine was performed. No intravenous contrast was administered.   COMPARISON:  None Available.   FINDINGS: Segmentation:  Standard.   Alignment: Trace anterolisthesis of L4 on L5 and retrolisthesis of L5 on S1.   Vertebrae: No fracture, evidence of discitis, or bone lesion. Mild T2 hyperintense signal abnormality in the bilateral pedicles at L5 could be seen in the setting of stress reaction.   Conus medullaris and cauda equina: Conus extends to the L2 level. Conus and cauda equina appear normal.   Paraspinal and other soft tissues: Redemonstrated are bilateral adrenal lesions, which were previously characterized to be benign.   Disc levels:   T12-L1: Minimal disc bulge. No spinal canal or neural foraminal narrowing.   L1-L2: Mild bilateral facet degenerative change. Minimal disc bulge. No spinal canal narrowing. No neural foraminal narrowing.   L2-L3: Mild bilateral facet degenerative change. Minimal disc bulge. Mild spinal canal narrowing. No significant neural foraminal narrowing.   L3-L4: Moderate bilateral facet  degenerative change. Ligamentum flavum hypertrophy. Circumferential disc bulge with a superimposed left paracentral disc protrusion that narrows the left lateral recess (series 8, image 25). Moderate bilateral neural foraminal narrowing.   L4-L5: Severe bilateral facet degenerative change. Circumferential disc bulge. Severe spinal canal narrowing. Mild right and moderate left neural foraminal narrowing. The extraforaminal component of the disc bulge contacts the exited left L4 nerve root.   L5-S1: Central disc protrusion. No overall spinal canal narrowing. Moderate left and mild right facet degenerative change. Moderate bilateral facet degenerative change, left-greater-than-right.   IMPRESSION: 1. Severe spinal canal narrowing at L4-L5 secondary to combination of ligamentum flavum hypertrophy and circumferential disc bulge. The extraforaminal component of the disc bulge contacts the exited left L4 nerve root. 2. Moderate left neural foraminal narrowing at L3-L4 and L4-L5. 3. Mild T2 hyperintense signal abnormality in the bilateral pedicles at L5 could be seen in the setting of stress reaction.  PATIENT SURVEYS:  Modified Oswestry: 29/50  COGNITION: Overall cognitive status: Within functional limits for tasks assessed     SENSATION: WFL  POSTURE: rounded shoulders, forward head, posterior pelvic tilt, and flexed trunk   PALPATION: Increased tenderness to palpation of left lumbar para spinal musculature and left posterior hip.   LUMBAR ROM:   AROM eval  Flexion 20, pain  Extension 5, no increased pain  Right lateral flexion 23, pain  Left lateral flexion 15, worse pain  Right rotation 50% available  Left rotation 50% available, increased pain   (Blank rows = not tested)  LOWER EXTREMITY ROM:     Active  Right eval Left eval  Hip flexion    Hip extension    Hip abduction    Hip adduction    Hip internal rotation    Hip external rotation    Knee flexion     Knee extension    Ankle dorsiflexion    Ankle plantarflexion  Ankle inversion    Ankle eversion     (Blank rows = not tested)  LOWER EXTREMITY MMT:    MMT Right eval Left eval  Hip flexion 4 4  Hip extension 4- 4-  Hip abduction 3+ 3+  Hip adduction 3+ 3+  Hip internal rotation    Hip external rotation    Knee flexion 3+ 3+  Knee extension 3+ 3+  Ankle dorsiflexion 3 3+  Ankle plantarflexion    Ankle inversion    Ankle eversion     (Blank rows = not tested)  LUMBAR SPECIAL TESTS:  Straight leg raise test: Positive and Slump test: Positive Left side positive  FUNCTIONAL TESTS:  5 times sit to stand: 22.59, SOB noted 2 minute walk test: TBA  GAIT: Distance walked: 80 feet to and from treatment area Assistive device utilized: None Level of assistance: Complete Independence Comments: pt demonstrates antalgic gait pattern with decreased LLE stride length and deceased gait speed.  TREATMENT DATE:  06/13/2024  Evaluation: -ROM measured, Strength assessed, HEP prescribed, pt educated on prognosis, findings, and importance of HEP compliance if given.                                                                                                                                    PATIENT EDUCATION:  Education details: Pt was educated on findings of PT evaluation, prognosis, frequency of therapy visits and rationale, attendance policy, and HEP if given.   Person educated: Patient Education method: Explanation, Verbal cues, and Handouts Education comprehension: verbalized understanding, verbal cues required, and needs further education  HOME EXERCISE PROGRAM: Access Code: [email or web address] URL: [email or web address] Date: 06/13/2024 Prepared by: Lang Ada  Exercises - Supine Lower Trunk Rotation  - 1 x daily - 7 x weekly - 3 sets - 10 reps - Neutral Curl Up with Straight Leg  - 1 x daily - 7 x weekly - 3 sets - 10 reps - 3 hold - Supine Bridge  - 1 x  daily - 7 x weekly - 3 sets - 10 reps - 3 hold  ASSESSMENT:  CLINICAL IMPRESSION: Patient is a 63 y.o. female who was seen today for physical therapy evaluation and treatment for  M54.40 (ICD-10-CM) - Back pain of lumbar region with sciatica   Patient demonstrates increased low back pain and LLE pain, decreased LE strength, abnormal tenderness to palpation to Left lumbar region and left sided lumbar paraspinals, and impaired gait. Patient also demonstrates difficulty with ambulation during today's session with antalgic pattern, decreased stride length and velocity noted. Patient also demonstrates most pain with flexion, left rotation and left lateral flexion during Lumbar AROM. Patient requires education on role of PT, importance of HEP compliance, and prognosis. Patient would benefit from skilled physical therapy for decreased pain with functional mobility, increased endurance with ambulation, increased LE/core strength, and balance for improved gait quality, return to higher level of function with ADLs, and  progress towards therapy goals.    OBJECTIVE IMPAIRMENTS: Abnormal gait, decreased activity tolerance, decreased balance, decreased endurance, decreased mobility, difficulty walking, decreased ROM, decreased strength, impaired flexibility, obesity, and pain.   ACTIVITY LIMITATIONS: carrying, lifting, bending, sitting, standing, squatting, sleeping, stairs, transfers, and bed mobility  PARTICIPATION LIMITATIONS: meal prep, cleaning, laundry, driving, community activity, and yard work  PERSONAL FACTORS: Age, Fitness, Time since onset of injury/illness/exacerbation, and 1 comorbidity: diabetes are also affecting patient's functional outcome.   REHAB POTENTIAL: Fair chronic in nature  CLINICAL DECISION MAKING: Stable/uncomplicated  EVALUATION COMPLEXITY: Low   GOALS: Goals reviewed with patient? No  SHORT TERM GOALS: Target date: 07/04/24  Pt will be independent with HEP in order to  demonstrate participation in Physical Therapy POC.  Baseline: Goal status: INITIAL  2.  Pt will report 6/10 pain with mobility in order to demonstrate improved pain with ADLs.  Baseline:  Goal status: INITIAL  LONG TERM GOALS: Target date: 07/25/24  Pt will improve 5TSTS by at least 2.3 seconds in order to demonstrate improved functional strength to return to desired activities.  Baseline: see objective.  Goal status: INITIAL  2.  Pt will improve 2 MWT by 40 feet in order to demonstrate improved functional ambulatory capacity in community setting.  Baseline: see objective.  Goal status: INITIAL  3.  Pt will improve Modified Oswestry score by at least 6 points in order to demonstrate improved pain with functional goals and outcomes. Baseline: see objective.  Goal status: INITIAL  4.  Pt will report 4/10 pain with mobility in order to demonstrate reduced pain with ADLs lasting greater than 30 minutes.  Baseline: see objective.  Goal status: INITIAL   PLAN:  PT FREQUENCY: 1-2x/week  PT DURATION: 6 weeks  PLANNED INTERVENTIONS: 97110-Therapeutic exercises, 97530- Therapeutic activity, [id number]- Neuromuscular re-education, 97535- Self Care, [id number]- Manual therapy, [id number]- Gait training, Patient/Family education, Balance training, Stair training, Spinal mobilization, DME instructions, Cryotherapy, and Moist heat.  PLAN FOR NEXT SESSION: , review goals and HEP, progress core and proximal LE strengthening, trial endurance training pt concerned about her SOB and frequent rest breaks   Lang Ada, PT, DPT [facility] Office: [phone] 3:29 PM, 06/13/24

## 2024-06-19 ENCOUNTER — Encounter (HOSPITAL_COMMUNITY)

## 2024-06-26 ENCOUNTER — Telehealth: Payer: Self-pay

## 2024-06-26 ENCOUNTER — Ambulatory Visit

## 2024-06-26 VITALS — BP 108/71 | HR 88 | Ht 61.0 in | Wt 182.0 lb

## 2024-06-26 DIAGNOSIS — M5440 Lumbago with sciatica, unspecified side: Secondary | ICD-10-CM

## 2024-06-26 DIAGNOSIS — R5382 Chronic fatigue, unspecified: Secondary | ICD-10-CM

## 2024-06-26 MED ORDER — HYDROCODONE-ACETAMINOPHEN 7.5-325 MG PO TABS: 1.0000 | ORAL_TABLET | Freq: Four times a day (QID) | ORAL | 0 refills | Status: DC | PRN

## 2024-06-26 NOTE — Progress Notes (Signed)
 Established Patient Office Visit  Subjective   Patient ID: Katelyn Kelly, female    DOB: 05/28/1961  Age: 63 y.o. MRN: [id number]  Chief Complaint  Patient presents with   Medical Management of Chronic Issues    3 month follow up    HPI Discussed the use of AI scribe software for clinical note transcription with the patient, who gave verbal consent to proceed.  History of Present Illness   Katelyn Kelly is a 63 year old female with diabetes and chronic back pain who presents for a three-month follow-up.  Fatigue and sleep disturbance - Persistent fatigue characterized by lack of energy and motivation - Fatigue perceived by others as unwillingness to engage in activities, but she feels exhausted after minimal activity - Sleep frequently interrupted by back pain and episodes of hyperglycemia - Nocturnal awakenings due to high blood sugar levels - Fatigue attributed to poor sleep quality  Glycemic control and hyperglycemia - Blood glucose levels range from 70 to 400 mg/dL - Recent blood glucose reading of 344 mg/dL - Hemoglobin J8r was 1.8% three weeks ago, improved from 10.4% three years ago - Endocrinologist is adjusting insulin  regimen - Monitors blood glucose with a glucose sensor - Nocturnal hyperglycemia contributes to sleep disruption  Chronic back pain - Severe back pain persistent despite use of over-the-counter medications such as Tylenol  and ibuprofen  - Back pain disrupts sleep - Physical therapy attempted once, plans to schedule additional sessions - Previously used pain medication but discontinued due to adverse effects - Currently taking methocarbamol  at bedtime, increased to two tablets at night  Neuropathic pain and sciatica - Neuropathic pain with sciatica radiating down both legs - Neuropathic pain disrupts sleep - Gabapentin and Lyrica previously trialed without relief - Currently taking pramipexole  for restless leg syndrome, which alleviates  those symptoms but not neuropathic pain  Migraine headaches - History of migraines previously managed with Toradol  - Toradol  discontinued due to concerns regarding diabetes and potential renal impact      ROS    Objective:     BP 108/71   Pulse 88   Ht 5' 1 (1.549 m)   Wt 182 lb 0.6 oz (82.6 kg)   SpO2 96%   BMI 34.40 kg/m  BP Readings from Last 3 Encounters:  06/26/24 108/71  06/07/24 120/75  06/05/24 102/60   Wt Readings from Last 3 Encounters:  06/26/24 182 lb 0.6 oz (82.6 kg)  06/07/24 182 lb (82.6 kg)  06/05/24 182 lb 3.2 oz (82.6 kg)     Physical Exam Vitals and nursing note reviewed.  Constitutional:      Appearance: Normal appearance.  HENT:     Head: Normocephalic.  Eyes:     Extraocular Movements: Extraocular movements intact.     Pupils: Pupils are equal, round, and reactive to light.  Cardiovascular:     Rate and Rhythm: Normal rate and regular rhythm.  Pulmonary:     Effort: Pulmonary effort is normal.     Breath sounds: Normal breath sounds.  Musculoskeletal:     Cervical back: Normal range of motion and neck supple.     Lumbar back: Tenderness present. Decreased range of motion. Positive left straight leg raise test. Negative right straight leg raise test.  Neurological:     Mental Status: She is alert and oriented to person, place, and time.  Psychiatric:        Mood and Affect: Mood normal.        Thought Content: Thought  content normal.         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 Oxycodone  caused side effects. Physical  therapy initiated but only one session attended. She prefers non-surgical management. - Prescribe hydrocodone  for pain management. - Encourage continuation and scheduling of physical therapy sessions.      Relevant Medications   HYDROcodone -acetaminophen  (NORCO) 7.5-325 MG tablet     Other   Chronic fatigue - Primary   Insomnia and fatigue likely secondary to chronic pain, restless legs syndrome, and fluctuating blood glucose levels. Poor sleep quality reported. - Order blood work to evaluate potential causes of fatigue, including vitamin deficiencies. - Address pain management and glycemic control to improve sleep quality.      Relevant Orders   Vitamin B12 (Completed)   Folate (Completed)   Iron and TIBC (Completed)   Ferritin (Completed)   CBC (Completed)   Vitamin D  (25 hydroxy) (Completed)         No follow-ups on file.    Leita Longs, FNP

## 2024-06-26 NOTE — Telephone Encounter (Signed)
 Pt stated her glucose has been elevated. Pt brought her Libre CGM reader to the office, data uploaded and printed. Pt states she is taking Tresiba  25 units at bedtime and Humalog  SS 5-11 units TIDAC. Dr.Nida evaluated pt's CGM data. Advised pt to increase her Tresiba  to 30 units at bedtime and continue injecting Humalog  SS 5-11 units TIDAC per Dr.Nida's orders. Pt voiced understanding.

## 2024-06-27 LAB — IRON AND TIBC
Iron Saturation: 23 % (ref 15–55)
Iron: 62 ug/dL (ref 27–139)
Total Iron Binding Capacity: 271 ug/dL (ref 250–450)
UIBC: 209 ug/dL (ref 118–369)

## 2024-06-27 LAB — FOLATE: Folate: 11 ng/mL (ref >3.0)

## 2024-06-27 LAB — CBC
Hematocrit: 51.1 % — ABNORMAL HIGH (ref 34.0–46.6)
Hemoglobin: 16.4 g/dL — ABNORMAL HIGH (ref 11.1–15.9)
MCH: 30.5 pg (ref 26.6–33.0)
MCHC: 32.1 g/dL (ref 31.5–35.7)
MCV: 95 fL (ref 79–97)
Platelets: 223 x10E3/uL (ref 150–450)
RBC: 5.37 x10E6/uL — ABNORMAL HIGH (ref 3.77–5.28)
RDW: 12.3 % (ref 11.7–15.4)
WBC: 12.2 x10E3/uL — ABNORMAL HIGH (ref 3.4–10.8)

## 2024-06-27 LAB — VITAMIN D 25 HYDROXY (VIT D DEFICIENCY, FRACTURES): Vit D, 25-Hydroxy: 40.7 ng/mL (ref 30.0–100.0)

## 2024-06-27 LAB — VITAMIN B12: Vitamin B-12: 881 pg/mL (ref 232–1245)

## 2024-06-27 LAB — FERRITIN: Ferritin: 89 ng/mL (ref 15–150)

## 2024-07-01 ENCOUNTER — Ambulatory Visit: Payer: Self-pay

## 2024-07-01 DIAGNOSIS — R5382 Chronic fatigue, unspecified: Secondary | ICD-10-CM | POA: Insufficient documentation

## 2024-07-01 NOTE — Assessment & Plan Note (Signed)
 Insomnia and fatigue likely secondary to chronic pain, restless legs syndrome, and fluctuating blood glucose levels. Poor sleep quality reported. - Order blood work to evaluate potential causes of fatigue, including vitamin deficiencies. - Address pain management and glycemic control to improve sleep quality.

## 2024-07-01 NOTE — Assessment & Plan Note (Signed)
 Chronic low back pain with sciatica Chronic low back pain with sciatica causing discomfort and affecting sleep. Oxycodone  caused side effects. Physical therapy initiated but only one session attended. She prefers non-surgical management. - Prescribe hydrocodone  for pain management. - Encourage continuation and scheduling of physical therapy sessions.

## 2024-07-25 ENCOUNTER — Other Ambulatory Visit: Payer: Self-pay | Admitting: Gastroenterology

## 2024-07-26 ENCOUNTER — Telehealth: Payer: Self-pay

## 2024-07-26 MED ORDER — LINACLOTIDE 290 MCG PO CAPS: 290.0000 ug | ORAL_CAPSULE | Freq: Every day | ORAL | 3 refills | Status: DC

## 2024-07-26 NOTE — Addendum Note (Signed)
 Addended by: EZZARD SONNY RAMAN on: 07/26/2024 10:40 AM   Modules accepted: Orders

## 2024-07-26 NOTE — Telephone Encounter (Signed)
 Lmom making pt aware

## 2024-07-26 NOTE — Telephone Encounter (Signed)
 Ok. I wrote for it at her April OV.  If she is not taking motegrity , I will write for the Linzess  but she can't take both.

## 2024-07-26 NOTE — Telephone Encounter (Signed)
 Pharmacy called regarding refill request for Linzess , stated that they received a denial due to pt being on Motegrity , however Motegrity  has never been filled for pt. Please advise.

## 2024-08-01 ENCOUNTER — Encounter: Attending: Physical Medicine & Rehabilitation | Admitting: Physical Medicine & Rehabilitation

## 2024-08-01 DIAGNOSIS — M5440 Lumbago with sciatica, unspecified side: Secondary | ICD-10-CM | POA: Insufficient documentation

## 2024-08-02 ENCOUNTER — Telehealth: Payer: Self-pay | Admitting: *Deleted

## 2024-08-02 ENCOUNTER — Encounter: Payer: Self-pay | Admitting: Gastroenterology

## 2024-08-02 ENCOUNTER — Ambulatory Visit: Admitting: Gastroenterology

## 2024-08-02 VITALS — BP 104/63 | HR 80 | Temp 98.1°F | Ht 61.0 in | Wt 181.4 lb

## 2024-08-02 DIAGNOSIS — K5900 Constipation, unspecified: Secondary | ICD-10-CM | POA: Diagnosis not present

## 2024-08-02 DIAGNOSIS — R140 Abdominal distension (gaseous): Secondary | ICD-10-CM

## 2024-08-02 DIAGNOSIS — Z800 Family history of malignant neoplasm of digestive organs: Secondary | ICD-10-CM | POA: Diagnosis not present

## 2024-08-02 MED ORDER — LINACLOTIDE 290 MCG PO CAPS: 290.0000 ug | ORAL_CAPSULE | Freq: Every day | ORAL | 11 refills | Status: AC

## 2024-08-02 MED ORDER — LINACLOTIDE 290 MCG PO CAPS: 290.0000 ug | ORAL_CAPSULE | Freq: Every day | ORAL | Status: AC

## 2024-08-02 NOTE — Telephone Encounter (Signed)
 Medication Samples have been provided to the patient.  Drug name: Linzess        Strength: 290 mcg        Qty: 2  LOT: [id number]  Exp.Date: 02/2025  Dosing instructions: Take one 30 mins before breakfast.   The patient has been instructed regarding the correct time, dose, and frequency of taking this medication, including desired effects and most common side effects.   Adrien Charmaine Jean 10:49 AM 08/02/2024

## 2024-08-02 NOTE — Patient Instructions (Signed)
 Continue Linzess  290mcg daily. You can take at whatever time of the day that works best for you.   If you find it becomes not effective, please let me know and we will make changes.   Please reach out when you are ready to schedule your colonoscopy.  Return office visit in six months if you do not have your colonoscopy before that. If you have your colonoscopy before six months from now, you can follow up in a year.

## 2024-08-02 NOTE — Progress Notes (Signed)
 GI Office Note    Referring Provider: [name], FNP Primary Care Physician:  [name], FNP  Primary Gastroenterologist: Carlin POUR. Cindie, DO   Chief Complaint   Chief Complaint  Patient presents with   Follow-up    Follow up. No problem. Need a refill on Linzess  290    History of Present Illness   Katelyn Kelly is a 63 y.o. female presenting today for follow up. Last seen 01/2024. H/o GERD, constipation, elevated ALT, FH of CRC.  Previously failed Trulance , Amitiza . Limited work up of elevated ALT unremarkable. LFTs returned to normal.   Discussed the use of AI scribe software for clinical note transcription with the patient, who gave verbal consent to proceed.   She feels extremely unwell due to her current insulin  regimen, describing a sensation of 'feeling like I'm dying'. Her A1c is currently at 8.0. She has been on her current insulin  for about nine years and has recently experienced significant fluctuations in her blood sugar levels, with readings reaching 250 mg/dL or higher after taking her Humolog, and then dropping to as low as 70 mg/dL. She has adjusted her insulin  intake by not taking it during the day if her sugar is not over 250 mg/dL, which has alleviated the 'yucky' feeling. Her blood sugar levels have not exceeded 220 mg/dL in the past two weeks since making this change. She has been documenting her blood sugar levels and insulin  intake meticulously, noting that she feels unwell when her blood sugar is around 90 mg/dL, preferring it to be between 130-200 mg/dL.  She has modified her diet, primarily consuming salads, healthy choice chicken nuggets, cheeses, cottage cheese, sweet potatoes, plain oatmeal, and yogurt. She reports that these dietary changes have been beneficial, and she feels better than she did two weeks ago. She mentions that she has been feeling better over the past two weeks, able to perform daily activities such as grocery shopping,  which she was unable to do previously due to feeling extremely unwell.  She is currently taking Linzess  290 mcg in the mornings for constipation, she typically would have BM in two hours but would  experience bloating which would get worse throughout the day.She tried taking Linzess  at 5 PM once, which resulted in reduced bloating and a bowel movement two hours later. She has not yet tried taking it in the evening consistently. She never received the [employer]  prescription that was sent after her last ov.  Her acid reflux symptoms have resolved. She is no longer on medication. Her liver enzymes were normal in April. She is aware of her family history of colon cancer and has not had a colonoscopy since 2018. She was due in 2023.       Prior Data   01/2024: Hepatitis B surface antibody nonreactive, hepatitis A total antibody nonreactive, hepatitis B core antibody negative, ferritin 53, iron 67, TIBC 340, iron sat 20, AST 21, ALT 30, alk phos 91, total bilirubin 0.5, hepatitis B surface antigen nonreactive, HCV antibody nonreactive  August 2025: Folate 11, B12 881, iron 62, iron sat 23, TIBC 271, ferritin 89, hemoglobin 16.4, white blood cell count 4.2, MCV 95, platelets 223, MND 40.7.  MRI Abd with and without contrast 08/2023: IMPRESSION: 1. No MRI findings of the abdomen to explain abdominal pain or bloating. 2. Status post cholecystectomy. 3. Definitively benign, macroscopic fat containing bilateral adrenal adenomata and left renal angiomyolipomas, requiring no further follow-up or characterization.  EGD 04/2018: -small hiatal hernia -  mild gastritis   Colonoscopy 08/2017: -five 3-4 mm polyps in rectum and sigmoid colon, hyperplastic -significant looping left colon -diverticulosis -internal hemorrhoids -external hemorrhoids -random colon biopsies normal -colonoscopy five years due to FH   Medications   Current Outpatient Medications  Medication Sig Dispense Refill   aspirin  EC 81  MG tablet Take 1 tablet (81 mg total) by mouth daily. Swallow whole.     atorvastatin  (LIPITOR ) 40 MG tablet Take 1 tablet by mouth daily.     busPIRone  (BUSPAR ) 7.5 MG tablet Take by mouth See admin instructions. Take 0.5 tablets (3.75 mg total) by mouth 3 times daily for 7 days, THEN 1 tablet (7.5 mg total) 3 times daily for 7 days, THEN 2 tablets (15 mg total) 3 times daily     Continuous Glucose Sensor (FREESTYLE LIBRE 3 PLUS SENSOR) MISC Change sensor every 15 days. 6 each 3   Fremanezumab -vfrm (AJOVY ) 225 MG/1.5ML SOAJ Inject 225 mg into the skin every 30 (thirty) days. 1.68 mL 11   furosemide  (LASIX ) 20 MG tablet Take 1 tablet (20 mg total) by mouth daily as needed for fluid (Swelling). 90 tablet 3   glucose blood (ACCU-CHEK GUIDE TEST) test strip Use as instructed to monitor glucose 4 times daily 300 each 12   HYDROcodone -acetaminophen  (NORCO) 7.5-325 MG tablet Take 1 tablet by mouth every 6 (six) hours as needed for moderate pain (pain score 4-6). 30 tablet 0   insulin  degludec (TRESIBA ) 100 UNIT/ML FlexTouch Pen Inject 25 Units into the skin at bedtime. Inject 30 units daily at bedtime as instructed by the provider. 25 mL 3   insulin  lispro (HUMALOG ) 100 UNIT/ML KwikPen Inject 5-11 Units into the skin 3 (three) times daily. Patient is to inject 5-11 units per sliding scale as directed by the provider. 30 mL 3   ipratropium (ATROVENT  HFA) 17 MCG/ACT inhaler Inhale 2 puffs into the lungs every 6 (six) hours as needed for wheezing. 1 each 0   isosorbide  mononitrate (IMDUR ) 60 MG 24 hr tablet Take 1 tablet (60 mg total) by mouth daily. 90 tablet 3   linaclotide  (LINZESS ) 290 MCG CAPS capsule Take 1 capsule (290 mcg total) by mouth daily before breakfast.     Menthol, Topical Analgesic, (BIOFREEZE) 10 % LIQD Apply 1 application topically daily as needed (pain).     methocarbamol  (ROBAXIN ) 750 MG tablet Take 1-2 tablets (750-1,500 mg total) by mouth at bedtime. 30 tablet 2   metoprolol  tartrate  (LOPRESSOR ) 25 MG tablet TAKE (1/2) TABLET BY MOUTH TWICE DAILY 90 tablet 3   nitroGLYCERIN  (NITROSTAT ) 0.4 MG SL tablet PLACE ONE TABLET UNDER THE TOUNGE EVERY FIVE MINUTES FOR 3 DOSES AS NEEDED FOR CHEST PAINS. 25 tablet 3   NURTEC 75 MG TBDP TAKE ONE TABLET DAILY AS NEEDED. 8 tablet 6   ondansetron  (ZOFRAN -ODT) 4 MG disintegrating tablet Take 1 tablet (4 mg total) by mouth every 8 (eight) hours as needed for nausea or vomiting. 30 tablet 2   pramipexole  (MIRAPEX ) 0.25 MG tablet Take 0.25 mg by mouth at bedtime.     SURE COMFORT PEN NEEDLES 32G X 4 MM MISC Use one test strip to check blood sugars 4 times a day as instructed by the provider. 200 each 2   linaclotide  (LINZESS ) 290 MCG CAPS capsule Take 1 capsule (290 mcg total) by mouth daily. 30 capsule 11   No current facility-administered medications for this visit.    Allergies   Allergies as of 08/02/2024 - Review Complete 08/02/2024  Allergen Reaction Noted   Meprobamate Hives and Swelling 10/28/2020   Soliqua [insulin  glargine-lixisenatide] Other (See Comments) 02/02/2024   Tranxene [clorazepate dipotassium] Hives and Swelling 01/10/2012   Albuterol  Other (See Comments) 11/08/2017   Doxepin hcl Other (See Comments) 02/06/2024   Haloperidol Other (See Comments) 11/08/2017   Sertraline Other (See Comments) 11/08/2017   Vortioxetine Other (See Comments) 11/08/2017   Elavil [amitriptyline] Palpitations 05/20/2022      Review of Systems   General: Negative for anorexia, weight loss, fever, chills, fatigue, weakness. ENT: Negative for hoarseness, difficulty swallowing , nasal congestion. CV: Negative for chest pain, angina, palpitations, dyspnea on exertion, peripheral edema.  Respiratory: Negative for dyspnea at rest, dyspnea on exertion, cough, sputum, wheezing.  GI: See history of present illness. GU:  Negative for dysuria, hematuria, urinary incontinence, urinary frequency, nocturnal urination.  Endo: Negative for unusual  weight change.     Physical Exam   BP 104/63 (BP Location: Left Arm, Patient Position: Sitting, Cuff Size: Large)   Pulse 80   Temp 98.1 F (36.7 C) (Temporal)   Ht 5' 1 (1.549 m)   Wt 181 lb 6.4 oz (82.3 kg)   BMI 34.28 kg/m    General: Well-nourished, well-developed in no acute distress.  Eyes: No icterus. Mouth: Oropharyngeal mucosa moist and pink   Abdomen: Bowel sounds are normal, nontender, nondistended, no hepatosplenomegaly or masses,  no abdominal bruits or hernia , no rebound or guarding.  Rectal: not performed Extremities: No lower extremity edema. No clubbing or deformities. Neuro: Alert and oriented x 4   Skin: Warm and dry, no jaundice.   Psych: Alert and cooperative, normal mood and affect.  Labs   See above  Imaging Studies   No results found.  Assessment/Plan:   Constipation/abdominal distention: -Linzess  seems to be helping having BM most day. Seemed to have less bloating when taking it in the evening. Will switch timing of Linzess . Monitor symptoms. If becomes ineffective, we can switch to another regimen.    -return ov in six months unless she has colonoscopy before then at which time she can make follow up in one year.   Family history colon cancer: Patient is overdue for high risk screening colonoscopy, continues to want to hold off until she has better control of her sugars. She will let me know when she is ready.   Elevated ALT: -MRI liver unremarkable 2024 -viral makers, iron labs were neg -LFTs now normal   GERD:  -no longer having symptoms. Now off medications.     Katelyn Kelly, MHS, PA-C [facility]

## 2024-08-21 ENCOUNTER — Ambulatory Visit: Admitting: Adult Health

## 2024-08-26 ENCOUNTER — Other Ambulatory Visit: Payer: Self-pay

## 2024-08-26 DIAGNOSIS — F410 Panic disorder [episodic paroxysmal anxiety] without agoraphobia: Secondary | ICD-10-CM

## 2024-08-26 MED ORDER — ALPRAZOLAM 1 MG PO TABS: 0.5000 mg | ORAL_TABLET | Freq: Three times a day (TID) | ORAL | 0 refills | Status: DC | PRN

## 2024-08-31 ENCOUNTER — Other Ambulatory Visit: Payer: Self-pay | Admitting: Cardiology

## 2024-09-03 ENCOUNTER — Other Ambulatory Visit: Payer: Self-pay

## 2024-09-03 DIAGNOSIS — M5440 Lumbago with sciatica, unspecified side: Secondary | ICD-10-CM

## 2024-09-03 MED ORDER — HYDROCODONE-ACETAMINOPHEN 7.5-325 MG PO TABS: 1.0000 | ORAL_TABLET | Freq: Four times a day (QID) | ORAL | 0 refills | Status: DC | PRN

## 2024-09-03 NOTE — Telephone Encounter (Signed)
 Copied from CRM #[id number]. Topic: Clinical - Medication Refill >> Sep 03, 2024 10:13 AM Wess RAMAN wrote: Medication: HYDROcodone -acetaminophen  (NORCO) 7.5-325 MG tablet   Has the patient contacted their pharmacy? Yes (Agent: If no, request that the patient contact the pharmacy for the refill. If patient does not wish to contact the pharmacy document the reason why and proceed with request.) (Agent: If yes, when and what did the pharmacy advise?) Call doctor  This is the patient's preferred pharmacy:  [facility] - [place], [place] - [address] [place] [address] Phone: [phone] Fax: [phone]  Is this the correct pharmacy for this prescription? Yes If no, delete pharmacy and type the correct one.   Has the prescription been filled recently? Yes  Is the patient out of the medication? Yes  Has the patient been seen for an appointment in the last year OR does the patient have an upcoming appointment? Yes  Can we respond through MyChart? Yes  Agent: Please be advised that Rx refills may take up to 3 business days. We ask that you follow-up with your pharmacy.

## 2024-09-04 ENCOUNTER — Other Ambulatory Visit: Payer: Self-pay

## 2024-09-04 MED ORDER — ATORVASTATIN CALCIUM 40 MG PO TABS
40.0000 mg | ORAL_TABLET | Freq: Every day | ORAL | 3 refills | Status: AC
Start: 2024-09-04 — End: ?

## 2024-09-04 NOTE — Telephone Encounter (Signed)
 Copied from CRM [id number]. Topic: Clinical - Medication Refill >> Sep 04, 2024  9:53 AM Sophia H wrote: Medication: atorvastatin  (LIPITOR ) 40 MG tablet   Has the patient contacted their pharmacy? Yes, need updated RX from PCP. RX on file was from old provider.    This is the patient's preferred pharmacy:  [facility] - [place], [place] - [address] [place] [address] Phone: [phone] Fax: [phone]  Is this the correct pharmacy for this prescription? Yes If no, delete pharmacy and type the correct one.   Has the prescription been filled recently? Yes  Is the patient out of the medication? Yes  Has the patient been seen for an appointment in the last year OR does the patient have an upcoming appointment? Yes, seen August 27th   Can we respond through MyChart? Yes  Agent: Please be advised that Rx refills may take up to 3 business days. We ask that you follow-up with your pharmacy.

## 2024-09-05 ENCOUNTER — Ambulatory Visit: Admitting: Nurse Practitioner

## 2024-09-05 DIAGNOSIS — I10 Essential (primary) hypertension: Secondary | ICD-10-CM

## 2024-09-05 DIAGNOSIS — Z794 Long term (current) use of insulin: Secondary | ICD-10-CM

## 2024-09-05 DIAGNOSIS — E782 Mixed hyperlipidemia: Secondary | ICD-10-CM

## 2024-09-06 ENCOUNTER — Ambulatory Visit

## 2024-09-06 ENCOUNTER — Telehealth: Payer: Self-pay | Admitting: Cardiology

## 2024-09-06 VITALS — BP 146/76 | HR 97 | Ht 61.0 in | Wt 181.0 lb

## 2024-09-06 DIAGNOSIS — E118 Type 2 diabetes mellitus with unspecified complications: Secondary | ICD-10-CM

## 2024-09-06 DIAGNOSIS — Z794 Long term (current) use of insulin: Secondary | ICD-10-CM

## 2024-09-06 DIAGNOSIS — M5440 Lumbago with sciatica, unspecified side: Secondary | ICD-10-CM

## 2024-09-06 DIAGNOSIS — R609 Edema, unspecified: Secondary | ICD-10-CM

## 2024-09-06 DIAGNOSIS — Z23 Encounter for immunization: Secondary | ICD-10-CM

## 2024-09-06 DIAGNOSIS — J449 Chronic obstructive pulmonary disease, unspecified: Secondary | ICD-10-CM | POA: Diagnosis not present

## 2024-09-06 DIAGNOSIS — G2581 Restless legs syndrome: Secondary | ICD-10-CM

## 2024-09-06 MED ORDER — FUROSEMIDE 20 MG PO TABS: 20.0000 mg | ORAL_TABLET | Freq: Two times a day (BID) | ORAL | 1 refills | Status: DC

## 2024-09-06 MED ORDER — PRAMIPEXOLE DIHYDROCHLORIDE 0.5 MG PO TABS
0.5000 mg | ORAL_TABLET | Freq: Every day | ORAL | 3 refills | Status: DC
Start: 2024-09-06 — End: 2024-09-12

## 2024-09-06 MED ORDER — PREGABALIN 75 MG PO CAPS: 75.0000 mg | ORAL_CAPSULE | Freq: Two times a day (BID) | ORAL | 2 refills | Status: DC

## 2024-09-06 MED ORDER — IPRATROPIUM BROMIDE HFA 17 MCG/ACT IN AERS: 2.0000 | INHALATION_SPRAY | Freq: Four times a day (QID) | RESPIRATORY_TRACT | 3 refills | Status: AC | PRN

## 2024-09-06 NOTE — Progress Notes (Signed)
 Established Patient Office Visit  Subjective   Patient ID: Katelyn Kelly, female    DOB: 23-Oct-1961  Age: 63 y.o. MRN: [id number]  Chief Complaint  Patient presents with   Medical Management of Chronic Issues    Right leg pain at a 9 right now on a scale of 1-10   Pt also having sciatica issues again   Pt also would like the swelling in her legs and feet check she stated her cardiologist can't get her into their office right now.     HPI Discussed the use of AI scribe software for clinical note transcription with the patient, who gave verbal consent to proceed.  History of Present Illness    Katelyn Kelly is a 63 year old female with a history of heart attack and diabetes who presents with leg pain and swelling.  Lower extremity pain and swelling - Persistent stinging, burning pain in the ankle, described as feeling like a 'bee sting' - Pain is unrelieved by current medication for restless legs (Requip and Mirapex ), taken as two tablets nightly, resulting in early depletion of medication - Swelling in the legs, more pronounced in one leg, has persisted since heart attack - Swelling does not improve overnight and remains tight and uncomfortable - Lasix  taken once daily for fluid retention, but swelling persists - Elevating legs provides some relief  Upper extremity pain - Pain in hands and fingers - Suspects pain may be related to magnesium deficiency - Takes magnesium supplements, but is unsure of type and dosage  Right leg sciatica - Significant pain in right leg attributed to sciatica - Previously referred to physical therapy but unable to attend due to transportation issues - Performs home exercises based on paperwork provided by her sister  Diabetes mellitus and glycemic control - Concerned that current insulin  regimen does not effectively manage blood glucose levels - Allergy to insulin  glargine - History of adverse reaction to medication, resulting in  caution with new medications     Patient Active Problem List   Diagnosis Date Noted   Restless leg syndrome 09/12/2024   Swelling 09/12/2024   Chronic fatigue 07/01/2024   Back pain of lumbar region with sciatica 03/26/2024   Elevated LFTs 01/02/2024   Abdominal distension 12/18/2023   Abdominal pain, epigastric 08/29/2023   Constipation 08/29/2023   FH: colon cancer 08/29/2023   Chronic obstructive pulmonary disease, unspecified COPD type (HCC) 07/19/2023   Leukocytosis 07/19/2023   Erythrocytosis 07/19/2023   Counseling, unspecified 07/19/2023   Hyperlipidemia associated with type 2 diabetes mellitus (HCC) 06/29/2021   Type 2 diabetes mellitus with complication, with long-term current use of insulin  (HCC) 06/29/2021   Tobacco use 06/29/2021   Coronary artery disease involving native coronary artery of native heart with unstable angina pectoris (HCC) 06/29/2021   CAD S/P percutaneous coronary angioplasty of OM1 06/28/2021   NSTEMI (non-ST elevated myocardial infarction) (HCC) 06/26/2021   Incisional hernia, without obstruction or gangrene 10/27/2020   Early satiety 05/28/2020   Panic disorder 12/11/2019   Flank pain 12/09/2019   Diastasis recti 04/11/2019   Intermittent upper abdominal pain 03/28/2019   MDD (major depressive disorder), recurrent episode, moderate (HCC) 11/26/2018   PTSD (post-traumatic stress disorder) 11/26/2018   Nausea and vomiting 02/14/2018   GERD (gastroesophageal reflux disease) 11/08/2017   Dysphagia 11/08/2017   Taking multiple medications for chronic disease 08/02/2017   Family history of colon cancer 08/02/2017   IBS (irritable bowel syndrome) 08/02/2017   Lateral epicondylitis 08/27/2009  JOINT PAIN, HAND 07/30/2009   TRIGGER FINGER 07/30/2009   CARPAL TUNNEL SYNDROME 03/04/2009   Brachial neuritis or radiculitis 03/04/2009   NUMBNESS, HAND 02/02/2009      ROS    Objective:     BP (!) 146/76   Pulse 97   Ht 5' 1 (1.549 m)   Wt  181 lb (82.1 kg)   SpO2 92%   BMI 34.20 kg/m  BP Readings from Last 3 Encounters:  09/10/24 110/74  09/06/24 (!) 146/76  08/02/24 104/63   Wt Readings from Last 3 Encounters:  09/10/24 185 lb 6.4 oz (84.1 kg)  09/09/24 181 lb (82.1 kg)  09/06/24 181 lb (82.1 kg)      Physical Exam Vitals and nursing note reviewed.  Constitutional:      Appearance: Normal appearance.  HENT:     Head: Normocephalic.  Eyes:     Extraocular Movements: Extraocular movements intact.     Pupils: Pupils are equal, round, and reactive to light.  Cardiovascular:     Rate and Rhythm: Normal rate and regular rhythm.  Pulmonary:     Effort: Pulmonary effort is normal.     Breath sounds: Normal breath sounds.  Musculoskeletal:     Cervical back: Normal range of motion and neck supple.     Lumbar back: Tenderness present. Decreased range of motion. Positive left straight leg raise test. Negative right straight leg raise test.  Neurological:     Mental Status: She is alert and oriented to person, place, and time.  Psychiatric:        Mood and Affect: Mood normal.        Thought Content: Thought content normal.       The ASCVD Risk score (Arnett DK, et al., 2019) failed to calculate for the following reasons:   Risk score cannot be calculated because patient has a medical history suggesting prior/existing ASCVD    Assessment & Plan:   Problem List Items Addressed This Visit       Respiratory   Chronic obstructive pulmonary disease, unspecified COPD type (HCC)   Stable with Atrovent  HFA.  Refills provided.       Relevant Medications   ipratropium (ATROVENT  HFA) 17 MCG/ACT inhaler     Endocrine   Type 2 diabetes mellitus with complication, with long-term current use of insulin  (HCC) - Primary (Chronic)   Add Lyrica for ongoing neuropathy      Relevant Medications   pregabalin (LYRICA) 75 MG capsule     Nervous and Auditory   Back pain of lumbar region with sciatica   Chronic low  back pain with sciatica causing discomfort and affecting sleep. Oxycodone  caused side effects. Physical therapy initiated but only one session attended. She prefers non-surgical management. - Prescribe hydrocodone  for pain management.  PDMP reviewed.   - Lyrica added for additional pain relief.  - Encourage continuation and scheduling of physical therapy sessions.      Relevant Medications   pregabalin (LYRICA) 75 MG capsule     Other   Restless leg syndrome   Stable with Mirapex . Refills provided.       Swelling   Continue with Lasix  for prn use.        Relevant Medications   furosemide  (LASIX ) 20 MG tablet   Other Relevant Orders   Basic Metabolic Panel (BMET) (Completed)   Other Visit Diagnoses       Encounter for immunization       Relevant Orders   Pneumococcal conjugate  vaccine 20-valent (Completed)   Flu vaccine trivalent PF, 6mos and older(Flulaval,Afluria,Fluarix,Fluzone) (Completed)     Assessment and Plan     Encounter for immunization Received flu and pneumonia vaccines.  Sciatica and chronic back pain Chronic sciatica and back pain likely due to spinal nerve compression. Severe and persistent pain. Lyrica prescribed for neuropathy and chronic pain. - Prescribed Lyrica for neuropathy and chronic back pain.  Edema of lower extremities Chronic lower extremity edema, worse in right leg, persistent since heart attack. Lasix  once daily was insufficient. - Increased Lasix  to twice daily. - Checked potassium level due to increased Lasix  dosage.  Restless legs syndrome Persistent symptoms despite current medication. Current dose ineffective. - Increased Mirapex  to two tablets at bedtime.  Type 2 diabetes mellitus Ongoing diabetes management. Concerns about insulin  regimen affecting blood sugar. Endocrinologist follow-up scheduled. Discussed potential use of Mounjaro or Ozempic due to heart issues and diabetes. - Discuss insulin  regimen with endocrinologist. -  Consider Mounjaro or Ozempic for diabetes management.       No follow-ups on file.    Leita Longs, FNP

## 2024-09-06 NOTE — Telephone Encounter (Signed)
 Pt c/o swelling/edema: STAT if pt has developed SOB within 24 hours  If swelling, where is the swelling located?   Both legs and feet  How much weight have you gained and in what time span?   Unknown  Have you gained 2 pounds in a day or 5 pounds in a week?   Unknown  Do you have a log of your daily weights (if so, list)?   No  Are you currently taking a fluid pill?   Yes  Are you currently SOB?   No  Have you traveled recently in a car or plane for an extended period of time?   No  Patient stated her legs and feet feel really tight and they hurt.  Patient stated she is very exhausted.

## 2024-09-06 NOTE — Telephone Encounter (Signed)
 Returned call to pt. No answer. Mailbox is full.

## 2024-09-07 LAB — BASIC METABOLIC PANEL WITH GFR
BUN/Creatinine Ratio: 17 (ref 12–28)
BUN: 17 mg/dL (ref 8–27)
CO2: 22 mmol/L (ref 20–29)
Calcium: 9.6 mg/dL (ref 8.7–10.3)
Chloride: 105 mmol/L (ref 96–106)
Creatinine, Ser: 1.03 mg/dL — ABNORMAL HIGH (ref 0.57–1.00)
Glucose: 194 mg/dL — ABNORMAL HIGH (ref 70–99)
Potassium: 4.2 mmol/L (ref 3.5–5.2)
Sodium: 141 mmol/L (ref 134–144)
eGFR: 61 mL/min/1.73 (ref >59)

## 2024-09-09 ENCOUNTER — Ambulatory Visit

## 2024-09-09 VITALS — Ht 61.0 in | Wt 181.0 lb

## 2024-09-09 DIAGNOSIS — F17210 Nicotine dependence, cigarettes, uncomplicated: Secondary | ICD-10-CM

## 2024-09-09 DIAGNOSIS — Z0001 Encounter for general adult medical examination with abnormal findings: Secondary | ICD-10-CM

## 2024-09-09 DIAGNOSIS — Z780 Asymptomatic menopausal state: Secondary | ICD-10-CM

## 2024-09-09 DIAGNOSIS — Z1231 Encounter for screening mammogram for malignant neoplasm of breast: Secondary | ICD-10-CM

## 2024-09-09 DIAGNOSIS — Z0000 Encounter for general adult medical examination without abnormal findings: Secondary | ICD-10-CM

## 2024-09-09 DIAGNOSIS — E118 Type 2 diabetes mellitus with unspecified complications: Secondary | ICD-10-CM

## 2024-09-09 NOTE — Patient Instructions (Signed)
 Katelyn Kelly,  Thank you for taking the time for your Medicare Wellness Visit. I appreciate your continued commitment to your health goals. Please review the care plan we discussed, and feel free to reach out if I can assist you further.  Please note that Annual Wellness Visits do not include a physical exam. Some assessments may be limited, especially if the visit was conducted virtually. If needed, we may recommend an in-person follow-up with your provider.  Ongoing Care Seeing your primary care provider every 3 to 6 months helps us  monitor your health and provide consistent, personalized care.   Referrals If a referral was made during today's visit and you haven't received any updates within two weeks, please contact the referred provider directly to check on the status.  Lung Cancer Screening-[place] Office [address]-First Floor Medical Building directly across from [facility] Phone Number:[phone]  Podiatrist Referral [facility] ([place]) [address], [place] [address] Main Phone:[phone]  Mammogram/Bone Density Screening: Call [facility] @ Phone: [phone]   Recommended Screenings:  Health Maintenance  Topic Date Due   COVID-19 Vaccine (1) Never done   Complete foot exam   Never done   Eye exam for diabetics  Never done   Pap with HPV screening  Never done   Zoster (Shingles) Vaccine (2 of 2) 11/25/2020   Breast Cancer Screening  08/18/2022   Screening for Lung Cancer  03/12/2024   DEXA scan (bone density measurement)  Never done   Yearly kidney health urinalysis for diabetes  01/10/2025*   Hemoglobin A1C  12/06/2024   DTaP/Tdap/Td vaccine (2 - Td or Tdap) 07/05/2025   Yearly kidney function blood test for diabetes  09/06/2025   Medicare Annual Wellness Visit  09/09/2025   Colon Cancer Screening  09/06/2027   Pneumococcal Vaccine for age over 39  Completed   Flu Shot  Completed   Hepatitis C  Screening  Completed   HIV Screening  Completed   Hepatitis B Vaccine  Aged Out   Meningitis B Vaccine  Aged Out  *Topic was postponed. The date shown is not the original due date.       09/09/2024    9:23 AM  Advanced Directives  Does Patient Have a Medical Advance Directive? No  Would patient like information on creating a medical advance directive? No - Patient declined    Vision: Annual vision screenings are recommended for early detection of glaucoma, cataracts, and diabetic retinopathy. These exams can also reveal signs of chronic conditions such as diabetes and high blood pressure.  Dental: Annual dental screenings help detect early signs of oral cancer, gum disease, and other conditions linked to overall health, including heart disease and diabetes.  Please see the attached documents for additional preventive care recommendations.

## 2024-09-09 NOTE — Progress Notes (Signed)
 Subjective:   Katelyn Kelly is a 63 y.o. female who presents for a Medicare Annual Wellness Visit.  Allergies (verified) Meprobamate, Soliqua [insulin  glargine-lixisenatide], Tranxene [clorazepate dipotassium], Albuterol , Doxepin hcl, Haloperidol, Sertraline, Vortioxetine, and Elavil [amitriptyline]   History: Past Medical History:  Diagnosis Date   Anxiety and depression    Bilateral carpal tunnel syndrome    Chronic back pain    Chronic neck pain    Depression    Diabetes mellitus    GERD (gastroesophageal reflux disease)    H/O syncope    Headache    History of panic attacks    Hypertension    IBS (irritable bowel syndrome)    Manic depression (HCC)    Migraine    Panic attack    Past Surgical History:  Procedure Laterality Date   ABDOMINAL HYSTERECTOMY     ABDOMINAL SURGERY     APPENDECTOMY     BIOPSY  09/05/2017   Procedure: BIOPSY;  Surgeon: Harvey Margo CROME, MD;  Location: [facility];  Service: Endoscopy;;  random colon   BIOPSY  05/01/2018   Procedure: BIOPSY;  Surgeon: Harvey Margo CROME, MD;  Location: [facility];  Service: Endoscopy;;  gastric biopsy   CARPAL TUNNEL RELEASE Bilateral    CHOLECYSTECTOMY     COLONOSCOPY WITH PROPOFOL  N/A 09/05/2017   Procedure: COLONOSCOPY WITH PROPOFOL ;  Surgeon: Harvey Margo CROME, MD; normal TI, 5 small polyps, diverticulosis in the cecum, internal and external hemorrhoids.  Random colon biopsies benign.  Colon polyps were hyperplastic.  Repeat in 2023.   CORONARY BALLOON ANGIOPLASTY N/A 06/28/2021   Procedure: CORONARY BALLOON ANGIOPLASTY;  Surgeon: Wonda Sharper, MD;  Location: [facility] INVASIVE CV LAB;  Service: Cardiovascular;  Laterality: N/A;   DILATION AND CURETTAGE OF UTERUS     ESOPHAGOGASTRODUODENOSCOPY (EGD) WITH PROPOFOL  N/A 05/01/2018   Procedure: ESOPHAGOGASTRODUODENOSCOPY (EGD) WITH PROPOFOL ;  Surgeon: Harvey Margo CROME, MD;  normal esophagus, small hiatal hernia, mild gastritis s/p biopsy, normal duodenum.  No H.  pylori.    HERNIA REPAIR     INCISIONAL HERNIA REPAIR N/A 11/06/2020   Procedure: HERNIA REPAIR INCISIONAL, PRIMARY REPAIR;  Surgeon: Kallie Manuelita BROCKS, MD;  Location: [facility];  Service: General;  Laterality: N/A;   LEFT HEART CATH AND CORONARY ANGIOGRAPHY N/A 06/28/2021   Procedure: LEFT HEART CATH AND CORONARY ANGIOGRAPHY;  Surgeon: Wonda Sharper, MD;  Location: [facility] INVASIVE CV LAB;  Service: Cardiovascular;  Laterality: N/A;   POLYPECTOMY  09/05/2017   Procedure: POLYPECTOMY;  Surgeon: Harvey Margo CROME, MD;  Location: [facility];  Service: Endoscopy;;  sigmoid and rectal   TUBAL LIGATION     Family History  Problem Relation Age of Onset   Diabetes Mother    Stroke Mother    Depression Mother    Lung cancer Mother    Colon cancer Father 62       Passed away from colon cancer   Colon cancer Brother 74       Passed away from colon cancer   Bipolar disorder Son    Social History   Occupational History   Not on file  Tobacco Use   Smoking status: Every Day    Current packs/day: 1.00    Average packs/day: 1 pack/day for 49.9 years (49.9 ttl pk-yrs)    Types: Cigarettes    Start date: 1976   Smokeless tobacco: Never   Tobacco comments:    one pack a day. Pt has purchased nicotine  patches. She has a plan to  stop and is going to set a quit date.   Vaping Use   Vaping status: Never Used  Substance and Sexual Activity   Alcohol use: No   Drug use: No   Sexual activity: Not Currently    Birth control/protection: Surgical   Tobacco Counseling Ready to quit: Yes Counseling given: Yes Tobacco comments: one pack a day. Pt has purchased nicotine  patches. She has a plan to stop and is going to set a quit date.   SDOH Screenings   Food Insecurity: No Food Insecurity (09/09/2024)  Housing: Low Risk  (09/09/2024)  Transportation Needs: No Transportation Needs (09/09/2024)  Utilities: Not At Risk (09/09/2024)  Depression (PHQ2-9): Low Risk  (09/09/2024)  Recent Concern:  Depression (PHQ2-9) - High Risk (06/26/2024)  Physical Activity: Insufficiently Active (09/09/2024)  Social Connections: Patient Declined (09/09/2024)  Stress: No Stress Concern Present (09/09/2024)  Tobacco Use: High Risk (09/09/2024)  Health Literacy: Adequate Health Literacy (09/09/2024)   Depression Screen    09/09/2024   12:41 PM 06/26/2024    1:02 PM 06/07/2024    1:09 PM 03/26/2024    1:18 PM 12/16/2021    3:13 PM 09/16/2021    1:09 PM  PHQ 2/9 Scores  PHQ - 2 Score 0 6 6 6 6 6   PHQ- 9 Score  22  22  17  21  19       Data saved with a previous flowsheet row definition     Goals Addressed               This Visit's Progress     I'd like to lose weight (pt-stated)         Visit info / Clinical Intake: Medicare Wellness Visit Type:: Subsequent Annual Wellness Visit Persons participating in visit:: patient Medicare Wellness Visit Mode:: Video Because this visit was a virtual/telehealth visit:: pt reported vitals Information given by:: patient Interpreter Needed?: No Pre-visit prep was completed: yes AWV questionnaire completed by patient prior to visit?: no Living arrangements:: with family/others Typical amount of pain: some Does pain affect daily life?: (!) yes Are you currently prescribed opioids?: (!) yes  Dietary Habits and Nutritional Risks How many meals a day?: 3 Eats fruit and vegetables daily?: yes Most meals are obtained by: preparing own meals In the last 2 weeks, have you had any of the following?: none Diabetic:: (!) yes Any non-healing wounds?: no How often do you check your BS?: 4 Would you like to be referred to a Nutritionist or for Diabetic Management? : no  Functional Status Activities of Daily Living (to include ambulation/medication): Independent Ambulation: Independent Medication Administration: Independent Home Management: Independent Manage your own finances?: yes Primary transportation is: driving Concerns about vision?: no  *vision screening is required for WTM* Concerns about hearing?: no  Fall Screening Falls in the past year?: 0 Number of falls in past year: 0 Was there an injury with Fall?: 0 Fall Risk Category Calculator: 0 Patient Fall Risk Level: Low Fall Risk  Fall Risk Patient at Risk for Falls Due to: No Fall Risks Fall risk Follow up: Falls evaluation completed; Education provided; Falls prevention discussed  Home and Transportation Safety: All rugs have non-skid backing?: yes All stairs or steps have railings?: yes Have non-skid surface in bathtub or shower?: yes Good home lighting?: yes Regular seat belt use?: yes Hospital stays in the last year:: no  Cognitive Assessment Difficulty concentrating, remembering, or making decisions? : no Will 6CIT or Mini Cog be Completed: no 6CIT or Mini  Cog Declined: patient alert, oriented, able to answer questions appropriately and recall recent events  Advance Directives (For Healthcare) Does Patient Have a Medical Advance Directive?: No Would patient like information on creating a medical advance directive?: No - Patient declined  Reviewed/Updated  Reviewed/Updated: Reviewed All (Medical, Surgical, Family, Medications, Allergies, Care Teams, Patient Goals)        Objective:    Today's Vitals   09/09/24 0912  Weight: 181 lb (82.1 kg)  Height: 5' 1 (1.549 m)   Body mass index is 34.2 kg/m.  Current Medications (verified) Outpatient Encounter Medications as of 09/09/2024  Medication Sig   ALPRAZolam  (XANAX ) 1 MG tablet Take 0.5-1 tablets (0.5-1 mg total) by mouth 3 (three) times daily as needed for anxiety or sleep.   aspirin  EC 81 MG tablet Take 1 tablet (81 mg total) by mouth daily. Swallow whole.   atorvastatin  (LIPITOR ) 40 MG tablet Take 1 tablet (40 mg total) by mouth daily.   busPIRone  (BUSPAR ) 7.5 MG tablet Take by mouth See admin instructions. Take 0.5 tablets (3.75 mg total) by mouth 3 times daily for 7 days, THEN 1 tablet  (7.5 mg total) 3 times daily for 7 days, THEN 2 tablets (15 mg total) 3 times daily   busPIRone  (BUSPAR ) 7.5 MG tablet Take 1 tablet by mouth 3 (three) times daily as needed.   Continuous Glucose Sensor (FREESTYLE LIBRE 3 PLUS SENSOR) MISC Change sensor every 15 days.   Fremanezumab -vfrm (AJOVY ) 225 MG/1.5ML SOAJ Inject 225 mg into the skin every 30 (thirty) days.   furosemide  (LASIX ) 20 MG tablet Take 1 tablet (20 mg total) by mouth 2 (two) times daily.   glucose blood (ACCU-CHEK GUIDE TEST) test strip Use as instructed to monitor glucose 4 times daily   HYDROcodone -acetaminophen  (NORCO) 7.5-325 MG tablet Take 1 tablet by mouth every 6 (six) hours as needed for moderate pain (pain score 4-6).   insulin  degludec (TRESIBA ) 100 UNIT/ML FlexTouch Pen Inject 25 Units into the skin at bedtime. Inject 30 units daily at bedtime as instructed by the provider.   insulin  lispro (HUMALOG ) 100 UNIT/ML KwikPen Inject 5-11 Units into the skin 3 (three) times daily. Patient is to inject 5-11 units per sliding scale as directed by the provider.   ipratropium (ATROVENT  HFA) 17 MCG/ACT inhaler Inhale 2 puffs into the lungs every 6 (six) hours as needed for wheezing.   isosorbide  mononitrate (IMDUR ) 60 MG 24 hr tablet Take 1 tablet (60 mg total) by mouth daily.   linaclotide  (LINZESS ) 290 MCG CAPS capsule Take 1 capsule (290 mcg total) by mouth daily.   linaclotide  (LINZESS ) 290 MCG CAPS capsule Take 1 capsule (290 mcg total) by mouth daily before breakfast.   Menthol, Topical Analgesic, (BIOFREEZE) 10 % LIQD Apply 1 application topically daily as needed (pain).   methocarbamol  (ROBAXIN ) 750 MG tablet Take 1-2 tablets (750-1,500 mg total) by mouth at bedtime.   metoprolol  tartrate (LOPRESSOR ) 25 MG tablet TAKE (1/2) TABLET BY MOUTH TWICE DAILY   nitroGLYCERIN  (NITROSTAT ) 0.4 MG SL tablet PLACE ONE TABLET UNDER THE TOUNGE EVERY FIVE MINUTES FOR 3 DOSES AS NEEDED FOR CHEST PAINS.   NURTEC 75 MG TBDP TAKE ONE TABLET  DAILY AS NEEDED.   ondansetron  (ZOFRAN -ODT) 4 MG disintegrating tablet Take 1 tablet (4 mg total) by mouth every 8 (eight) hours as needed for nausea or vomiting.   pramipexole  (MIRAPEX ) 0.25 MG tablet Take 0.25 mg by mouth at bedtime.   pramipexole  (MIRAPEX ) 0.5 MG tablet Take 1 tablet (  0.5 mg total) by mouth at bedtime.   pregabalin (LYRICA) 75 MG capsule Take 1 capsule (75 mg total) by mouth 2 (two) times daily.   SURE COMFORT PEN NEEDLES 32G X 4 MM MISC Use one test strip to check blood sugars 4 times a day as instructed by the provider.   No facility-administered encounter medications on file as of 09/09/2024.   Hearing/Vision screen Hearing Screening - Comments:: Patient denies any hearing difficulties.   Vision Screening - Comments:: Patient is not up to date on yearly eye exams.  A list of eye care providers were provided to the patient  Immunizations and Health Maintenance Health Maintenance  Topic Date Due   COVID-19 Vaccine (1) Never done   FOOT EXAM  Never done   OPHTHALMOLOGY EXAM  Never done   Cervical Cancer Screening (HPV/Pap Cotest)  Never done   Zoster Vaccines- Shingrix (2 of 2) 11/25/2020   Mammogram  08/18/2022   Lung Cancer Screening  03/12/2024   DEXA SCAN  Never done   Diabetic kidney evaluation - Urine ACR  01/10/2025 (Originally 09/08/1979)   HEMOGLOBIN A1C  12/06/2024   DTaP/Tdap/Td (2 - Td or Tdap) 07/05/2025   Diabetic kidney evaluation - eGFR measurement  09/06/2025   Medicare Annual Wellness (AWV)  09/09/2025   Colonoscopy  09/06/2027   Pneumococcal Vaccine: 50+ Years  Completed   Influenza Vaccine  Completed   Hepatitis C Screening  Completed   HIV Screening  Completed   Hepatitis B Vaccines 19-59 Average Risk  Aged Out   Meningococcal B Vaccine  Aged Out        Assessment/Plan:  This is a routine wellness examination for Alverda.  Patient Care Team: [name], FNP as PCP - General (Family Medicine) Alvan, Dorn FALCON, MD as  Consulting Physician (Cardiology) Therisa Benton PARAS, NP as Nurse Practitioner (Endocrinology) Darroll Anes, DO (Optometry) Whitfield Raisin, NP as Nurse Practitioner (Neurology) Ezzard Sonny GORMAN DEVONNA as Physician Assistant (Gastroenterology) Gillie Duncans, MD as Consulting Physician (Neurosurgery)  I have personally reviewed and noted the following in the patient's chart:   Medical and social history Use of alcohol, tobacco or illicit drugs  Current medications and supplements including opioid prescriptions. Functional ability and status Nutritional status Physical activity Advanced directives List of other physicians Hospitalizations, surgeries, and ER visits in previous 12 months Vitals Screenings to include cognitive, depression, and falls Referrals and appointments  Orders Placed This Encounter  Procedures   DG Bone Density    Standing Status:   Future    Expiration Date:   09/09/2025    Reason for Exam (SYMPTOM  OR DIAGNOSIS REQUIRED):   post menopausal estrogen deficient    Preferred imaging location?:   [facility]   MM 3D SCREENING MAMMOGRAM BILATERAL BREAST    Standing Status:   Future    Expiration Date:   09/09/2025    Reason for Exam (SYMPTOM  OR DIAGNOSIS REQUIRED):   breast cancer screening    Preferred imaging location?:   [facility]   Ambulatory Referral for Lung Cancer Scre    Referral Priority:   Routine    Referral Type:   Consultation    Referral Reason:   Specialty Services Required    Number of Visits Requested:   1   Ambulatory referral to Podiatry    Referral Priority:   Routine    Referral Type:   Consultation    Referral Reason:   Specialty Services Required    Referred  to Provider:   Blinda Katz, DPM    Requested Specialty:   Podiatry    Number of Visits Requested:   1   In addition, I have reviewed and discussed with patient certain preventive protocols, quality metrics, and best practice recommendations. A written  personalized care plan for preventive services as well as general preventive health recommendations were provided to patient.   Makaveli Hoard, CMA   09/09/2024   Return on September 10, 2025 at 8:40 am for your Medicare Wellness Visit.  After Visit Summary: (Mail) Due to this being a telephonic visit, the after visit summary with patients personalized plan was offered to patient via mail   Nurse Notes:

## 2024-09-10 ENCOUNTER — Ambulatory Visit (INDEPENDENT_AMBULATORY_CARE_PROVIDER_SITE_OTHER): Admitting: Nurse Practitioner

## 2024-09-10 ENCOUNTER — Encounter: Payer: Self-pay | Admitting: Nurse Practitioner

## 2024-09-10 ENCOUNTER — Telehealth: Payer: Self-pay | Admitting: *Deleted

## 2024-09-10 VITALS — BP 110/74 | HR 86 | Ht 61.0 in | Wt 185.4 lb

## 2024-09-10 DIAGNOSIS — Z794 Long term (current) use of insulin: Secondary | ICD-10-CM | POA: Diagnosis not present

## 2024-09-10 DIAGNOSIS — E1165 Type 2 diabetes mellitus with hyperglycemia: Secondary | ICD-10-CM | POA: Diagnosis not present

## 2024-09-10 DIAGNOSIS — E782 Mixed hyperlipidemia: Secondary | ICD-10-CM | POA: Diagnosis not present

## 2024-09-10 DIAGNOSIS — I10 Essential (primary) hypertension: Secondary | ICD-10-CM

## 2024-09-10 LAB — POCT GLYCOSYLATED HEMOGLOBIN (HGB A1C): Hemoglobin A1C: 9 % — AB (ref 4.0–5.6)

## 2024-09-10 MED ORDER — INSULIN DEGLUDEC 100 UNIT/ML ~~LOC~~ SOPN: 30.0000 [IU] | PEN_INJECTOR | Freq: Every day | SUBCUTANEOUS | 3 refills | Status: AC

## 2024-09-10 MED ORDER — SURE COMFORT PEN NEEDLES 32G X 4 MM MISC: 2 refills | Status: AC

## 2024-09-10 MED ORDER — INSULIN LISPRO (1 UNIT DIAL) 100 UNIT/ML (KWIKPEN)
8.0000 [IU] | PEN_INJECTOR | Freq: Three times a day (TID) | SUBCUTANEOUS | 3 refills | Status: AC
Start: 2024-09-10 — End: ?

## 2024-09-10 MED ORDER — ACCU-CHEK GUIDE TEST VI STRP: ORAL_STRIP | 12 refills | Status: AC

## 2024-09-10 MED ORDER — FREESTYLE LIBRE 3 PLUS SENSOR MISC: 3 refills | Status: AC

## 2024-09-10 NOTE — Telephone Encounter (Signed)
 Patient left a message that when seen at her office visit today her sliding scale insulin  was increased. She injected 9 units at 9:05 am. She became extremely dizzy. Patient was questioning if it could have been the adjustment with the insulin  or the mixing of the insulin  and the Lyrica.  Talked with Whitney Reardon,NP she advises that this is not a side effect of the insulin , and feels that the patient should follow with the prescribing provider of the Lyrica.  Patient was called and made aware. She states that she is waiting for a call back form them.

## 2024-09-10 NOTE — Progress Notes (Signed)
 Endocrinology Follow Up Note       09/10/2024, 8:27 AM   Subjective:    Patient ID: Katelyn Kelly, female    DOB: [birth year].  Katelyn Kelly is being seen in follow up after being seen in consultation for management of currently uncontrolled symptomatic diabetes requested by  [name], FNP.   Past Medical History:  Diagnosis Date   Anxiety and depression    Bilateral carpal tunnel syndrome    Chronic back pain    Chronic neck pain    Depression    Diabetes mellitus    GERD (gastroesophageal reflux disease)    H/O syncope    Headache    History of panic attacks    Hypertension    IBS (irritable bowel syndrome)    Manic depression (HCC)    Migraine    Panic attack     Past Surgical History:  Procedure Laterality Date   ABDOMINAL HYSTERECTOMY     ABDOMINAL SURGERY     APPENDECTOMY     BIOPSY  09/05/2017   Procedure: BIOPSY;  Surgeon: Harvey Margo CROME, MD;  Location: [facility];  Service: Endoscopy;;  random colon   BIOPSY  05/01/2018   Procedure: BIOPSY;  Surgeon: Harvey Margo CROME, MD;  Location: [facility];  Service: Endoscopy;;  gastric biopsy   CARPAL TUNNEL RELEASE Bilateral    CHOLECYSTECTOMY     COLONOSCOPY WITH PROPOFOL  N/A 09/05/2017   Procedure: COLONOSCOPY WITH PROPOFOL ;  Surgeon: Harvey Margo CROME, MD; normal TI, 5 small polyps, diverticulosis in the cecum, internal and external hemorrhoids.  Random colon biopsies benign.  Colon polyps were hyperplastic.  Repeat in 2023.   CORONARY BALLOON ANGIOPLASTY N/A 06/28/2021   Procedure: CORONARY BALLOON ANGIOPLASTY;  Surgeon: Wonda Sharper, MD;  Location: [facility] INVASIVE CV LAB;  Service: Cardiovascular;  Laterality: N/A;   DILATION AND CURETTAGE OF UTERUS     ESOPHAGOGASTRODUODENOSCOPY (EGD) WITH PROPOFOL  N/A 05/01/2018   Procedure: ESOPHAGOGASTRODUODENOSCOPY (EGD) WITH PROPOFOL ;  Surgeon: Harvey Margo CROME, MD;  normal esophagus, small  hiatal hernia, mild gastritis s/p biopsy, normal duodenum.  No H. pylori.    HERNIA REPAIR     INCISIONAL HERNIA REPAIR N/A 11/06/2020   Procedure: HERNIA REPAIR INCISIONAL, PRIMARY REPAIR;  Surgeon: Kallie Manuelita BROCKS, MD;  Location: [facility];  Service: General;  Laterality: N/A;   LEFT HEART CATH AND CORONARY ANGIOGRAPHY N/A 06/28/2021   Procedure: LEFT HEART CATH AND CORONARY ANGIOGRAPHY;  Surgeon: Wonda Sharper, MD;  Location: [facility] INVASIVE CV LAB;  Service: Cardiovascular;  Laterality: N/A;   POLYPECTOMY  09/05/2017   Procedure: POLYPECTOMY;  Surgeon: Harvey Margo CROME, MD;  Location: [facility];  Service: Endoscopy;;  sigmoid and rectal   TUBAL LIGATION      Social History   Socioeconomic History   Marital status: Legally Separated    Spouse name: Not on file   Number of children: Not on file   Years of education: Not on file   Highest education level: Not on file  Occupational History   Not on file  Tobacco Use   Smoking status: Every Day    Current packs/day: 1.00    Average packs/day: 1 pack/day  for 49.9 years (49.9 ttl pk-yrs)    Types: Cigarettes    Start date: 1976   Smokeless tobacco: Never   Tobacco comments:    one pack a day. Pt has purchased nicotine  patches. She has a plan to stop and is going to set a quit date.   Vaping Use   Vaping status: Never Used  Substance and Sexual Activity   Alcohol use: No   Drug use: No   Sexual activity: Not Currently    Birth control/protection: Surgical  Other Topics Concern   Not on file  Social History Narrative   Not on file   Social Drivers of Health   Financial Resource Strain: Not on file  Food Insecurity: No Food Insecurity (09/09/2024)   Hunger Vital Sign    Worried About Running Out of Food in the Last Year: Never true    Ran Out of Food in the Last Year: Never true  Transportation Needs: No Transportation Needs (09/09/2024)   PRAPARE - Administrator, Civil Service (Medical): No    Lack of  Transportation (Non-Medical): No  Physical Activity: Insufficiently Active (09/09/2024)   Exercise Vital Sign    Days of Exercise per Week: 7 days    Minutes of Exercise per Session: 20 min  Stress: No Stress Concern Present (09/09/2024)   [employer] of Occupational Health - Occupational Stress Questionnaire    Feeling of Stress: Not at all  Social Connections: Patient Declined (09/09/2024)   Social Connection and Isolation Panel    Frequency of Communication with Friends and Family: Patient declined    Frequency of Social Gatherings with Friends and Family: Patient declined    Attends Religious Services: Patient declined    Database Administrator or Organizations: Patient declined    Attends Banker Meetings: Patient declined    Marital Status: Patient declined    Family History  Problem Relation Age of Onset   Diabetes Mother    Stroke Mother    Depression Mother    Lung cancer Mother    Colon cancer Father 38       Passed away from colon cancer   Colon cancer Brother 44       Passed away from colon cancer   Bipolar disorder Son     Outpatient Encounter Medications as of 09/10/2024  Medication Sig   ALPRAZolam  (XANAX ) 1 MG tablet Take 0.5-1 tablets (0.5-1 mg total) by mouth 3 (three) times daily as needed for anxiety or sleep.   aspirin  EC 81 MG tablet Take 1 tablet (81 mg total) by mouth daily. Swallow whole.   atorvastatin  (LIPITOR ) 40 MG tablet Take 1 tablet (40 mg total) by mouth daily.   busPIRone  (BUSPAR ) 7.5 MG tablet Take by mouth See admin instructions. Take 0.5 tablets (3.75 mg total) by mouth 3 times daily for 7 days, THEN 1 tablet (7.5 mg total) 3 times daily for 7 days, THEN 2 tablets (15 mg total) 3 times daily   busPIRone  (BUSPAR ) 7.5 MG tablet Take 1 tablet by mouth 3 (three) times daily as needed.   Fremanezumab -vfrm (AJOVY ) 225 MG/1.5ML SOAJ Inject 225 mg into the skin every 30 (thirty) days.   furosemide  (LASIX ) 20 MG tablet Take 1  tablet (20 mg total) by mouth 2 (two) times daily.   HYDROcodone -acetaminophen  (NORCO) 7.5-325 MG tablet Take 1 tablet by mouth every 6 (six) hours as needed for moderate pain (pain score 4-6).   ipratropium (ATROVENT  HFA) 17 MCG/ACT inhaler  Inhale 2 puffs into the lungs every 6 (six) hours as needed for wheezing.   isosorbide  mononitrate (IMDUR ) 60 MG 24 hr tablet Take 1 tablet (60 mg total) by mouth daily.   linaclotide  (LINZESS ) 290 MCG CAPS capsule Take 1 capsule (290 mcg total) by mouth daily.   linaclotide  (LINZESS ) 290 MCG CAPS capsule Take 1 capsule (290 mcg total) by mouth daily before breakfast.   Menthol, Topical Analgesic, (BIOFREEZE) 10 % LIQD Apply 1 application topically daily as needed (pain).   methocarbamol  (ROBAXIN ) 750 MG tablet Take 1-2 tablets (750-1,500 mg total) by mouth at bedtime.   metoprolol  tartrate (LOPRESSOR ) 25 MG tablet TAKE (1/2) TABLET BY MOUTH TWICE DAILY   nitroGLYCERIN  (NITROSTAT ) 0.4 MG SL tablet PLACE ONE TABLET UNDER THE TOUNGE EVERY FIVE MINUTES FOR 3 DOSES AS NEEDED FOR CHEST PAINS. (Patient taking differently: As needed)   NURTEC 75 MG TBDP TAKE ONE TABLET DAILY AS NEEDED.   ondansetron  (ZOFRAN -ODT) 4 MG disintegrating tablet Take 1 tablet (4 mg total) by mouth every 8 (eight) hours as needed for nausea or vomiting.   pramipexole  (MIRAPEX ) 0.25 MG tablet Take 0.25 mg by mouth at bedtime.   [DISCONTINUED] Continuous Glucose Sensor (FREESTYLE LIBRE 3 PLUS SENSOR) MISC Change sensor every 15 days.   [DISCONTINUED] glucose blood (ACCU-CHEK GUIDE TEST) test strip Use as instructed to monitor glucose 4 times daily   [DISCONTINUED] insulin  degludec (TRESIBA ) 100 UNIT/ML FlexTouch Pen Inject 25 Units into the skin at bedtime. Inject 30 units daily at bedtime as instructed by the provider. (Patient taking differently: Inject 24 Units into the skin at bedtime. Inject 30 units daily at bedtime as instructed by the provider.)   [DISCONTINUED] insulin  lispro (HUMALOG )  100 UNIT/ML KwikPen Inject 5-11 Units into the skin 3 (three) times daily. Patient is to inject 5-11 units per sliding scale as directed by the provider.   [DISCONTINUED] SURE COMFORT PEN NEEDLES 32G X 4 MM MISC Use one test strip to check blood sugars 4 times a day as instructed by the provider.   Continuous Glucose Sensor (FREESTYLE LIBRE 3 PLUS SENSOR) MISC Change sensor every 15 days.   glucose blood (ACCU-CHEK GUIDE TEST) test strip Use as instructed to monitor glucose 4 times daily   insulin  degludec (TRESIBA ) 100 UNIT/ML FlexTouch Pen Inject 30 Units into the skin at bedtime. Inject 30 units daily at bedtime as instructed by the provider.   insulin  lispro (HUMALOG ) 100 UNIT/ML KwikPen Inject 8-14 Units into the skin 3 (three) times daily. Patient is to inject 5-11 units per sliding scale as directed by the provider.   pramipexole  (MIRAPEX ) 0.5 MG tablet Take 1 tablet (0.5 mg total) by mouth at bedtime. (Patient not taking: Reported on 09/10/2024)   pregabalin (LYRICA) 75 MG capsule Take 1 capsule (75 mg total) by mouth 2 (two) times daily. (Patient not taking: Reported on 09/10/2024)   SURE COMFORT PEN NEEDLES 32G X 4 MM MISC Use one test strip to check blood sugars 4 times a day as instructed by the provider.   No facility-administered encounter medications on file as of 09/10/2024.    ALLERGIES: Allergies  Allergen Reactions   Meprobamate Hives and Swelling   Soliqua [Insulin  Glargine-Lixisenatide] Other (See Comments)    Flu like symptoms , Shortness of breathe   Tranxene [Clorazepate Dipotassium] Hives and Swelling   Albuterol  Other (See Comments)    Patient does not remember the reaction. This was a record provided via [facility]   Doxepin Hcl Other (See  Comments)   Haloperidol Other (See Comments)    Patient does not remember the reaction. This was a record provided via [facility]   Sertraline Other (See Comments)    Patient does not remember the reaction. This was  a record provided via [facility]   Vortioxetine Other (See Comments)    Patient does not remember the reaction. This was a record provided via [facility]   Elavil [Amitriptyline] Palpitations    VACCINATION STATUS: Immunization History  Administered Date(s) Administered   Influenza, Seasonal, Injecte, Preservative Fre 09/06/2024   PNEUMOCOCCAL CONJUGATE-20 09/06/2024   Tdap 07/06/2015    Diabetes She presents for her follow-up diabetic visit. She has type 2 diabetes mellitus. Onset time: diagnosed at approx age of 37. Her disease course has been fluctuating. There are no hypoglycemic associated symptoms. There are no diabetic associated symptoms. There are no hypoglycemic complications. Symptoms are stable. 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 Risk factors for coronary artery disease include diabetes mellitus, dyslipidemia, family history, hypertension, tobacco exposure and post-menopausal. Current diabetic treatment includes intensive insulin  program. She is compliant with treatment most of the time. Her weight is fluctuating minimally. She is following a generally unhealthy diet. When asked about meal planning, she reported none. She has not had a previous visit with a dietitian. She participates in exercise intermittently. Her home blood glucose trend is fluctuating minimally. Her overall blood glucose range is 180-200 mg/dl. (She presents today with her CGM showing slightly above target glycemic profile overall.  Her POCT A1c today is 9%, increasing from last visit of 8.1%.  She does note she has been under more stress recently and pain.  Analysis of her CGM shows TIR 45%, TAR 55%, TBR 0% with a GMI of 8%.  She thinks her insulin  isn't working anymore.) An ACE inhibitor/angiotensin II receptor blocker is not being taken. She does not see a podiatrist.Eye exam is current.     Review of systems  Constitutional: +  Minimally fluctuating body weight,  current Body mass index is 35.03 kg/m. , no fatigue, no subjective hyperthermia, no subjective hypothermia Eyes: no blurry vision, no xerophthalmia ENT: no sore throat, no nodules palpated in throat, no dysphagia/odynophagia, no hoarseness Cardiovascular: no chest pain, no shortness of breath, no palpitations, no leg swelling Respiratory: no cough, no shortness of breath Gastrointestinal: no nausea/vomiting/diarrhea Musculoskeletal: chronic back pain Skin: no rashes, no hyperemia Neurological: no tremors, no numbness, no tingling, no dizziness Psychiatric: no depression, no anxiety  Objective:     BP 110/74 (BP Location: Right Arm, Patient Position: Sitting, Cuff Size: Large)   Pulse 86   Ht 5' 1 (1.549 m)   Wt 185 lb 6.4 oz (84.1 kg)   BMI 35.03 kg/m   Wt Readings from Last 3 Encounters:  09/10/24 185 lb 6.4 oz (84.1 kg)  09/09/24 181 lb (82.1 kg)  09/06/24 181 lb (82.1 kg)     BP Readings from Last 3 Encounters:  09/10/24 110/74  09/06/24 (!) 146/76  08/02/24 104/63       Physical Exam- Limited  Constitutional:  Body mass index is 35.03 kg/m. , not in acute distress, normal state of mind Eyes:  EOMI, no exophthalmos Musculoskeletal: no gross deformities, strength intact in all four extremities, no gross restriction of joint movements Skin:  no rashes, no hyperemia Neurological: no tremor with outstretched hands   Diabetic Foot Exam - Simple   Simple Foot Form Diabetic Foot exam was performed with  the following findings: Yes 09/10/2024  8:20 AM  Visual Inspection See comments: Yes Sensation Testing Intact to touch and monofilament testing bilaterally: Yes Pulse Check Posterior Tibialis and Dorsalis pulse intact bilaterally: Yes Comments Dry callused skin bilaterally, onychomycosis bilaterally      CMP ( most recent) CMP     Component Value Date/Time   NA 141 09/06/2024 1601   K 4.2 09/06/2024 1601   CL 105  09/06/2024 1601   CO2 22 09/06/2024 1601   GLUCOSE 194 (H) 09/06/2024 1601   GLUCOSE 157 (H) 08/16/2023 0834   BUN 17 09/06/2024 1601   CREATININE 1.03 (H) 09/06/2024 1601   CALCIUM  9.6 09/06/2024 1601   PROT 7.3 02/05/2024 0826   ALBUMIN 3.7 02/05/2024 0826   AST 21 02/05/2024 0826   ALT 30 02/05/2024 0826   ALKPHOS 91 02/05/2024 0826   BILITOT 0.5 02/05/2024 0826   EGFR 61 09/06/2024 1601   GFRNONAA 58 (L) 08/16/2023 0834     Diabetic Labs (most recent): Lab Results  Component Value Date   HGBA1C 9.0 (A) 09/10/2024   HGBA1C 8.1 (A) 06/05/2024   HGBA1C 7.7 12/20/2023     Lipid Panel ( most recent) Lipid Panel     Component Value Date/Time   CHOL 119 02/05/2024 0824   TRIG 64 02/05/2024 0824   HDL 60 02/05/2024 0824   CHOLHDL 2.0 02/05/2024 0824   VLDL 13 02/05/2024 0824   LDLCALC 46 02/05/2024 0824      Lab Results  Component Value Date   TSH 1.518 06/26/2021           Assessment & Plan:   1) Type 2 diabetes mellitus with hyperglycemia, with long-term current use of insulin  (HCC) (Primary)  She presents today with her CGM showing slightly above target glycemic profile overall.  Her POCT A1c today is 9%, increasing from last visit of 8.1%.  She does note she has been under more stress recently and pain.  Analysis of her CGM shows TIR 45%, TAR 55%, TBR 0% with a GMI of 8%.  She thinks her insulin  isn't working anymore.  - Katelyn Kelly has currently uncontrolled symptomatic type 2 DM since 63 years of age.   -Recent labs reviewed.  - I had a long discussion with her about the progressive nature of diabetes and the pathology behind its complications. 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 These are all discussed in detail with her.  The following Lifestyle Medicine recommendations according to American  College of Lifestyle Medicine [facility]) were discussed and offered to patient and she agrees to start the journey:  A. Whole Foods, Plant-based plate comprising of fruits and vegetables, plant-based proteins, whole-grain carbohydrates was discussed in detail with the patient.   A list for source of those nutrients were also provided to the patient.  Patient will use only water or unsweetened tea for hydration. B.  The need to stay away from risky substances including alcohol, smoking; obtaining 7 to 9 hours of restorative sleep, at least 150 minutes of moderate intensity exercise weekly, the importance of healthy social connections,  and stress reduction techniques were discussed. C.  A full color page of  Calorie density of various food groups per pound showing examples of each food groups was provided to the patient.  - Nutritional counseling repeated/built upon at each appointment.  -  The patient admits there is a room for improvement in their diet and drink choices. -  Suggestion is made for the patient to avoid simple carbohydrates from their diet including Cakes, Sweet Desserts / Pastries, Ice Cream, Soda (diet and regular), Sweet Tea, Candies, Chips, Cookies, Sweet Pastries, Store Bought Juices, Alcohol in Excess of 1-2 drinks a day, Artificial Sweeteners, Coffee Creamer, and Sugar-free Products. This will help patient to have stable blood glucose profile and potentially avoid unintended weight gain.   - I encouraged the patient to switch to unprocessed or minimally processed complex starch and increased protein intake (animal or plant source), fruits, and vegetables.   - Patient is advised to stick to a routine mealtimes to eat 3 meals a day and avoid unnecessary snacks (to snack only to correct hypoglycemia).  - I have approached her with the following individualized plan to manage her diabetes and patient agrees:   - he is advised to increase Tresiba  to 30 units SQ nightly and increase  Lispro to 8-14 units TID with meals if glucose is above 90 and she is eating (Specific instructions on how to titrate insulin  dosage based on glucose readings given to patient in writing).  I advised her to drink her coke zero with her meals and nothing but water in between to help glucose reset between meals.  -she is encouraged to start/continue monitoring glucose 4 times daily (using her CGM), before meals and before bed, and to call the clinic if she has readings less than 70 or above 300 for 3 tests in a row.  - she is warned not to take insulin  without proper monitoring per orders. - Adjustment parameters are given to her for hypo and hyperglycemia in writing.  - she is not an ideal candidate for incretin therapy given heavy smoking history which increases her risk of pancreatitis with the GLP1 class.  She has tried this in the past and developed severe constipation.  She has also tried Jardiance  in the past but did not tolerate it due to yeast formation.  Januvia, Glipizide, and Metformin were all tried in the past as well but stopped due to nausea.  - Specific targets for  A1c; LDL, HDL, and Triglycerides were discussed with the patient.  2) Blood Pressure /Hypertension:  her blood pressure is controlled to target.   she is advised to continue her current medications as prescribed by her PCP.  3) Lipids/Hyperlipidemia:    Review of her recent lipid panel from 02/05/24 showed controlled LDL at 46.  she is advised to continue Lipitor  80 mg daily at bedtime.  Side effects and precautions discussed with her.  4)  Weight/Diet:  her Body mass index is 35.03 kg/m.  -  clearly complicating her diabetes care.   she is a candidate for weight loss. I discussed with her the fact that loss of 5 - 10% of her  current body weight will have the most impact on her diabetes management.  Exercise, and detailed carbohydrates information provided  -  detailed on discharge instructions.  5) Chronic  Care/Health Maintenance: -she is not on ACEI/ARB and is on Statin medications and is encouraged to initiate and continue to follow up with Ophthalmology, Dentist, Podiatrist at least yearly or according to recommendations, and advised to QUIT SMOKING (already in the process of quitting). I have recommended yearly flu vaccine and pneumonia vaccine at least every 5 years; moderate intensity exercise for up to 150 minutes weekly; and sleep for at least 7  hours a day.  - she is advised to maintain close follow up with [name], FNP for primary care needs, as well as her other providers for optimal and coordinated care.     I spent  47  minutes in the care of the patient today including review of labs from CMP, Lipids, Thyroid Function, Hematology (current and previous including abstractions from other facilities); face-to-face time discussing  her blood glucose readings/logs, discussing hypoglycemia and hyperglycemia episodes and symptoms, medications doses, her options of short and long term treatment based on the latest standards of care / guidelines;  discussion about incorporating lifestyle medicine;  and documenting the encounter. Risk reduction counseling performed per USPSTF guidelines to reduce obesity and cardiovascular risk factors.     Please refer to Patient Instructions for Blood Glucose Monitoring and Insulin /Medications Dosing Guide  in media tab for additional information. Please  also refer to  Patient Self Inventory in the Media  tab for reviewed elements of pertinent patient history.  Katelyn Kelly participated in the discussions, expressed understanding, and voiced agreement with the above plans.  All questions were answered to her satisfaction. she is encouraged to contact clinic should she have any questions or concerns prior to her return visit.     Follow up plan: - Return in about 4 months (around 01/08/2025) for Diabetes F/U with A1c in office, No previsit labs,  Bring meter and logs.   Benton Rio, [facility] [address] [place], [place] [address] Phone: [phone] Fax: [phone]  09/10/2024, 8:27 AM

## 2024-09-11 NOTE — Progress Notes (Signed)
 I connected with  MARSHELLA TELLO on 09/11/24 by a video and audio enabled telemedicine application and verified that I am speaking with the correct person using two identifiers.  Patient Location: Home  Provider Location: Office/Clinic  I discussed the limitations of evaluation and management by telemedicine. The patient expressed understanding and agreed to proceed.  Subjective:   Katelyn Kelly is a 63 y.o. female who presents for a Medicare Annual Wellness Visit.  Allergies (verified) Meprobamate, Soliqua [insulin  glargine-lixisenatide], Tranxene [clorazepate dipotassium], Albuterol , Doxepin hcl, Haloperidol, Sertraline, Vortioxetine, and Elavil [amitriptyline]   History: Past Medical History:  Diagnosis Date   Anxiety and depression    Bilateral carpal tunnel syndrome    Chronic back pain    Chronic neck pain    Depression    Diabetes mellitus    GERD (gastroesophageal reflux disease)    H/O syncope    Headache    History of panic attacks    Hypertension    IBS (irritable bowel syndrome)    Manic depression (HCC)    Migraine    Panic attack    Past Surgical History:  Procedure Laterality Date   ABDOMINAL HYSTERECTOMY     ABDOMINAL SURGERY     APPENDECTOMY     BIOPSY  09/05/2017   Procedure: BIOPSY;  Surgeon: Harvey Margo CROME, MD;  Location: [facility];  Service: Endoscopy;;  random colon   BIOPSY  05/01/2018   Procedure: BIOPSY;  Surgeon: Harvey Margo CROME, MD;  Location: [facility];  Service: Endoscopy;;  gastric biopsy   CARPAL TUNNEL RELEASE Bilateral    CHOLECYSTECTOMY     COLONOSCOPY WITH PROPOFOL  N/A 09/05/2017   Procedure: COLONOSCOPY WITH PROPOFOL ;  Surgeon: Harvey Margo CROME, MD; normal TI, 5 small polyps, diverticulosis in the cecum, internal and external hemorrhoids.  Random colon biopsies benign.  Colon polyps were hyperplastic.  Repeat in 2023.   CORONARY BALLOON ANGIOPLASTY N/A 06/28/2021   Procedure: CORONARY BALLOON ANGIOPLASTY;  Surgeon:  Wonda Sharper, MD;  Location: [facility] INVASIVE CV LAB;  Service: Cardiovascular;  Laterality: N/A;   DILATION AND CURETTAGE OF UTERUS     ESOPHAGOGASTRODUODENOSCOPY (EGD) WITH PROPOFOL  N/A 05/01/2018   Procedure: ESOPHAGOGASTRODUODENOSCOPY (EGD) WITH PROPOFOL ;  Surgeon: Harvey Margo CROME, MD;  normal esophagus, small hiatal hernia, mild gastritis s/p biopsy, normal duodenum.  No H. pylori.    HERNIA REPAIR     INCISIONAL HERNIA REPAIR N/A 11/06/2020   Procedure: HERNIA REPAIR INCISIONAL, PRIMARY REPAIR;  Surgeon: Kallie Manuelita BROCKS, MD;  Location: [facility];  Service: General;  Laterality: N/A;   LEFT HEART CATH AND CORONARY ANGIOGRAPHY N/A 06/28/2021   Procedure: LEFT HEART CATH AND CORONARY ANGIOGRAPHY;  Surgeon: Wonda Sharper, MD;  Location: [facility] INVASIVE CV LAB;  Service: Cardiovascular;  Laterality: N/A;   POLYPECTOMY  09/05/2017   Procedure: POLYPECTOMY;  Surgeon: Harvey Margo CROME, MD;  Location: [facility];  Service: Endoscopy;;  sigmoid and rectal   TUBAL LIGATION     Family History  Problem Relation Age of Onset   Diabetes Mother    Stroke Mother    Depression Mother    Lung cancer Mother    Colon cancer Father 32       Passed away from colon cancer   Colon cancer Brother 58       Passed away from colon cancer   Bipolar disorder Son    Social History   Occupational History   Not on file  Tobacco Use   Smoking status: Every  Day    Current packs/day: 1.00    Average packs/day: 1 pack/day for 49.9 years (49.9 ttl pk-yrs)    Types: Cigarettes    Start date: 1976   Smokeless tobacco: Never   Tobacco comments:    one pack a day. Pt has purchased nicotine  patches. She has a plan to stop and is going to set a quit date.   Vaping Use   Vaping status: Never Used  Substance and Sexual Activity   Alcohol use: No   Drug use: No   Sexual activity: Not Currently    Birth control/protection: Surgical   Tobacco Counseling Ready to quit: Yes Counseling given: Yes Tobacco comments:  one pack a day. Pt has purchased nicotine  patches. She has a plan to stop and is going to set a quit date.   SDOH Screenings   Food Insecurity: No Food Insecurity (09/09/2024)  Housing: Low Risk  (09/09/2024)  Transportation Needs: No Transportation Needs (09/09/2024)  Utilities: Not At Risk (09/09/2024)  Depression (PHQ2-9): Low Risk  (09/09/2024)  Recent Concern: Depression (PHQ2-9) - High Risk (06/26/2024)  Physical Activity: Insufficiently Active (09/09/2024)  Social Connections: Patient Declined (09/09/2024)  Stress: No Stress Concern Present (09/09/2024)  Tobacco Use: High Risk (09/10/2024)  Health Literacy: Adequate Health Literacy (09/09/2024)   Depression Screen    09/09/2024   12:41 PM 06/26/2024    1:02 PM 06/07/2024    1:09 PM 03/26/2024    1:18 PM 12/16/2021    3:13 PM 09/16/2021    1:09 PM  PHQ 2/9 Scores  PHQ - 2 Score 0 6 6 6 6 6   PHQ- 9 Score  22  22  17  21  19       Data saved with a previous flowsheet row definition     Goals Addressed               This Visit's Progress     I'd like to lose weight (pt-stated)         Visit info / Clinical Intake: Medicare Wellness Visit Type:: Subsequent Annual Wellness Visit Persons participating in visit:: patient Medicare Wellness Visit Mode:: Video Because this visit was a virtual/telehealth visit:: pt reported vitals Information given by:: patient Interpreter Needed?: No Pre-visit prep was completed: yes AWV questionnaire completed by patient prior to visit?: no Living arrangements:: with family/others Typical amount of pain: some Does pain affect daily life?: (!) yes Are you currently prescribed opioids?: (!) yes  Dietary Habits and Nutritional Risks How many meals a day?: 3 Eats fruit and vegetables daily?: yes Most meals are obtained by: preparing own meals In the last 2 weeks, have you had any of the following?: none Diabetic:: (!) yes Any non-healing wounds?: no How often do you check your BS?:  4 Would you like to be referred to a Nutritionist or for Diabetic Management? : no  Functional Status Activities of Daily Living (to include ambulation/medication): Independent Ambulation: Independent Medication Administration: Independent Home Management: Independent Manage your own finances?: yes Primary transportation is: driving Concerns about vision?: no *vision screening is required for WTM* Concerns about hearing?: no  Fall Screening Falls in the past year?: 0 Number of falls in past year: 0 Was there an injury with Fall?: 0 Fall Risk Category Calculator: 0 Patient Fall Risk Level: Low Fall Risk  Fall Risk Patient at Risk for Falls Due to: No Fall Risks Fall risk Follow up: Falls evaluation completed; Education provided; Falls prevention discussed  Home and Transportation Safety: All rugs  have non-skid backing?: yes All stairs or steps have railings?: yes Have non-skid surface in bathtub or shower?: yes Good home lighting?: yes Regular seat belt use?: yes Hospital stays in the last year:: no  Cognitive Assessment Difficulty concentrating, remembering, or making decisions? : no Will 6CIT or Mini Cog be Completed: no 6CIT or Mini Cog Declined: patient alert, oriented, able to answer questions appropriately and recall recent events  Advance Directives (For Healthcare) Does Patient Have a Medical Advance Directive?: No Would patient like information on creating a medical advance directive?: No - Patient declined  Reviewed/Updated  Reviewed/Updated: Reviewed All (Medical, Surgical, Family, Medications, Allergies, Care Teams, Patient Goals)        Objective:    Today's Vitals   09/09/24 0912  Weight: 181 lb (82.1 kg)  Height: 5' 1 (1.549 m)   Body mass index is 34.2 kg/m.  Current Medications (verified) Outpatient Encounter Medications as of 09/09/2024  Medication Sig   ALPRAZolam  (XANAX ) 1 MG tablet Take 0.5-1 tablets (0.5-1 mg total) by mouth 3 (three)  times daily as needed for anxiety or sleep.   aspirin  EC 81 MG tablet Take 1 tablet (81 mg total) by mouth daily. Swallow whole.   atorvastatin  (LIPITOR ) 40 MG tablet Take 1 tablet (40 mg total) by mouth daily.   busPIRone  (BUSPAR ) 7.5 MG tablet Take by mouth See admin instructions. Take 0.5 tablets (3.75 mg total) by mouth 3 times daily for 7 days, THEN 1 tablet (7.5 mg total) 3 times daily for 7 days, THEN 2 tablets (15 mg total) 3 times daily   busPIRone  (BUSPAR ) 7.5 MG tablet Take 1 tablet by mouth 3 (three) times daily as needed.   Fremanezumab -vfrm (AJOVY ) 225 MG/1.5ML SOAJ Inject 225 mg into the skin every 30 (thirty) days.   furosemide  (LASIX ) 20 MG tablet Take 1 tablet (20 mg total) by mouth 2 (two) times daily.   HYDROcodone -acetaminophen  (NORCO) 7.5-325 MG tablet Take 1 tablet by mouth every 6 (six) hours as needed for moderate pain (pain score 4-6).   ipratropium (ATROVENT  HFA) 17 MCG/ACT inhaler Inhale 2 puffs into the lungs every 6 (six) hours as needed for wheezing.   isosorbide  mononitrate (IMDUR ) 60 MG 24 hr tablet Take 1 tablet (60 mg total) by mouth daily.   linaclotide  (LINZESS ) 290 MCG CAPS capsule Take 1 capsule (290 mcg total) by mouth daily.   linaclotide  (LINZESS ) 290 MCG CAPS capsule Take 1 capsule (290 mcg total) by mouth daily before breakfast.   Menthol, Topical Analgesic, (BIOFREEZE) 10 % LIQD Apply 1 application topically daily as needed (pain).   methocarbamol  (ROBAXIN ) 750 MG tablet Take 1-2 tablets (750-1,500 mg total) by mouth at bedtime.   metoprolol  tartrate (LOPRESSOR ) 25 MG tablet TAKE (1/2) TABLET BY MOUTH TWICE DAILY   nitroGLYCERIN  (NITROSTAT ) 0.4 MG SL tablet PLACE ONE TABLET UNDER THE TOUNGE EVERY FIVE MINUTES FOR 3 DOSES AS NEEDED FOR CHEST PAINS. (Patient taking differently: As needed)   NURTEC 75 MG TBDP TAKE ONE TABLET DAILY AS NEEDED.   ondansetron  (ZOFRAN -ODT) 4 MG disintegrating tablet Take 1 tablet (4 mg total) by mouth every 8 (eight) hours as  needed for nausea or vomiting.   pramipexole  (MIRAPEX ) 0.25 MG tablet Take 0.25 mg by mouth at bedtime.   pramipexole  (MIRAPEX ) 0.5 MG tablet Take 1 tablet (0.5 mg total) by mouth at bedtime. (Patient not taking: Reported on 09/10/2024)   pregabalin (LYRICA) 75 MG capsule Take 1 capsule (75 mg total) by mouth 2 (two) times daily. (  Patient not taking: Reported on 09/10/2024)   [DISCONTINUED] Continuous Glucose Sensor (FREESTYLE LIBRE 3 PLUS SENSOR) MISC Change sensor every 15 days.   [DISCONTINUED] glucose blood (ACCU-CHEK GUIDE TEST) test strip Use as instructed to monitor glucose 4 times daily   [DISCONTINUED] insulin  degludec (TRESIBA ) 100 UNIT/ML FlexTouch Pen Inject 25 Units into the skin at bedtime. Inject 30 units daily at bedtime as instructed by the provider. (Patient taking differently: Inject 24 Units into the skin at bedtime. Inject 30 units daily at bedtime as instructed by the provider.)   [DISCONTINUED] insulin  lispro (HUMALOG ) 100 UNIT/ML KwikPen Inject 5-11 Units into the skin 3 (three) times daily. Patient is to inject 5-11 units per sliding scale as directed by the provider.   [DISCONTINUED] SURE COMFORT PEN NEEDLES 32G X 4 MM MISC Use one test strip to check blood sugars 4 times a day as instructed by the provider.   No facility-administered encounter medications on file as of 09/09/2024.   Hearing/Vision screen Hearing Screening - Comments:: Patient denies any hearing difficulties.   Vision Screening - Comments:: Patient is not up to date on yearly eye exams.  A list of eye care providers were provided to the patient  Immunizations and Health Maintenance Health Maintenance  Topic Date Due   COVID-19 Vaccine (1) Never done   OPHTHALMOLOGY EXAM  Never done   Cervical Cancer Screening (HPV/Pap Cotest)  Never done   Zoster Vaccines- Shingrix (2 of 2) 11/25/2020   Mammogram  08/18/2022   Lung Cancer Screening  03/12/2024   DEXA SCAN  Never done   Diabetic kidney evaluation  - Urine ACR  01/10/2025 (Originally 09/08/1979)   HEMOGLOBIN A1C  03/10/2025   DTaP/Tdap/Td (2 - Td or Tdap) 07/05/2025   Diabetic kidney evaluation - eGFR measurement  09/06/2025   Medicare Annual Wellness (AWV)  09/09/2025   FOOT EXAM  09/10/2025   Colonoscopy  09/06/2027   Pneumococcal Vaccine: 50+ Years  Completed   Influenza Vaccine  Completed   Hepatitis C Screening  Completed   HIV Screening  Completed   Hepatitis B Vaccines 19-59 Average Risk  Aged Out   Meningococcal B Vaccine  Aged Out        Assessment/Plan:  This is a routine wellness examination for [place].  Patient Care Team: [name], FNP as PCP - General (Family Medicine) Alvan, Dorn FALCON, MD as Consulting Physician (Cardiology) Therisa Benton PARAS, NP as Nurse Practitioner (Endocrinology) Darroll Anes, DO (Optometry) Whitfield Raisin, NP as Nurse Practitioner (Neurology) Ezzard Sonny GORMAN DEVONNA as Physician Assistant (Gastroenterology) Gillie Duncans, MD as Consulting Physician (Neurosurgery)  I have personally reviewed and noted the following in the patient's chart:   Medical and social history Use of alcohol, tobacco or illicit drugs  Current medications and supplements including opioid prescriptions. Functional ability and status Nutritional status Physical activity Advanced directives List of other physicians Hospitalizations, surgeries, and ER visits in previous 12 months Vitals Screenings to include cognitive, depression, and falls Referrals and appointments  Orders Placed This Encounter  Procedures   DG Bone Density    Called on 09/10/24 LVMTCB/DT    Standing Status:   Future    Expiration Date:   09/09/2025    Reason for Exam (SYMPTOM  OR DIAGNOSIS REQUIRED):   post menopausal estrogen deficient    Preferred imaging location?:   [facility]   MM 3D SCREENING MAMMOGRAM BILATERAL BREAST    Called on 09/10/24 LVMTCB/DT    Standing Status:   Future  Expiration Date:   09/09/2025     Reason for Exam (SYMPTOM  OR DIAGNOSIS REQUIRED):   breast cancer screening    Preferred imaging location?:   [facility]   Ambulatory Referral for Lung Cancer Scre    Referral Priority:   Routine    Referral Type:   Consultation    Referral Reason:   Specialty Services Required    Number of Visits Requested:   1   Ambulatory referral to Podiatry    Referral Priority:   Routine    Referral Type:   Consultation    Referral Reason:   Specialty Services Required    Referred to Provider:   Blinda Katz, DPM    Requested Specialty:   Podiatry    Number of Visits Requested:   1   In addition, I have reviewed and discussed with patient certain preventive protocols, quality metrics, and best practice recommendations. A written personalized care plan for preventive services as well as general preventive health recommendations were provided to patient.   Riaan Toledo, CMA   09/11/2024   Return on September 10, 2025 at 8:40 am for your Medicare Wellness Visit.  After Visit Summary: (Mail) Due to this being a telephonic visit, the after visit summary with patients personalized plan was offered to patient via mail   Nurse Notes:

## 2024-09-12 DIAGNOSIS — R609 Edema, unspecified: Secondary | ICD-10-CM | POA: Insufficient documentation

## 2024-09-12 DIAGNOSIS — G2581 Restless legs syndrome: Secondary | ICD-10-CM | POA: Insufficient documentation

## 2024-09-12 NOTE — Assessment & Plan Note (Signed)
 Stable with Mirapex . Refills provided.

## 2024-09-12 NOTE — Assessment & Plan Note (Signed)
 Add Lyrica for ongoing neuropathy

## 2024-09-12 NOTE — Assessment & Plan Note (Signed)
 Chronic low back pain with sciatica causing discomfort and affecting sleep. Oxycodone  caused side effects. Physical therapy initiated but only one session attended. She prefers non-surgical management. - Prescribe hydrocodone  for pain management.  PDMP reviewed.   - Lyrica added for additional pain relief.  - Encourage continuation and scheduling of physical therapy sessions.

## 2024-09-12 NOTE — Assessment & Plan Note (Signed)
 Stable with Atrovent  HFA.  Refills provided.

## 2024-09-12 NOTE — Telephone Encounter (Signed)
 Issue was addressed by PCP on 11/7.  Per PCP:  Edema of lower extremities Chronic lower extremity edema, worse in right leg, persistent since heart attack. Lasix  once daily was insufficient. - Increased Lasix  to twice daily. - Checked potassium level due to increased Lasix  dosage.   Will route to provider as RICK

## 2024-09-12 NOTE — Assessment & Plan Note (Signed)
 Continue with Lasix  for prn use.

## 2024-09-16 ENCOUNTER — Ambulatory Visit: Payer: Self-pay | Admitting: *Deleted

## 2024-09-16 NOTE — Telephone Encounter (Signed)
Noted patient scheduled

## 2024-09-16 NOTE — Telephone Encounter (Signed)
 FYI Only or Action Required?: FYI only for provider: appointment scheduled on 11/18.  Patient was last seen in primary care on 09/06/2024 by [name], FNP.  Called Nurse Triage reporting Leg Pain.  Symptoms began several weeks ago.  Interventions attempted: Rest, hydration, or home remedies.  Symptoms are: gradually worsening.  Triage Disposition: See HCP Within 4 Hours (Or PCP Triage)  Patient/caregiver understands and will follow disposition?: yes- unable to get appointment within disposition- patient wants to be seen at office- so scheduled within 24 hour   Copied from CRM #[id number]. Topic: Clinical - Red Word Triage >> Sep 16, 2024 10:41 AM Katelyn Kelly wrote: Red Word that prompted transfer to Nurse Triage: Patient is holding on to walk, difficulty showering, having severe pain in legs.  Patient stopped taking Lyrica due to it making her high- she's only been on it for a few days. Her heart was racing and brain was spinning around. Reason for Disposition  [1] SEVERE pain (e.g., excruciating, unable to do any normal activities) AND [2] not improved after 2 hours of pain medicine  Answer Assessment - Initial Assessment Questions 1. ONSET: When did the pain start?      Few weeks ago 2. LOCATION: Where is the pain located?      Knee to ankle- R leg 3. PAIN: How bad is the pain?    (Scale 1-10; or mild, moderate, severe)     severe 4. WORK OR EXERCISE: Has there been any recent work or exercise that involved this part of the body?      no 5. CAUSE: What do you think is causing the leg pain?     Patient thinks she has blood clot 6. OTHER SYMPTOMS: Do you have any other symptoms? (e.g., chest pain, back pain, breathing difficulty, swelling, rash, fever, numbness, weakness)      Patient is unable to walk without severe pain. Chronic back pain. Patient has had to stop Lyrica- she states SE too strong.  Protocols used: Leg Pain-A-AH

## 2024-09-17 ENCOUNTER — Ambulatory Visit (INDEPENDENT_AMBULATORY_CARE_PROVIDER_SITE_OTHER): Admitting: Internal Medicine

## 2024-09-17 ENCOUNTER — Encounter: Payer: Self-pay | Admitting: Internal Medicine

## 2024-09-17 ENCOUNTER — Ambulatory Visit (HOSPITAL_COMMUNITY)
Admission: RE | Admit: 2024-09-17 | Discharge: 2024-09-17 | Disposition: A | Discharge status: Home / Self Care | Source: Ambulatory Visit | Attending: Internal Medicine | Admitting: Internal Medicine

## 2024-09-17 ENCOUNTER — Ambulatory Visit: Payer: Self-pay | Admitting: Internal Medicine

## 2024-09-17 VITALS — BP 121/67 | HR 89 | Ht 61.0 in | Wt 184.0 lb

## 2024-09-17 DIAGNOSIS — M7989 Other specified soft tissue disorders: Secondary | ICD-10-CM | POA: Diagnosis present

## 2024-09-17 DIAGNOSIS — M79604 Pain in right leg: Secondary | ICD-10-CM | POA: Insufficient documentation

## 2024-09-17 DIAGNOSIS — M51372 Other intervertebral disc degeneration, lumbosacral region with discogenic back pain and lower extremity pain: Secondary | ICD-10-CM

## 2024-09-17 MED ORDER — MELOXICAM 7.5 MG PO TABS
7.5000 mg | ORAL_TABLET | Freq: Every day | ORAL | 0 refills | Status: AC
Start: 2024-09-17 — End: ?

## 2024-09-17 NOTE — Assessment & Plan Note (Signed)
 Right leg swelling and severe pain, has limited mobility at baseline due to chronic medical conditions Check US  of RLE to rule out DVT Has history of HFpEF and likely has chronic venous insufficiency Continue aspirin  81 mg once daily  Addendum: US  of RLE was negative for DVT -advised to perform leg elevation and take Lasix  20 mg BID for leg swelling

## 2024-09-17 NOTE — Progress Notes (Signed)
 Acute Office Visit  Subjective:    Patient ID: Katelyn Kelly, female    DOB: 1961/03/23, 63 y.o.   MRN: [id number]  Chief Complaint  Patient presents with   Leg Pain    Reports severe right leg pain , d/c lyrica , concerned about a blood clot, hurts to walk and to stand    HPI Patient is in today for complaint of right leg swelling and severe right leg pain for the last 2 weeks.  Her pain is constant, dull, and is unrelated to activity.  She has noticed a knot on right leg as well. She is concerned about a DVT.  Of note, she denies any recent episode of prolonged immobility or recent travel.  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  She was recently given Lyrica by PCP, but she felt high with it and has stopped taking it.  She is taking Norco as needed for severe pain, but prefers to avoid taking more than once a day.  Past Medical History:  Diagnosis Date   Anxiety and depression    Bilateral carpal tunnel syndrome    Chronic back pain    Chronic neck pain    Depression    Diabetes mellitus    GERD (gastroesophageal reflux disease)    H/O syncope    Headache    History of panic attacks    Hypertension    IBS (irritable bowel syndrome)    Manic depression (HCC)    Migraine    Panic attack     Past Surgical History:  Procedure Laterality Date   ABDOMINAL HYSTERECTOMY     ABDOMINAL SURGERY     APPENDECTOMY     BIOPSY  09/05/2017   Procedure: BIOPSY;  Surgeon: Harvey Margo CROME, MD;  Location: [facility];  Service: Endoscopy;;  random colon   BIOPSY  05/01/2018   Procedure: BIOPSY;  Surgeon: Harvey Margo CROME, MD;  Location: [facility];  Service: Endoscopy;;  gastric biopsy   CARPAL TUNNEL RELEASE Bilateral    CHOLECYSTECTOMY     COLONOSCOPY WITH PROPOFOL  N/A 09/05/2017   Procedure: COLONOSCOPY WITH PROPOFOL ;  Surgeon: Harvey Margo CROME, MD; normal TI, 5 small polyps, diverticulosis in the cecum, internal and external hemorrhoids.  Random  colon biopsies benign.  Colon polyps were hyperplastic.  Repeat in 2023.   CORONARY BALLOON ANGIOPLASTY N/A 06/28/2021   Procedure: CORONARY BALLOON ANGIOPLASTY;  Surgeon: Wonda Sharper, MD;  Location: [facility] INVASIVE CV LAB;  Service: Cardiovascular;  Laterality: N/A;   DILATION AND CURETTAGE OF UTERUS     ESOPHAGOGASTRODUODENOSCOPY (EGD) WITH PROPOFOL  N/A 05/01/2018   Procedure: ESOPHAGOGASTRODUODENOSCOPY (EGD) WITH PROPOFOL ;  Surgeon: Harvey Margo CROME, MD;  normal esophagus, small hiatal hernia, mild gastritis s/p biopsy, normal duodenum.  No H. pylori.    HERNIA REPAIR     INCISIONAL HERNIA REPAIR N/A 11/06/2020   Procedure: HERNIA REPAIR INCISIONAL, PRIMARY REPAIR;  Surgeon: Kallie Manuelita BROCKS, MD;  Location: [facility];  Service: General;  Laterality: N/A;   LEFT HEART CATH AND CORONARY ANGIOGRAPHY N/A 06/28/2021   Procedure: LEFT HEART CATH AND CORONARY ANGIOGRAPHY;  Surgeon: Wonda Sharper, MD;  Location: [facility] INVASIVE CV LAB;  Service: Cardiovascular;  Laterality: N/A;   POLYPECTOMY  09/05/2017   Procedure: POLYPECTOMY;  Surgeon: Harvey Margo CROME, MD;  Location: [facility];  Service: Endoscopy;;  sigmoid and rectal   TUBAL LIGATION      Family History  Problem Relation Age of Onset  Diabetes Mother    Stroke Mother    Depression Mother    Lung cancer Mother    Colon cancer Father 62       Passed away from colon cancer   Colon cancer Brother 82       Passed away from colon cancer   Bipolar disorder Son     Social History   Socioeconomic History   Marital status: Legally Separated    Spouse name: Not on file   Number of children: Not on file   Years of education: Not on file   Highest education level: Not on file  Occupational History   Not on file  Tobacco Use   Smoking status: Every Day    Current packs/day: 1.00    Average packs/day: 1 pack/day for 49.9 years (49.9 ttl pk-yrs)    Types: Cigarettes    Start date: 10   Smokeless tobacco: Never   Tobacco comments:     one pack a day. Pt has purchased nicotine  patches. She has a plan to stop and is going to set a quit date.   Vaping Use   Vaping status: Never Used  Substance and Sexual Activity   Alcohol use: No   Drug use: No   Sexual activity: Not Currently    Birth control/protection: Surgical  Other Topics Concern   Not on file  Social History Narrative   Not on file   Social Drivers of Health   Financial Resource Strain: Not on file  Food Insecurity: No Food Insecurity (09/09/2024)   Hunger Vital Sign    Worried About Running Out of Food in the Last Year: Never true    Ran Out of Food in the Last Year: Never true  Transportation Needs: No Transportation Needs (09/09/2024)   PRAPARE - Administrator, Civil Service (Medical): No    Lack of Transportation (Non-Medical): No  Physical Activity: Insufficiently Active (09/09/2024)   Exercise Vital Sign    Days of Exercise per Week: 7 days    Minutes of Exercise per Session: 20 min  Stress: No Stress Concern Present (09/09/2024)   [employer] of Occupational Health - Occupational Stress Questionnaire    Feeling of Stress: Not at all  Social Connections: Patient Declined (09/09/2024)   Social Connection and Isolation Panel    Frequency of Communication with Friends and Family: Patient declined    Frequency of Social Gatherings with Friends and Family: Patient declined    Attends Religious Services: Patient declined    Database Administrator or Organizations: Patient declined    Attends Banker Meetings: Patient declined    Marital Status: Patient declined  Intimate Partner Violence: Not At Risk (09/09/2024)   Humiliation, Afraid, Rape, and Kick questionnaire    Fear of Current or Ex-Partner: No    Emotionally Abused: No    Physically Abused: No    Sexually Abused: No    Outpatient Medications Prior to Visit  Medication Sig Dispense Refill   ALPRAZolam  (XANAX ) 1 MG tablet Take 0.5-1 tablets (0.5-1 mg total)  by mouth 3 (three) times daily as needed for anxiety or sleep. 75 tablet 0   aspirin  EC 81 MG tablet Take 1 tablet (81 mg total) by mouth daily. Swallow whole.     atorvastatin  (LIPITOR ) 40 MG tablet Take 1 tablet (40 mg total) by mouth daily. 90 tablet 3   busPIRone  (BUSPAR ) 7.5 MG tablet Take by mouth See admin instructions. Take 0.5 tablets (3.75 mg  total) by mouth 3 times daily for 7 days, THEN 1 tablet (7.5 mg total) 3 times daily for 7 days, THEN 2 tablets (15 mg total) 3 times daily     busPIRone  (BUSPAR ) 7.5 MG tablet Take 1 tablet by mouth 3 (three) times daily as needed.     Continuous Glucose Sensor (FREESTYLE LIBRE 3 PLUS SENSOR) MISC Change sensor every 15 days. 6 each 3   Fremanezumab -vfrm (AJOVY ) 225 MG/1.5ML SOAJ Inject 225 mg into the skin every 30 (thirty) days. 1.68 mL 11   furosemide  (LASIX ) 20 MG tablet Take 1 tablet (20 mg total) by mouth 2 (two) times daily. 180 tablet 1   glucose blood (ACCU-CHEK GUIDE TEST) test strip Use as instructed to monitor glucose 4 times daily 300 each 12   HYDROcodone -acetaminophen  (NORCO) 7.5-325 MG tablet Take 1 tablet by mouth every 6 (six) hours as needed for moderate pain (pain score 4-6). 30 tablet 0   insulin  degludec (TRESIBA ) 100 UNIT/ML FlexTouch Pen Inject 30 Units into the skin at bedtime. Inject 30 units daily at bedtime as instructed by the provider. 27 mL 3   insulin  lispro (HUMALOG ) 100 UNIT/ML KwikPen Inject 8-14 Units into the skin 3 (three) times daily. Patient is to inject 5-11 units per sliding scale as directed by the provider. 40 mL 3   ipratropium (ATROVENT  HFA) 17 MCG/ACT inhaler Inhale 2 puffs into the lungs every 6 (six) hours as needed for wheezing. 12.9 g 3   isosorbide  mononitrate (IMDUR ) 60 MG 24 hr tablet Take 1 tablet (60 mg total) by mouth daily. 90 tablet 0   linaclotide  (LINZESS ) 290 MCG CAPS capsule Take 1 capsule (290 mcg total) by mouth daily. 30 capsule 11   linaclotide  (LINZESS ) 290 MCG CAPS capsule Take 1  capsule (290 mcg total) by mouth daily before breakfast.     Menthol, Topical Analgesic, (BIOFREEZE) 10 % LIQD Apply 1 application topically daily as needed (pain).     methocarbamol  (ROBAXIN ) 750 MG tablet Take 1-2 tablets (750-1,500 mg total) by mouth at bedtime. 30 tablet 2   metoprolol  tartrate (LOPRESSOR ) 25 MG tablet TAKE (1/2) TABLET BY MOUTH TWICE DAILY 90 tablet 3   nitroGLYCERIN  (NITROSTAT ) 0.4 MG SL tablet PLACE ONE TABLET UNDER THE TOUNGE EVERY FIVE MINUTES FOR 3 DOSES AS NEEDED FOR CHEST PAINS. (Patient taking differently: As needed) 25 tablet 3   NURTEC 75 MG TBDP TAKE ONE TABLET DAILY AS NEEDED. 8 tablet 6   ondansetron  (ZOFRAN -ODT) 4 MG disintegrating tablet Take 1 tablet (4 mg total) by mouth every 8 (eight) hours as needed for nausea or vomiting. 30 tablet 2   SURE COMFORT PEN NEEDLES 32G X 4 MM MISC Use one test strip to check blood sugars 4 times a day as instructed by the provider. 200 each 2   pregabalin (LYRICA) 75 MG capsule Take 1 capsule (75 mg total) by mouth 2 (two) times daily. 60 capsule 2   No facility-administered medications prior to visit.    Allergies  Allergen Reactions   Meprobamate Hives and Swelling   Soliqua [Insulin  Glargine-Lixisenatide] Other (See Comments)    Flu like symptoms , Shortness of breathe   Tranxene [Clorazepate Dipotassium] Hives and Swelling   Albuterol  Other (See Comments)    Patient does not remember the reaction. This was a record provided via [facility]   Doxepin Hcl Other (See Comments)   Haloperidol Other (See Comments)    Patient does not remember the reaction. This was a record provided  via [facility]   Sertraline Other (See Comments)    Patient does not remember the reaction. This was a record provided via [facility]   Vortioxetine Other (See Comments)    Patient does not remember the reaction. This was a record provided via [facility]   Elavil [Amitriptyline] Palpitations    Review of Systems   Constitutional:  Negative for chills and fever.  HENT:  Negative for congestion, sinus pressure, sinus pain and sore throat.   Eyes:  Negative for pain and discharge.  Respiratory:  Negative for cough and shortness of breath.   Cardiovascular:  Negative for chest pain and palpitations.  Gastrointestinal:  Negative for abdominal pain, diarrhea, nausea and vomiting.  Endocrine: Negative for polydipsia and polyuria.  Genitourinary:  Negative for dysuria and hematuria.  Musculoskeletal:  Positive for back pain. Negative for neck pain and neck stiffness.       Right leg pain  Skin:  Negative for rash.  Neurological:  Negative for dizziness and weakness.  Psychiatric/Behavioral:  Negative for agitation and behavioral problems.        Objective:    Physical Exam Vitals reviewed.  Constitutional:      General: She is not in acute distress.    Appearance: She is not diaphoretic.  HENT:     Head: Normocephalic and atraumatic.     Nose: Nose normal.     Mouth/Throat:     Mouth: Mucous membranes are moist.  Eyes:     General: No scleral icterus.    Extraocular Movements: Extraocular movements intact.  Cardiovascular:     Rate and Rhythm: Normal rate and regular rhythm.     Heart sounds: Normal heart sounds. No murmur heard. Pulmonary:     Breath sounds: Normal breath sounds. No wheezing or rales.  Musculoskeletal:     Cervical back: Neck supple. No tenderness.     Lumbar back: Tenderness present. Positive right straight leg raise test and positive left straight leg raise test.     Right lower leg: Tenderness present. Edema (1+) present.     Left lower leg: Edema (1+) present.  Skin:    General: Skin is warm.     Findings: No rash.  Neurological:     General: No focal deficit present.     Mental Status: She is alert and oriented to person, place, and time.  Psychiatric:        Mood and Affect: Mood is anxious.        Behavior: Behavior is cooperative.     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 pain and leg pain   Chronic low back pain with bilateral radicular symptoms Recently has severe RLE pain, did not tolerate Lyrica Norco as needed for severe pain Meloxicam as needed for mild to moderate pain -would avoid NSAID for longer period due to her history of GERD Avoid heavy lifting and frequent bending Heating pad and/or back brace as needed  Relevant Medications   meloxicam (MOBIC) 7.5 MG tablet     Other   Leg swelling - Primary   Right leg swelling and severe pain, has limited mobility at baseline due to chronic medical conditions Check US  of RLE to rule out DVT Has history of HFpEF and likely has chronic venous insufficiency Continue aspirin  81 mg once daily  Addendum: US  of RLE was negative for DVT -advised to perform leg elevation and take Lasix  20 mg BID for leg swelling       Relevant Orders   US  Venous Img Lower Unilateral Right (Completed)   Right leg pain   US  of RLE was negative for DVT today Likely muscular in etiology Has history of restless leg syndrome as well -did not respond well to ropinirole or pramipexole  Was recently given Lyrica, but did not tolerate it Meloxicam as needed for muscular pain for now Lump on right leg appears 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    Dispense:  30 tablet    Refill:  0     Luiscarlos Kaczmarczyk  Katelyn Blanch, MD

## 2024-09-17 NOTE — Assessment & Plan Note (Addendum)
 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

## 2024-09-17 NOTE — Patient Instructions (Signed)
 Please get US  of leg done at [facility].

## 2024-09-17 NOTE — Assessment & Plan Note (Addendum)
 US  of RLE was negative for DVT today Likely muscular in etiology Has history of restless leg syndrome as well -did not respond well to ropinirole or pramipexole  Was recently given Lyrica, but did not tolerate it Meloxicam as needed for muscular pain for now Lump on right leg appears to be lipoma, if persistent localized pain, may consider US  of RLE (nonvascular)

## 2024-09-19 ENCOUNTER — Ambulatory Visit: Admitting: Nurse Practitioner

## 2024-10-01 ENCOUNTER — Other Ambulatory Visit: Payer: Self-pay

## 2024-10-01 DIAGNOSIS — M5440 Lumbago with sciatica, unspecified side: Secondary | ICD-10-CM

## 2024-10-01 DIAGNOSIS — F410 Panic disorder [episodic paroxysmal anxiety] without agoraphobia: Secondary | ICD-10-CM

## 2024-10-01 MED ORDER — ALPRAZOLAM 1 MG PO TABS: 0.5000 mg | ORAL_TABLET | Freq: Three times a day (TID) | ORAL | 0 refills | Status: DC | PRN

## 2024-10-01 MED ORDER — HYDROCODONE-ACETAMINOPHEN 7.5-325 MG PO TABS: 1.0000 | ORAL_TABLET | Freq: Four times a day (QID) | ORAL | 0 refills | Status: AC | PRN

## 2024-10-01 NOTE — Telephone Encounter (Signed)
 Copied from CRM #[id number]. Topic: Clinical - Medication Refill >> Oct 01, 2024  1:16 PM Emylou G wrote: Medication: HYDROcodone -acetaminophen  (NORCO) 7.5-325 MG tablet ALPRAZolam  (XANAX ) 1 MG tablet   Has the patient contacted their pharmacy? Yes (Agent: If no, request that the patient contact the pharmacy for the refill. If patient does not wish to contact the pharmacy document the reason why and proceed with request.) (Agent: If yes, when and what did the pharmacy advise?) said to call us   This is the patient's preferred pharmacy:  [facility] [place], [place] - [address] [place] [address] Phone: [phone] Fax: [phone]  Is this the correct pharmacy for this prescription? Yes If no, delete pharmacy and type the correct one.   Has the prescription been filled recently? No  Is the patient out of the medication? Yes  Has the patient been seen for an appointment in the last year OR does the patient have an upcoming appointment? Yes  Can we respond through MyChart? Yes  Agent: Please be advised that Rx refills may take up to 3 business days. We ask that you follow-up with your pharmacy.

## 2024-10-08 ENCOUNTER — Ambulatory Visit: Payer: Self-pay

## 2024-10-08 ENCOUNTER — Telehealth

## 2024-10-08 DIAGNOSIS — J069 Acute upper respiratory infection, unspecified: Secondary | ICD-10-CM

## 2024-10-08 DIAGNOSIS — J399 Disease of upper respiratory tract, unspecified: Secondary | ICD-10-CM

## 2024-10-08 MED ORDER — DOXYCYCLINE HYCLATE 100 MG PO TABS: 100.0000 mg | ORAL_TABLET | Freq: Two times a day (BID) | ORAL | 0 refills | Status: AC

## 2024-10-08 MED ORDER — PREDNISONE 10 MG (21) PO TBPK: ORAL_TABLET | ORAL | 0 refills | Status: AC

## 2024-10-08 MED ORDER — PROMETHAZINE-DM 6.25-15 MG/5ML PO SYRP: 5.0000 mL | ORAL_SOLUTION | Freq: Four times a day (QID) | ORAL | 0 refills | Status: AC | PRN

## 2024-10-08 NOTE — Progress Notes (Addendum)
 "  Virtual Visit via Video Note  I connected with Katelyn Kelly on 10/09/24 at 11:00 AM EST by a video enabled telemedicine application and verified that I am speaking with the correct person using two identifiers.  Patient Location: Home Provider Location: Office/Clinic  I discussed the limitations, risks, security, and privacy concerns of performing an evaluation and management service by video and the availability of in person appointments. I also discussed with the patient that there may be a patient responsible charge related to this service. The patient expressed understanding and agreed to proceed.  Subjective: PCP: [name], FNP  Chief Complaint  Patient presents with   Medical Management of Chronic Issues    Cough and congestion for the past 9 days    HPI  Discussed the use of AI scribe software for clinical note transcription with the patient, who gave verbal consent to proceed.  History of Present Illness    Katelyn Kelly is a 62 year old female who presents with symptoms of a cold persisting for nine days.  Upper respiratory symptoms - Persistent cough for nine days - Sore throat for nine days - Loss of taste for nine days - Frequent rhinorrhea causing nasal soreness - Excessive tearing from the eyes for nine days  Constitutional symptoms - Alternating sensations of feeling very hot and very cold for nine days  Chest and musculoskeletal pain - Pain across the chest and shoulder blades  Dietary changes and oral intake - Consuming mostly broth and yogurt due to inability to tolerate other foods  Symptomatic treatment - Using 'coughing code HBP' medication recommended by pharmacy, initially effective but no longer providing relief  Infectious disease testing - COVID test performed at home was negative - Uncertain if the test was a combined flu and COVID test     ROS: Per HPI  Current Outpatient Medications:    ALPRAZolam  (XANAX ) 1 MG tablet,  Take 0.5-1 tablets (0.5-1 mg total) by mouth 3 (three) times daily as needed for anxiety or sleep., Disp: 75 tablet, Rfl: 0   aspirin  EC 81 MG tablet, Take 1 tablet (81 mg total) by mouth daily. Swallow whole., Disp: , Rfl:    atorvastatin  (LIPITOR ) 40 MG tablet, Take 1 tablet (40 mg total) by mouth daily., Disp: 90 tablet, Rfl: 3   busPIRone  (BUSPAR ) 7.5 MG tablet, Take by mouth See admin instructions. Take 0.5 tablets (3.75 mg total) by mouth 3 times daily for 7 days, THEN 1 tablet (7.5 mg total) 3 times daily for 7 days, THEN 2 tablets (15 mg total) 3 times daily, Disp: , Rfl:    busPIRone  (BUSPAR ) 7.5 MG tablet, Take 1 tablet by mouth 3 (three) times daily as needed., Disp: , Rfl:    Continuous Glucose Sensor (FREESTYLE LIBRE 3 PLUS SENSOR) MISC, Change sensor every 15 days., Disp: 6 each, Rfl: 3   doxycycline  (VIBRA -TABS) 100 MG tablet, Take 1 tablet (100 mg total) by mouth 2 (two) times daily for 7 days., Disp: 14 tablet, Rfl: 0   Fremanezumab -vfrm (AJOVY ) 225 MG/1.5ML SOAJ, Inject 225 mg into the skin every 30 (thirty) days., Disp: 1.68 mL, Rfl: 11   furosemide  (LASIX ) 20 MG tablet, Take 1 tablet (20 mg total) by mouth 2 (two) times daily., Disp: 180 tablet, Rfl: 1   glucose blood (ACCU-CHEK GUIDE TEST) test strip, Use as instructed to monitor glucose 4 times daily, Disp: 300 each, Rfl: 12   HYDROcodone -acetaminophen  (NORCO) 7.5-325 MG tablet, Take 1 tablet by mouth  every 6 (six) hours as needed for moderate pain (pain score 4-6)., Disp: 30 tablet, Rfl: 0   insulin  degludec (TRESIBA ) 100 UNIT/ML FlexTouch Pen, Inject 30 Units into the skin at bedtime. Inject 30 units daily at bedtime as instructed by the provider., Disp: 27 mL, Rfl: 3   insulin  lispro (HUMALOG ) 100 UNIT/ML KwikPen, Inject 8-14 Units into the skin 3 (three) times daily. Patient is to inject 5-11 units per sliding scale as directed by the provider., Disp: 40 mL, Rfl: 3   ipratropium (ATROVENT  HFA) 17 MCG/ACT inhaler, Inhale 2 puffs  into the lungs every 6 (six) hours as needed for wheezing., Disp: 12.9 g, Rfl: 3   isosorbide  mononitrate (IMDUR ) 60 MG 24 hr tablet, Take 1 tablet (60 mg total) by mouth daily., Disp: 90 tablet, Rfl: 0   linaclotide  (LINZESS ) 290 MCG CAPS capsule, Take 1 capsule (290 mcg total) by mouth daily., Disp: 30 capsule, Rfl: 11   linaclotide  (LINZESS ) 290 MCG CAPS capsule, Take 1 capsule (290 mcg total) by mouth daily before breakfast., Disp: , Rfl:    meloxicam  (MOBIC ) 7.5 MG tablet, Take 1 tablet (7.5 mg total) by mouth daily., Disp: 30 tablet, Rfl: 0   Menthol, Topical Analgesic, (BIOFREEZE) 10 % LIQD, Apply 1 application topically daily as needed (pain)., Disp: , Rfl:    methocarbamol  (ROBAXIN ) 750 MG tablet, Take 1-2 tablets (750-1,500 mg total) by mouth at bedtime., Disp: 30 tablet, Rfl: 2   metoprolol  tartrate (LOPRESSOR ) 25 MG tablet, TAKE (1/2) TABLET BY MOUTH TWICE DAILY, Disp: 90 tablet, Rfl: 3   nitroGLYCERIN  (NITROSTAT ) 0.4 MG SL tablet, PLACE ONE TABLET UNDER THE TOUNGE EVERY FIVE MINUTES FOR 3 DOSES AS NEEDED FOR CHEST PAINS. (Patient taking differently: As needed), Disp: 25 tablet, Rfl: 3   NURTEC 75 MG TBDP, TAKE ONE TABLET DAILY AS NEEDED., Disp: 8 tablet, Rfl: 6   ondansetron  (ZOFRAN -ODT) 4 MG disintegrating tablet, Take 1 tablet (4 mg total) by mouth every 8 (eight) hours as needed for nausea or vomiting., Disp: 30 tablet, Rfl: 2   predniSONE  (STERAPRED UNI-PAK 21 TAB) 10 MG (21) TBPK tablet, Take according to package instructions., Disp: 21 tablet, Rfl: 0   promethazine -dextromethorphan (PROMETHAZINE -DM) 6.25-15 MG/5ML syrup, Take 5 mLs by mouth 4 (four) times daily as needed., Disp: 118 mL, Rfl: 0   SURE COMFORT PEN NEEDLES 32G X 4 MM MISC, Use one test strip to check blood sugars 4 times a day as instructed by the provider., Disp: 200 each, Rfl: 2  Observations/Objective: There were no vitals filed for this visit. Physical Exam Constitutional:      General: She is not in acute  distress.    Appearance: She is ill-appearing.  Neurological:     Mental Status: She is oriented to person, place, and time.    Assessment and Plan: Upper respiratory tract infection, unspecified type Assessment & Plan: - Prescribed antibiotics. - Prescribed steroids. - Prescribed promethazine  for cough. - Advised use of Vaseline or Neosporin for nasal soreness. - Recommended use of a humidifier at night.  Orders: -     predniSONE ; Take according to package instructions.  Dispense: 21 tablet; Refill: 0 -     Doxycycline  Hyclate; Take 1 tablet (100 mg total) by mouth 2 (two) times daily for 7 days.  Dispense: 14 tablet; Refill: 0 -     Promethazine -DM; Take 5 mLs by mouth 4 (four) times daily as needed.  Dispense: 118 mL; Refill: 0   Total time spent on visit: 8  minutes   Follow Up Instructions: No follow-ups on file.   I discussed the assessment and treatment plan with the patient. The patient was provided an opportunity to ask questions, and all were answered. The patient agreed with the plan and demonstrated an understanding of the instructions.   The patient was advised to call back or seek an in-person evaluation if the symptoms worsen or if the condition fails to improve as anticipated.  The above assessment and management plan was discussed with the patient. The patient verbalized understanding of and has agreed to the management plan.   Leita Longs, FNP "

## 2024-10-09 DIAGNOSIS — J069 Acute upper respiratory infection, unspecified: Secondary | ICD-10-CM | POA: Insufficient documentation

## 2024-10-09 NOTE — Assessment & Plan Note (Signed)
-   Prescribed antibiotics. - Prescribed steroids. - Prescribed promethazine  for cough. - Advised use of Vaseline or Neosporin for nasal soreness. - Recommended use of a humidifier at night.

## 2024-10-16 ENCOUNTER — Encounter (HOSPITAL_COMMUNITY): Payer: Self-pay

## 2024-10-16 ENCOUNTER — Emergency Department (HOSPITAL_COMMUNITY)
Admission: EM | Admit: 2024-10-16 | Discharge: 2024-10-16 | Disposition: A | Discharge status: Home / Self Care | Attending: Emergency Medicine | Admitting: Emergency Medicine

## 2024-10-16 ENCOUNTER — Other Ambulatory Visit: Payer: Self-pay

## 2024-10-16 DIAGNOSIS — Z794 Long term (current) use of insulin: Secondary | ICD-10-CM | POA: Insufficient documentation

## 2024-10-16 DIAGNOSIS — Z79899 Other long term (current) drug therapy: Secondary | ICD-10-CM | POA: Insufficient documentation

## 2024-10-16 DIAGNOSIS — E119 Type 2 diabetes mellitus without complications: Secondary | ICD-10-CM | POA: Insufficient documentation

## 2024-10-16 DIAGNOSIS — G8929 Other chronic pain: Secondary | ICD-10-CM | POA: Diagnosis not present

## 2024-10-16 DIAGNOSIS — M5441 Lumbago with sciatica, right side: Secondary | ICD-10-CM | POA: Diagnosis not present

## 2024-10-16 DIAGNOSIS — Z7982 Long term (current) use of aspirin: Secondary | ICD-10-CM | POA: Insufficient documentation

## 2024-10-16 DIAGNOSIS — M5442 Lumbago with sciatica, left side: Secondary | ICD-10-CM | POA: Diagnosis not present

## 2024-10-16 DIAGNOSIS — M545 Low back pain, unspecified: Secondary | ICD-10-CM | POA: Diagnosis present

## 2024-10-16 MED ORDER — KETOROLAC TROMETHAMINE 15 MG/ML IJ SOLN: 15.0000 mg | Freq: Once | INTRAMUSCULAR | Status: DC

## 2024-10-16 MED ORDER — OXYCODONE HCL 5 MG PO TABS
5.0000 mg | ORAL_TABLET | Freq: Once | ORAL | Status: AC
  Administered 2024-10-16: 12:00:00 5 mg via ORAL
  Filled 2024-10-16: qty 1

## 2024-10-16 MED ORDER — OXYCODONE HCL 5 MG PO TABS: 5.0000 mg | ORAL_TABLET | Freq: Four times a day (QID) | ORAL | 0 refills | Status: DC | PRN

## 2024-10-16 MED ORDER — KETOROLAC TROMETHAMINE 15 MG/ML IJ SOLN
15.0000 mg | Freq: Once | INTRAMUSCULAR | Status: AC
  Administered 2024-10-16: 12:00:00 15 mg via INTRAMUSCULAR
  Filled 2024-10-16: qty 1

## 2024-10-16 NOTE — Discharge Instructions (Addendum)
 You are seen for worsening of your chronic back pain and leg pain.  Is important to follow-up with your PCP and you can discuss with orthopedics since neurosurgery was not able to offer intervention at this time.  We have given you some stronger pain medication for your pain is worse, do not mix this with the Norco you already have at home.  Come back to the ER for new or worsening symptoms.

## 2024-10-16 NOTE — ED Triage Notes (Signed)
 Pt arrived via POV c/o recurrent lower back pain that radiates down her legs. Pt reports her legs gave out this morning and caused her to fall. Pt reports speaking to her PCP and being told her back pain is related to a food allergy and Pt requests a second opinion.

## 2024-10-16 NOTE — ED Provider Notes (Signed)
 [place] EMERGENCY DEPARTMENT AT [facility] Provider Note   CSN: [id number] Arrival date & time: 10/16/24  1038     Patient presents with: Back Pain   Katelyn Kelly is a 63 y.o. female.  Of uncontrolled diabetes, chronic back pain, depression.  This ER for continued worsening low back pain that radiates into bilateral legs described as a shocking sensation.  Has been ongoing for a year gradually worsening.  States today when trying to walk it was very painful and she fell down, denies any trauma.  She was able to get back up.  Did not hit her head, no dizziness or loss of consciousness.  She has followed up with neurosurgery but states that due to her uncontrolled diabetes they will not do surgery until her diabetes is under control.  She takes for pain which has been not adequately controlling her pain.  She also has taken muscle relaxer's, recently completed a steroid course which she states made her sugars even higher, and has been using over-the-counter lidocaine  patches without relief.  No saddle anesthesia or paresthesia, no bowel or bladder incontinence, no flank pain, no fever or chills, no IVDA.  He was seen at PCP recently because the pain was worse in her right leg, they did ultrasound which ruled out DVT.  Patient states that they told her it could have been possibly a food allergy, the records reflect that they told her it was likely some chronic venous insufficiency and mild volume overload and prescribed furosemide .      Back Pain      Prior to Admission medications  Medication Sig Start Date End Date Taking? Authorizing Provider  oxyCODONE  (ROXICODONE ) 5 MG immediate release tablet Take 1 tablet (5 mg total) by mouth every 6 (six) hours as needed for severe pain (pain score 7-10). 10/16/24  Yes Hitesh Fouche A, PA-C  ALPRAZolam  (XANAX ) 1 MG tablet Take 0.5-1 tablets (0.5-1 mg total) by mouth 3 (three) times daily as needed for anxiety or sleep. 10/01/24    [name], FNP  aspirin  EC 81 MG tablet Take 1 tablet (81 mg total) by mouth daily. Swallow whole. 05/20/22   Alvan Dorn FALCON, MD  atorvastatin  (LIPITOR ) 40 MG tablet Take 1 tablet (40 mg total) by mouth daily. 09/04/24   [name], FNP  busPIRone  (BUSPAR ) 7.5 MG tablet Take by mouth See admin instructions. Take 0.5 tablets (3.75 mg total) by mouth 3 times daily for 7 days, THEN 1 tablet (7.5 mg total) 3 times daily for 7 days, THEN 2 tablets (15 mg total) 3 times daily 06/22/21   [provider]  busPIRone  (BUSPAR ) 7.5 MG tablet Take 1 tablet by mouth 3 (three) times daily as needed. 02/08/24   [provider]  Continuous Glucose Sensor (FREESTYLE LIBRE 3 PLUS SENSOR) MISC Change sensor every 15 days. 09/10/24   Therisa Benton PARAS, NP  Fremanezumab -vfrm (AJOVY ) 225 MG/1.5ML SOAJ Inject 225 mg into the skin every 30 (thirty) days. 01/31/24   Whitfield Raisin, NP  furosemide  (LASIX ) 20 MG tablet Take 1 tablet (20 mg total) by mouth 2 (two) times daily. 09/06/24 12/05/24  [name], FNP  glucose blood (ACCU-CHEK GUIDE TEST) test strip Use as instructed to monitor glucose 4 times daily 09/10/24   Therisa Benton PARAS, NP  HYDROcodone -acetaminophen  (NORCO) 7.5-325 MG tablet Take 1 tablet by mouth every 6 (six) hours as needed for moderate pain (pain score 4-6). 10/01/24   [name], FNP  insulin  degludec (TRESIBA ) 100  UNIT/ML FlexTouch Pen Inject 30 Units into the skin at bedtime. Inject 30 units daily at bedtime as instructed by the provider. 09/10/24   Therisa Benton PARAS, NP  insulin  lispro (HUMALOG ) 100 UNIT/ML KwikPen Inject 8-14 Units into the skin 3 (three) times daily. Patient is to inject 5-11 units per sliding scale as directed by the provider. 09/10/24   Therisa Benton PARAS, NP  ipratropium (ATROVENT  HFA) 17 MCG/ACT inhaler Inhale 2 puffs into the lungs every 6 (six) hours as needed for wheezing. 09/06/24   [name], FNP  isosorbide  mononitrate (IMDUR ) 60 MG 24 hr  tablet Take 1 tablet (60 mg total) by mouth daily. 09/02/24   Alvan Dorn FALCON, MD  linaclotide  (LINZESS ) 290 MCG CAPS capsule Take 1 capsule (290 mcg total) by mouth daily. 08/02/24   Ezzard Sonny RAMAN, PA-C  linaclotide  (LINZESS ) 290 MCG CAPS capsule Take 1 capsule (290 mcg total) by mouth daily before breakfast. 08/02/24   Ezzard Sonny RAMAN, PA-C  meloxicam  (MOBIC ) 7.5 MG tablet Take 1 tablet (7.5 mg total) by mouth daily. 09/17/24   Patel, Rutwik K, MD  Menthol, Topical Analgesic, (BIOFREEZE) 10 % LIQD Apply 1 application topically daily as needed (pain).    [provider]  methocarbamol  (ROBAXIN ) 750 MG tablet Take 1-2 tablets (750-1,500 mg total) by mouth at bedtime. 06/07/24   Kirsteins, Prentice BRAVO, MD  metoprolol  tartrate (LOPRESSOR ) 25 MG tablet TAKE (1/2) TABLET BY MOUTH TWICE DAILY 07/27/23   Alvan Dorn FALCON, MD  nitroGLYCERIN  (NITROSTAT ) 0.4 MG SL tablet PLACE ONE TABLET UNDER THE TOUNGE EVERY FIVE MINUTES FOR 3 DOSES AS NEEDED FOR CHEST PAINS. Patient taking differently: As needed 06/12/24   Alvan Dorn FALCON, MD  NURTEC 75 MG TBDP TAKE ONE TABLET DAILY AS NEEDED. 08/07/23   Penumalli, Vikram R, MD  ondansetron  (ZOFRAN -ODT) 4 MG disintegrating tablet Take 1 tablet (4 mg total) by mouth every 8 (eight) hours as needed for nausea or vomiting. 05/28/20   Rudy Josette RAMAN, PA-C  predniSONE  (STERAPRED UNI-PAK 21 TAB) 10 MG (21) TBPK tablet Take according to package instructions. 10/08/24   [name], FNP  promethazine -dextromethorphan (PROMETHAZINE -DM) 6.25-15 MG/5ML syrup Take 5 mLs by mouth 4 (four) times daily as needed. 10/08/24   [name], FNP  SURE COMFORT PEN NEEDLES 32G X 4 MM MISC Use one test strip to check blood sugars 4 times a day as instructed by the provider. 09/10/24   Therisa Benton PARAS, NP    Allergies: Meprobamate, Soliqua [insulin  glargine-lixisenatide], Tranxene [clorazepate dipotassium], Albuterol , Doxepin hcl, Haloperidol, Sertraline, Vortioxetine, and Elavil  [amitriptyline]    Review of Systems  Musculoskeletal:  Positive for back pain.    Updated Vital Signs BP 107/63   Pulse 96   Temp 98 F (36.7 C) (Temporal)   Resp 18   Ht 5' 1 (1.549 m)   Wt 83.5 kg   SpO2 100%   BMI 34.78 kg/m   Physical Exam Vitals and nursing note reviewed.  Constitutional:      General: She is not in acute distress.    Appearance: She is well-developed.  HENT:     Head: Normocephalic and atraumatic.     Mouth/Throat:     Mouth: Mucous membranes are moist.  Eyes:     Extraocular Movements: Extraocular movements intact.     Conjunctiva/sclera: Conjunctivae normal.     Pupils: Pupils are equal, round, and reactive to light.  Cardiovascular:     Rate and Rhythm: Normal rate and regular rhythm.  Heart sounds: No murmur heard. Pulmonary:     Effort: Pulmonary effort is normal. No respiratory distress.     Breath sounds: Normal breath sounds.  Abdominal:     Palpations: Abdomen is soft.     Tenderness: There is no abdominal tenderness.  Musculoskeletal:        General: No swelling.     Cervical back: Neck supple.  Skin:    General: Skin is warm and dry.     Capillary Refill: Capillary refill takes less than 2 seconds.  Neurological:     General: No focal deficit present.     Mental Status: She is alert and oriented to person, place, and time.     Cranial Nerves: No cranial nerve deficit.     Sensory: No sensory deficit.     Motor: No weakness.     Deep Tendon Reflexes:     Reflex Scores:      Patellar reflexes are 2+ on the right side and 2+ on the left side.    Comments: Gait is antalgic, the patient is able to get up from a chair, walk to bed and go to the bed independently.  Psychiatric:        Mood and Affect: Mood normal.     (all labs ordered are listed, but only abnormal results are displayed) Labs Reviewed - No data to display  EKG: None  Radiology: No results found.   Procedures   Medications Ordered in the ED   ketorolac  (TORADOL ) 15 MG/ML injection 15 mg (15 mg Intramuscular Given 10/16/24 1211)  oxyCODONE  (Oxy IR/ROXICODONE ) immediate release tablet 5 mg (5 mg Oral Given 10/16/24 1210)                                    Medical Decision Making This patient presents to the ED for concern of worsening chronic low back pain, this involves an extensive number of treatment options, and is a complaint that carries with it a high risk of complications and morbidity.  The differential diagnosis includes DDD, HNP, spinal stenosis, cauda equina, spinal epidural abscess, muscle strain, ovarian   Co morbidities that complicate the patient evaluation  Diabetes, chronic back pain   Additional history obtained:  Additional history obtained from EMR External records from outside source obtained and reviewed including prior notes and labs       Problem List / ED Course / Critical interventions / Medication management  Here with gradually worsening pain rating down bilateral legs that has been ongoing for a year and that has been gradually worsening.  She has been seen by neurosurgery and states she was told that her diabetes is too poorly controlled for any surgery.  She saw her PCP who checked her for a possible DVT in her leg which was negative.  She is no longer having swelling in her legs, she states he is having sharp pains shooting down from her back into her legs.  No saddle esthesia or paresthesia, no bowel or bladder incontinence, no fevers or chills or weight loss or other infectious symptoms.  I have very low suspicion for infectious cause such as abscess, very low suspicion for cauda equina.  Patient is able to ambulate.  She is feeling better after Toradol  and oxycodone .  She used the hydrocodone  at home with only minimal relief.  Will give short course of oxycodone  at home.  Will avoid steroids as  she states that Eksir sugars go very high and she did not want to try this.  Also advised Ortho  follow-up as they could potentially do procedure such as injection for her symptoms.  She was given strict return precautions. I ordered medication as above Reevaluation of the patient after these medicines showed that the patient improved I have reviewed the patients home medicines and have made adjustments as needed      Risk Prescription drug management.        Final diagnoses:  Chronic bilateral low back pain with bilateral sciatica    ED Discharge Orders          Ordered    oxyCODONE  (ROXICODONE ) 5 MG immediate release tablet  Every 6 hours PRN        10/16/24 [address] A, PA-C 10/16/24 1620

## 2024-10-17 ENCOUNTER — Ambulatory Visit: Payer: Self-pay

## 2024-10-17 NOTE — Telephone Encounter (Signed)
 FYI Only or Action Required?: FYI only for provider: appointment scheduled on 10/18/24.  Patient was last seen in primary care on 10/08/2024 by [name], FNP.  Called Nurse Triage reporting Leg Pain.  Symptoms began 1 year ago.  Interventions attempted: Other: Seen in ED. Toradol  shot, oxycodone  5mg .  Symptoms are: stable.  Triage Disposition: See PCP When Office is Open (Within 3 Days)  Patient/caregiver understands and will follow disposition?: Yes   Reason for Disposition  [1] MODERATE pain (e.g., interferes with normal activities, limping) AND [2] present > 3 days  Answer Assessment - Initial Assessment Questions Chronic back pain. Patient states diabetic doctor cannot see pt until March. Seen in ED yesterday. ED doctor recommended Orthopedic surgeon in [place] and to be back on Oxycodone  5mg .  1. ONSET: When did the pain start?      Year ago, when trying to move a wardrobe  2. LOCATION: Where is the pain located?      Both legs  3. PAIN: How bad is the pain?    (Scale 1-10; or mild, moderate, severe)     Constant electric shock 9/10 today. Reports 14/10 yesterday  4. CAUSE: What do you think is causing the leg pain?     Moved furniture 1 year ago  5. OTHER SYMPTOMS: Do you have any other symptoms? (e.g., chest pain, back pain, breathing difficulty, swelling, rash, fever, numbness, weakness)     Stomach swells up, blood sugar problems.  Protocols used: Leg Pain-A-AH  Copied from CRM [id number]. Topic: Clinical - Red Word Triage >> Oct 17, 2024  2:21 PM Montie POUR wrote: Red Word that prompted transfer to Nurse Triage:  I was trying to schedule an Emergency room follow up - She did fall yesterday morning and this sent her to the emergency room. She did not hit her head. She fell on her leg. Pain level today is a 9.

## 2024-10-17 NOTE — Telephone Encounter (Signed)
 Noted

## 2024-10-18 ENCOUNTER — Encounter: Payer: Self-pay | Admitting: Internal Medicine

## 2024-10-18 ENCOUNTER — Ambulatory Visit: Payer: Self-pay | Admitting: Internal Medicine

## 2024-10-18 VITALS — BP 100/67 | HR 82 | Ht 61.0 in | Wt 189.0 lb

## 2024-10-18 DIAGNOSIS — Z09 Encounter for follow-up examination after completed treatment for conditions other than malignant neoplasm: Secondary | ICD-10-CM | POA: Diagnosis not present

## 2024-10-18 DIAGNOSIS — M51372 Other intervertebral disc degeneration, lumbosacral region with discogenic back pain and lower extremity pain: Secondary | ICD-10-CM | POA: Diagnosis not present

## 2024-10-18 DIAGNOSIS — Z794 Long term (current) use of insulin: Secondary | ICD-10-CM

## 2024-10-18 DIAGNOSIS — E118 Type 2 diabetes mellitus with unspecified complications: Secondary | ICD-10-CM | POA: Diagnosis not present

## 2024-10-18 MED ORDER — PREGABALIN 50 MG PO CAPS: 50.0000 mg | ORAL_CAPSULE | Freq: Two times a day (BID) | ORAL | 0 refills | Status: DC

## 2024-10-18 MED ORDER — HYDROCODONE-ACETAMINOPHEN 7.5-325 MG PO TABS: 1.0000 | ORAL_TABLET | Freq: Four times a day (QID) | ORAL | 0 refills | Status: DC | PRN

## 2024-10-18 NOTE — Assessment & Plan Note (Signed)
 Chronic low back pain with bilateral radicular symptoms Recently has severe RLE pain Norco as needed for severe pain, refilled - did not tolerate Oxycodone  Added Lyrica  at a lower dose of 50 mg BID Meloxicam  as needed for mild to moderate pain -would avoid NSAID for longer period due to her history of GERD Avoid heavy lifting and frequent bending Heating pad and/or back brace as needed

## 2024-10-18 NOTE — Progress Notes (Signed)
 "  Acute Office Visit  Subjective:    Patient ID: Katelyn Kelly, female    DOB: 11-Mar-1961, 63 y.o.   MRN: [id number]  Chief Complaint  Patient presents with   Pain    Pt reports chronic pain in legs and back.   Letter for School/Work    Needs a letter to excempt her from jury duty on 11/11/2024.    Blood Sugar Problem    Is having issues with her diabetes.    HPI Patient is in today for follow up of recent ER visit for acute on chronic low back pain on 10/16/24.  She had worsening of low back pain, that radiates into bilateral LE with electric shocklike sensation.  She has difficulty walking and standing for more than 15 minutes due to severe low back pain.  She was given Norco as needed for severe pain.  Of note, she recently completed oral prednisone  taper as well.  She was given oxycodone  from ER, but has severe drowsiness with it.  She has run out of Lyrica , which was given to her by PCP for neuropathic pain.  She has been evaluated by [facility] for low back pain, but she is not surgical candidate currently due to uncontrolled type II DM.  She requests jury duty exemption letter, which was provided today.  Type II DM: She has CGM, which shows mostly postmeal hyperglycemic profile.  She is followed by endocrinology, and takes Tresiba  30 units QD with ISS.  Her last HbA1c was 9.0 in 11/25, which is apparently better than prior.    Past Medical History:  Diagnosis Date   Anxiety and depression    Bilateral carpal tunnel syndrome    Chronic back pain    Chronic neck pain    Depression    Diabetes mellitus    GERD (gastroesophageal reflux disease)    H/O syncope    Headache    History of panic attacks    Hypertension    IBS (irritable bowel syndrome)    Manic depression (HCC)    Migraine    Panic attack     Past Surgical History:  Procedure Laterality Date   ABDOMINAL HYSTERECTOMY     ABDOMINAL SURGERY     APPENDECTOMY     BIOPSY  09/05/2017    Procedure: BIOPSY;  Surgeon: Harvey Margo CROME, MD;  Location: [facility];  Service: Endoscopy;;  random colon   BIOPSY  05/01/2018   Procedure: BIOPSY;  Surgeon: Harvey Margo CROME, MD;  Location: [facility];  Service: Endoscopy;;  gastric biopsy   CARPAL TUNNEL RELEASE Bilateral    CHOLECYSTECTOMY     COLONOSCOPY WITH PROPOFOL  N/A 09/05/2017   Procedure: COLONOSCOPY WITH PROPOFOL ;  Surgeon: Harvey Margo CROME, MD; normal TI, 5 small polyps, diverticulosis in the cecum, internal and external hemorrhoids.  Random colon biopsies benign.  Colon polyps were hyperplastic.  Repeat in 2023.   CORONARY BALLOON ANGIOPLASTY N/A 06/28/2021   Procedure: CORONARY BALLOON ANGIOPLASTY;  Surgeon: Wonda Sharper, MD;  Location: [facility] INVASIVE CV LAB;  Service: Cardiovascular;  Laterality: N/A;   DILATION AND CURETTAGE OF UTERUS     ESOPHAGOGASTRODUODENOSCOPY (EGD) WITH PROPOFOL  N/A 05/01/2018   Procedure: ESOPHAGOGASTRODUODENOSCOPY (EGD) WITH PROPOFOL ;  Surgeon: Harvey Margo CROME, MD;  normal esophagus, small hiatal hernia, mild gastritis s/p biopsy, normal duodenum.  No H. pylori.    HERNIA REPAIR     INCISIONAL HERNIA REPAIR N/A 11/06/2020   Procedure: HERNIA REPAIR INCISIONAL, PRIMARY REPAIR;  Surgeon: Kallie Manuelita BROCKS, MD;  Location: [facility] OR[facility];  Service: General;  Laterality: N/A;   LEFT HEART CATH AND CORONARY ANGIOGRAPHY N/A 06/28/2021   Procedure: LEFT HEART CATH AND CORONARY ANGIOGRAPHY;  Surgeon: Wonda Sharper, MD;  Location: [facility] INVASIVE CV LAB;  Service: Cardiovascular;  Laterality: N/A;   POLYPECTOMY  09/05/2017   Procedure: POLYPECTOMY;  Surgeon: Harvey Margo CROME, MD;  Location: [facility];  Service: Endoscopy;;  sigmoid and rectal   TUBAL LIGATION      Family History  Problem Relation Age of Onset   Diabetes Mother    Stroke Mother    Depression Mother    Lung cancer Mother    Colon cancer Father 60       Passed away from colon cancer   Colon cancer Brother 72       Passed away from colon  cancer   Bipolar disorder Son     Social History   Socioeconomic History   Marital status: Legally Separated    Spouse name: Not on file   Number of children: Not on file   Years of education: Not on file   Highest education level: Not on file  Occupational History   Not on file  Tobacco Use   Smoking status: Every Day    Current packs/day: 1.00    Average packs/day: 1 pack/day for 50.0 years (50.0 ttl pk-yrs)    Types: Cigarettes    Start date: 1976   Smokeless tobacco: Never   Tobacco comments:    one pack a day. Pt has purchased nicotine  patches. She has a plan to stop and is going to set a quit date.   Vaping Use   Vaping status: Never Used  Substance and Sexual Activity   Alcohol use: No   Drug use: No   Sexual activity: Not Currently    Birth control/protection: Surgical  Other Topics Concern   Not on file  Social History Narrative   Not on file   Social Drivers of Health   Tobacco Use: High Risk (10/18/2024)   Patient History    Smoking Tobacco Use: Every Day    Smokeless Tobacco Use: Never    Passive Exposure: Not on file  Financial Resource Strain: Not on file  Food Insecurity: No Food Insecurity (09/09/2024)   Epic    Worried About Programme Researcher, Broadcasting/film/video in the Last Year: Never true    Ran Out of Food in the Last Year: Never true  Transportation Needs: No Transportation Needs (09/09/2024)   Epic    Lack of Transportation (Medical): No    Lack of Transportation (Non-Medical): No  Physical Activity: Insufficiently Active (09/09/2024)   Exercise Vital Sign    Days of Exercise per Week: 7 days    Minutes of Exercise per Session: 20 min  Stress: No Stress Concern Present (09/09/2024)   [employer] of Occupational Health - Occupational Stress Questionnaire    Feeling of Stress: Not at all  Social Connections: Patient Declined (09/09/2024)   Social Connection and Isolation Panel    Frequency of Communication with Friends and Family: Patient declined     Frequency of Social Gatherings with Friends and Family: Patient declined    Attends Religious Services: Patient declined    Active Member of Clubs or Organizations: Patient declined    Attends Banker Meetings: Patient declined    Marital Status: Patient declined  Intimate Partner Violence: Not At Risk (09/09/2024)   Epic    Fear  of Current or Ex-Partner: No    Emotionally Abused: No    Physically Abused: No    Sexually Abused: No  Depression (PHQ2-9): Low Risk (10/18/2024)   Depression (PHQ2-9)    PHQ-2 Score: 0  Alcohol Screen: Not on file  Housing: Low Risk (09/09/2024)   Epic    Unable to Pay for Housing in the Last Year: No    Number of Times Moved in the Last Year: 1    Homeless in the Last Year: No  Utilities: Not At Risk (09/09/2024)   Epic    Threatened with loss of utilities: No  Health Literacy: Adequate Health Literacy (09/09/2024)   B1300 Health Literacy    Frequency of need for help with medical instructions: Never    Outpatient Medications Prior to Visit  Medication Sig Dispense Refill   ALPRAZolam  (XANAX ) 1 MG tablet Take 0.5-1 tablets (0.5-1 mg total) by mouth 3 (three) times daily as needed for anxiety or sleep. 75 tablet 0   aspirin  EC 81 MG tablet Take 1 tablet (81 mg total) by mouth daily. Swallow whole.     atorvastatin  (LIPITOR ) 40 MG tablet Take 1 tablet (40 mg total) by mouth daily. 90 tablet 3   busPIRone  (BUSPAR ) 7.5 MG tablet Take by mouth See admin instructions. Take 0.5 tablets (3.75 mg total) by mouth 3 times daily for 7 days, THEN 1 tablet (7.5 mg total) 3 times daily for 7 days, THEN 2 tablets (15 mg total) 3 times daily     busPIRone  (BUSPAR ) 7.5 MG tablet Take 1 tablet by mouth 3 (three) times daily as needed.     Continuous Glucose Sensor (FREESTYLE LIBRE 3 PLUS SENSOR) MISC Change sensor every 15 days. 6 each 3   Fremanezumab -vfrm (AJOVY ) 225 MG/1.5ML SOAJ Inject 225 mg into the skin every 30 (thirty) days. 1.68 mL 11    furosemide  (LASIX ) 20 MG tablet Take 1 tablet (20 mg total) by mouth 2 (two) times daily. 180 tablet 1   glucose blood (ACCU-CHEK GUIDE TEST) test strip Use as instructed to monitor glucose 4 times daily 300 each 12   insulin  degludec (TRESIBA ) 100 UNIT/ML FlexTouch Pen Inject 30 Units into the skin at bedtime. Inject 30 units daily at bedtime as instructed by the provider. 27 mL 3   insulin  lispro (HUMALOG ) 100 UNIT/ML KwikPen Inject 8-14 Units into the skin 3 (three) times daily. Patient is to inject 5-11 units per sliding scale as directed by the provider. 40 mL 3   ipratropium (ATROVENT  HFA) 17 MCG/ACT inhaler Inhale 2 puffs into the lungs every 6 (six) hours as needed for wheezing. 12.9 g 3   isosorbide  mononitrate (IMDUR ) 60 MG 24 hr tablet Take 1 tablet (60 mg total) by mouth daily. 90 tablet 0   linaclotide  (LINZESS ) 290 MCG CAPS capsule Take 1 capsule (290 mcg total) by mouth daily. 30 capsule 11   linaclotide  (LINZESS ) 290 MCG CAPS capsule Take 1 capsule (290 mcg total) by mouth daily before breakfast.     meloxicam  (MOBIC ) 7.5 MG tablet Take 1 tablet (7.5 mg total) by mouth daily. 30 tablet 0   Menthol, Topical Analgesic, (BIOFREEZE) 10 % LIQD Apply 1 application topically daily as needed (pain).     methocarbamol  (ROBAXIN ) 750 MG tablet Take 1-2 tablets (750-1,500 mg total) by mouth at bedtime. 30 tablet 2   metoprolol  tartrate (LOPRESSOR ) 25 MG tablet TAKE (1/2) TABLET BY MOUTH TWICE DAILY 90 tablet 3   nitroGLYCERIN  (NITROSTAT ) 0.4 MG SL tablet  PLACE ONE TABLET UNDER THE TOUNGE EVERY FIVE MINUTES FOR 3 DOSES AS NEEDED FOR CHEST PAINS. (Patient taking differently: As needed) 25 tablet 3   NURTEC 75 MG TBDP TAKE ONE TABLET DAILY AS NEEDED. 8 tablet 6   ondansetron  (ZOFRAN -ODT) 4 MG disintegrating tablet Take 1 tablet (4 mg total) by mouth every 8 (eight) hours as needed for nausea or vomiting. 30 tablet 2   promethazine -dextromethorphan (PROMETHAZINE -DM) 6.25-15 MG/5ML syrup Take 5 mLs by  mouth 4 (four) times daily as needed. 118 mL 0   SURE COMFORT PEN NEEDLES 32G X 4 MM MISC Use one test strip to check blood sugars 4 times a day as instructed by the provider. 200 each 2   HYDROcodone -acetaminophen  (NORCO) 7.5-325 MG tablet Take 1 tablet by mouth every 6 (six) hours as needed for moderate pain (pain score 4-6). 30 tablet 0   oxyCODONE  (ROXICODONE ) 5 MG immediate release tablet Take 1 tablet (5 mg total) by mouth every 6 (six) hours as needed for severe pain (pain score 7-10). 10 tablet 0   predniSONE  (STERAPRED UNI-PAK 21 TAB) 10 MG (21) TBPK tablet Take according to package instructions. 21 tablet 0   No facility-administered medications prior to visit.    Allergies[1]  Review of Systems  Constitutional:  Negative for chills and fever.  HENT:  Negative for congestion and sore throat.   Eyes:  Negative for pain and discharge.  Respiratory:  Negative for cough and shortness of breath.   Cardiovascular:  Negative for chest pain and palpitations.  Gastrointestinal:  Negative for abdominal pain, diarrhea, nausea and vomiting.  Endocrine: Negative for polydipsia and polyuria.  Genitourinary:  Negative for dysuria and hematuria.  Musculoskeletal:  Positive for arthralgias, back pain and gait problem. Negative for neck pain and neck stiffness.  Skin:  Negative for rash.  Neurological:  Positive for weakness. Negative for dizziness.  Psychiatric/Behavioral:  Negative for agitation and behavioral problems.        Objective:    Physical Exam Vitals reviewed.  Constitutional:      General: She is not in acute distress.    Appearance: She is not diaphoretic.  HENT:     Head: Normocephalic and atraumatic.     Nose: Nose normal.     Mouth/Throat:     Mouth: Mucous membranes are moist.  Eyes:     General: No scleral icterus.    Extraocular Movements: Extraocular movements intact.  Cardiovascular:     Rate and Rhythm: Normal rate and regular rhythm.     Heart sounds:  Normal heart sounds. No murmur heard. Pulmonary:     Breath sounds: Normal breath sounds. No wheezing or rales.  Musculoskeletal:     Cervical back: Neck supple. No tenderness.     Lumbar back: Tenderness present. Positive right straight leg raise test and positive left straight leg raise test.     Right lower leg: Edema (1+) present.     Left lower leg: Edema (1+) present.  Skin:    General: Skin is warm.     Findings: No rash.  Neurological:     General: No focal deficit present.     Mental Status: She is alert and oriented to person, place, and time.  Psychiatric:        Mood and Affect: Mood is anxious.        Behavior: Behavior is cooperative.     BP 100/67   Pulse 82   Ht 5' 1 (1.549 m)   Wt  189 lb (85.7 kg)   SpO2 94%   BMI 35.71 kg/m  Wt Readings from Last 3 Encounters:  10/18/24 189 lb (85.7 kg)  10/16/24 184 lb 1.4 oz (83.5 kg)  09/17/24 184 lb (83.5 kg)        Assessment & Plan:   Problem List Items Addressed This Visit       Endocrine   Type 2 diabetes mellitus with complication, with long-term current use of insulin  (HCC) (Chronic)   Lab Results  Component Value Date   HGBA1C 9.0 (A) 09/10/2024   Associated with neuropathy Uncontrolled, followed by endocrinology - will defer insulin  treatment to endocrinology On Tresiba  and ISS Advised to follow diabetic diet On statin  Added Lyrica  50 mg BID for neuropathic pain      Relevant Medications   pregabalin  (LYRICA ) 50 MG capsule     Musculoskeletal and Integument   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 with discogenic back pain and leg pain - Primary   Chronic low back pain with bilateral radicular symptoms Recently has severe RLE pain Norco as needed for severe pain, refilled - did not tolerate Oxycodone  Added Lyrica  at a lower dose of 50 mg BID Meloxicam  as needed for mild to moderate pain -would avoid NSAID for longer period due to her history of GERD Avoid heavy lifting and  frequent bending Heating pad and/or back brace as needed      Relevant Medications   HYDROcodone -acetaminophen  (NORCO) 7.5-325 MG tablet     Other   Encounter for examination following treatment at hospital   ER chart reviewed - had acute on chronic low back pain Was given oxycodone  from ER, but she does not tolerate it Prescribed Norco as needed for severe pain        Meds ordered this encounter  Medications   HYDROcodone -acetaminophen  (NORCO) 7.5-325 MG tablet    Sig: Take 1 tablet by mouth every 6 (six) hours as needed for moderate pain (pain score 4-6).    Dispense:  30 tablet    Refill:  0    Pt unable to take the Oxycodone , so we are switching to hydrocodone .  Pt will bring oxycodone  to pharmacy for disposal.  06-26-24   pregabalin  (LYRICA ) 50 MG capsule    Sig: Take 1 capsule (50 mg total) by mouth 2 (two) times daily.    Dispense:  60 capsule    Refill:  0     Chance Karam MARLA Blanch, MD     [1]  Allergies Allergen Reactions   Meprobamate Hives and Swelling   Soliqua [Insulin  Glargine-Lixisenatide] Other (See Comments)    Flu like symptoms , Shortness of breathe   Tranxene [Clorazepate Dipotassium] Hives and Swelling   Albuterol  Other (See Comments)    Patient does not remember the reaction. This was a record provided via [facility]   Doxepin Hcl Other (See Comments)   Haloperidol Other (See Comments)    Patient does not remember the reaction. This was a record provided via [facility]   Sertraline Other (See Comments)    Patient does not remember the reaction. This was a record provided via [facility]   Vortioxetine Other (See Comments)    Patient does not remember the reaction. This was a record provided via [facility]   Elavil [Amitriptyline] Palpitations   "

## 2024-10-18 NOTE — Assessment & Plan Note (Signed)
 ER chart reviewed - had acute on chronic low back pain Was given oxycodone  from ER, but she does not tolerate it Prescribed Norco as needed for severe pain

## 2024-10-18 NOTE — Assessment & Plan Note (Addendum)
 Lab Results  Component Value Date   HGBA1C 9.0 (A) 09/10/2024   Associated with neuropathy Uncontrolled, followed by endocrinology - will defer insulin  treatment to endocrinology On Tresiba  and ISS Advised to follow diabetic diet On statin  Added Lyrica  50 mg BID for neuropathic pain

## 2024-10-18 NOTE — Patient Instructions (Addendum)
 Please start taking Lyrica  50 mg twice daily.  Please take Norco as needed for severe pain. Please do not take Oxycodone .  Please continue to take your insulin  as recommended by Endocrinologist and follow low carb diet.

## 2024-10-22 ENCOUNTER — Telehealth: Payer: Self-pay

## 2024-10-22 NOTE — Telephone Encounter (Signed)
 Copied from CRM #[id number]. Topic: General - Other >> Oct 22, 2024 12:46 PM Sophia H wrote: Reason for CRM: [employer], Message to provider - States they did a health risk assessment with the member, patient was flagged with a high risk for depression. She scored a 6 overall, patient stated to the rep that she did not feel like the medications are doing much for her. If any questions/concerns please reach out to [facility] # [phone] ext [phone]

## 2024-10-27 NOTE — Progress Notes (Unsigned)
 " Cardiology Office Note:  .   Date:  10/30/2024  ID:  Katelyn Kelly, DOB 08-15-1961, MRN [id number] PCP: [name], FNP  [facility] Providers Cardiologist:  None {  History of Present Illness: .   Katelyn Kelly is a 63 y.o. female  with PMHx of CAD, HLD, HTN, DM2, anxiety/depression, migraine headaches (followed by Dr Leila), palpitations who reports to [facility] office for 6 month follow up.   Pertinent cardiac medical history:  CAD  Admitted 05/2021 with NSTEMI. Echo LVEF 60-65%, grade I dd. Cath showed 95% ramus, received PTCA only. Recommended DAPT with ASA and Brilinta  x 12 month.  ER 04/18/2022 for chest pain. Unremarkable work up. Improved with Xanax  06/2023 OV: Reported atypical chest pain with some similarities to prior MI. EKG w/ no significant changes. Planned for exercise nuclear stress test, however did not complete.  Palpitations 12/2022 monitor: rare ectopy, 2 runs SVT longest 10 beats   Last seen in heartcare 12/07/2023 by Dr. Alvan for follow-up.  Reported slight increase in palpitations in the setting of bronchiolitis.  Discussed taking additional blood pressure 12.5 mg as needed.  Also reported mild LE edema in the setting of recent steroid course.  Started Lasix  20 mg as needed.  Also noted that prior chest pain resolved.  She did not complete scheduled stress test from previous visit and discussed holding off on testing since asymptomatic.  Continued on ASA 81 mg daily, Lipitor  40 mg nightly, Imdur  60 mg daily, Lopressor  12.5 mg twice daily, NTG as needed.  Today, reports ongoing intermittent swelling in hands, legs, and feet. She was treated with PO steroids about 3-4 weeks ago for respiratory infection. She is not able to verify if worsening since steroids but notes it was present prior to steroids. No relief with leg elevation.  She takes Lasix  20 mg daily without any significant changes or significant urine output. She admits to high salt consumption   and craving salt. Also reports ongoing unchanged palpitations for year that is not bothersome. Denies chest pain, shortness of breath, syncope, presyncope, dizziness, orthopnea, PND, significant weight changes, acute bleeding, or claudication.  Reports compliance with medications. She is not very active due to back pain and leg pain. She is being evaluated by neurology. She smoke 1 PPD but she is planning to try nicotine  patch.  Denies alcohol/drug use.  ROS: 10 point review of system has been reviewed and considered negative except ones been listed in the HPI.   Studies Reviewed: SABRA   EKG Interpretation Date/Time:  Wednesday October 30 2024 11:09:21 EST Ventricular Rate:  93 PR Interval:  148 QRS Duration:  74 QT Interval:  364 QTC Calculation: 452 R Axis:   74  Text Interpretation: Normal sinus rhythm Right atrial enlargement Low voltage QRS When compared with ECG of 10-Aug-2023 11:23, No significant change was found Confirmed by Sheron Hallmark ([id number]) on 10/30/2024 11:17:01 AM   ECHO 05/2021 IMPRESSIONS  1. Left ventricular ejection fraction, by estimation, is 60 to 65%. The  left ventricle has normal function. The left ventricle has no regional  wall motion abnormalities. Left ventricular diastolic parameters are  consistent with Grade I diastolic  dysfunction (impaired relaxation).   2. Right ventricular systolic function is normal. The right ventricular  size is normal. Tricuspid regurgitation signal is inadequate for assessing  PA pressure.   3. The mitral valve is normal in structure. No evidence of mitral valve  regurgitation. No evidence of mitral stenosis.   4.  The aortic valve is tricuspid. Aortic valve regurgitation is not  visualized. Mild aortic valve sclerosis is present, with no evidence of  aortic valve stenosis.   5. The inferior vena cava is normal in size with greater than 50%  respiratory variability, suggesting right atrial pressure of 3 mmHg.   05/2021 cath   Ramus lesion is 95% stenosed.   Balloon angioplasty was performed using a BALLOON SAPPHIRE 2.0X12.   Post intervention, there is a 10% residual stenosis.   1.  Single-vessel coronary artery disease with severe thrombotic stenosis at the bifurcation of the intermediate branch, treated successfully with balloon angioplasty using a 2 mm balloon.  Intermediate subbranch occlusion noted following angioplasty, likely from distal embolization 2.  Patent left main, LAD, AV circumflex, and RCA without significant stenosis 3.  Normal LV function evaluated by echo with normal LVEDP   Recommend: Aggressive medical therapy, tobacco cessation, dual antiplatelet therapy with aspirin  and ticagrelor  as tolerated for 12 months for treatment of ACS/non-STEMI  CV Studies: Cardiac studies reviewed are outlined and summarized above. Otherwise please see EMR for full report.   Physical Exam:   VS:  BP 102/60 (BP Location: Right Arm, Cuff Size: Normal)   Pulse 91   Ht 5' 1 (1.549 m)   Wt 188 lb 12.8 oz (85.6 kg)   SpO2 96%   BMI 35.67 kg/m    Wt Readings from Last 3 Encounters:  10/30/24 188 lb 12.8 oz (85.6 kg)  10/18/24 189 lb (85.7 kg)  10/16/24 184 lb 1.4 oz (83.5 kg)    GEN: Well nourished, well developed in no acute distress while sitting in chair.  NECK: No JVD; No carotid bruits CARDIAC: RRR, no murmurs, rubs, gallops RESPIRATORY:  Clear to auscultation without rales, wheezing or rhonchi  ABDOMEN: Soft, non-tender, non-distended EXTREMITIES:  trace edema in bilateral LE and fingers; No deformity   ASSESSMENT AND PLAN: .   Coronary artery disease involving native coronary artery of native heart without angina pectoris Hyperlipidemia LDL goal <70 Denies angina symptoms. No need for further ischemic evaluation at this time.  LDL at goal as below.  Continue ASA 81 mg, Lipitor  40 mg daily, Imdur  60 mg daily, Lopressor  12.5 mg daily, NTG prn   Lipid Panel     Component Value Date/Time   CHOL 119  02/05/2024 0824   TRIG 64 02/05/2024 0824   HDL 60 02/05/2024 0824   CHOLHDL 2.0 02/05/2024 0824   VLDL 13 02/05/2024 0824   LDLCALC 46 02/05/2024 0824     Essential hypertension BP this OV well controlled today: 102/60 Continue on Imdur  60 mg daily, Lopressor  12.5 mg daily Encourage physical activity for 150 minutes per week and heart healthy low sodium diet. Discussed limiting sodium intake to < 2 grams daily.    Palpitations Report ongoing palpitations that has not changed.  Continue Lopressor  12.5 mg BID  Leg edema Report LE edema that is not resolving with lasix  or leg elevation. Trace edema in LE and fingers.  Admits to high sodium diet. Discussed diet is most likely to contributing factor. Encouraged to reduce daily sodium and increase water intake. Suspect steroid use is also contributing factor. Order BNP, BMP, TSH, CBC, ECHO.  Order compression stocking.  Can consider increasing Lasix  based on results. Hesitant to increase now given soft BP. If lasix  is increased in the future then would decrease Imdur  to 30 mg for optimal BP control.   Tobacco use  She smoke 1 PPD but she is  planning to try nicotine  patch. Encouraged cessation.     Dispo: Follow up in 2-3 month.   Signed, Lorette CINDERELLA Kapur, PA-C  "

## 2024-10-30 ENCOUNTER — Ambulatory Visit: Payer: Self-pay | Admitting: Physician Assistant

## 2024-10-30 ENCOUNTER — Other Ambulatory Visit (HOSPITAL_COMMUNITY)
Admission: RE | Admit: 2024-10-30 | Discharge: 2024-10-30 | Disposition: A | Discharge status: Home / Self Care | Source: Ambulatory Visit | Attending: Physician Assistant | Admitting: Physician Assistant

## 2024-10-30 ENCOUNTER — Encounter: Payer: Self-pay | Admitting: Physician Assistant

## 2024-10-30 ENCOUNTER — Ambulatory Visit: Admitting: Physician Assistant

## 2024-10-30 VITALS — BP 102/60 | HR 91 | Ht 61.0 in | Wt 188.8 lb

## 2024-10-30 DIAGNOSIS — I10 Essential (primary) hypertension: Secondary | ICD-10-CM | POA: Diagnosis present

## 2024-10-30 DIAGNOSIS — Z720 Tobacco use: Secondary | ICD-10-CM

## 2024-10-30 DIAGNOSIS — R600 Localized edema: Secondary | ICD-10-CM | POA: Diagnosis not present

## 2024-10-30 DIAGNOSIS — E785 Hyperlipidemia, unspecified: Secondary | ICD-10-CM | POA: Insufficient documentation

## 2024-10-30 DIAGNOSIS — I2510 Atherosclerotic heart disease of native coronary artery without angina pectoris: Secondary | ICD-10-CM

## 2024-10-30 DIAGNOSIS — R002 Palpitations: Secondary | ICD-10-CM | POA: Diagnosis not present

## 2024-10-30 LAB — BASIC METABOLIC PANEL WITH GFR
Anion gap: 13 (ref 5–15)
BUN: 21 mg/dL (ref 8–23)
CO2: 26 mmol/L (ref 22–32)
Calcium: 9.4 mg/dL (ref 8.9–10.3)
Chloride: 102 mmol/L (ref 98–111)
Creatinine, Ser: 1.16 mg/dL — ABNORMAL HIGH (ref 0.44–1.00)
GFR, Estimated: 52 mL/min — ABNORMAL LOW
Glucose, Bld: 178 mg/dL — ABNORMAL HIGH (ref 70–99)
Potassium: 4.8 mmol/L (ref 3.5–5.1)
Sodium: 141 mmol/L (ref 135–145)

## 2024-10-30 LAB — PRO BRAIN NATRIURETIC PEPTIDE: Pro Brain Natriuretic Peptide: 57.2 pg/mL

## 2024-10-30 LAB — CBC
HCT: 51.7 % — ABNORMAL HIGH (ref 36.0–46.0)
Hemoglobin: 16.7 g/dL — ABNORMAL HIGH (ref 12.0–15.0)
MCH: 31 pg (ref 26.0–34.0)
MCHC: 32.3 g/dL (ref 30.0–36.0)
MCV: 96.1 fL (ref 80.0–100.0)
Platelets: 246 K/uL (ref 150–400)
RBC: 5.38 MIL/uL — ABNORMAL HIGH (ref 3.87–5.11)
RDW: 13.3 % (ref 11.5–15.5)
WBC: 11.9 K/uL — ABNORMAL HIGH (ref 4.0–10.5)
nRBC: 0 % (ref 0.0–0.2)

## 2024-10-30 LAB — TSH: TSH: 1.05 u[IU]/mL (ref 0.350–4.500)

## 2024-10-30 NOTE — Patient Instructions (Addendum)
 Medication Instructions:   Your physician recommends that you continue on your current medications as directed. Please refer to the Current Medication list given to you today.   Labwork: BNP, BMET, CBC,TSH today  Testing/Procedures: Your physician has requested that you have an echocardiogram. Echocardiography is a painless test that uses sound waves to create images of your heart. It provides your doctor with information about the size and shape of your heart and how well your hearts chambers and valves are working. This procedure takes approximately one hour. There are no restrictions for this procedure. Please do NOT wear cologne, perfume, aftershave, or lotions (deodorant is allowed). Please arrive 15 minutes prior to your appointment time.  Please note: We ask at that you not bring children with you during ultrasound (echo/ vascular) testing. Due to room size and safety concerns, children are not allowed in the ultrasound rooms during exams. Our front office staff cannot provide observation of children in our lobby area while testing is being conducted. An adult accompanying a patient to their appointment will only be allowed in the ultrasound room at the discretion of the ultrasound technician under special circumstances. We apologize for any inconvenience.   Follow-Up: 2-3 months  Any Other Special Instructions Will Be Listed Below (If Applicable).     Take prescription to [facility] to get Compression Stockings  If you need a refill on your cardiac medications before your next appointment, please call your pharmacy.

## 2024-10-31 ENCOUNTER — Other Ambulatory Visit: Payer: Self-pay

## 2024-10-31 DIAGNOSIS — F410 Panic disorder [episodic paroxysmal anxiety] without agoraphobia: Secondary | ICD-10-CM

## 2024-11-01 MED ORDER — FUROSEMIDE 20 MG PO TABS: 10.0000 mg | ORAL_TABLET | Freq: Every day | ORAL | 3 refills | Status: DC

## 2024-11-01 NOTE — Telephone Encounter (Signed)
 Katelyn Lorette GRADE, PA-C to [employer] Triage     10/30/24  4:02 PM Result Note Please notify patient: Electrolytes (Sodium and Potassium) are normal. Thyroid is normal. Blood count is stable and similar to previous results. proBNP (fluid overload enzyme) is normal, which mean less likely heart failure is contributing to swelling. Kidney function is stable but just mildly elevated above normal.  Would recommend reducing Lasix  to 10 mg daily. Increase water intake and decrease sodium intake.   TSH; Basic metabolic panel with GFR; Pro Brain natriuretic peptide; CB    [name] to [facility] Triage (supporting Lorette Kelly Sheron, PA-C) (Selected Message)     11/01/24  8:03 AM Thank Rosine For Explaining the Results To Me, I Will Need A Refill On Lasix , Due To It Being A Capsule And Not A Tablet, Do I Still Need The Other Test Done ? Again Thank You

## 2024-11-06 ENCOUNTER — Ambulatory Visit: Payer: Self-pay | Admitting: Physician Assistant

## 2024-11-06 ENCOUNTER — Ambulatory Visit (HOSPITAL_COMMUNITY)
Admission: RE | Admit: 2024-11-06 | Discharge: 2024-11-06 | Disposition: A | Discharge status: Home / Self Care | Source: Ambulatory Visit | Attending: Physician Assistant | Admitting: Physician Assistant

## 2024-11-06 ENCOUNTER — Encounter: Payer: Self-pay | Admitting: Cardiology

## 2024-11-06 DIAGNOSIS — R600 Localized edema: Secondary | ICD-10-CM | POA: Insufficient documentation

## 2024-11-06 LAB — ECHOCARDIOGRAM COMPLETE
AR max vel: 2.2 cm2
AV Area VTI: 2 cm2
AV Area mean vel: 2.18 cm2
AV Mean grad: 7 mmHg
AV Peak grad: 13.2 mmHg
Ao pk vel: 1.82 m/s
Area-P 1/2: 3.42 cm2
S' Lateral: 2.9 cm

## 2024-11-06 LAB — GLUCOSE, CAPILLARY: Glucose-Capillary: 104 mg/dL — ABNORMAL HIGH (ref 70–99)

## 2024-11-06 NOTE — Progress Notes (Signed)
*  PRELIMINARY RESULTS* Echocardiogram 2D Echocardiogram has been performed. Patient as per their device had a blood sugar of 75, then 73, 71. Patient had some candy and a soft drink just prior. Gave patient some orange juice. Rolland Sake, RN from cardiac rehab did a finger prick and blood sugar was 104. Patient states she feels much better and does not want to go to the E.D.. After echo exam patient discharged home.  Katelyn Kelly 11/06/2024, 3:07 PM

## 2024-11-07 ENCOUNTER — Ambulatory Visit: Admitting: Student

## 2024-11-07 ENCOUNTER — Other Ambulatory Visit: Payer: Self-pay

## 2024-11-07 DIAGNOSIS — R600 Localized edema: Secondary | ICD-10-CM

## 2024-11-07 DIAGNOSIS — Z79899 Other long term (current) drug therapy: Secondary | ICD-10-CM

## 2024-11-07 MED ORDER — ISOSORBIDE MONONITRATE ER 30 MG PO TB24: 30.0000 mg | ORAL_TABLET | Freq: Every day | ORAL | 3 refills | Status: AC

## 2024-11-07 MED ORDER — FUROSEMIDE 20 MG PO TABS: 20.0000 mg | ORAL_TABLET | Freq: Every day | ORAL | 3 refills | Status: AC

## 2024-11-08 ENCOUNTER — Other Ambulatory Visit: Payer: Self-pay

## 2024-11-08 DIAGNOSIS — Z79899 Other long term (current) drug therapy: Secondary | ICD-10-CM

## 2024-11-08 DIAGNOSIS — R600 Localized edema: Secondary | ICD-10-CM

## 2024-11-12 ENCOUNTER — Ambulatory Visit: Payer: Self-pay

## 2024-11-12 NOTE — Telephone Encounter (Signed)
" °  Reason for Disposition  [1] Caller has URGENT medicine question about med that primary care doctor (or NP/PA) or specialist prescribed AND [2] triager unable to answer question  Answer Assessment - Initial Assessment Questions Please contact pt at listed number to advise alternate and/or if she needs to wean off. Also asking if she should continue pregabalin , will need refill. Pt reports no improvement in pain or depression with this combination.   1. NAME of MEDICINE: What medicine(s) are you calling about?     Buspar  and hydrocodone . Would like to d/c both. States hydrocodone  is not helping her pain, but has appt with surgeon. 2. QUESTION: What is your question? (e.g., double dose of medicine, side effect)     Makes her feel high. Feels like her heart races when she takes it.  3. PRESCRIBER: Who prescribed the medicine? Reason: if prescribed by specialist, call should be referred to that group.      PCP 4. SYMPTOMS: Do you have any symptoms? If Yes, ask: What symptoms are you having?  How bad are the symptoms (e.g., mild, moderate, severe)     Feels high when she takes them  Protocols used: Medication Question Call-A-AH Copied from CRM [id number]. Topic: Clinical - Red Word Triage >> Nov 12, 2024 11:03 AM Richerd A wrote: [facility] that prompted transfer to Nurse Triage: Patient said that is taking Hydrocondone and busPIRone  (BUSPAR ) 7.5 MG tablet which makes her extremely high she cries, heart beats very fast feels off balance "

## 2024-11-13 ENCOUNTER — Other Ambulatory Visit: Payer: Self-pay

## 2024-11-13 ENCOUNTER — Other Ambulatory Visit (HOSPITAL_COMMUNITY): Payer: Self-pay | Admitting: Neurosurgery

## 2024-11-13 DIAGNOSIS — M48062 Spinal stenosis, lumbar region with neurogenic claudication: Secondary | ICD-10-CM

## 2024-11-13 DIAGNOSIS — E118 Type 2 diabetes mellitus with unspecified complications: Secondary | ICD-10-CM

## 2024-11-13 MED ORDER — PREGABALIN 50 MG PO CAPS: 50.0000 mg | ORAL_CAPSULE | Freq: Two times a day (BID) | ORAL | 0 refills | Status: AC

## 2024-11-13 NOTE — Telephone Encounter (Signed)
 Lyrica  sent to [facility]

## 2024-11-14 NOTE — Telephone Encounter (Signed)
 Patient advised.

## 2024-11-20 ENCOUNTER — Ambulatory Visit (HOSPITAL_COMMUNITY)
Admission: RE | Admit: 2024-11-20 | Discharge: 2024-11-20 | Disposition: A | Discharge status: Home / Self Care | Source: Ambulatory Visit | Attending: Neurosurgery | Admitting: Neurosurgery

## 2024-11-20 DIAGNOSIS — M48062 Spinal stenosis, lumbar region with neurogenic claudication: Secondary | ICD-10-CM | POA: Insufficient documentation

## 2024-11-29 ENCOUNTER — Telehealth: Payer: Self-pay

## 2024-11-29 ENCOUNTER — Other Ambulatory Visit: Payer: Self-pay

## 2024-11-29 DIAGNOSIS — F410 Panic disorder [episodic paroxysmal anxiety] without agoraphobia: Secondary | ICD-10-CM

## 2024-11-29 DIAGNOSIS — M5440 Lumbago with sciatica, unspecified side: Secondary | ICD-10-CM

## 2024-11-29 NOTE — Telephone Encounter (Signed)
 Patient called for refill of Methocarbamol . Rx request was denied she had not been seen since August. She has been encouraged to make a follow up appointment here at [facility].

## 2024-12-30 ENCOUNTER — Ambulatory Visit: Admitting: Physician Assistant

## 2025-01-08 ENCOUNTER — Ambulatory Visit: Admitting: Nurse Practitioner

## 2025-09-10 ENCOUNTER — Ambulatory Visit
